# Patient Record
Sex: Male | Born: 1935 | Race: White | Hispanic: No | Marital: Married | Smoking: Former smoker
Health system: Southern US, Community
[De-identification: ages and names within clinical notes are randomized; demographics above are authoritative.]

## PROBLEM LIST (undated history)

## (undated) DIAGNOSIS — R55 Syncope and collapse: Secondary | ICD-10-CM

## (undated) DIAGNOSIS — G7000 Myasthenia gravis without (acute) exacerbation: Secondary | ICD-10-CM

## (undated) DIAGNOSIS — E669 Obesity, unspecified: Secondary | ICD-10-CM

## (undated) DIAGNOSIS — Z9289 Personal history of other medical treatment: Secondary | ICD-10-CM

## (undated) DIAGNOSIS — G4700 Insomnia, unspecified: Secondary | ICD-10-CM

## (undated) DIAGNOSIS — I10 Essential (primary) hypertension: Secondary | ICD-10-CM

## (undated) DIAGNOSIS — E785 Hyperlipidemia, unspecified: Secondary | ICD-10-CM

## (undated) DIAGNOSIS — I4819 Other persistent atrial fibrillation: Secondary | ICD-10-CM

## (undated) DIAGNOSIS — C61 Malignant neoplasm of prostate: Secondary | ICD-10-CM

## (undated) DIAGNOSIS — H409 Unspecified glaucoma: Secondary | ICD-10-CM

## (undated) DIAGNOSIS — I441 Atrioventricular block, second degree: Secondary | ICD-10-CM

## (undated) DIAGNOSIS — K219 Gastro-esophageal reflux disease without esophagitis: Secondary | ICD-10-CM

## (undated) DIAGNOSIS — N182 Chronic kidney disease, stage 2 (mild): Secondary | ICD-10-CM

## (undated) DIAGNOSIS — I5189 Other ill-defined heart diseases: Secondary | ICD-10-CM

## (undated) HISTORY — DX: Insomnia, unspecified: G47.00

## (undated) HISTORY — DX: Malignant neoplasm of prostate: C61

## (undated) HISTORY — DX: Gastro-esophageal reflux disease without esophagitis: K21.9

## (undated) HISTORY — DX: Hyperlipidemia, unspecified: E78.5

## (undated) HISTORY — DX: Chronic kidney disease, stage 2 (mild): N18.2

## (undated) HISTORY — DX: Other ill-defined heart diseases: I51.89

## (undated) HISTORY — DX: Other persistent atrial fibrillation: I48.19

## (undated) HISTORY — DX: Myasthenia gravis without (acute) exacerbation: G70.00

## (undated) HISTORY — DX: Essential (primary) hypertension: I10

## (undated) HISTORY — DX: Unspecified glaucoma: H40.9

## (undated) HISTORY — PX: TRANSURETHRAL RESECTION OF PROSTATE: SHX73

## (undated) HISTORY — PX: CATARACT EXTRACTION: SUR2

## (undated) HISTORY — DX: Obesity, unspecified: E66.9

## (undated) HISTORY — PX: CHOLECYSTECTOMY: SHX55

---

## 2004-05-08 ENCOUNTER — Ambulatory Visit: Payer: Self-pay | Admitting: Neurology

## 2004-06-02 ENCOUNTER — Inpatient Hospital Stay: Payer: Self-pay | Admitting: Neurology

## 2005-03-10 ENCOUNTER — Ambulatory Visit: Payer: Self-pay | Admitting: Neurology

## 2005-11-16 ENCOUNTER — Ambulatory Visit: Payer: Self-pay

## 2006-05-12 ENCOUNTER — Ambulatory Visit: Payer: Self-pay | Admitting: Neurology

## 2007-02-08 ENCOUNTER — Ambulatory Visit: Payer: Self-pay | Admitting: Urology

## 2007-02-10 ENCOUNTER — Ambulatory Visit: Payer: Self-pay | Admitting: Urology

## 2007-03-14 ENCOUNTER — Ambulatory Visit: Payer: Self-pay | Admitting: Urology

## 2007-06-06 ENCOUNTER — Other Ambulatory Visit: Payer: Self-pay

## 2007-06-06 ENCOUNTER — Emergency Department: Payer: Self-pay | Admitting: Emergency Medicine

## 2008-08-31 ENCOUNTER — Emergency Department: Payer: Self-pay | Admitting: Emergency Medicine

## 2009-04-24 ENCOUNTER — Ambulatory Visit: Payer: Self-pay | Admitting: Ophthalmology

## 2009-04-26 ENCOUNTER — Ambulatory Visit: Payer: Self-pay | Admitting: Family Medicine

## 2009-04-30 ENCOUNTER — Ambulatory Visit: Payer: Self-pay | Admitting: Ophthalmology

## 2012-09-08 ENCOUNTER — Encounter: Payer: Self-pay | Admitting: Neurology

## 2012-09-08 ENCOUNTER — Other Ambulatory Visit (INDEPENDENT_AMBULATORY_CARE_PROVIDER_SITE_OTHER): Payer: Self-pay | Admitting: Surgery

## 2012-09-08 ENCOUNTER — Ambulatory Visit (INDEPENDENT_AMBULATORY_CARE_PROVIDER_SITE_OTHER): Payer: Medicare Other | Admitting: Neurology

## 2012-09-08 ENCOUNTER — Encounter (INDEPENDENT_AMBULATORY_CARE_PROVIDER_SITE_OTHER): Payer: Self-pay | Admitting: Surgery

## 2012-09-08 ENCOUNTER — Ambulatory Visit (INDEPENDENT_AMBULATORY_CARE_PROVIDER_SITE_OTHER): Payer: Self-pay | Admitting: Surgery

## 2012-09-08 ENCOUNTER — Ambulatory Visit (INDEPENDENT_AMBULATORY_CARE_PROVIDER_SITE_OTHER): Payer: Medicare Other | Admitting: Surgery

## 2012-09-08 VITALS — BP 176/82 | HR 72 | Temp 97.6°F | Resp 16 | Ht 69.0 in | Wt 204.8 lb

## 2012-09-08 VITALS — BP 166/77 | HR 78 | Ht 66.5 in | Wt 205.0 lb

## 2012-09-08 DIAGNOSIS — R1011 Right upper quadrant pain: Secondary | ICD-10-CM

## 2012-09-08 DIAGNOSIS — G733 Myasthenic syndromes in other diseases classified elsewhere: Secondary | ICD-10-CM

## 2012-09-08 DIAGNOSIS — R1013 Epigastric pain: Secondary | ICD-10-CM

## 2012-09-08 DIAGNOSIS — G7000 Myasthenia gravis without (acute) exacerbation: Secondary | ICD-10-CM

## 2012-09-08 DIAGNOSIS — K859 Acute pancreatitis without necrosis or infection, unspecified: Secondary | ICD-10-CM

## 2012-09-08 HISTORY — DX: Myasthenia gravis without (acute) exacerbation: G70.00

## 2012-09-08 LAB — LIPASE: Lipase: 28 U/L (ref 0–75)

## 2012-09-08 LAB — COMPREHENSIVE METABOLIC PANEL
AST: 21 U/L (ref 0–37)
Albumin: 3.8 g/dL (ref 3.5–5.2)
BUN: 19 mg/dL (ref 6–23)
CO2: 26 mEq/L (ref 19–32)
Calcium: 9.5 mg/dL (ref 8.4–10.5)
Chloride: 105 mEq/L (ref 96–112)
Creat: 1.03 mg/dL (ref 0.50–1.35)
Glucose, Bld: 101 mg/dL — ABNORMAL HIGH (ref 70–99)
Potassium: 4.5 mEq/L (ref 3.5–5.3)

## 2012-09-08 LAB — CBC
MCH: 31.5 pg (ref 26.0–34.0)
MCHC: 33.8 g/dL (ref 30.0–36.0)
Platelets: 309 10*3/uL (ref 150–400)
RDW: 14.9 % (ref 11.5–15.5)

## 2012-09-08 MED ORDER — PREDNISONE 1 MG PO TABS: 4.0000 mg | ORAL_TABLET | Freq: Every day | ORAL | Status: DC

## 2012-09-08 NOTE — Progress Notes (Deleted)
Subjective:     Patient ID: Elijah Jackson, male   DOB: April 17, 1934, 78 y.o.   MRN: [id number]  HPI   Review of Systems     Objective:   Physical Exam     Assessment:     ***    Plan:     ***

## 2012-09-08 NOTE — Progress Notes (Signed)
Patient ID: Elijah Jackson, male   DOB: 1934/12/26, 78 y.o.   MRN: [id number]  Chief Complaint  Patient presents with  . New Evaluation    eval GB    HPI Elijah Jackson is a 78 y.o. male.  Self-referred from ED HPI This is a 78 year old male with myasthenia gravis who was recently on a trip in [place]. He developed severe epigastric abdominal pain. Reportedly, he was hospitalized for 4 days and they were never able to determine the etiology of his pain. We have some limited records from that hospital. Initially he did have a mildly elevated white blood cell count at 12.7. Hemoglobin was normal. Liver function tests were within normal limits. His lipase was measured at 118 which is elevated in our system but apparently at that hospital the higher end of normal is 300. Further lab studies show an amylase elevated at 149. A CT scan showed bilateral inguinal hernias containing only fat but no other abnormalities. Ultrasound showed no stones, wall thickening or pericholecystic fluid. His common bile duct was reportedly normal. The patient was not evaluated by a gastroenterologist during that admission. The pain spontaneously resolved. During the trip home, he did have some milder episodes of epigastric pain. However he has been asymptomatic over the last several weeks. I operated on his wife for her cholecystitis several years ago. She encouraged him to call me for an appointment.  Past Medical History  Diagnosis Date  . Ocular myasthenia gravis   . Obesity   . Hypertension   . Dyslipidemia   . GERD (gastroesophageal reflux disease)   . Prostate cancer   . Glaucoma   . Myasthenia gravis 09/08/2012    Past Surgical History  Procedure Laterality Date  . Transurethral resection of prostate    . Cataract extraction Left     Family History  Problem Relation Age of Onset  . Heart attack Father     Social History History  Substance Use Topics  . Smoking status: Former Smoker    Quit date:  04/06/1970  . Smokeless tobacco: Not on file  . Alcohol Use: Yes     Comment: Consumes alcohol on occasion    No Known Allergies  Current Outpatient Prescriptions  Medication Sig Dispense Refill  . aspirin 81 MG tablet Take 81 mg by mouth daily.      . Cholecalciferol (VITAMIN D-3) 1000 UNITS CAPS Take 1,000 Units by mouth daily.      Marland Kitchen latanoprost (XALATAN) 0.005 % ophthalmic solution Place 1 drop into both eyes at bedtime.      Marland Kitchen lisinopril (PRINIVIL,ZESTRIL) 20 MG tablet Take 20 mg by mouth daily.      . Multiple Vitamins-Minerals (CENTRUM SILVER ADULT 50+ PO) Take 1 tablet by mouth daily.      Marland Kitchen omeprazole (PRILOSEC) 20 MG capsule Take 20 mg by mouth daily.      . predniSONE (DELTASONE) 1 MG tablet Take 4 tablets (4 mg total) by mouth daily.  120 tablet  3  . rosuvastatin (CRESTOR) 5 MG tablet Take 5 mg by mouth daily.      Marland Kitchen zolpidem (AMBIEN) 10 MG tablet Take 10 mg by mouth at bedtime as needed for sleep.       No current facility-administered medications for this visit.    Review of Systems Review of Systems  Constitutional: Negative for fever, chills and unexpected weight change.  HENT: Negative for hearing loss, congestion, sore throat, trouble swallowing and voice change.   Eyes: Negative for  visual disturbance.  Respiratory: Negative for cough and wheezing.   Cardiovascular: Negative for chest pain, palpitations and leg swelling.  Gastrointestinal: Positive for nausea, abdominal pain, constipation and abdominal distention. Negative for vomiting, diarrhea, blood in stool, anal bleeding and rectal pain.  Genitourinary: Negative for hematuria and difficulty urinating.  Musculoskeletal: Negative for arthralgias.  Skin: Negative for rash and wound.  Neurological: Negative for seizures, syncope, weakness and headaches.  Hematological: Negative for adenopathy. Does not bruise/bleed easily.  Psychiatric/Behavioral: Negative for confusion.    Blood pressure 176/82, pulse 72,  temperature 97.6 F (36.4 C), temperature source Temporal, resp. rate 16, height 5\' 9"  (1.753 m), weight 204 lb 12.8 oz (92.897 kg).  Physical Exam Physical Exam WDWN in NAD HEENT:  EOMI, sclera anicteric Neck:  No masses, no thyromegaly Lungs:  CTA bilaterally; normal respiratory effort CV:  Regular rate and rhythm; no murmurs Abd:  +bowel sounds, soft, non-tender, no masses Ext:  Well-perfused; no edema Skin:  Warm, dry; no sign of jaundice  Data Reviewed Labs and imaging from [place] - will scan into EPIC  Assessment    Possible biliary pancreatitis - work-up is incomplete.     Plan    HIDA scan with EF to evaluate gallbladder and CBD emptying.  If unremarkable, will consider GI referral.  If positive, will proceed with laparoscopic cholecystectomy with intraoperative cholangiogram.  Reevaluate patient after HIDA scan and labs        Shanyn Preisler K. 09/08/2012, 5:56 PM

## 2012-09-08 NOTE — Patient Instructions (Signed)
Taper the prednisone by one tablet every month until off.

## 2012-09-08 NOTE — Progress Notes (Signed)
   Reason for visit: Ocular myasthenia gravis  Elijah Jackson is an 78 y.o. male  History of present illness:  Elijah Jackson is a 46 are old right-handed white male with a history ocular myasthenia gravis that dates back about 6 years. The patient done quite well on low-dose prednisone. The patient has been on 5 mg daily, and he reports that he recently was traveling in [place], and had to be hospitalized 2 weeks ago with severe diarrhea. The patient reports some problems with leg fatigue since that time. There is some indication that the fatigue may have been present before he got sick. The patient denies any ptosis, or double vision. The patient denies any problems with chewing or swallowing. The patient returns to this office for an evaluation.  Past Medical History  Diagnosis Date  . Ocular myasthenia gravis   . Obesity   . Hypertension   . Dyslipidemia   . GERD (gastroesophageal reflux disease)   . Prostate cancer   . Glaucoma   . Myasthenia gravis 09/08/2012    Past Surgical History  Procedure Laterality Date  . Transurethral resection of prostate    . Cataract extraction Left     Family History  Problem Relation Age of Onset  . Heart attack Father     Social history:  reports that he quit smoking about 42 years ago. He does not have any smokeless tobacco history on file. He reports that  drinks alcohol. He reports that he does not use illicit drugs.  Allergies: No Known Allergies  Medications:  No current outpatient prescriptions on file prior to visit.   No current facility-administered medications on file prior to visit.    ROS:  Out of a complete 14 system review of symptoms, the patient complains only of the following symptoms, and all other reviewed systems are negative.  Swelling the legs Fatigue  Blood pressure 166/77, pulse 78, height 5' 6.5" (1.689 m), weight 205 lb (92.987 kg).  Physical Exam  General: The patient is alert and cooperative at the time  of the examination. The patient is moderately obese.  Skin: 2+ edema at the ankles is noted bilaterally.   Neurologic Exam  Cranial nerves: Facial symmetry is present. Speech is normal, no aphasia or dysarthria is noted. Extraocular movements are full. Visual fields are full. The patient has no increased ptosis with superior gaze for 1 minute. No double vision is noted.  Motor: The patient has good strength in all 4 extremities. The patient has no fatigable weakness of the deltoid muscles with arms outstretched for 1 minute.  Coordination: The patient has good finger-nose-finger and heel-to-shin bilaterally.  Gait and station: The patient has a normal gait. Tandem gait is normal. Romberg is negative. No drift is seen.  Reflexes: Deep tendon reflexes are symmetric.   Assessment/Plan:  One. Ocular myasthenia gravis  The patient appears to be doing quite well at this time. We will continue the prednisone taper by 1 mg every month until he is off the medication. A prescription was given for the 1 mg prednisone tablets. The patient will followup in 6 months. If he believes that he is clinically getting worse, he is to contact our office before I see him back.  Elijah Palau MD 09/08/2012 8:16 PM  [facility] [address] [place], [place] [address]  Phone [phone] Fax [phone]

## 2012-09-09 ENCOUNTER — Encounter (INDEPENDENT_AMBULATORY_CARE_PROVIDER_SITE_OTHER): Payer: Self-pay

## 2012-09-20 ENCOUNTER — Telehealth: Payer: Self-pay | Admitting: *Deleted

## 2012-09-20 NOTE — Telephone Encounter (Signed)
I called patient. The patient indicates that a drop from 5 mg daily to 4 mg daily of prednisone has resulted in significant weakness of the lower extremities associated with numbness and tingling. The patient went back up on the prednisone taking 5 mg daily, and he now feels well. The patient will stay on the 5 mg dosing.

## 2012-09-20 NOTE — Telephone Encounter (Signed)
Message copied by Avie Echevaria on Tue Sep 20, 2012  2:33 PM ------      Message from: Anice Paganini      Created: Tue Sep 20, 2012  8:27 AM      Contact: Pt Elijah Jackson       Pt called needs Dr. Anne Hahn or nurse call him back about his medication (dosage) predniSONE (DELTASONE) 1 MG tablet                ------

## 2012-09-20 NOTE — Telephone Encounter (Signed)
Patient stated that he became extremely weak ("unable to pick myself up off the floor") on his increased dosage of Prednisone.  He is no longer taking 4 tablets daily and has gone back to 1 tablet daily.  He states that he feels much better now.  Please advise.

## 2012-09-20 NOTE — Telephone Encounter (Signed)
Returned patient's call regarding Prednisone.  Voice message left requesting a call-back.

## 2012-09-27 ENCOUNTER — Encounter (INDEPENDENT_AMBULATORY_CARE_PROVIDER_SITE_OTHER): Payer: Self-pay | Admitting: Surgery

## 2012-09-27 NOTE — Addendum Note (Signed)
Addended by: Marlowe Aschoff on: 09/27/2012 10:28 AM   Modules accepted: Orders

## 2012-09-28 ENCOUNTER — Telehealth (INDEPENDENT_AMBULATORY_CARE_PROVIDER_SITE_OTHER): Payer: Self-pay

## 2012-09-28 NOTE — Telephone Encounter (Signed)
LMOM asking that pt return my call before the end of the day - pt scheduled for hepato scan tomorrow at 7am. His insurance had been declined.  appt canceled. [facility] is appealing and will get appt rescheduled.

## 2012-09-29 ENCOUNTER — Encounter (HOSPITAL_COMMUNITY): Payer: Medicare Other

## 2012-10-06 ENCOUNTER — Telehealth (INDEPENDENT_AMBULATORY_CARE_PROVIDER_SITE_OTHER): Payer: Self-pay | Admitting: General Surgery

## 2012-10-06 NOTE — Telephone Encounter (Signed)
Patient's wife calling to check on the status of the appeal for patient's HIDA scan. Please advise. Wife can be reached back at [phone] or husband at [phone].

## 2012-10-10 ENCOUNTER — Encounter (INDEPENDENT_AMBULATORY_CARE_PROVIDER_SITE_OTHER): Payer: Self-pay | Admitting: Surgery

## 2012-10-10 ENCOUNTER — Other Ambulatory Visit (INDEPENDENT_AMBULATORY_CARE_PROVIDER_SITE_OTHER): Payer: Self-pay | Admitting: Surgery

## 2012-10-10 ENCOUNTER — Ambulatory Visit (INDEPENDENT_AMBULATORY_CARE_PROVIDER_SITE_OTHER): Payer: Medicare Other | Admitting: Surgery

## 2012-10-10 ENCOUNTER — Encounter (INDEPENDENT_AMBULATORY_CARE_PROVIDER_SITE_OTHER): Payer: Medicare Other | Admitting: Surgery

## 2012-10-10 VITALS — BP 127/83 | HR 68 | Temp 97.7°F | Resp 16 | Ht 69.0 in | Wt 202.0 lb

## 2012-10-10 DIAGNOSIS — R1011 Right upper quadrant pain: Secondary | ICD-10-CM

## 2012-10-10 DIAGNOSIS — R1013 Epigastric pain: Secondary | ICD-10-CM

## 2012-10-10 NOTE — Progress Notes (Signed)
The patient has not had any other severe attacks of epigastric or right upper quadrant abdominal pain. However he continues to have postprandial diarrhea and noticeable abdominal bloating. This seems to get worse with dairy products or whenever he goes out to eat.  Up to this point, we have been blocked by the insurance company in our efforts to schedule his HIDA scan.  His symptoms certainly fit the profile for gallbladder disease and his ultrasound from [place] was normal. We requested the HIDA scan in order to determine whether he had delayed gallbladder emptying with a decreased gallbladder ejection fraction.  We have made multiple attempts to try to appeal this to [place] [employer]. However to this point, they have denied approval for the HIDA scan. We will continue to try to appeal this decision. The other option would be to proceed with laparoscopic cholecystectomy.  Since the patient's symptoms are manageable at this time, I think it would be more prudent to reappeal [employer] company denial. He will try to maintain a low-fat diet. We will contact him after we have spoken with the insurance company again.  Elijah Arms. Corliss Skains, MD, [facility]  General/ Trauma Surgery  10/10/2012 11:26 AM

## 2012-10-19 ENCOUNTER — Telehealth (INDEPENDENT_AMBULATORY_CARE_PROVIDER_SITE_OTHER): Payer: Self-pay | Admitting: General Surgery

## 2012-10-19 ENCOUNTER — Encounter (HOSPITAL_COMMUNITY)
Admission: RE | Admit: 2012-10-19 | Discharge: 2012-10-19 | Disposition: A | Discharge status: Home / Self Care | Payer: Medicare Other | Source: Ambulatory Visit | Attending: Surgery | Admitting: Surgery

## 2012-10-19 DIAGNOSIS — R1013 Epigastric pain: Secondary | ICD-10-CM | POA: Insufficient documentation

## 2012-10-19 MED ORDER — TECHNETIUM TC 99M MEBROFENIN IV KIT
5.0000 | PACK | Freq: Once | INTRAVENOUS | Status: AC | PRN
  Administered 2012-10-19: 5 via INTRAVENOUS

## 2012-10-19 NOTE — Progress Notes (Signed)
Quick Note:  Please call the patient and let them know that their HIDA scan showed a gallbladder ejection fraction of 41%. Normal is 30%. Offer him an appointment in 2 weeks for reevaluation. We may still consider gallbladder surgery.  ______

## 2012-10-19 NOTE — Telephone Encounter (Signed)
LMOM to let patient that his hida scan showed a gallbladder ejection fraction of 41% normal is 30%. We may consider gallbladder surgery

## 2012-10-25 ENCOUNTER — Ambulatory Visit (INDEPENDENT_AMBULATORY_CARE_PROVIDER_SITE_OTHER): Payer: Medicare Other | Admitting: Surgery

## 2012-10-25 ENCOUNTER — Encounter (INDEPENDENT_AMBULATORY_CARE_PROVIDER_SITE_OTHER): Payer: Self-pay | Admitting: Surgery

## 2012-10-25 VITALS — BP 158/78 | HR 64 | Temp 98.0°F | Resp 16 | Ht 69.0 in | Wt 207.4 lb

## 2012-10-25 DIAGNOSIS — R1011 Right upper quadrant pain: Secondary | ICD-10-CM

## 2012-10-25 NOTE — Progress Notes (Signed)
The patient had his HIDA scan on 7/16.  This showed a gallbladder ejection fraction at 41% after drinking 8 ounces of Ensure. He did not have any recurrence of the symptoms with the oral intake. Overall he feels better. He denies any abdominal distention. He gets occasional twinges of right upper quadrant abdominal pain but this is not really related to food. His bowel movements have returned to normal. Overall he feels better than when he had his initial incident on vacation in [place].  Due to his normal findings on ultrasound and hydroscan, we will defer any surgery at this time. I would like to reevaluate him in 2 months to see if there's been any recurrence of symptoms.  He will call us sooner if his symptoms return or worsen.  Wilmon Arms. Corliss Skains, MD, [facility]  General/ Trauma Surgery  10/25/2012 10:53 AM

## 2012-12-20 ENCOUNTER — Other Ambulatory Visit: Payer: Self-pay

## 2012-12-20 NOTE — Telephone Encounter (Signed)
Dr Willis is out of the office, forwarding request to Dr Athar WID.  

## 2012-12-26 ENCOUNTER — Telehealth: Payer: Self-pay | Admitting: Neurology

## 2012-12-26 MED ORDER — TIZANIDINE HCL 2 MG PO TABS: 2.0000 mg | ORAL_TABLET | Freq: Four times a day (QID) | ORAL | Status: DC | PRN

## 2012-12-26 NOTE — Telephone Encounter (Signed)
Return pt call about having headaches and not being able to receive his sleep medication. Explain to the pt that Dr. Anne Hahn had not seen him for headaches and that he needed to follow up with his PCP. Pt stated that he did not have one and that Dr. Anne Hahn has filled his sleep medication before. Told the pt that i would inform Dr. Anne Hahn about his concerns.

## 2012-12-26 NOTE — Telephone Encounter (Signed)
I called patient. The patient is having some headaches in the left occipital area, left neck area. 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 The patient notes crepitus when turning his head to the right. The patient sees a chiropractor, and treatments will make the headache go away for about a day. The patient does occasionally have neck stiffness. The patient will be given a trial on tizanidine, and he will see his chiropractor on a regular basis, possibly considering cervical traction. If the headaches do not improve, the patient will contact me.

## 2012-12-27 ENCOUNTER — Other Ambulatory Visit: Payer: Self-pay

## 2012-12-27 MED ORDER — ZOLPIDEM TARTRATE 10 MG PO TABS: 10.0000 mg | ORAL_TABLET | Freq: Every evening | ORAL | Status: DC | PRN

## 2012-12-27 NOTE — Telephone Encounter (Signed)
Rx signed and faxed.

## 2012-12-29 ENCOUNTER — Encounter (INDEPENDENT_AMBULATORY_CARE_PROVIDER_SITE_OTHER): Payer: Self-pay | Admitting: Surgery

## 2012-12-29 ENCOUNTER — Ambulatory Visit (INDEPENDENT_AMBULATORY_CARE_PROVIDER_SITE_OTHER): Payer: Medicare Other | Admitting: Surgery

## 2012-12-29 VITALS — BP 154/84 | HR 65 | Temp 98.0°F | Resp 18 | Ht 69.0 in | Wt 205.0 lb

## 2012-12-29 DIAGNOSIS — K859 Acute pancreatitis without necrosis or infection, unspecified: Secondary | ICD-10-CM

## 2012-12-29 DIAGNOSIS — R1011 Right upper quadrant pain: Secondary | ICD-10-CM

## 2012-12-29 NOTE — Progress Notes (Signed)
Patient ID: Elijah Jackson, male   DOB: 02/11/1935, 78 y.o.   MRN: [id number]  Chief Complaint  Patient presents with  . Routine Post Op    G.B    HPI Elijah Jackson is a 78 y.o. male.    HPI This is a 78 year old male with myasthenia gravis who was recently on a trip in [place]. He developed severe epigastric abdominal pain. Reportedly, he was hospitalized for 4 days and they were never able to determine the etiology of his pain. We have some limited records from that hospital. Initially he did have a mildly elevated white blood cell count at 12.7. Hemoglobin was normal. Liver function tests were within normal limits. His lipase was measured at 118 which is elevated in our system but apparently at that hospital the higher end of normal is 300. Further lab studies show an amylase elevated at 149. A CT scan showed bilateral inguinal hernias containing only fat but no other abnormalities. Ultrasound showed no stones, wall thickening or pericholecystic fluid. His common bile duct was reportedly normal. The patient was not evaluated by a gastroenterologist during that admission. The pain spontaneously resolved. During the trip home, he did have some milder episodes of epigastric pain. However he has been asymptomatic over the last several weeks. I operated on his wife for her cholecystitis several years ago.  The patient had his HIDA scan on 7/16. This showed a gallbladder ejection fraction at 41% after drinking 8 ounces of Ensure. He did not have any recurrence of the symptoms with the oral intake. Overall he feels better. He denies any abdominal distention. He gets occasional twinges of right upper quadrant abdominal pain but this is not really related to food. His bowel movements have returned to normal. Overall he feels better than when he had his initial incident on vacation in [place].   Over the last couple of months, he has been having some mild symptoms of RUQ pain, especially after eating peanut  butter.  Mild nausea, no vomiting.  No diarrhea.   Past Medical History  Diagnosis Date  . Ocular myasthenia gravis   . Obesity   . Hypertension   . Dyslipidemia   . GERD (gastroesophageal reflux disease)   . Prostate cancer   . Glaucoma   . Myasthenia gravis 09/08/2012    Past Surgical History  Procedure Laterality Date  . Transurethral resection of prostate    . Cataract extraction Left     Family History  Problem Relation Age of Onset  . Heart attack Father     Social History History  Substance Use Topics  . Smoking status: Former Smoker    Quit date: 04/06/1970  . Smokeless tobacco: Not on file  . Alcohol Use: Yes     Comment: Consumes alcohol on occasion    No Known Allergies  Current Outpatient Prescriptions  Medication Sig Dispense Refill  . aspirin 81 MG tablet Take 81 mg by mouth daily.      . Cholecalciferol (VITAMIN D-3) 1000 UNITS CAPS Take 1,000 Units by mouth daily.      Marland Kitchen latanoprost (XALATAN) 0.005 % ophthalmic solution Place 1 drop into both eyes at bedtime.      Marland Kitchen lisinopril (PRINIVIL,ZESTRIL) 20 MG tablet Take 20 mg by mouth daily.      . Multiple Vitamins-Minerals (CENTRUM SILVER ADULT 50+ PO) Take 1 tablet by mouth daily.      Marland Kitchen omeprazole (PRILOSEC) 20 MG capsule Take 20 mg by mouth daily.      Marland Kitchen  predniSONE (DELTASONE) 5 MG tablet Take 5 mg by mouth daily.      . rosuvastatin (CRESTOR) 5 MG tablet Take 5 mg by mouth daily.      Marland Kitchen tiZANidine (ZANAFLEX) 2 MG tablet Take 1 tablet (2 mg total) by mouth every 6 (six) hours as needed.  60 tablet  1  . zolpidem (AMBIEN) 10 MG tablet Take 1 tablet (10 mg total) by mouth at bedtime as needed for sleep.  30 tablet  3   No current facility-administered medications for this visit.    Review of Systems Review of Systems  Constitutional: Negative for fever, chills and unexpected weight change.  HENT: Negative for hearing loss, congestion, sore throat, trouble swallowing and voice change.   Eyes:  Negative for visual disturbance.  Respiratory: Negative for cough and wheezing.   Cardiovascular: Negative for chest pain, palpitations and leg swelling.  Gastrointestinal: Positive for nausea, abdominal pain and abdominal distention. Negative for vomiting, diarrhea, constipation, blood in stool, anal bleeding and rectal pain.  Genitourinary: Negative for hematuria and difficulty urinating.  Musculoskeletal: Negative for arthralgias.  Skin: Negative for rash and wound.  Neurological: Negative for seizures, syncope, weakness and headaches.  Hematological: Negative for adenopathy. Does not bruise/bleed easily.  Psychiatric/Behavioral: Negative for confusion.    Blood pressure 154/84, pulse 65, temperature 98 F (36.7 C), resp. rate 18, height 5\' 9"  (1.753 m), weight 205 lb (92.987 kg).  Physical Exam Physical Exam WDWN in NAD HEENT:  EOMI, sclera anicteric Neck:  No masses, no thyromegaly Lungs:  CTA bilaterally; normal respiratory effort CV:  Regular rate and rhythm; no murmurs Abd:  +bowel sounds, soft, mild RUQ tenderness Ext:  Well-perfused; no edema Skin:  Warm, dry; no sign of jaundice  Data Reviewed RADIOLOGY REPORT*  Clinical Data: Epigastric abdominal pain.  NUCLEAR MEDICINE HEPATOBILIARY IMAGING WITH GALLBLADDER EF  Technique: Sequential images of the abdomen were obtained out to  60 minutes following intravenous administration of  radiopharmaceutical. After oral ingestion of 8 ounces of Ensure  plus, gallbladder ejection fraction was determined.  Radiopharmaceutical: 5.0 mCi Tc-23m Choletec  Comparison: None  Findings: There is symmetric uptake in the liver and prompt  excretion into the biliary tree which is visualized by 10 minutes.  The gallbladder is visualized at . Activity is noted in  the small bowel at 45 minutes.  The patient received 8 ounces of Ensure plus and a gallbladder  ejection fraction was estimated at 41.4. Normal is greater than  30%.   The patient did not experience symptoms after oral ingestion of  Ensure plus.  IMPRESSION:  1. Normal biliary patency study.  2. Low normal gallbladder ejection fraction.  Original Report Authenticated By: Rudie Meyer, M.D.   Assessment    Chronic cholecystitis - due to the patient's recurring symptoms and his history of severe pancreatitis, recommend surgery.     Plan    Laparoscopic cholecystectomy with intraoperative cholangiogram. The surgical procedure has been discussed with the patient.  Potential risks, benefits, alternative treatments, and expected outcomes have been explained.  All of the patient's questions at this time have been answered.  The likelihood of reaching the patient's treatment goal is good.  The patient understand the proposed surgical procedure and wishes to proceed.         Elijah Jackson K. 12/29/2012, 1:41 PM

## 2013-01-05 ENCOUNTER — Ambulatory Visit: Payer: Self-pay | Admitting: Ophthalmology

## 2013-01-09 ENCOUNTER — Other Ambulatory Visit (INDEPENDENT_AMBULATORY_CARE_PROVIDER_SITE_OTHER): Payer: Self-pay | Admitting: Surgery

## 2013-01-09 ENCOUNTER — Other Ambulatory Visit (INDEPENDENT_AMBULATORY_CARE_PROVIDER_SITE_OTHER): Payer: Self-pay | Admitting: *Deleted

## 2013-01-09 DIAGNOSIS — K811 Chronic cholecystitis: Secondary | ICD-10-CM

## 2013-01-09 MED ORDER — HYDROCODONE-ACETAMINOPHEN 5-325 MG PO TABS: 1.0000 | ORAL_TABLET | ORAL | Status: DC | PRN

## 2013-01-18 ENCOUNTER — Ambulatory Visit: Payer: Self-pay | Admitting: Ophthalmology

## 2013-01-25 ENCOUNTER — Ambulatory Visit (INDEPENDENT_AMBULATORY_CARE_PROVIDER_SITE_OTHER): Payer: Medicare Other | Admitting: Surgery

## 2013-01-25 ENCOUNTER — Encounter (INDEPENDENT_AMBULATORY_CARE_PROVIDER_SITE_OTHER): Payer: Self-pay | Admitting: Surgery

## 2013-01-25 VITALS — BP 130/78 | HR 74 | Temp 97.6°F | Resp 16 | Ht 69.0 in | Wt 201.6 lb

## 2013-01-25 DIAGNOSIS — K811 Chronic cholecystitis: Secondary | ICD-10-CM | POA: Insufficient documentation

## 2013-01-25 NOTE — Progress Notes (Signed)
Status post laparoscopic cholecystectomy with intraoperative cholangiogram on 01/09/13. The pathology confirmed chronic cholecystitis. The patient is doing fairly low. He does have occasional diarrhea but this seems to be improving. No nausea. Appetite has returned to normal.  Filed Vitals:   01/25/13 1544  BP: 130/78  Pulse: 74  Temp: 97.6 F (36.4 C)  Resp: 16     His incisions are all well-healed with no sign of infection. No abnormal tenderness. No sign of hernia.  The patient may resume regular diet and full activity. Followup as needed.  Wilmon Arms. Corliss Skains, MD, [facility]  General/ Trauma Surgery  01/25/2013 4:47 PM

## 2013-03-21 ENCOUNTER — Encounter: Payer: Self-pay | Admitting: Neurology

## 2013-03-21 ENCOUNTER — Ambulatory Visit (INDEPENDENT_AMBULATORY_CARE_PROVIDER_SITE_OTHER): Payer: Medicare Other | Admitting: Neurology

## 2013-03-21 ENCOUNTER — Encounter (INDEPENDENT_AMBULATORY_CARE_PROVIDER_SITE_OTHER): Payer: Self-pay

## 2013-03-21 VITALS — BP 160/80 | HR 70 | Ht 68.0 in | Wt 204.0 lb

## 2013-03-21 DIAGNOSIS — G7000 Myasthenia gravis without (acute) exacerbation: Secondary | ICD-10-CM

## 2013-03-21 NOTE — Patient Instructions (Signed)
Myasthenia Gravis Myasthenia gravis is a disease that causes muscle weakness throughout the body. The muscles affected are the ones we can control (voluntary muscles). An example of a voluntary muscle is your hand muscles. You can control the muscles to make the hand pick something up. An example of an involuntary muscle is the heart. The heart beats without any direction from you.  Myasthenia Gravis is thought to be an autoimmune disease. That means that normal defenses of the body begin to attack the body. In this case, the immune system begins to attack cells located at the junctions of the muscles and the nerves. Women are affected more often. Women are affected at a younger age than men. Babies born to affected women frequently develop symptoms at an early age. SYMPTOMS Initially in the disease, the facial muscles are affected first. After this, a person may develop droopy eyelids. They may have difficulty controlling facial muscles. They may have problems chewing. Swallowing and speaking may become impaired. The weakness gradually spreads to the arms and legs. It begins to affect breathing. Sometimes, the symptoms lessen or go away without any apparent cause. DIAGNOSIS  Diagnosis can be made with blood tests. Tests such as electromyography may be done to examine the electrical activity in the muscle. An improvement in symptoms after having an anti-cholinesterase drug helps confirm the diagnosis.  TREATMENT  Medicines are usually prescribed as the first treatment. These medicines help, but they do not cure the disease. A plasma cleansing procedure (plasmapheresis) can be used to treat a crisis. It can also be used to prepare a person for surgery. This procedure produces short-term improvement. Some cases are helped by removing the thymus gland. Steroids are used for short-term benefits. Document Released: 06/29/2000 Document Revised: 06/15/2011 Document Reviewed: 03/23/2005 [employer] Patient  Information 2014 [employer].  

## 2013-03-21 NOTE — Progress Notes (Signed)
Reason for visit: Myasthenia gravis  Elijah Jackson is an 78 y.o. male  History of present illness:  Elijah Jackson is a 78 year old right-handed white male with a history of myasthenia gravis primarily ocular features. The patient was to taper down on his prednisone, but at 4 mg a day, he began having some weakness of the left leg, and tingling down the left leg. The patient went back on the 5 milligram dose of the medication, and he has felt better. The patient still has some sensation of weakness and fatigue of the left leg. The patient denies any back pain. Since last seen, the patient has had a gallbladder resection. The patient denies any problems with chewing, swallowing, or breathing. The patient denies any double vision problems or ptosis. The patient returns to the office today for an evaluation. The patient does report some right-sided neck pain, occasional cervicogenic headache.  Past Medical History  Diagnosis Date  . Ocular myasthenia gravis   . Obesity   . Hypertension   . Dyslipidemia   . GERD (gastroesophageal reflux disease)   . Prostate cancer   . Glaucoma   . Myasthenia gravis 09/08/2012    Past Surgical History  Procedure Laterality Date  . Transurethral resection of prostate    . Cataract extraction Bilateral   . Cholecystectomy      Family History  Problem Relation Age of Onset  . Heart attack Father     Social history:  reports that he quit smoking about 42 years ago. He has never used smokeless tobacco. He reports that he drinks alcohol. He reports that he does not use illicit drugs.   No Known Allergies  Medications:  Current Outpatient Prescriptions on File Prior to Visit  Medication Sig Dispense Refill  . aspirin 81 MG tablet Take 81 mg by mouth daily.      . Cholecalciferol (VITAMIN D-3) 1000 UNITS CAPS Take 1,000 Units by mouth daily.      Marland Kitchen latanoprost (XALATAN) 0.005 % ophthalmic solution Place 1 drop into both eyes at bedtime.      Marland Kitchen  lisinopril (PRINIVIL,ZESTRIL) 20 MG tablet Take 20 mg by mouth daily.      . Multiple Vitamins-Minerals (CENTRUM SILVER ADULT 50+ PO) Take 1 tablet by mouth daily.      Marland Kitchen omeprazole (PRILOSEC) 20 MG capsule Take 20 mg by mouth daily.      . predniSONE (DELTASONE) 5 MG tablet Take 5 mg by mouth daily.      . rosuvastatin (CRESTOR) 5 MG tablet Take 5 mg by mouth daily.      Marland Kitchen tiZANidine (ZANAFLEX) 2 MG tablet Take 1 tablet (2 mg total) by mouth every 6 (six) hours as needed.  60 tablet  1  . zolpidem (AMBIEN) 10 MG tablet Take 1 tablet (10 mg total) by mouth at bedtime as needed for sleep.  30 tablet  3   No current facility-administered medications on file prior to visit.    ROS:  Out of a complete 14 system review of symptoms, the patient complains only of the following symptoms, and all other reviewed systems are negative.  Neck pain Left leg weakness  Blood pressure 160/80, pulse 70, height 5\' 8"  (1.727 m), weight 204 lb (92.534 kg).  Physical Exam  General: The patient is alert and cooperative at the time of the examination. The patient is minimally obese.  Skin: No significant peripheral edema is noted.   Neurologic Exam  Mental status: The patient is  oriented x 3.  Cranial nerves: Facial symmetry is present. Speech is normal, no aphasia or dysarthria is noted. Extraocular movements are full. Visual fields are full. With superior gaze for 1 minute, no ptosis or subjective reports of double vision is seen. No divergence of gaze is noted.  Motor: The patient has good strength in all 4 extremities. With the arms outstretched 1 minute and, there is no fatigable weakness of the deltoid muscles.  Sensory examination: Soft touch sensation on the hands and face is symmetric.  Coordination: The patient has good finger-nose-finger and heel-to-shin bilaterally.  Gait and station: The patient has a normal gait. Tandem gait is slightly unsteady. Romberg is negative. No drift is  seen.  Reflexes: Deep tendon reflexes are symmetric.   Assessment/Plan:  One. Myasthenia gravis, ocular  The patient doing quite well at this point on low-dose prednisone. We will continue at 5 mg dose, the patient will followup in 6-8 months.  Marlan Palau MD 03/21/2013 8:24 AM  [facility] [address] [place], [place] [address]  Phone [phone] Fax [phone]

## 2013-05-04 ENCOUNTER — Other Ambulatory Visit: Payer: Self-pay

## 2013-05-04 MED ORDER — PREDNISONE 5 MG PO TABS: 5.0000 mg | ORAL_TABLET | Freq: Every day | ORAL | Status: DC

## 2013-08-01 ENCOUNTER — Emergency Department: Payer: Self-pay | Admitting: Emergency Medicine

## 2013-08-01 ENCOUNTER — Telehealth: Payer: Self-pay | Admitting: Neurology

## 2013-08-01 LAB — CBC WITH DIFFERENTIAL/PLATELET
Basophil #: 0 10*3/uL (ref 0.0–0.1)
Basophil %: 0.4 %
EOS PCT: 1 %
Eosinophil #: 0.1 10*3/uL (ref 0.0–0.7)
HCT: 42.5 % (ref 40.0–52.0)
HGB: 14.5 g/dL (ref 13.0–18.0)
Lymphocyte #: 1.3 10*3/uL (ref 1.0–3.6)
Lymphocyte %: 19.2 %
MCH: 32.6 pg (ref 26.0–34.0)
MCHC: 34.2 g/dL (ref 32.0–36.0)
MCV: 95 fL (ref 80–100)
MONOS PCT: 8.8 %
Monocyte #: 0.6 x10 3/mm (ref 0.2–1.0)
NEUTROS PCT: 70.6 %
Neutrophil #: 4.6 10*3/uL (ref 1.4–6.5)
Platelet: 220 10*3/uL (ref 150–440)
RBC: 4.46 10*6/uL (ref 4.40–5.90)
RDW: 13.5 % (ref 11.5–14.5)
WBC: 6.6 10*3/uL (ref 3.8–10.6)

## 2013-08-01 LAB — BASIC METABOLIC PANEL
Anion Gap: 6 — ABNORMAL LOW (ref 7–16)
BUN: 17 mg/dL (ref 7–18)
CHLORIDE: 109 mmol/L — AB (ref 98–107)
CO2: 25 mmol/L (ref 21–32)
Calcium, Total: 9.1 mg/dL (ref 8.5–10.1)
Creatinine: 1.05 mg/dL (ref 0.60–1.30)
EGFR (African American): >60
EGFR (Non-African Amer.): >60
GLUCOSE: 116 mg/dL — AB (ref 65–99)
OSMOLALITY: 282 (ref 275–301)
Potassium: 4.1 mmol/L (ref 3.5–5.1)
Sodium: 140 mmol/L (ref 136–145)

## 2013-08-01 LAB — URINALYSIS, COMPLETE
BILIRUBIN, UR: NEGATIVE
Bacteria: NONE SEEN
Blood: NEGATIVE
GLUCOSE, UR: NEGATIVE mg/dL (ref 0–75)
KETONE: NEGATIVE
LEUKOCYTE ESTERASE: NEGATIVE
Nitrite: NEGATIVE
Ph: 6 (ref 4.5–8.0)
Protein: NEGATIVE
RBC,UR: 1 /HPF (ref 0–5)
SPECIFIC GRAVITY: 1.016 (ref 1.003–1.030)
Squamous Epithelial: NONE SEEN

## 2013-08-01 NOTE — Telephone Encounter (Signed)
Pt called states he is nausea and has dizziness really bad. States if he gets up he feels like he is going to pass out. Pt wants to see if Dr. Jannifer Franklin has anything today if not he's afraid he might have to go to the hospital it's that bad. I did set him up with CM tomorrow but he's afraid he might have to go to the hospital. Thanks

## 2013-08-01 NOTE — Telephone Encounter (Signed)
I called patient. The patient has had some problems with feeling generally fatigued. He had gallbladder surgery in the fall of 2014. The patient had some diarrhea, decreased appetite since that time, without weight loss. More recently, he has had dark stools, dizziness within the last week. The patient has no primary care physician, and gets his primary care through the [facility]. He will need blood work tomorrow we will get the CBC, liver profile. The patient may were require a gastroenterology evaluation.

## 2013-08-02 ENCOUNTER — Encounter: Payer: Self-pay | Admitting: Nurse Practitioner

## 2013-08-02 ENCOUNTER — Ambulatory Visit (INDEPENDENT_AMBULATORY_CARE_PROVIDER_SITE_OTHER): Payer: Medicare Other | Admitting: Nurse Practitioner

## 2013-08-02 VITALS — BP 128/70 | HR 75 | Ht 68.0 in | Wt 204.0 lb

## 2013-08-02 DIAGNOSIS — R42 Dizziness and giddiness: Secondary | ICD-10-CM | POA: Insufficient documentation

## 2013-08-02 DIAGNOSIS — G7000 Myasthenia gravis without (acute) exacerbation: Secondary | ICD-10-CM

## 2013-08-02 MED ORDER — PREDNISONE 5 MG PO TABS: 5.0000 mg | ORAL_TABLET | Freq: Every day | ORAL | Status: DC

## 2013-08-02 NOTE — Progress Notes (Signed)
[facility]  PATIENT: Elijah Jackson DOB: 05/06/1934   REASON FOR VISIT: Myasthenia gravis and dark stools and dizziness    HISTORY OF PRESENT ILLNESS: Elijah Jackson, 79 year old male returns for followup on an urgent basis. He called the office yesterday and spoke to Dr. Jannifer Franklin because he had some diarrhea, decreased appetite without weight loss since his gallbladder surgery in 2014 , more recently  he had had severe dizziness yesterday and dark stools, no frank blood according to patient. He gets his primary care through the [place]. He went to the emergency room at [facility] yesterday but left after 2 hours. He did get labs drawn and we will request those. Patient states that he went home, took an aspirin and his symptoms cleared. He has not had any dark stools today nor has he had any dizziness. He returns for reevaluation. He also has a history of myasthenia gravis which is stable. He is currently on prednisone 5 mg daily. He denies any double vision or ptosis. He denies any focal weakness    HISTORY: of myasthenia gravis primarily ocular features. The patient was to taper down on his prednisone, but at 4 mg a day, he began having some weakness of the left leg, and tingling down the left leg. The patient went back on the 5 milligram dose of the medication, and he has felt better. The patient still has some sensation of weakness and fatigue of the left leg. The patient denies any back pain. Since last seen, the patient has had a gallbladder resection. The patient denies any problems with chewing, swallowing, or breathing. The patient denies any double vision problems or ptosis. The patient returns to the office today for an evaluation. The patient does report some right-sided neck pain, occasional cervicogenic headache   REVIEW OF SYSTEMS: Full 14 system review of systems performed and notable only for those listed, all others are neg:  Constitutional: N/A    Cardiovascular: N/A  Ear/Nose/Throat: N/A  Skin: N/A  Eyes: N/A  Respiratory: N/A  Gastroitestinal: Black stools  Hematology/Lymphatic: N/A  Endocrine: N/A Musculoskeletal: Neck pain Allergy/Immunology: N/A  Neurological: Dizziness Psychiatric: N/A   ALLERGIES: No Known Allergies  HOME MEDICATIONS: Outpatient Prescriptions Prior to Visit  Medication Sig Dispense Refill  . aspirin 81 MG tablet Take 81 mg by mouth daily.      . Cholecalciferol (VITAMIN D-3) 1000 UNITS CAPS Take 1,000 Units by mouth daily.      Marland Kitchen latanoprost (XALATAN) 0.005 % ophthalmic solution Place 1 drop into both eyes at bedtime.      Marland Kitchen lisinopril (PRINIVIL,ZESTRIL) 20 MG tablet Take 20 mg by mouth daily.      . Multiple Vitamins-Minerals (CENTRUM SILVER ADULT 50+ PO) Take 1 tablet by mouth daily.      Marland Kitchen omeprazole (PRILOSEC) 20 MG capsule Take 20 mg by mouth daily.      . predniSONE (DELTASONE) 5 MG tablet Take 1 tablet (5 mg total) by mouth daily.  90 tablet  1  . rosuvastatin (CRESTOR) 5 MG tablet Take 5 mg by mouth daily.      Marland Kitchen tiZANidine (ZANAFLEX) 2 MG tablet Take 1 tablet (2 mg total) by mouth every 6 (six) hours as needed.  60 tablet  1  . zolpidem (AMBIEN) 10 MG tablet Take 1 tablet (10 mg total) by mouth at bedtime as needed for sleep.  30 tablet  3   No facility-administered medications prior to visit.    PAST MEDICAL HISTORY: Past  Medical History  Diagnosis Date  . Ocular myasthenia gravis   . Obesity   . Hypertension   . Dyslipidemia   . GERD (gastroesophageal reflux disease)   . Prostate cancer   . Glaucoma   . Myasthenia gravis 09/08/2012    PAST SURGICAL HISTORY: Past Surgical History  Procedure Laterality Date  . Transurethral resection of prostate    . Cataract extraction Bilateral   . Cholecystectomy      FAMILY HISTORY: Family History  Problem Relation Age of Onset  . Heart attack Father     SOCIAL HISTORY: History   Social History  . Marital Status: Married     Spouse Name: Crystal    Number of Children: 3  . Years of Education: 16   Occupational History  .      retired   Social History Main Topics  . Smoking status: Former Smoker    Quit date: 04/06/1970  . Smokeless tobacco: Never Used  . Alcohol Use: Yes     Comment: Consumes alcohol on occasion  . Drug Use: No  . Sexual Activity: Not on file   Other Topics Concern  . Not on file   Social History Narrative   Patient lives at home with his wife Veterinary surgeon)   Retired - [employer]   [place]   Right handed.   Caffeine- four cups daily.              PHYSICAL EXAM  Filed Vitals:   08/02/13 1346  BP: 128/70  Pulse: 75  Height: 5\' 8"  (1.727 m)  Weight: 204 lb (92.534 kg)   Body mass index is 31.03 kg/(m^2).  Generalized: Well developed, minimally obese male in no acute distress  Head: normocephalic and atraumatic,. Oropharynx benign  Neck: Supple, no carotid bruits  Cardiac: Regular rate rhythm, no murmur  Musculoskeletal: No deformity   Neurological examination   Mentation: Alert oriented to time, place, history taking. Follows all commands speech and language fluent  Cranial nerve II-XII: Fundoscopic exam reveals sharp disc margins.Pupils were equal round reactive to light extraocular movements were full, visual field were full on confrontational test. With superior gaze for 1 minute no ptosis or double vision. Facial sensation and strength were normal. hearing was intact to finger rubbing bilaterally. Uvula tongue midline. head turning and shoulder shrug were normal and symmetric.Tongue protrusion into cheek strength was normal. Motor: normal bulk and tone, full strength in the BUE, BLE,  No focal weakness with the arms outstretched for 1 minute in the deltoid muscles Coordination: finger-nose-finger, heel-to-shin bilaterally, no dysmetria Reflexes: Symmetric upper and lower ,plantar responses were flexor bilaterally. Gait and Station: Rising up from seated position  without assistance, normal stance,  moderate stride, good arm swing, smooth turning, able to perform tiptoe, and heel walking without difficulty. Tandem gait is mildly unsteady  DIAGNOSTIC DATA (LABS, IMAGING, TESTING) -   ASSESSMENT AND PLAN  79 y.o. year old male  has a past medical history of Ocular myasthenia gravis; and recent dark stools and dizziness. History of gallbladder surgery in the fall of 2014 with generalized fatigue diarrhea and decreased appetite since that time but no weight loss.  Discussed with Dr. Jannifer Franklin Labs reviewed from ER visit at [place], copy to patient, hemoglobin 14.5, BMP within normal limits urinalysis was negative. Needs followup at [facility] in the next several weeks Myasthenia gravis is stable, continue prednisone Followup in 6-8 months Dennie Bible, [facility], [facility], [facility] [phone]  [address] [place], [place] [address] [phone]

## 2013-08-02 NOTE — Progress Notes (Signed)
I have read the note, and I agree with the clinical assessment and plan.  Aneta Hendershott K Julieanne Hadsall   

## 2013-08-02 NOTE — Patient Instructions (Signed)
Labs reviewed from ER visit at [facility] ER copy to patient Myasthenia gravis is stable, continue prednisone Followup in 6-8 months

## 2013-08-08 ENCOUNTER — Other Ambulatory Visit: Payer: Self-pay

## 2013-08-08 MED ORDER — ZOLPIDEM TARTRATE 10 MG PO TABS: 10.0000 mg | ORAL_TABLET | Freq: Every evening | ORAL | Status: DC | PRN

## 2013-08-08 NOTE — Telephone Encounter (Signed)
Rx signed and faxed.

## 2013-09-14 ENCOUNTER — Encounter (INDEPENDENT_AMBULATORY_CARE_PROVIDER_SITE_OTHER): Payer: Self-pay | Admitting: Surgery

## 2013-09-14 ENCOUNTER — Ambulatory Visit (INDEPENDENT_AMBULATORY_CARE_PROVIDER_SITE_OTHER): Payer: Medicare Other | Admitting: Surgery

## 2013-09-14 VITALS — BP 140/85 | HR 66 | Temp 97.6°F | Resp 14 | Ht 69.0 in | Wt 198.0 lb

## 2013-09-14 DIAGNOSIS — R197 Diarrhea, unspecified: Secondary | ICD-10-CM

## 2013-09-14 DIAGNOSIS — K9189 Other postprocedural complications and disorders of digestive system: Secondary | ICD-10-CM

## 2013-09-14 DIAGNOSIS — T8189XA Other complications of procedures, not elsewhere classified, initial encounter: Secondary | ICD-10-CM

## 2013-09-14 DIAGNOSIS — Z9049 Acquired absence of other specified parts of digestive tract: Principal | ICD-10-CM

## 2013-09-14 MED ORDER — CHOLESTYRAMINE 4 G PO PACK: 4.0000 g | PACK | Freq: Three times a day (TID) | ORAL | Status: DC

## 2013-09-15 ENCOUNTER — Telehealth (INDEPENDENT_AMBULATORY_CARE_PROVIDER_SITE_OTHER): Payer: Self-pay | Admitting: General Surgery

## 2013-09-15 ENCOUNTER — Encounter (INDEPENDENT_AMBULATORY_CARE_PROVIDER_SITE_OTHER): Payer: Self-pay | Admitting: Surgery

## 2013-09-15 NOTE — Progress Notes (Signed)
The patient is status post laparoscopic cholecystectomy with cholangiogram in October of 2014.  He comes in today with several abdominal complaints. Every morning when he has breakfast, he has watery diarrhea shortly after eating. This does not occur with his other meals. The patient has a fairly poor appetite. His bowel movements initially were quite dark but have become lighter in color. He denies any abdominal pain. He had a recent CBC that showed a normal hemoglobin of 12.6.  The patient remains on prednisone to help manage his myasthenia gravis. His neurologist tried to decrease his dose of prednisone but he developed significant proximal lower extremity weakness and had to go back to 10 mg a day.  Filed Vitals:   09/14/13 1459  BP: 140/85  Pulse: 66  Temp: 97.6 F (36.4 C)  Resp: 14     His incisions are well-healed with no sign of infection. No abdominal tenderness. Skin shows no sign of jaundice No scleral icterus.  We will start the patient on a course of Questran to see if this helps with his postcholecystectomy diarrhea. He will call to let us know if there is any improvement. If there is no improvement, we will obtain liver function tests and possible abdominal imaging to make sure there are no problems after his surgery. He does not appear to have any significant blood in his stool with a normal hemoglobin. We will reevaluate after a few weeks on the Questran. He promised the keep [place] posted by telephone.  Imogene Burn. Georgette Dover, MD, [facility]  General/ Trauma Surgery  09/15/2013 9:04 AM

## 2013-09-15 NOTE — Telephone Encounter (Signed)
Message copied by Maryclare Bean on Fri Sep 15, 2013 12:15 PM ------      Message from: Joya San      Created: Fri Sep 15, 2013 11:06 AM      Contact: [phone]       The powder medicine he was prescribed gave him heartburn so bad he was up all night can you prescribe him something else. Please   ------

## 2013-09-15 NOTE — Telephone Encounter (Signed)
Called patient back and I told him what Dr Georgette Dover suggested that he try Maalox, the patient said that he will give the other medication another try. He stated that he used lemon juice last night and it helped

## 2014-01-07 ENCOUNTER — Emergency Department: Payer: Self-pay | Admitting: Internal Medicine

## 2014-01-07 LAB — URINALYSIS, COMPLETE
BILIRUBIN, UR: NEGATIVE
Bacteria: NONE SEEN
Blood: NEGATIVE
Glucose,UR: NEGATIVE mg/dL (ref 0–75)
Leukocyte Esterase: NEGATIVE
Nitrite: NEGATIVE
Ph: 5 (ref 4.5–8.0)
Protein: NEGATIVE
RBC,UR: 5 /HPF (ref 0–5)
SPECIFIC GRAVITY: 1.02 (ref 1.003–1.030)

## 2014-01-07 LAB — CBC WITH DIFFERENTIAL/PLATELET
BASOS PCT: 0.4 %
Basophil #: 0 10*3/uL (ref 0.0–0.1)
Eosinophil #: 0.1 10*3/uL (ref 0.0–0.7)
Eosinophil %: 0.9 %
HCT: 44 % (ref 40.0–52.0)
HGB: 14.4 g/dL (ref 13.0–18.0)
Lymphocyte #: 1.3 10*3/uL (ref 1.0–3.6)
Lymphocyte %: 16.5 %
MCH: 31.5 pg (ref 26.0–34.0)
MCHC: 32.8 g/dL (ref 32.0–36.0)
MCV: 96 fL (ref 80–100)
Monocyte #: 0.7 x10 3/mm (ref 0.2–1.0)
Monocyte %: 8.8 %
Neutrophil #: 5.6 10*3/uL (ref 1.4–6.5)
Neutrophil %: 73.4 %
Platelet: 205 10*3/uL (ref 150–440)
RBC: 4.57 10*6/uL (ref 4.40–5.90)
RDW: 12.7 % (ref 11.5–14.5)
WBC: 7.6 10*3/uL (ref 3.8–10.6)

## 2014-01-07 LAB — TROPONIN I
Troponin-I: <0.02 ng/mL
Troponin-I: <0.02 ng/mL

## 2014-01-07 LAB — COMPREHENSIVE METABOLIC PANEL
AST: 25 U/L (ref 15–37)
Albumin: 3.7 g/dL (ref 3.4–5.0)
Alkaline Phosphatase: 66 U/L
Anion Gap: 7 (ref 7–16)
BILIRUBIN TOTAL: 0.7 mg/dL (ref 0.2–1.0)
BUN: 22 mg/dL — AB (ref 7–18)
CO2: 25 mmol/L (ref 21–32)
Calcium, Total: 8.7 mg/dL (ref 8.5–10.1)
Chloride: 107 mmol/L (ref 98–107)
Creatinine: 1.09 mg/dL (ref 0.60–1.30)
GLUCOSE: 93 mg/dL (ref 65–99)
Osmolality: 281 (ref 275–301)
POTASSIUM: 3.8 mmol/L (ref 3.5–5.1)
SGPT (ALT): 25 U/L
Sodium: 139 mmol/L (ref 136–145)
TOTAL PROTEIN: 6.9 g/dL (ref 6.4–8.2)

## 2014-01-07 LAB — LIPASE, BLOOD: LIPASE: 127 U/L (ref 73–393)

## 2014-01-22 ENCOUNTER — Telehealth: Payer: Self-pay | Admitting: *Deleted

## 2014-01-22 NOTE — Telephone Encounter (Signed)
Patient called and stated that he was seen in the ER on 01/07/14 for abdominal pain and that he wanted to see Dr. Jannifer Franklin because he was sick and was having abdominal pain.  I advised the patient that he needed to go see a PCP or if the pain is severe enough, to return to the ER.  The patient stated that he wanted to be seen today and that Dr. Jannifer Franklin could be his PCP.  I explained to him that Dr. Jannifer Franklin is a Neuro. Spec. And that he was not a PCP.  The patient got upset and hung up on me.  I am forwarding to Dr. Jannifer Franklin to make him aware.

## 2014-01-22 NOTE — Telephone Encounter (Signed)
I called patient. The patient is having sharp abdominal pain that is improved with eating TUMS or goat milk. The patient is on Prilosec, he has had issues with reflux previously. The symptoms are worse first thing in the morning. He has had problems over the last 2-3 weeks. He is followed through the [facility], otherwise he does not have a primary care physician. I have asked him to increase the omeprazole taking 20 mg twice daily. He'll need a primary care physician.

## 2014-02-13 ENCOUNTER — Other Ambulatory Visit: Payer: Self-pay

## 2014-02-13 MED ORDER — PREDNISONE 5 MG PO TABS: 5.0000 mg | ORAL_TABLET | Freq: Every day | ORAL | Status: DC

## 2014-02-21 ENCOUNTER — Encounter: Payer: Self-pay | Admitting: Neurology

## 2014-02-27 ENCOUNTER — Encounter: Payer: Self-pay | Admitting: Neurology

## 2014-03-05 ENCOUNTER — Ambulatory Visit: Payer: Self-pay | Admitting: Gastroenterology

## 2014-03-12 ENCOUNTER — Other Ambulatory Visit: Payer: Self-pay

## 2014-03-12 MED ORDER — ZOLPIDEM TARTRATE 10 MG PO TABS: 10.0000 mg | ORAL_TABLET | Freq: Every evening | ORAL | Status: DC | PRN

## 2014-03-13 NOTE — Telephone Encounter (Signed)
Rx signed and faxed.

## 2014-03-21 ENCOUNTER — Ambulatory Visit: Payer: Medicare Other | Admitting: Neurology

## 2014-04-09 ENCOUNTER — Ambulatory Visit: Payer: Medicare Other | Admitting: Neurology

## 2014-05-03 ENCOUNTER — Ambulatory Visit: Payer: Self-pay | Admitting: Gastroenterology

## 2014-05-15 ENCOUNTER — Other Ambulatory Visit: Payer: Self-pay

## 2014-05-15 MED ORDER — PREDNISONE 5 MG PO TABS: 5.0000 mg | ORAL_TABLET | Freq: Every day | ORAL | Status: DC

## 2014-07-27 NOTE — Op Note (Signed)
PATIENT NAME:  Elijah Jackson, WOOLUM MR#:  [id number] DATE OF BIRTH:  21-Aug-1934  DATE OF PROCEDURE:  01/18/2013  PREOPERATIVE DIAGNOSIS: Visually significant cataract of the right eye.   POSTOPERATIVE DIAGNOSIS: Visually significant cataract of the right eye.   OPERATIVE PROCEDURE: Cataract extraction by phacoemulsification with implant of intraocular lens to right eye.   SURGEON: Birder Robson, MD.   ANESTHESIA:  1. Managed anesthesia care.  2. Topical tetracaine drops followed by 2% Xylocaine jelly applied in the preoperative holding area.   COMPLICATIONS: None.   TECHNIQUE:  Stop and chop.   DESCRIPTION OF PROCEDURE: The patient was examined and consented in the preoperative holding area where the aforementioned topical anesthesia was applied to the right eye and then brought back to the Operating Room where the right eye was prepped and draped in the usual sterile ophthalmic fashion and a lid speculum was placed. A paracentesis was created with the side port blade and the anterior chamber was filled with viscoelastic. A near clear corneal incision was performed with the steel keratome. A continuous curvilinear capsulorrhexis was performed with a cystotome followed by the capsulorrhexis forceps. Hydrodissection and hydrodelineation were carried out with BSS on a blunt cannula. The lens was removed in a stop and chop technique and the remaining cortical material was removed with the irrigation-aspiration handpiece. The capsular bag was inflated with viscoelastic and the Alcon SN60WF 18.5-diopter lens, serial number [id number] was placed in the capsular bag without complication. The remaining viscoelastic was removed from the eye with the irrigation-aspiration handpiece. The wounds were hydrated. The anterior chamber was flushed with Miostat and the eye was inflated to physiologic pressure. 0.1 mL of cefuroxime concentration 10 mg/mL was placed in the anterior chamber. The wounds were found to be  water tight. The eye was dressed with Vigamox. The patient was given protective glasses to wear throughout the day and a shield with which to sleep tonight. The patient was also given drops with which to begin a drop regimen today and will follow-up with me in one day.      ____________________________ Livingston Diones. Argie Lober, MD wlp:dmm D: 01/18/2013 17:11:03 ET T: 01/18/2013 19:51:27 ET JOB#: [id number]  cc: Mohit Zirbes L. Cabela Pacifico, MD, <Dictator> Livingston Diones Alithea Lapage MD ELECTRONICALLY SIGNED 01/23/2013 13:19

## 2014-07-30 LAB — SURGICAL PATHOLOGY

## 2014-09-17 ENCOUNTER — Telehealth: Payer: Self-pay

## 2014-09-17 NOTE — Telephone Encounter (Signed)
We have never prescribed that. He should be getting it from his neurologist for his myesthesia gravis. Thanks!

## 2014-09-17 NOTE — Telephone Encounter (Signed)
Called and Left a message notifying patient of Dr.Johnson's response.

## 2014-09-17 NOTE — Telephone Encounter (Signed)
Pharmacy is requesting a refill on Prednisone 5mg , I am unsure of why this patient takes this medication.

## 2014-09-20 ENCOUNTER — Ambulatory Visit: Payer: Self-pay | Admitting: Family Medicine

## 2016-04-02 ENCOUNTER — Observation Stay
Admission: EM | Admit: 2016-04-02 | Discharge: 2016-04-03 | Disposition: A | Discharge status: Home / Self Care | Payer: Medicare Other | Attending: Internal Medicine | Admitting: Internal Medicine

## 2016-04-02 DIAGNOSIS — R1084 Generalized abdominal pain: Secondary | ICD-10-CM

## 2016-04-02 DIAGNOSIS — Z7982 Long term (current) use of aspirin: Secondary | ICD-10-CM | POA: Insufficient documentation

## 2016-04-02 DIAGNOSIS — Z79899 Other long term (current) drug therapy: Secondary | ICD-10-CM | POA: Insufficient documentation

## 2016-04-02 DIAGNOSIS — E860 Dehydration: Secondary | ICD-10-CM | POA: Diagnosis not present

## 2016-04-02 DIAGNOSIS — K219 Gastro-esophageal reflux disease without esophagitis: Secondary | ICD-10-CM | POA: Insufficient documentation

## 2016-04-02 DIAGNOSIS — R112 Nausea with vomiting, unspecified: Secondary | ICD-10-CM

## 2016-04-02 DIAGNOSIS — K529 Noninfective gastroenteritis and colitis, unspecified: Secondary | ICD-10-CM | POA: Diagnosis not present

## 2016-04-02 DIAGNOSIS — E669 Obesity, unspecified: Secondary | ICD-10-CM | POA: Diagnosis not present

## 2016-04-02 DIAGNOSIS — G7000 Myasthenia gravis without (acute) exacerbation: Secondary | ICD-10-CM | POA: Insufficient documentation

## 2016-04-02 DIAGNOSIS — R197 Diarrhea, unspecified: Secondary | ICD-10-CM

## 2016-04-02 DIAGNOSIS — Z7952 Long term (current) use of systemic steroids: Secondary | ICD-10-CM | POA: Diagnosis not present

## 2016-04-02 DIAGNOSIS — N179 Acute kidney failure, unspecified: Secondary | ICD-10-CM | POA: Diagnosis present

## 2016-04-02 DIAGNOSIS — I951 Orthostatic hypotension: Secondary | ICD-10-CM | POA: Diagnosis not present

## 2016-04-02 DIAGNOSIS — Z6828 Body mass index (BMI) 28.0-28.9, adult: Secondary | ICD-10-CM | POA: Insufficient documentation

## 2016-04-02 DIAGNOSIS — H409 Unspecified glaucoma: Secondary | ICD-10-CM | POA: Diagnosis not present

## 2016-04-02 DIAGNOSIS — Z87891 Personal history of nicotine dependence: Secondary | ICD-10-CM | POA: Diagnosis not present

## 2016-04-02 DIAGNOSIS — Z8546 Personal history of malignant neoplasm of prostate: Secondary | ICD-10-CM | POA: Diagnosis not present

## 2016-04-02 LAB — C DIFFICILE QUICK SCREEN W PCR REFLEX
C DIFFICILE (CDIFF) INTERP: NOT DETECTED
C DIFFICILE (CDIFF) TOXIN: NEGATIVE
C DIFFICLE (CDIFF) ANTIGEN: NEGATIVE

## 2016-04-02 LAB — INFLUENZA PANEL BY PCR (TYPE A & B)
INFLAPCR: NEGATIVE
INFLBPCR: NEGATIVE

## 2016-04-02 LAB — COMPREHENSIVE METABOLIC PANEL
ALBUMIN: 4.2 g/dL (ref 3.5–5.0)
ALT: 21 U/L (ref 17–63)
ANION GAP: 7 (ref 5–15)
AST: 26 U/L (ref 15–41)
Alkaline Phosphatase: 65 U/L (ref 38–126)
BUN: 31 mg/dL — ABNORMAL HIGH (ref 6–20)
CO2: 25 mmol/L (ref 22–32)
Calcium: 9.4 mg/dL (ref 8.9–10.3)
Chloride: 107 mmol/L (ref 101–111)
Creatinine, Ser: 1.63 mg/dL — ABNORMAL HIGH (ref 0.61–1.24)
GFR calc non Af Amer: 38 mL/min — ABNORMAL LOW (ref >60)
GFR, EST AFRICAN AMERICAN: 45 mL/min — AB (ref >60)
GLUCOSE: 141 mg/dL — AB (ref 65–99)
POTASSIUM: 4.4 mmol/L (ref 3.5–5.1)
SODIUM: 139 mmol/L (ref 135–145)
Total Bilirubin: 1.1 mg/dL (ref 0.3–1.2)
Total Protein: 7.6 g/dL (ref 6.5–8.1)

## 2016-04-02 LAB — CBC WITH DIFFERENTIAL/PLATELET
BASOS PCT: 0 %
Basophils Absolute: 0 10*3/uL (ref 0–0.1)
Eosinophils Absolute: 0.1 10*3/uL (ref 0–0.7)
Eosinophils Relative: 1 %
HEMATOCRIT: 49.5 % (ref 40.0–52.0)
HEMOGLOBIN: 16.8 g/dL (ref 13.0–18.0)
LYMPHS ABS: 0.3 10*3/uL — AB (ref 1.0–3.6)
Lymphocytes Relative: 4 %
MCH: 32 pg (ref 26.0–34.0)
MCHC: 34 g/dL (ref 32.0–36.0)
MCV: 94.1 fL (ref 80.0–100.0)
MONOS PCT: 4 %
Monocytes Absolute: 0.3 10*3/uL (ref 0.2–1.0)
NEUTROS ABS: 8 10*3/uL — AB (ref 1.4–6.5)
NEUTROS PCT: 91 %
Platelets: 208 10*3/uL (ref 150–440)
RBC: 5.26 MIL/uL (ref 4.40–5.90)
RDW: 13.3 % (ref 11.5–14.5)
WBC: 8.8 10*3/uL (ref 3.8–10.6)

## 2016-04-02 LAB — LIPASE, BLOOD: Lipase: 33 U/L (ref 11–51)

## 2016-04-02 LAB — MAGNESIUM: MAGNESIUM: 2.2 mg/dL (ref 1.7–2.4)

## 2016-04-02 MED ORDER — METOCLOPRAMIDE HCL 5 MG/ML IJ SOLN
INTRAMUSCULAR | Status: AC
  Administered 2016-04-02: 10 mg via INTRAVENOUS
  Filled 2016-04-02: qty 2

## 2016-04-02 MED ORDER — ACETAMINOPHEN 325 MG PO TABS
650.0000 mg | ORAL_TABLET | Freq: Once | ORAL | Status: AC
  Administered 2016-04-02: 650 mg via ORAL

## 2016-04-02 MED ORDER — SODIUM CHLORIDE 0.9 % IV BOLUS (SEPSIS)
1000.0000 mL | Freq: Once | INTRAVENOUS | Status: AC
  Administered 2016-04-02: 1000 mL via INTRAVENOUS

## 2016-04-02 MED ORDER — ONDANSETRON 4 MG PO TBDP: 4.0000 mg | ORAL_TABLET | Freq: Three times a day (TID) | ORAL | 0 refills | Status: DC | PRN

## 2016-04-02 MED ORDER — ACETAMINOPHEN 325 MG PO TABS
ORAL_TABLET | ORAL | Status: AC
  Filled 2016-04-02: qty 2

## 2016-04-02 MED ORDER — ONDANSETRON HCL 4 MG/2ML IJ SOLN
4.0000 mg | Freq: Once | INTRAMUSCULAR | Status: AC
  Administered 2016-04-02: 4 mg via INTRAVENOUS
  Filled 2016-04-02: qty 2

## 2016-04-02 MED ORDER — HEPARIN SODIUM (PORCINE) 5000 UNIT/ML IJ SOLN
5000.0000 [IU] | Freq: Three times a day (TID) | INTRAMUSCULAR | Status: DC
  Administered 2016-04-02 – 2016-04-03 (×3): 5000 [IU] via SUBCUTANEOUS
  Filled 2016-04-02 (×3): qty 1

## 2016-04-02 MED ORDER — METOCLOPRAMIDE HCL 5 MG/ML IJ SOLN
10.0000 mg | Freq: Once | INTRAMUSCULAR | Status: AC
  Administered 2016-04-02: 10 mg via INTRAVENOUS

## 2016-04-02 MED ORDER — ACETAMINOPHEN 325 MG PO TABS: 650.0000 mg | ORAL_TABLET | Freq: Four times a day (QID) | ORAL | Status: DC | PRN

## 2016-04-02 MED ORDER — PANTOPRAZOLE SODIUM 40 MG PO TBEC
40.0000 mg | DELAYED_RELEASE_TABLET | Freq: Every day | ORAL | Status: DC
  Administered 2016-04-02 – 2016-04-03 (×2): 40 mg via ORAL
  Filled 2016-04-02 (×2): qty 1

## 2016-04-02 MED ORDER — PREDNISONE 10 MG PO TABS
5.0000 mg | ORAL_TABLET | Freq: Every day | ORAL | Status: DC
  Administered 2016-04-02: 5 mg via ORAL
  Filled 2016-04-02: qty 1

## 2016-04-02 MED ORDER — VITAMIN D 1000 UNITS PO TABS
1000.0000 [IU] | ORAL_TABLET | Freq: Every day | ORAL | Status: DC
  Administered 2016-04-02 – 2016-04-03 (×2): 1000 [IU] via ORAL
  Filled 2016-04-02 (×2): qty 1

## 2016-04-02 MED ORDER — LATANOPROST 0.005 % OP SOLN
1.0000 [drp] | Freq: Every day | OPHTHALMIC | Status: DC
  Administered 2016-04-02: 1 [drp] via OPHTHALMIC
  Filled 2016-04-02: qty 2.5

## 2016-04-02 MED ORDER — ALBUTEROL SULFATE (2.5 MG/3ML) 0.083% IN NEBU: 2.5000 mg | INHALATION_SOLUTION | RESPIRATORY_TRACT | Status: DC | PRN

## 2016-04-02 MED ORDER — ONDANSETRON HCL 4 MG/2ML IJ SOLN: 4.0000 mg | Freq: Four times a day (QID) | INTRAMUSCULAR | Status: DC | PRN

## 2016-04-02 MED ORDER — ONDANSETRON HCL 4 MG PO TABS: 4.0000 mg | ORAL_TABLET | Freq: Four times a day (QID) | ORAL | Status: DC | PRN

## 2016-04-02 MED ORDER — FAMOTIDINE 20 MG PO TABS: 20.0000 mg | ORAL_TABLET | Freq: Two times a day (BID) | ORAL | 0 refills | Status: DC

## 2016-04-02 MED ORDER — ACETAMINOPHEN 650 MG RE SUPP: 650.0000 mg | Freq: Four times a day (QID) | RECTAL | Status: DC | PRN

## 2016-04-02 MED ORDER — FAMOTIDINE IN NACL 20-0.9 MG/50ML-% IV SOLN
20.0000 mg | Freq: Once | INTRAVENOUS | Status: AC
  Administered 2016-04-02: 20 mg via INTRAVENOUS
  Filled 2016-04-02: qty 50

## 2016-04-02 MED ORDER — METOCLOPRAMIDE HCL 10 MG PO TABS: 10.0000 mg | ORAL_TABLET | Freq: Four times a day (QID) | ORAL | 0 refills | Status: DC | PRN

## 2016-04-02 MED ORDER — ASPIRIN EC 81 MG PO TBEC
81.0000 mg | DELAYED_RELEASE_TABLET | Freq: Every day | ORAL | Status: DC
  Administered 2016-04-02 – 2016-04-03 (×2): 81 mg via ORAL
  Filled 2016-04-02 (×2): qty 1

## 2016-04-02 MED ORDER — POTASSIUM CHLORIDE IN NACL 20-0.9 MEQ/L-% IV SOLN
INTRAVENOUS | Status: DC
  Administered 2016-04-02 – 2016-04-03 (×2): via INTRAVENOUS
  Filled 2016-04-02 (×4): qty 1000

## 2016-04-02 NOTE — ED Notes (Signed)
Pt girlfriend out to nurses station and vocalized that pt was shivering. Mateo Flow, RN in room to take patient another warm blanket. PT now in 5 warm blankets and still continues to state he is cold.

## 2016-04-02 NOTE — ED Provider Notes (Signed)
[facility] Emergency Department Provider Note  ____________________________________________  Time seen: Approximately 10:51 AM  I have reviewed the triage vital signs and the nursing notes.   HISTORY  Chief Complaint Nausea; Emesis; and Diarrhea    HPI Elijah Schmiesing Sr. is a 81 y.o. male who complains of nausea vomiting and diarrhea that started about midnight last night. He reports that he ate chili for dinner which was somewhat suspicious. Denies sick contacts. No body aches. No fever or chills. Denies abdominal pain. Vomiting and diarrhea were initially very frequent, and have since subsided although he still is having some nausea. He also reports that this morning he stood up out of bed and passed out briefly.     Past Medical History:  Diagnosis Date  . Dyslipidemia   . GERD (gastroesophageal reflux disease)   . Glaucoma   . Hyperlipidemia   . Hypertension   . Insomnia   . Myasthenia gravis ([place]) 09/08/2012  . Obesity   . Ocular myasthenia gravis ([place])   . Prostate cancer [facility])      Patient Active Problem List   Diagnosis Date Noted  . Postcholecystectomy diarrhea 09/14/2013  . Dizziness and giddiness 08/02/2013  . Chronic cholecystitis 01/25/2013  . Myasthenia gravis ([place]) 09/08/2012  . Abdominal pain, RUQ 09/08/2012  . Pancreatitis, acute 09/08/2012  . 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 CATARACT EXTRACTION Bilateral   . CHOLECYSTECTOMY    . TRANSURETHRAL RESECTION OF PROSTATE       Prior to Admission medications   Medication Sig Start Date End Date Taking? Authorizing Provider  aspirin 81 MG tablet Take 81 mg by mouth daily.    Historical Provider, MD  Cholecalciferol (VITAMIN D-3) 1000 UNITS CAPS Take 1,000 Units by mouth daily.    Historical Provider, MD  cholestyramine Lucrezia Starch) 4 G packet Take 1 packet (4 g total) by mouth 3 (three)  times daily with meals. 09/14/13   Donnie Mesa, MD  famotidine (PEPCID) 20 MG tablet Take 1 tablet (20 mg total) by mouth 2 (two) times daily. 04/02/16   Carrie Mew, MD  latanoprost (XALATAN) 0.005 % ophthalmic solution Place 1 drop into both eyes at bedtime.    Historical Provider, MD  lisinopril (PRINIVIL,ZESTRIL) 20 MG tablet Take 20 mg by mouth daily.    Historical Provider, MD  metoCLOPramide (REGLAN) 10 MG tablet Take 1 tablet (10 mg total) by mouth every 6 (six) hours as needed. 04/02/16   Carrie Mew, MD  Multiple Vitamins-Minerals (CENTRUM SILVER ADULT 50+ PO) Take 1 tablet by mouth daily.    Historical Provider, MD  omeprazole (PRILOSEC) 20 MG capsule Take 20 mg by mouth daily.    Historical Provider, MD  ondansetron (ZOFRAN ODT) 4 MG disintegrating tablet Take 1 tablet (4 mg total) by mouth every 8 (eight) hours as needed for nausea or vomiting. 04/02/16   Carrie Mew, MD  predniSONE (DELTASONE) 5 MG tablet Take 1 tablet (5 mg total) by mouth daily. 05/15/14   Kathrynn Ducking, MD  rosuvastatin (CRESTOR) 5 MG tablet Take 5 mg by mouth daily.    Historical Provider, MD  sucralfate (CARAFATE) 1 GM/10ML suspension Take 1 g by mouth 4 (four) times daily -  with meals and at bedtime.    Historical Provider, MD  tiZANidine (ZANAFLEX) 2 MG tablet Take 1 tablet (2 mg total) by mouth every 6 (six) hours as needed. 12/26/12   Juanda Crumble  Loreta Ave, MD  zolpidem (AMBIEN) 10 MG tablet Take 1 tablet (10 mg total) by mouth at bedtime as needed for sleep. 03/12/14   Kathrynn Ducking, MD     Allergies Patient has no known allergies.   Family History  Problem Relation Age of Onset  . Heart attack Father     Social History Social History  Substance Use Topics  . Smoking status: Former Smoker    Quit date: 04/06/1970  . Smokeless tobacco: Never Used  . Alcohol use 0.0 oz/week     Comment: Consumes alcohol on occasion    Review of Systems  Constitutional:   No fever or chills.   ENT:   No sore throat. No rhinorrhea. Cardiovascular:   No chest pain. Respiratory:   No dyspnea or cough. Gastrointestinal:   Negative for abdominal pain, Positive vomiting and diarrhea.  Genitourinary:   Negative for dysuria or difficulty urinating. Musculoskeletal:   Negative for focal pain or swelling Neurological:   Negative for headaches 10-point ROS otherwise negative.  ____________________________________________   PHYSICAL EXAM:  VITAL SIGNS: ED Triage Vitals [04/02/16 0942]  Enc Vitals Group     BP (!) 136/113     Pulse Rate 90     Resp 18     Temp 97.8 F (36.6 C)     Temp Source Oral     SpO2 96 %     Weight 190 lb (86.2 kg)     Height 5\' 9"  (1.753 m)     Head Circumference      Peak Flow      Pain Score      Pain Loc      Pain Edu?      Excl. in [place]?     Vital signs reviewed, nursing assessments reviewed.   Constitutional:   Alert and oriented. Well appearing and in no distress. Eyes:   No scleral icterus. No conjunctival pallor. PERRL. EOMI.  No nystagmus. ENT   Head:   Normocephalic and atraumatic.   Nose:   No congestion/rhinnorhea. No septal hematoma   Mouth/Throat:   Dry mucous membranes, no pharyngeal erythema. No peritonsillar mass.    Neck:   No stridor. No SubQ emphysema. No meningismus. Hematological/Lymphatic/Immunilogical:   No cervical lymphadenopathy. Cardiovascular:   RRR. Symmetric bilateral radial and DP pulses.  No murmurs.  Respiratory:   Normal respiratory effort without tachypnea nor retractions. Breath sounds are clear and equal bilaterally. No wheezes/rales/rhonchi. Gastrointestinal:   Soft and nontender. Mildly distended without tympany. There is no CVA tenderness.  No rebound, rigidity, or guarding. Genitourinary:   deferred Musculoskeletal:   Nontender with normal range of motion in all extremities. No joint effusions.  No lower extremity tenderness.  No edema. Neurologic:   Normal speech and language.  CN 2-10  normal. Motor grossly intact. No gross focal neurologic deficits are appreciated.  Skin:    Skin is warm, dry and intact. No rash noted.  No petechiae, purpura, or bullae. Poor skin turgor  ____________________________________________    LABS (pertinent positives/negatives) (all labs ordered are listed, but only abnormal results are displayed) Labs Reviewed  COMPREHENSIVE METABOLIC PANEL - Abnormal; Notable for the following:       Result Value   Glucose, Bld 141 (*)    BUN 31 (*)    Creatinine, Ser 1.63 (*)    GFR calc non Af Amer 38 (*)    GFR calc Af Amer 45 (*)    All other components within normal limits  CBC WITH DIFFERENTIAL/PLATELET - Abnormal; Notable for the following:    Neutro Abs 8.0 (*)    Lymphs Abs 0.3 (*)    All other components within normal limits  LIPASE, BLOOD  URINALYSIS, COMPLETE (UACMP) WITH MICROSCOPIC  INFLUENZA PANEL BY PCR (TYPE A & B, H1N1)   ____________________________________________   EKG    ____________________________________________    RADIOLOGY    ____________________________________________   PROCEDURES Procedures  ____________________________________________   INITIAL IMPRESSION / ASSESSMENT AND PLAN / ED COURSE  Pertinent labs & imaging results that were available during my care of the patient were reviewed by me and considered in my medical decision making (see chart for details).  Patient presents with nausea vomiting diarrhea, food borne illness versus gastroenteritis. His symptoms are entirely consistent with recent viral syndrome and influenza type a infections that we've been seeing in this community.Considering the patient's symptoms, medical history, and physical examination today, I have low suspicion for cholecystitis or biliary pathology, pancreatitis, perforation or bowel obstruction, hernia, intra-abdominal abscess, AAA or dissection, volvulus or intussusception, mesenteric ischemia, or appendicitis.  Treat  symptomatically with IV fluids for hydration, Zofran and Reglan and acid suppression. We'll reassess with orthostatic vital signs and trial of oral tolerance.   ----------------------------------------- 12:59 PM on 04/02/2016 -----------------------------------------  Patient reports feeling improved after IV fluids and antiemetics. Vital signs unremarkable except HR 110 while supine. Labs reveal increased creatinine consistent with acute renal insufficiency secondary to dehydration. With these findings, dehydration and tachycardia and fever, I think this is viral gastroenteritis.  I offered the patient admission to the hospital. He would prefer to go home, but is not sure if he will be dizzy when he stands up still. We'll check orthostatics and reassess symptoms for disposition.    ----------------------------------------- 1:19 PM on 04/02/2016 -----------------------------------------  On sitting upright in bed, blood pressure dropped to about 85/50. Patient became dizzy. We'll discussed with hospitalist for admission.    Clinical Course    ____________________________________________   FINAL CLINICAL IMPRESSION(S) / ED DIAGNOSES  Final diagnoses:  Nausea vomiting and diarrhea  Gastroenteritis  Generalized abdominal pain  Orthostatic hypotension      New Prescriptions   FAMOTIDINE (PEPCID) 20 MG TABLET    Take 1 tablet (20 mg total) by mouth 2 (two) times daily.   METOCLOPRAMIDE (REGLAN) 10 MG TABLET    Take 1 tablet (10 mg total) by mouth every 6 (six) hours as needed.   ONDANSETRON (ZOFRAN ODT) 4 MG DISINTEGRATING TABLET    Take 1 tablet (4 mg total) by mouth every 8 (eight) hours as needed for nausea or vomiting.     Portions of this note were generated with dragon dictation software. Dictation errors may occur despite best attempts at proofreading.    Carrie Mew, MD 04/02/16 1321

## 2016-04-02 NOTE — ED Notes (Signed)
Pt states that he is cold, pt is currently covered in 3 warm blankets. Turned heat up in room to highest amount possible, Currently 69F in room. Pt continues to shiver, recheck temperature and it is normal. Got patient another blanket to cover head and shoulders.

## 2016-04-02 NOTE — ED Notes (Addendum)
Pt visitor (who he reports is his girlfriend), in room with him at this time.

## 2016-04-02 NOTE — ED Notes (Addendum)
Pt girlfriend to nurses station asking why monitor is beeping. Informed that oxygen sensor probe is not reading correctly due to uneven waveform and that I am coming to replace. Into room and replaced oxygen sensor, oxygen reading 94% at this time. Nausea medication administered and head of bead lowered per patient request. ED Tech, Felicia at bedside to ask if patient family needed any drink while waiting, denies. Pt denies further needs.    Pt head lowered per his request. Clean emesis bag in stretcher with patient. No emesis or diarrhea since arrival to ER.

## 2016-04-02 NOTE — ED Triage Notes (Addendum)
NVD that started 04/02/16 at 12AM. Pt received 4mg  zofran IV with EMS. 20 G to L hand established by EMS. CBG 157.  Pt alert and oriented X4, able to answer questions about medical hx, allergies. No reports of incontinence, pt clean and dry upon arrival.   Side rails up X2, call bell on side rail.   Denies CP, abdominal pain or SOB. Pt denies all pain. Reports feeling nauseated.

## 2016-04-02 NOTE — ED Notes (Signed)
ED Provider at bedside. 

## 2016-04-02 NOTE — H&P (Addendum)
[facility] [place] NAME: Elijah Jackson    MR#:  [id number]  DATE OF BIRTH:  11-15-1934  DATE OF ADMISSION:  04/02/2016  PRIMARY CARE PHYSICIAN: No PCP Per Patient   REQUESTING/REFERRING PHYSICIAN: Carrie Mew, MD  CHIEF COMPLAINT:   Chief Complaint  Patient presents with  . Nausea  . Emesis  . Diarrhea   Nausea, vomiting and diarrhea 1 day. HISTORY OF PRESENT ILLNESS:  Elijah Jackson  is a 81 y.o. male with a known history of Hypertension, hyperlipidemia Myasthenia gravis and GERD. The patient to present to the ED with nausea, vomiting and diarrhea multiple times since yesterday. The patient ate chili in the restaurant last night, which is suspicious. His wife ate dinner without chili. He also developed a fever and chills. He had diarrhea 2 episodes in the ED just now. His wife said he passed out for 1 minute while sitting at home. He is found hypotension at 80s and HR at 110. His creatinine increased to 1.63 and BUN increased to 31. He has no recent hospitalization history but went to [place] clinic 3 weeks ago.  PAST MEDICAL HISTORY:   Past Medical History:  Diagnosis Date  . Dyslipidemia   . GERD (gastroesophageal reflux disease)   . Glaucoma   . Hyperlipidemia   . Hypertension   . Insomnia   . Myasthenia gravis ([place]) 09/08/2012  . Obesity   . Ocular myasthenia gravis ([place])   . Prostate cancer ([place])     PAST SURGICAL HISTORY:   Past Surgical History:  Procedure Laterality Date  . CATARACT EXTRACTION Bilateral   . CHOLECYSTECTOMY    . TRANSURETHRAL RESECTION OF PROSTATE      SOCIAL HISTORY:   Social History  Substance Use Topics  . Smoking status: Former Smoker    Quit date: 04/06/1970  . Smokeless tobacco: Never Used  . Alcohol use 0.0 oz/week     Comment: Consumes alcohol on occasion    FAMILY HISTORY:   Family History  Problem Relation Age of Onset  . Heart attack Father     DRUG ALLERGIES:  No Known  Allergies  REVIEW OF SYSTEMS:   Review of Systems  Constitutional: Positive for chills, fever and malaise/fatigue.  HENT: Negative for congestion.   Eyes: Negative for blurred vision and double vision.  Respiratory: Negative for cough, shortness of breath and stridor.   Cardiovascular: Negative for chest pain and leg swelling.  Gastrointestinal: Positive for diarrhea, nausea and vomiting. Negative for abdominal pain, blood in stool and melena.  Genitourinary: Negative for dysuria, flank pain and hematuria.  Musculoskeletal: Negative for joint pain.  Neurological: Positive for weakness. Negative for dizziness and focal weakness.  Psychiatric/Behavioral: Negative for depression. The patient is not nervous/anxious.     MEDICATIONS AT HOME:   Prior to Admission medications   Medication Sig Start Date End Date Taking? Authorizing Provider  aspirin 81 MG tablet Take 81 mg by mouth daily.   Yes Historical Provider, MD  Cholecalciferol (VITAMIN D-3) 1000 UNITS CAPS Take 1,000 Units by mouth daily.   Yes Historical Provider, MD  latanoprost (XALATAN) 0.005 % ophthalmic solution Place 1 drop into both eyes at bedtime.   Yes Historical Provider, MD  lisinopril (PRINIVIL,ZESTRIL) 20 MG tablet Take 20 mg by mouth daily.   Yes Historical Provider, MD  omeprazole (PRILOSEC) 20 MG capsule Take 20 mg by mouth daily.   Yes Historical Provider, MD  predniSONE (DELTASONE) 5 MG tablet Take  1 tablet (5 mg total) by mouth daily. 05/15/14  Yes Kathrynn Ducking, MD  cholestyramine Lucrezia Starch) 4 G packet Take 1 packet (4 g total) by mouth 3 (three) times daily with meals. Patient not taking: Reported on 04/02/2016 09/14/13   Donnie Mesa, MD  famotidine (PEPCID) 20 MG tablet Take 1 tablet (20 mg total) by mouth 2 (two) times daily. 04/02/16   Carrie Mew, MD  metoCLOPramide (REGLAN) 10 MG tablet Take 1 tablet (10 mg total) by mouth every 6 (six) hours as needed. 04/02/16   Carrie Mew, MD  ondansetron  (ZOFRAN ODT) 4 MG disintegrating tablet Take 1 tablet (4 mg total) by mouth every 8 (eight) hours as needed for nausea or vomiting. 04/02/16   Carrie Mew, MD  tiZANidine (ZANAFLEX) 2 MG tablet Take 1 tablet (2 mg total) by mouth every 6 (six) hours as needed. Patient not taking: Reported on 04/02/2016 12/26/12   Kathrynn Ducking, MD  zolpidem (AMBIEN) 10 MG tablet Take 1 tablet (10 mg total) by mouth at bedtime as needed for sleep. Patient not taking: Reported on 04/02/2016 03/12/14   Kathrynn Ducking, MD      VITAL SIGNS:  Blood pressure (!) 119/50, pulse 99, temperature 99.2 F (37.3 C), temperature source Oral, resp. rate 18, height 5\' 9"  ([id number] m), weight 190 lb (86.2 kg), SpO2 94 %.  PHYSICAL EXAMINATION:  Physical Exam  Constitutional: He is oriented to person, place, and time and well-developed, well-nourished, and in no distress.  HENT:  Head: Normocephalic.  Mouth/Throat: Oropharynx is clear and moist.  Eyes: Conjunctivae and EOM are normal.  Neck: Normal range of motion. Neck supple. No JVD present. No tracheal deviation present.  Cardiovascular: Normal rate, regular rhythm and normal heart sounds.  Exam reveals no gallop.   No murmur heard. Pulmonary/Chest: Effort normal and breath sounds normal. No respiratory distress. He has no wheezes. He has no rales.  Abdominal: Soft. Bowel sounds are normal. He exhibits no distension. There is no tenderness.  Musculoskeletal: Normal range of motion. He exhibits no edema or tenderness.  Neurological: He is alert and oriented to person, place, and time. No cranial nerve deficit.  Skin: No rash noted. No erythema.  Psychiatric: Affect and judgment normal.    LABORATORY PANEL:   CBC  Recent Labs Lab 04/02/16 0944  WBC 8.8  HGB 16.8  HCT 49.5  PLT 208   ------------------------------------------------------------------------------------------------------------------  Chemistries   Recent Labs Lab 04/02/16 0944  NA 139   K 4.4  CL 107  CO2 25  GLUCOSE 141*  BUN 31*  CREATININE 1.63*  CALCIUM 9.4  AST 26  ALT 21  ALKPHOS 65  BILITOT 1.1   ------------------------------------------------------------------------------------------------------------------  Cardiac Enzymes No results for input(s): TROPONINI in the last 168 hours. ------------------------------------------------------------------------------------------------------------------  RADIOLOGY:  No results found.    IMPRESSION AND PLAN:   Acute gastroenteritis The patient will be placed above the patient. Zofran when necessary, IV fluids to support.  Acute renal failure due to dehydration. Start normal saline IV. His potassium and follow-up BMP. Hold lisinopril.  Hypotension and Orthostatic hypotension. Continue IV fluid support, monitor vital signs closely.  Syncope. Due to hypotension and dehydration. Aspiration and fall precaution. IV fluid support.  All the records are reviewed and case discussed with ED provider. Management plans discussed with the patient,his wife and they are in agreement.  CODE STATUS: full code.  TOTAL TIME TAKING CARE OF THIS PATIENT: 56 minutes.    Elijah Jackson M.D on 04/02/2016  at 1:47 PM  Between 7am to 6pm - Pager - [phone]  After 6pm go to [email or web address] - [employer] [place] [facility]  Office  [phone]  CC: Primary care physician; No PCP Per Patient   Note: This dictation was prepared with Dragon dictation along with smaller phrase technology. Any transcriptional errors that result from this process are unintentional.

## 2016-04-02 NOTE — ED Notes (Signed)
Dr. Chen at bedside.

## 2016-04-02 NOTE — ED Notes (Signed)
Orthostatic vital signs held at this time.

## 2016-04-02 NOTE — ED Notes (Signed)
Nurse Radonna Ricker in room with patient giving meds , family in room as well comfortable much as possible

## 2016-04-02 NOTE — ED Notes (Signed)
Patient tolerating sips of water.

## 2016-04-02 NOTE — ED Notes (Signed)
Patient was given sips of water with Tylenol. No vomiting noted at this time.

## 2016-04-02 NOTE — ED Notes (Signed)
Pt given warm blankets.

## 2016-04-02 NOTE — ED Notes (Signed)
Pt girlfriend out to nurses station asking for more blankets, this RN in to room to give 2 more blankets. Pt states that he is still nauseated. Dr. Joni Fears informed and verbal orders received.

## 2016-04-02 NOTE — ED Notes (Signed)
Report to Sonja, RN 

## 2016-04-02 NOTE — ED Notes (Signed)
Patient was shivering, had goosebumps. Patient had been incontinent to stool which was watery, brown. Patient was cleaned up, linens were changed and patient was placed in a clean gown. Fluids are infusing. Patient's abdomen is soft, skin is mottled, warm to the touch. Rectal temp is 100.4. Dr. Joni Fears is aware.

## 2016-04-02 NOTE — ED Notes (Signed)
Patient's diaper was changed. Urine only. Patient repositioned on the stretcher. Patient instructed to use call bell if urinal is needed.

## 2016-04-03 LAB — CBC
HEMATOCRIT: 38.6 % — AB (ref 40.0–52.0)
Hemoglobin: 13.7 g/dL (ref 13.0–18.0)
MCH: 32.6 pg (ref 26.0–34.0)
MCHC: 35.5 g/dL (ref 32.0–36.0)
MCV: 91.9 fL (ref 80.0–100.0)
Platelets: 171 10*3/uL (ref 150–440)
RBC: 4.2 MIL/uL — ABNORMAL LOW (ref 4.40–5.90)
RDW: 13.4 % (ref 11.5–14.5)
WBC: 9.5 10*3/uL (ref 3.8–10.6)

## 2016-04-03 LAB — URINALYSIS, COMPLETE (UACMP) WITH MICROSCOPIC
BACTERIA UA: NONE SEEN
Bilirubin Urine: NEGATIVE
Glucose, UA: NEGATIVE mg/dL
Ketones, ur: NEGATIVE mg/dL
Leukocytes, UA: NEGATIVE
Nitrite: NEGATIVE
PROTEIN: NEGATIVE mg/dL
Specific Gravity, Urine: 1.023 (ref 1.005–1.030)
pH: 5 (ref 5.0–8.0)

## 2016-04-03 LAB — BASIC METABOLIC PANEL
Anion gap: 5 (ref 5–15)
BUN: 33 mg/dL — AB (ref 6–20)
CO2: 21 mmol/L — ABNORMAL LOW (ref 22–32)
Calcium: 7.8 mg/dL — ABNORMAL LOW (ref 8.9–10.3)
Chloride: 112 mmol/L — ABNORMAL HIGH (ref 101–111)
Creatinine, Ser: 1.21 mg/dL (ref 0.61–1.24)
GFR calc Af Amer: >60 mL/min (ref >60)
GFR, EST NON AFRICAN AMERICAN: 55 mL/min — AB (ref >60)
GLUCOSE: 116 mg/dL — AB (ref 65–99)
POTASSIUM: 4.2 mmol/L (ref 3.5–5.1)
Sodium: 138 mmol/L (ref 135–145)

## 2016-04-03 MED ORDER — PREDNISONE 1 MG PO TABS: 1.0000 mg | ORAL_TABLET | Freq: Every day | ORAL | Status: DC

## 2016-04-03 MED ORDER — ONDANSETRON 4 MG PO TBDP: 4.0000 mg | ORAL_TABLET | Freq: Three times a day (TID) | ORAL | 0 refills | Status: DC | PRN

## 2016-04-03 MED ORDER — PREDNISONE 1 MG PO TABS
1.0000 mg | ORAL_TABLET | Freq: Every day | ORAL | Status: DC
  Filled 2016-04-03: qty 1

## 2016-04-03 NOTE — Care Management Obs Status (Signed)
[place] NOTIFICATION   Patient Details  Name: Elijah Atkerson Sr. MRN: [id number] Date of Birth: 07/10/1934   Medicare Observation Status Notification Given:  No (admitted obs less than 24 hours)    Beverly Sessions, RN 04/03/2016, 12:07 PM

## 2016-04-03 NOTE — Discharge Summary (Signed)
[facility] NAME: Elijah Jackson    MR#:  [id number]  DATE OF BIRTH:  November 12, 1934  DATE OF ADMISSION:  04/02/2016 ADMITTING PHYSICIAN: Demetrios Loll, MD  DATE OF DISCHARGE: 04/03/2016  1:15 PM  PRIMARY CARE PHYSICIAN: No PCP Per Patient    ADMISSION DIAGNOSIS:  Orthostatic hypotension [I95.1] Gastroenteritis [K52.9] Generalized abdominal pain [R10.84] Nausea vomiting and diarrhea [R11.2, R19.7]  DISCHARGE DIAGNOSIS:  Active Problems:   ARF (acute renal failure) ([place])   SECONDARY DIAGNOSIS:   Past Medical History:  Diagnosis Date  . Dyslipidemia   . GERD (gastroesophageal reflux disease)   . Glaucoma   . Hyperlipidemia   . Hypertension   . Insomnia   . Myasthenia gravis ([place]) 09/08/2012  . Obesity   . Ocular myasthenia gravis ([place])   . Prostate cancer [facility])     HOSPITAL COURSE:   1. Acute gastroenteritis with nausea vomiting diarrhea. This has resolved. Patient tolerating diet. 2. Acute renal failure secondary to dehydration. Patient was given IV normal saline during the hospital course and creatinine improved. Creatinine was 1.63 on presentation and down to 1.21 upon discharge home. Creatinine of 1.2 one is close to his baseline of 1.09. 3. Hypotension and orthostatic hypotension is given IV fluids. His lisinopril was held. Not orthostatic upon discharge home. 4. Syncope secondary to dehydration and hypotension. 5. Myasthenia gravis on chronic prednisone. 6. GERD on omeprazole 7. Glaucoma unspecified on latanoprost  DISCHARGE CONDITIONS:   Satisfactory  CONSULTS OBTAINED:   none  DRUG ALLERGIES:  No Known Allergies  DISCHARGE MEDICATIONS:   Discharge Medication List as of 04/03/2016 12:20 PM    CONTINUE these medications which have CHANGED   Details  ondansetron (ZOFRAN ODT) 4 MG disintegrating tablet Take 1 tablet (4 mg total) by mouth every 8 (eight) hours as needed for nausea or vomiting., Starting Fri  04/03/2016, Print    predniSONE (DELTASONE) 1 MG tablet Take 1 tablet (1 mg total) by mouth daily., Starting Fri 04/03/2016, No Print      CONTINUE these medications which have NOT CHANGED   Details  aspirin 81 MG tablet Take 81 mg by mouth daily., Historical Med    Cholecalciferol (VITAMIN D-3) 1000 UNITS CAPS Take 1,000 Units by mouth daily., Historical Med    latanoprost (XALATAN) 0.005 % ophthalmic solution Place 1 drop into both eyes at bedtime., Historical Med    omeprazole (PRILOSEC) 20 MG capsule Take 20 mg by mouth daily., Historical Med      STOP taking these medications     lisinopril (PRINIVIL,ZESTRIL) 20 MG tablet      cholestyramine (QUESTRAN) 4 G packet      tiZANidine (ZANAFLEX) 2 MG tablet      zolpidem (AMBIEN) 10 MG tablet          DISCHARGE INSTRUCTIONS:   Follow-up PMD one week  If you experience worsening of your admission symptoms, develop shortness of breath, life threatening emergency, suicidal or homicidal thoughts you must seek medical attention immediately by calling 911 or calling your MD immediately  if symptoms less severe.  You Must read complete instructions/literature along with all the possible adverse reactions/side effects for all the Medicines you take and that have been prescribed to you. Take any new Medicines after you have completely understood and accept all the possible adverse reactions/side effects.   Please note  You were cared for by a hospitalist during your hospital stay. If you have any questions about your  discharge medications or the care you received while you were in the hospital after you are discharged, you can call the unit and asked to speak with the hospitalist on call if the hospitalist that took care of you is not available. Once you are discharged, your primary care physician will handle any further medical issues. Please note that NO REFILLS for any discharge medications will be authorized once you are discharged,  as it is imperative that you return to your primary care physician (or establish a relationship with a primary care physician if you do not have one) for your aftercare needs so that they can reassess your need for medications and monitor your lab values.    Today   CHIEF COMPLAINT:   Chief Complaint  Patient presents with  . Nausea  . Emesis  . Diarrhea    HISTORY OF PRESENT ILLNESS:  Elijah Jackson  is a 81 y.o. male presented with nausea vomiting diarrhea.   VITAL SIGNS:  Blood pressure (!) 103/56, pulse 89, temperature 98 F (36.7 C), temperature source Oral, resp. rate 20, height 5\' 9"  (1.753 m), weight 86.2 kg (190 lb), SpO2 95 %.   PHYSICAL EXAMINATION:  GENERAL:  81 y.o.-year-old patient lying in the bed with no acute distress.  EYES: Pupils equal, round, reactive to light and accommodation. No scleral icterus. Extraocular muscles intact.  HEENT: Head atraumatic, normocephalic. Oropharynx and nasopharynx clear.  NECK:  Supple, no jugular venous distention. No thyroid enlargement, no tenderness.  LUNGS: Normal breath sounds bilaterally, no wheezing, rales,rhonchi or crepitation. No use of accessory muscles of respiration.  CARDIOVASCULAR: S1, S2 normal. No murmurs, rubs, or gallops.  ABDOMEN: Soft, non-tender, non-distended. Bowel sounds present. No organomegaly or mass.  EXTREMITIES: No pedal edema, cyanosis, or clubbing.  NEUROLOGIC: Cranial nerves II through XII are intact. Muscle strength 5/5 in all extremities. Sensation intact. Gait not checked.  PSYCHIATRIC: The patient is alert and oriented x 3.  SKIN: No obvious rash, lesion, or ulcer.   DATA REVIEW:   CBC  Recent Labs Lab 04/03/16 0411  WBC 9.5  HGB 13.7  HCT 38.6*  PLT 171    Chemistries   Recent Labs Lab 04/02/16 0944 04/03/16 0411  NA 139 138  K 4.4 4.2  CL 107 112*  CO2 25 21*  GLUCOSE 141* 116*  BUN 31* 33*  CREATININE 1.63* 1.21  CALCIUM 9.4 7.8*  MG 2.2  --   AST 26  --   ALT 21   --   ALKPHOS 65  --   BILITOT 1.1  --      Microbiology Results  Results for orders placed or performed during the hospital encounter of 04/02/16  C difficile quick scan w PCR reflex     Status: None   Collection Time: 04/02/16  7:23 PM  Result Value Ref Range Status   C Diff antigen NEGATIVE NEGATIVE Final   C Diff toxin NEGATIVE NEGATIVE Final   C Diff interpretation No C. difficile detected.  Final     Management plans discussed with the patient, And he is in agreement.  CODE STATUS:  Code Status History    Date Active Date Inactive Code Status Order ID Comments User Context   04/02/2016  3:29 PM 04/03/2016  4:21 PM Full Code [id number]  Demetrios Loll, MD Inpatient    Advance Directive Documentation   Flowsheet Row Most Recent Value  Type of Advance Directive  Healthcare Power of Attorney, Living will  Pre-existing out of facility  DNR order (yellow form or pink MOST form)  No data  "MOST" Form in Place?  No data      TOTAL TIME TAKING CARE OF THIS PATIENT: 35 minutes.    Loletha Grayer M.D on 04/03/2016 at 5:59 PM  Between 7am to 6pm - Pager - [phone]  After 6pm go to [email or web address] - password [email or web address]  [facility]  [phone]  CC: Primary care physician; No PCP Per Patient

## 2016-04-03 NOTE — Progress Notes (Addendum)
Patient discharged to home as ordered. Patient is alert and oriented, wife at the bedside and believes that the prednisone dose was 5 mg instead of 1 mg and she will clarify the dose with his primary doctor. Patients IV discontinued to left hand site clean dry and intact. Patient ambulated without assistance denies feeling light headed orthostatic VS S. Prescription given to patient with for Zofran. Discharge instructions given as ordered

## 2016-04-07 LAB — GASTROINTESTINAL PANEL BY PCR, STOOL (REPLACES STOOL CULTURE)
ASTROVIRUS: NOT DETECTED
Adenovirus F40/41: NOT DETECTED
Campylobacter species: NOT DETECTED
Cryptosporidium: NOT DETECTED
Cyclospora cayetanensis: NOT DETECTED
ENTAMOEBA HISTOLYTICA: NOT DETECTED
ENTEROAGGREGATIVE E COLI (EAEC): NOT DETECTED
ENTEROPATHOGENIC E COLI (EPEC): NOT DETECTED
Enterotoxigenic E coli (ETEC): NOT DETECTED
Giardia lamblia: NOT DETECTED
NOROVIRUS GI/GII: DETECTED — AB
PLESIMONAS SHIGELLOIDES: NOT DETECTED
ROTAVIRUS A: NOT DETECTED
SAPOVIRUS (I, II, IV, AND V): NOT DETECTED
SHIGA LIKE TOXIN PRODUCING E COLI (STEC): NOT DETECTED
SHIGELLA/ENTEROINVASIVE E COLI (EIEC): NOT DETECTED
Salmonella species: NOT DETECTED
VIBRIO SPECIES: NOT DETECTED
Vibrio cholerae: NOT DETECTED
YERSINIA ENTEROCOLITICA: NOT DETECTED

## 2016-04-14 ENCOUNTER — Emergency Department (HOSPITAL_COMMUNITY): Payer: Medicare Other

## 2016-04-14 ENCOUNTER — Inpatient Hospital Stay (HOSPITAL_COMMUNITY)
Admission: EM | Admit: 2016-04-14 | Discharge: 2016-04-17 | DRG: 189 | Disposition: A | Discharge status: Home / Self Care | Payer: Medicare Other | Attending: Internal Medicine | Admitting: Internal Medicine

## 2016-04-14 ENCOUNTER — Encounter (HOSPITAL_COMMUNITY): Payer: Self-pay

## 2016-04-14 DIAGNOSIS — K2970 Gastritis, unspecified, without bleeding: Secondary | ICD-10-CM | POA: Diagnosis present

## 2016-04-14 DIAGNOSIS — R531 Weakness: Secondary | ICD-10-CM

## 2016-04-14 DIAGNOSIS — N179 Acute kidney failure, unspecified: Secondary | ICD-10-CM | POA: Diagnosis present

## 2016-04-14 DIAGNOSIS — I82412 Acute embolism and thrombosis of left femoral vein: Secondary | ICD-10-CM | POA: Diagnosis present

## 2016-04-14 DIAGNOSIS — Z7952 Long term (current) use of systemic steroids: Secondary | ICD-10-CM

## 2016-04-14 DIAGNOSIS — E860 Dehydration: Secondary | ICD-10-CM | POA: Diagnosis present

## 2016-04-14 DIAGNOSIS — E669 Obesity, unspecified: Secondary | ICD-10-CM | POA: Diagnosis present

## 2016-04-14 DIAGNOSIS — E785 Hyperlipidemia, unspecified: Secondary | ICD-10-CM | POA: Diagnosis present

## 2016-04-14 DIAGNOSIS — K209 Esophagitis, unspecified without bleeding: Secondary | ICD-10-CM

## 2016-04-14 DIAGNOSIS — E876 Hypokalemia: Secondary | ICD-10-CM | POA: Diagnosis present

## 2016-04-14 DIAGNOSIS — J9601 Acute respiratory failure with hypoxia: Principal | ICD-10-CM | POA: Diagnosis present

## 2016-04-14 DIAGNOSIS — G7000 Myasthenia gravis without (acute) exacerbation: Secondary | ICD-10-CM | POA: Diagnosis present

## 2016-04-14 DIAGNOSIS — K219 Gastro-esophageal reflux disease without esophagitis: Secondary | ICD-10-CM | POA: Diagnosis present

## 2016-04-14 DIAGNOSIS — Z87891 Personal history of nicotine dependence: Secondary | ICD-10-CM

## 2016-04-14 DIAGNOSIS — I10 Essential (primary) hypertension: Secondary | ICD-10-CM | POA: Diagnosis present

## 2016-04-14 DIAGNOSIS — I824Z1 Acute embolism and thrombosis of unspecified deep veins of right distal lower extremity: Secondary | ICD-10-CM | POA: Diagnosis present

## 2016-04-14 DIAGNOSIS — I2692 Saddle embolus of pulmonary artery without acute cor pulmonale: Secondary | ICD-10-CM | POA: Diagnosis present

## 2016-04-14 DIAGNOSIS — I2699 Other pulmonary embolism without acute cor pulmonale: Secondary | ICD-10-CM

## 2016-04-14 DIAGNOSIS — Z79899 Other long term (current) drug therapy: Secondary | ICD-10-CM

## 2016-04-14 DIAGNOSIS — Z8546 Personal history of malignant neoplasm of prostate: Secondary | ICD-10-CM

## 2016-04-14 DIAGNOSIS — J189 Pneumonia, unspecified organism: Secondary | ICD-10-CM | POA: Diagnosis present

## 2016-04-14 DIAGNOSIS — Y95 Nosocomial condition: Secondary | ICD-10-CM | POA: Diagnosis present

## 2016-04-14 DIAGNOSIS — K210 Gastro-esophageal reflux disease with esophagitis: Secondary | ICD-10-CM | POA: Diagnosis present

## 2016-04-14 DIAGNOSIS — I82432 Acute embolism and thrombosis of left popliteal vein: Secondary | ICD-10-CM | POA: Diagnosis present

## 2016-04-14 DIAGNOSIS — Z6829 Body mass index (BMI) 29.0-29.9, adult: Secondary | ICD-10-CM

## 2016-04-14 DIAGNOSIS — Z9079 Acquired absence of other genital organ(s): Secondary | ICD-10-CM

## 2016-04-14 DIAGNOSIS — H409 Unspecified glaucoma: Secondary | ICD-10-CM | POA: Diagnosis present

## 2016-04-14 DIAGNOSIS — I951 Orthostatic hypotension: Secondary | ICD-10-CM | POA: Diagnosis present

## 2016-04-14 DIAGNOSIS — R634 Abnormal weight loss: Secondary | ICD-10-CM | POA: Diagnosis present

## 2016-04-14 DIAGNOSIS — J984 Other disorders of lung: Secondary | ICD-10-CM

## 2016-04-14 DIAGNOSIS — Z9849 Cataract extraction status, unspecified eye: Secondary | ICD-10-CM

## 2016-04-14 LAB — CBC
HCT: 43.5 % (ref 39.0–52.0)
HEMOGLOBIN: 15.3 g/dL (ref 13.0–17.0)
MCH: 31.8 pg (ref 26.0–34.0)
MCHC: 35.2 g/dL (ref 30.0–36.0)
MCV: 90.4 fL (ref 78.0–100.0)
Platelets: 277 10*3/uL (ref 150–400)
RBC: 4.81 MIL/uL (ref 4.22–5.81)
RDW: 13 % (ref 11.5–15.5)
WBC: 10.3 10*3/uL (ref 4.0–10.5)

## 2016-04-14 LAB — URINALYSIS, ROUTINE W REFLEX MICROSCOPIC
Bilirubin Urine: NEGATIVE
Glucose, UA: NEGATIVE mg/dL
Hgb urine dipstick: NEGATIVE
KETONES UR: NEGATIVE mg/dL
Leukocytes, UA: NEGATIVE
NITRITE: NEGATIVE
Protein, ur: NEGATIVE mg/dL
SPECIFIC GRAVITY, URINE: 1.028 (ref 1.005–1.030)
pH: 5 (ref 5.0–8.0)

## 2016-04-14 LAB — BASIC METABOLIC PANEL
Anion gap: 11 (ref 5–15)
BUN: 20 mg/dL (ref 6–20)
CALCIUM: 8.8 mg/dL — AB (ref 8.9–10.3)
CO2: 25 mmol/L (ref 22–32)
Chloride: 100 mmol/L — ABNORMAL LOW (ref 101–111)
Creatinine, Ser: 1.32 mg/dL — ABNORMAL HIGH (ref 0.61–1.24)
GFR calc Af Amer: 58 mL/min — ABNORMAL LOW (ref >60)
GFR, EST NON AFRICAN AMERICAN: 50 mL/min — AB (ref >60)
GLUCOSE: 114 mg/dL — AB (ref 65–99)
Potassium: 3.4 mmol/L — ABNORMAL LOW (ref 3.5–5.1)
SODIUM: 136 mmol/L (ref 135–145)

## 2016-04-14 LAB — CK: CK TOTAL: 25 U/L — AB (ref 49–397)

## 2016-04-14 LAB — I-STAT TROPONIN, ED: Troponin i, poc: 0.04 ng/mL (ref 0.00–0.08)

## 2016-04-14 LAB — HEPATIC FUNCTION PANEL
ALT: 15 U/L — ABNORMAL LOW (ref 17–63)
AST: 22 U/L (ref 15–41)
Albumin: 3.1 g/dL — ABNORMAL LOW (ref 3.5–5.0)
Alkaline Phosphatase: 73 U/L (ref 38–126)
Bilirubin, Direct: 0.2 mg/dL (ref 0.1–0.5)
Indirect Bilirubin: 0 mg/dL — ABNORMAL LOW (ref 0.3–0.9)
TOTAL PROTEIN: 6.5 g/dL (ref 6.5–8.1)
Total Bilirubin: 0.2 mg/dL — ABNORMAL LOW (ref 0.3–1.2)

## 2016-04-14 LAB — LACTIC ACID, PLASMA: LACTIC ACID, VENOUS: 1.2 mmol/L (ref 0.5–1.9)

## 2016-04-14 LAB — CBG MONITORING, ED: GLUCOSE-CAPILLARY: 117 mg/dL — AB (ref 65–99)

## 2016-04-14 LAB — LIPASE, BLOOD: LIPASE: 29 U/L (ref 11–51)

## 2016-04-14 MED ORDER — PROMETHAZINE HCL 25 MG/ML IJ SOLN
12.5000 mg | Freq: Once | INTRAMUSCULAR | Status: AC
  Administered 2016-04-14: 12.5 mg via INTRAVENOUS
  Filled 2016-04-14: qty 1

## 2016-04-14 MED ORDER — ATORVASTATIN CALCIUM 20 MG PO TABS
20.0000 mg | ORAL_TABLET | Freq: Every day | ORAL | Status: DC
  Administered 2016-04-15 – 2016-04-16 (×2): 20 mg via ORAL
  Filled 2016-04-14 (×2): qty 1

## 2016-04-14 MED ORDER — AZITHROMYCIN 250 MG PO TABS
250.0000 mg | ORAL_TABLET | Freq: Every day | ORAL | Status: DC
  Filled 2016-04-14: qty 1

## 2016-04-14 MED ORDER — LATANOPROST 0.005 % OP SOLN
1.0000 [drp] | Freq: Every day | OPHTHALMIC | Status: DC
Start: 2016-04-14 — End: 2016-04-17
  Administered 2016-04-15: 1 [drp] via OPHTHALMIC
  Filled 2016-04-14: qty 2.5

## 2016-04-14 MED ORDER — ACETAMINOPHEN 325 MG PO TABS
650.0000 mg | ORAL_TABLET | Freq: Four times a day (QID) | ORAL | Status: DC | PRN
  Administered 2016-04-16 – 2016-04-17 (×2): 650 mg via ORAL
  Filled 2016-04-14 (×2): qty 2

## 2016-04-14 MED ORDER — SODIUM CHLORIDE 0.9 % IV BOLUS (SEPSIS)
1000.0000 mL | Freq: Once | INTRAVENOUS | Status: AC
  Administered 2016-04-14: 1000 mL via INTRAVENOUS

## 2016-04-14 MED ORDER — IOPAMIDOL (ISOVUE-300) INJECTION 61%
INTRAVENOUS | Status: AC
  Administered 2016-04-14: 100 mL via INTRAVENOUS
  Filled 2016-04-14: qty 100

## 2016-04-14 MED ORDER — ADULT MULTIVITAMIN W/MINERALS CH
1.0000 | ORAL_TABLET | Freq: Every morning | ORAL | Status: DC
  Administered 2016-04-15 – 2016-04-17 (×3): 1 via ORAL
  Filled 2016-04-14 (×3): qty 1

## 2016-04-14 MED ORDER — DM-GUAIFENESIN ER 30-600 MG PO TB12
1.0000 | ORAL_TABLET | Freq: Two times a day (BID) | ORAL | Status: DC
  Administered 2016-04-15 – 2016-04-17 (×6): 1 via ORAL
  Filled 2016-04-14 (×7): qty 1

## 2016-04-14 MED ORDER — DEXTROSE 5 % IV SOLN: 2.0000 g | INTRAVENOUS | Status: DC

## 2016-04-14 MED ORDER — ENOXAPARIN SODIUM 40 MG/0.4ML ~~LOC~~ SOLN
40.0000 mg | SUBCUTANEOUS | Status: DC
  Administered 2016-04-15: 40 mg via SUBCUTANEOUS
  Filled 2016-04-14: qty 0.4

## 2016-04-14 MED ORDER — DEXTROSE 5 % IV SOLN
2.0000 g | Freq: Once | INTRAVENOUS | Status: AC
  Administered 2016-04-14: 2 g via INTRAVENOUS
  Filled 2016-04-14: qty 2

## 2016-04-14 MED ORDER — SODIUM CHLORIDE 0.9 % IV SOLN
INTRAVENOUS | Status: DC
  Administered 2016-04-15 (×2): via INTRAVENOUS

## 2016-04-14 MED ORDER — HYDROCORTISONE NA SUCCINATE PF 100 MG IJ SOLR
50.0000 mg | Freq: Once | INTRAMUSCULAR | Status: AC
  Administered 2016-04-15: 50 mg via INTRAVENOUS
  Filled 2016-04-14: qty 1

## 2016-04-14 MED ORDER — VITAMIN D 1000 UNITS PO TABS
1000.0000 [IU] | ORAL_TABLET | Freq: Every day | ORAL | Status: DC
Start: 2016-04-14 — End: 2016-04-17
  Administered 2016-04-15 – 2016-04-17 (×3): 1000 [IU] via ORAL
  Filled 2016-04-14 (×3): qty 1

## 2016-04-14 MED ORDER — ALBUTEROL SULFATE (2.5 MG/3ML) 0.083% IN NEBU: 2.5000 mg | INHALATION_SOLUTION | RESPIRATORY_TRACT | Status: DC | PRN

## 2016-04-14 MED ORDER — PANTOPRAZOLE SODIUM 40 MG PO TBEC
40.0000 mg | DELAYED_RELEASE_TABLET | Freq: Every day | ORAL | Status: DC
  Administered 2016-04-15 (×2): 40 mg via ORAL
  Filled 2016-04-14 (×2): qty 1

## 2016-04-14 MED ORDER — VANCOMYCIN HCL IN DEXTROSE 750-5 MG/150ML-% IV SOLN
750.0000 mg | Freq: Two times a day (BID) | INTRAVENOUS | Status: DC
  Filled 2016-04-14: qty 150

## 2016-04-14 MED ORDER — ONDANSETRON 4 MG PO TBDP
4.0000 mg | ORAL_TABLET | Freq: Three times a day (TID) | ORAL | Status: DC | PRN
  Filled 2016-04-14: qty 1

## 2016-04-14 MED ORDER — POTASSIUM CHLORIDE 20 MEQ/15ML (10%) PO SOLN
20.0000 meq | Freq: Once | ORAL | Status: AC
  Administered 2016-04-15: 20 meq via ORAL
  Filled 2016-04-14: qty 15

## 2016-04-14 MED ORDER — PREDNISONE 10 MG PO TABS
5.0000 mg | ORAL_TABLET | Freq: Every day | ORAL | Status: DC
  Administered 2016-04-15 – 2016-04-17 (×3): 5 mg via ORAL
  Filled 2016-04-14 (×3): qty 1

## 2016-04-14 MED ORDER — VANCOMYCIN HCL IN DEXTROSE 1-5 GM/200ML-% IV SOLN: 1000.0000 mg | Freq: Once | INTRAVENOUS | Status: DC

## 2016-04-14 MED ORDER — VANCOMYCIN HCL 10 G IV SOLR
1500.0000 mg | Freq: Once | INTRAVENOUS | Status: AC
  Administered 2016-04-15: 1500 mg via INTRAVENOUS
  Filled 2016-04-14: qty 1500

## 2016-04-14 MED ORDER — AZITHROMYCIN 250 MG PO TABS
500.0000 mg | ORAL_TABLET | Freq: Every day | ORAL | Status: AC
  Administered 2016-04-15: 500 mg via ORAL
  Filled 2016-04-14: qty 2

## 2016-04-14 MED ORDER — ASPIRIN EC 81 MG PO TBEC
81.0000 mg | DELAYED_RELEASE_TABLET | Freq: Every day | ORAL | Status: DC
  Administered 2016-04-15 – 2016-04-17 (×3): 81 mg via ORAL
  Filled 2016-04-14 (×3): qty 1

## 2016-04-14 MED ORDER — SUCRALFATE 1 GM/10ML PO SUSP
1.0000 g | Freq: Every day | ORAL | Status: DC
  Administered 2016-04-15 – 2016-04-16 (×2): 1 g via ORAL
  Filled 2016-04-14 (×2): qty 10

## 2016-04-14 NOTE — ED Provider Notes (Signed)
Emergency Department Provider Note   I have reviewed the triage vital signs and the nursing notes.   HISTORY  Chief Complaint Weakness   HPI Elijah Gallerani Sr. is a 82 y.o. male with PMH of GERD, HLD, HTN, MG, and obesity as to the emergency department for evaluation of persistent nausea and decreased oral intake. The patient presented to [facility] and was admitted overnight with nausea, vomiting, diarrhea. He was discharged the next morning after IV fluids. Family states that he is smaller breakfast prior to discharge. He began taking the Zofran as prescribed but has felt persistently nauseated over the past week. No additional vomiting or diarrhea. The patient denies any chest pain, difficulty breathing, headache, or URI symptoms. Family states that he drinks very little water and eats almost nothing. They describe an approximate 10 pound weight loss. No other changes to medications. No exacerbating factors. Patient has been taking ginger ale which helps his nausea somewhat.   He presented to urgent care today and was referred to the emergency department for IV fluids and further evaluation.   Past Medical History:  Diagnosis Date  . Dyslipidemia   . GERD (gastroesophageal reflux disease)   . Glaucoma   . Hyperlipidemia   . Hypertension   . Insomnia   . Myasthenia gravis ([place]) 09/08/2012  . Obesity   . Ocular myasthenia gravis ([place])   . Prostate cancer [facility])     Patient Active Problem List   Diagnosis Date Noted  . Acute respiratory failure with hypoxia ([place]) 04/14/2016  . PNA (pneumonia) 04/14/2016  . Essential hypertension 04/14/2016  . HLD (hyperlipidemia) 04/14/2016  . GERD (gastroesophageal reflux disease) 04/14/2016  . ARF (acute renal failure) ([place]) 04/02/2016  . Postcholecystectomy diarrhea 09/14/2013  . Dizziness and giddiness 08/02/2013  . Chronic cholecystitis 01/25/2013  . Myasthenia gravis ([place]) 09/08/2012  . Abdominal pain, RUQ 09/08/2012  . Pancreatitis, acute  09/08/2012  . Myasthenic syndromes in diseases classified elsewhere 05/06/2012    Past Surgical History:  Procedure Laterality Date  . CATARACT EXTRACTION Bilateral   . CHOLECYSTECTOMY    . TRANSURETHRAL RESECTION OF PROSTATE      Current Outpatient Rx  . Order #: [id number] Class: Historical Med  . Order #: [id number] Class: Historical Med  . Order #: [id number] Class: Historical Med  . Order #: [id number] Class: Historical Med  . Order #: [id number] Class: Historical Med  . Order #: [id number] Class: Historical Med  . Order #: [id number] Class: Historical Med  . Order #: [id number] Class: Historical Med  . Order #: [id number] Class: Print  . Order #: [id number] Class: Historical Med  . Order #: [id number] Class: Historical Med  . Order #: [id number] Class: No Print    Allergies Patient has no known allergies.  Family History  Problem Relation Age of Onset  . Heart attack Father     Social History Social History  Substance Use Topics  . Smoking status: Former Smoker    Quit date: 04/06/1970  . Smokeless tobacco: Never Used  . Alcohol use 0.0 oz/week     Comment: Consumes alcohol on occasion    Review of Systems  Constitutional: No fever/chills. Positive lightheadedness on standing.  Eyes: No visual changes. ENT: No sore throat. Cardiovascular: Denies chest pain. Respiratory: Denies shortness of breath. Gastrointestinal: No abdominal pain. Positive nausea, no vomiting.  No diarrhea.  No constipation. Genitourinary: Negative for dysuria. Musculoskeletal: Negative for back pain. Skin: Negative for rash. Neurological: Negative for headaches, focal weakness or numbness.  10-point ROS otherwise negative.  ____________________________________________   PHYSICAL EXAM:  VITAL SIGNS: ED Triage Vitals  Enc Vitals Group     BP 04/14/16 1205 128/63     Pulse Rate 04/14/16 1205 69     Resp 04/14/16 1205 18     Temp 04/14/16 1205 97.6 F (36.4 C)     Temp Source 04/14/16 1205 Oral      SpO2 04/14/16 1205 96 %     Pain Score 04/14/16 1607 0   Constitutional: Alert and oriented. Well appearing and in no acute distress. Eyes: Conjunctivae are normal.  Head: Atraumatic. Nose: No congestion/rhinnorhea. Mouth/Throat: Mucous membranes are moist.  Oropharynx non-erythematous. Neck: No stridor.   Cardiovascular: Normal rate, regular rhythm. Good peripheral circulation. Grossly normal heart sounds.   Respiratory: Normal respiratory effort.  No retractions. Lungs CTAB. Gastrointestinal: Soft with mild epigastric tenderness to palpation. No distention.  Musculoskeletal: No lower extremity tenderness nor edema. No gross deformities of extremities. Neurologic:  Normal speech and language. No gross focal neurologic deficits are appreciated.  Skin:  Skin is warm, dry and intact. No rash noted.  ____________________________________________   LABS (all labs ordered are listed, but only abnormal results are displayed)  Labs Reviewed  BASIC METABOLIC PANEL - Abnormal; Notable for the following:       Result Value   Potassium 3.4 (*)    Chloride 100 (*)    Glucose, Bld 114 (*)    Creatinine, Ser 1.32 (*)    Calcium 8.8 (*)    GFR calc non Af Amer 50 (*)    GFR calc Af Amer 58 (*)    All other components within normal limits  HEPATIC FUNCTION PANEL - Abnormal; Notable for the following:    Albumin 3.1 (*)    ALT 15 (*)    Total Bilirubin 0.2 (*)    Indirect Bilirubin 0.0 (*)    All other components within normal limits  CBG MONITORING, ED - Abnormal; Notable for the following:    Glucose-Capillary 117 (*)    All other components within normal limits  CBC  URINALYSIS, ROUTINE W REFLEX MICROSCOPIC  LIPASE, BLOOD  INFLUENZA PANEL BY PCR (TYPE A & B, H1N1)  I-STAT TROPOININ, ED   ____________________________________________  EKG   EKG Interpretation  Date/Time:  Tuesday April 14 2016 12:12:54 EST Ventricular Rate:  68 PR Interval:  220 QRS Duration: 108 QT  Interval:  404 QTC Calculation: 429 R Axis:   41 Text Interpretation:  Sinus rhythm with 1st degree A-V block Otherwise normal ECG No STEMI.  Confirmed by Brigido Mera MD, Zaria Taha [phone]) on 04/14/2016 4:28:11 PM       ____________________________________________  RADIOLOGY  Dg Chest 2 View  Result Date: 04/14/2016 CLINICAL DATA:  82 y/o  M; concern for pneumonia. EXAM: CHEST  2 VIEW COMPARISON:  05/08/2004 chest CT FINDINGS: Stable cardiac silhouette. Consolidation in the left upper lobe lingula. No pleural effusion. Mild degenerative changes of thoracic spine. Cholecystectomy clips. IMPRESSION: Consolidation in the left upper lobe lingula probably represents pneumonia. Follow-up to resolution is recommended with radiographs to exclude an underlying lesion. These results were called by telephone at the time of interpretation on 04/14/2016 at 8:17 pm to Dr. Nanda Quinton , who verbally acknowledged these results. Electronically Signed   By: Kristine Garbe M.D.   On: 04/14/2016 20:18   Ct Abdomen Pelvis W Contrast  Addendum Date: 04/14/2016   ADDENDUM REPORT: 04/14/2016 18:35 ADDENDUM: Study discussed by telephone with Dr. Vonna Kotyk Allayah Raineri on 04/14/2016 at 1805 hours. Electronically Signed   By: Lemmie Evens  [place] Crane M.D.   On: 04/14/2016 18:35   Result Date: 04/14/2016 CLINICAL DATA:  82 year old male with unintentional weight loss since December. Epigastric abdominal pain. Increased weakness since hospitalization at [facility] in late December. Initial encounter. Personal history of prostate cancer. EXAM: CT ABDOMEN AND PELVIS WITH CONTRAST TECHNIQUE: Multidetector CT imaging of the abdomen and pelvis was performed using the standard protocol following bolus administration of intravenous contrast. CONTRAST:  11mL ISOVUE-300 IOPAMIDOL (ISOVUE-300) INJECTION 61% I was called by CT to examine this patient for contrast extravasation to the left upper extremity at 1715 hours. There is intravenous contrast  present on the study. 40 mL to at most 60 mL of IV contrast extravasated into the proximal left ventral arm. The patient was in no distress. There was ecchymosis throughout the extravasation area which was swollen, indicating a degree of superimposed hematoma. I reviewed the natural history of contrast extravasation and advised the patient to keep the extremity elevated, use ice, and over the counter are anti-inflammatory medicine. I recommended he return to the ED for any increasing left upper extremity pain or skin changes. COMPARISON:  CT Abdomen and Pelvis 01/07/2014. FINDINGS: Lower chest: Partially visible 3-4 cm mass like area in the left lingula (series 4, image 1). Surrounding reticulonodular density in both the lingula and the left lower lobe (series 4, image 4). No associated left pleural effusion. Right lung base is stable since 2015 and negative. No right pleural effusion. No pericardial effusion. No upper abdominal free air. Intermittent mild motion artifact in the abdomen. Hepatobiliary: Surgically absent gallbladder as before. Stable intra and extrahepatic biliary tree. Negative liver. Pancreas: Negative. Spleen: Negative. No abdominal free fluid. Adrenals/Urinary Tract: Normal adrenal glands. Bilateral renal enhancement and contrast excretion is normal. No hydronephrosis. No urologic calculus identified. Unremarkable urinary bladder. Stomach/Bowel: Negative rectum. Retained stool mixed with contrast in the distal colon. Redundant sigmoid colon with diverticulosis, but no active inflammation identified. Negative left colon. Dense retained oral contrast at the splenic flexure and throughout the transverse colon which otherwise is negative. Similar dense retained contrast in the right colon. Negative right colon and appendix. Negative terminal ileum. No dilated small bowel. Decompressed stomach. Negative duodenum. Vascular/Lymphatic: Aortoiliac calcified atherosclerosis noted. Major arterial structures  in the abdomen and pelvis are patent. Portal venous system is patent. No lymphadenopathy. Reproductive: Prostate brachytherapy changes. Small fat containing left inguinal hernia is stable. Other: No pelvic free fluid. Musculoskeletal: Degenerative changes in the spine. No acute or suspicious osseous lesion identified. IMPRESSION: 1. IV contrast extravasation occurred along with some hematoma into the patient's left upper extremity as detailed in the Contrast section above. Nonetheless adequate intravenous contrast on the study. 2. Partially visible 3-4 cm mass like opacity in the peripheral lingula with surrounding lingula and left lower lobe tree-in-bud type nodular pulmonary opacity. No associated pleural effusion. Recommend follow-up PA and lateral chest radiographs, but at this point favor a multilobar pneumonia over left lung bronchogenic carcinoma. Query fever. If infection is suspected clinically then Followup PA and lateral chest X-ray is recommended in 3-4 weeks following trial of antibiotic therapy to ensure resolution and exclude underlying malignancy. 3. No acute or inflammatory process identified in the abdomen or pelvis. Normal appendix. Diverticulosis of the sigmoid colon without active inflammation. 4.  Calcified aortic atherosclerosis. Electronically Signed: By: Genevie Ann M.D. On: 04/14/2016 17:55    ____________________________________________   PROCEDURES  Procedure(s) performed:   Procedures  None ____________________________________________   INITIAL IMPRESSION / ASSESSMENT AND PLAN /  ED COURSE  Pertinent labs & imaging results that were available during my care of the patient were reviewed by me and considered in my medical decision making (see chart for details).  Patient resents to the emergency department for evaluation of persistent nausea, lightheadedness, and weight loss in the setting of poor oral intake. Denies any chest pain or difficult breathing. Low suspicion for  atypical ACS presentation. He shouldn't does have some mild epigastric tenderness to palpation. No rebound or guarding. No CVA tenderness. No continued fever. Mild elevated creatinine since discharge 11 days prior. No leukocytosis. EKG unremarkable. Plan for troponin, IVF, phenergan, and CT scan.   06:18 PM Updated patient regarding his CT scan results. He is feeling sleepy after the Phenergan but awakens easily. Spoke with radiology Dr. [place] Crane regarding the CT findings and the extravasation. Dr. [place] Crane also spoke with the patient and family regarding the dye extravasation which after reviewing the CT scan he feels was likely low volume. Plan for chest x-ray follow-up with questionable pneumonia on CT. No intra-abdominal process.  CXR with evidence of PNA. On re-evaluation the patient is sleeping and O2 sat is 87% on RA. Plan for HCAP coverage and admission. Nausea is controlled well with phenergan but patient is having some drowsiness.   Discussed patient's case with hospitalist, Dr. Blaine Hamper.  Recommend admission to obs, med-surg bed.  I will place holding orders per their request. Patient and family (if present) updated with plan. Care transferred to hospitalsit service.  I reviewed all nursing notes, vitals, pertinent old records, EKGs, labs, imaging (as available).  ____________________________________________  FINAL CLINICAL IMPRESSION(S) / ED DIAGNOSES  Final diagnoses:  Healthcare-associated pneumonia  Weakness     MEDICATIONS GIVEN DURING THIS VISIT:  Medications  ceFEPIme (MAXIPIME) 2 g in dextrose 5 % 50 mL IVPB (not administered)  sodium chloride 0.9 % bolus 1,000 mL (not administered)  vancomycin (VANCOCIN) 1,500 mg in sodium chloride 0.9 % 500 mL IVPB (not administered)  ceFEPIme (MAXIPIME) 2 g in dextrose 5 % 50 mL IVPB (not administered)  vancomycin (VANCOCIN) IVPB 750 mg/150 ml premix (not administered)  sodium chloride 0.9 % bolus 1,000 mL (0 mLs Intravenous Stopped 04/14/16 1733)   promethazine (PHENERGAN) injection 12.5 mg (12.5 mg Intravenous Given 04/14/16 1633)  iopamidol (ISOVUE-300) 61 % injection (100 mLs Intravenous Contrast Given 04/14/16 1706)     NEW OUTPATIENT MEDICATIONS STARTED DURING THIS VISIT:  None   Note:  This document was prepared using Dragon voice recognition software and may include unintentional dictation errors.  Nanda Quinton, MD Emergency Medicine   Margette Fast, MD 04/14/16 2114

## 2016-04-14 NOTE — ED Notes (Signed)
Iv team at bedside  

## 2016-04-14 NOTE — H&P (Addendum)
History and Physical    Elijah Berman Sr. [id number] DOB: 07/13/1934 DOA: 04/14/2016  Referring MD/NP/PA:   PCP: No PCP Per Patient   Patient coming from:  The patient is coming from home.  At baseline, pt is independent for most of ADL.   Chief Complaint: Generalized weakness, decreased oral intake, nausea  HPI: Elijah Guzman Sr. is a 82 y.o. male with medical history significant of hypertension, hyperlipidemia, GERD, prostate cancer (s/p radiation seed), right eye myasthenia gravis, who presents with generalized weakness, decreased oral intake, nausea.  Patient states that he was recently hospitalized from 12/28-12/29 [facility] due to acute gastroenteritis. Patient states that he has been having generalized weakness, decreased oral intake, nausea after discharge. He had 10 pounds of body weight loss since 12/29. He does not have vomiting, diarrhea or abdominal pain. Patient denies chest pain, cough or SOB, but was found to have oxygen desaturation to 88% in ED. No fever, chills, runny nose or sore throat. No symptoms of UTI or unilateral weakness.  ED Course: pt was found to have WBC 10.3, potassium 3.4, acute renal injury with creatinine 1.32, temperature normal, chest x-ray showed possible left upper lobe infiltration. CT abdomen/pelvis is negative for intraabdominal abnormalities, but showed partially visible 3-4 cm mass like opacity in the peripheral lingula with surrounding lingula and left lower lobe tree-in-bud type nodular pulmonary opacity. No associated pleural effusion. Per radiologist, at this point favor a multilobar pneumonia over left lung bronchogenic carcinoma. Pt is placed on med-surg bed for obs.  Review of Systems:   General: no fevers, chills, has body weight loss, has poor appetite, has fatigue HEENT: no blurry vision, hearing changes or sore throat Respiratory: no dyspnea, coughing, wheezing CV: no chest pain, no palpitations GI: has nausea, no vomiting, abdominal pain,  diarrhea, constipation GU: no dysuria, burning on urination, increased urinary frequency, hematuria  Ext: no leg edema Neuro: no unilateral weakness, numbness, or tingling, no vision change or hearing loss Skin: no rash, no skin tear. MSK: No muscle spasm, no deformity, no limitation of range of movement in spin Heme: No easy bruising.  Travel history: No recent long distant travel.  Allergy: No Known Allergies  Past Medical History:  Diagnosis Date  . Dyslipidemia   . GERD (gastroesophageal reflux disease)   . Glaucoma   . Hyperlipidemia   . Hypertension   . Insomnia   . Myasthenia gravis ([place]) 09/08/2012  . Obesity   . Ocular myasthenia gravis ([place])   . Prostate cancer [facility])     Past Surgical History:  Procedure Laterality Date  . CATARACT EXTRACTION Bilateral   . CHOLECYSTECTOMY    . TRANSURETHRAL RESECTION OF PROSTATE      Social History:  reports that he quit smoking about 46 years ago. He has never used smokeless tobacco. He reports that he drinks alcohol. He reports that he does not use drugs.  Family History:  Family History  Problem Relation Age of Onset  . Heart attack Father      Prior to Admission medications   Medication Sig Start Date End Date Taking? Authorizing Provider  acetaminophen (TYLENOL) 325 MG tablet Take 325-650 mg by mouth every 6 (six) hours as needed for headache (or pain).   Yes Historical Provider, MD  aspirin 81 MG tablet Take 81 mg by mouth daily.   Yes Historical Provider, MD  atorvastatin (LIPITOR) 20 MG tablet Take 20 mg by mouth at bedtime.   Yes Historical Provider, MD  Cholecalciferol (VITAMIN D-3) 1000  UNITS CAPS Take 1,000 Units by mouth daily.   Yes Historical Provider, MD  latanoprost (XALATAN) 0.005 % ophthalmic solution Place 1 drop into both eyes at bedtime.   Yes Historical Provider, MD  lisinopril (PRINIVIL,ZESTRIL) 20 MG tablet Take 20 mg by mouth every morning.   Yes Historical Provider, MD  Multiple Vitamins-Minerals  (ONE-A-DAY MENS 50+ ADVANTAGE) TABS Take 1 tablet by mouth every morning.   Yes Historical Provider, MD  omeprazole (PRILOSEC) 20 MG capsule Take 20 mg by mouth daily.   Yes Historical Provider, MD  ondansetron (ZOFRAN ODT) 4 MG disintegrating tablet Take 1 tablet (4 mg total) by mouth every 8 (eight) hours as needed for nausea or vomiting. 04/03/16  Yes Loletha Grayer, MD  predniSONE (DELTASONE) 5 MG tablet Take 5 mg by mouth daily. 05/15/14  Yes Historical Provider, MD  sucralfate (CARAFATE) 1 GM/10ML suspension Take 10 mLs by mouth at bedtime.   Yes Historical Provider, MD  predniSONE (DELTASONE) 1 MG tablet Take 1 tablet (1 mg total) by mouth daily. Patient not taking: Reported on 04/14/2016 04/03/16   Loletha Grayer, MD    Physical Exam: Vitals:   04/14/16 2230 04/15/16 0013 04/15/16 0055 04/15/16 0424  BP:  107/63 (!) 128/58 127/65  Pulse: 80 73 67 64  Resp: 23 23 20 20   Temp:   97.9 F (36.6 C) 97.9 F (36.6 C)  TempSrc:   Oral Oral  SpO2: 92% 95% 97%   Weight:    88.2 kg (194 lb 6.4 oz)  Height:    5\' 9"  (1.753 m)   General: Not in acute distress HEENT:       Eyes: PERRL, EOMI, no scleral icterus.       ENT: No discharge from the ears and nose, no pharynx injection, no tonsillar enlargement.        Neck: No JVD, no bruit, no mass felt. Heme: No neck lymph node enlargement. Cardiac: S1/S2, RRR, No murmurs, No gallops or rubs. Respiratory:  No rales, wheezing, rhonchi or rubs. GI: Soft, nondistended, nontender, no rebound pain, no organomegaly, BS present. GU: No hematuria Ext: No pitting leg edema bilaterally. 2+DP/PT pulse bilaterally. Musculoskeletal: No joint deformities, No joint redness or warmth, no limitation of ROM in spin. Skin: No rashes.  Neuro: Alert, oriented X3, cranial nerves II-XII grossly intact, moves all extremities normally.  Psych: Patient is not psychotic, no suicidal or hemocidal ideation.  Labs on Admission: I have personally reviewed following labs  and imaging studies  CBC:  Recent Labs Lab 04/14/16 1207  WBC 10.3  HGB 15.3  HCT 43.5  MCV 90.4  PLT 99991111   Basic Metabolic Panel:  Recent Labs Lab 04/14/16 1207  NA 136  K 3.4*  CL 100*  CO2 25  GLUCOSE 114*  BUN 20  CREATININE 1.32*  CALCIUM 8.8*   GFR: Estimated Creatinine Clearance: 49.9 mL/min (by C-G formula based on SCr of 1.32 mg/dL (H)). Liver Function Tests:  Recent Labs Lab 04/14/16 1633  AST 22  ALT 15*  ALKPHOS 73  BILITOT 0.2*  PROT 6.5  ALBUMIN 3.1*    Recent Labs Lab 04/14/16 1633  LIPASE 29   No results for input(s): AMMONIA in the last 168 hours. Coagulation Profile: No results for input(s): INR, PROTIME in the last 168 hours. Cardiac Enzymes:  Recent Labs Lab 04/14/16 2243  CKTOTAL 25*   BNP (last 3 results) No results for input(s): PROBNP in the last 8760 hours. HbA1C: No results for input(s): HGBA1C in  the last 72 hours. CBG:  Recent Labs Lab 04/14/16 1224  GLUCAP 117*   Lipid Profile: No results for input(s): CHOL, HDL, LDLCALC, TRIG, CHOLHDL, LDLDIRECT in the last 72 hours. Thyroid Function Tests: No results for input(s): TSH, T4TOTAL, FREET4, T3FREE, THYROIDAB in the last 72 hours. Anemia Panel: No results for input(s): VITAMINB12, FOLATE, FERRITIN, TIBC, IRON, RETICCTPCT in the last 72 hours. Urine analysis:    Component Value Date/Time   COLORURINE YELLOW 04/14/2016 1900   APPEARANCEUR CLEAR 04/14/2016 1900   APPEARANCEUR Clear 01/07/2014 1435   LABSPEC 1.028 04/14/2016 1900   LABSPEC 1.020 01/07/2014 1435   PHURINE 5.0 04/14/2016 1900   GLUCOSEU NEGATIVE 04/14/2016 1900   GLUCOSEU Negative 01/07/2014 1435   HGBUR NEGATIVE 04/14/2016 1900   BILIRUBINUR NEGATIVE 04/14/2016 1900   BILIRUBINUR Negative 01/07/2014 1435   KETONESUR NEGATIVE 04/14/2016 1900   PROTEINUR NEGATIVE 04/14/2016 1900   NITRITE NEGATIVE 04/14/2016 1900   LEUKOCYTESUR NEGATIVE 04/14/2016 1900   LEUKOCYTESUR Negative 01/07/2014 1435    Sepsis Labs: @LABRCNTIP (procalcitonin:4,lacticidven:4) )No results found for this or any previous visit (from the past 240 hour(s)).   Radiological Exams on Admission: Dg Chest 2 View  Result Date: 04/14/2016 CLINICAL DATA:  82 y/o  M; concern for pneumonia. EXAM: CHEST  2 VIEW COMPARISON:  05/08/2004 chest CT FINDINGS: Stable cardiac silhouette. Consolidation in the left upper lobe lingula. No pleural effusion. Mild degenerative changes of thoracic spine. Cholecystectomy clips. IMPRESSION: Consolidation in the left upper lobe lingula probably represents pneumonia. Follow-up to resolution is recommended with radiographs to exclude an underlying lesion. These results were called by telephone at the time of interpretation on 04/14/2016 at 8:17 pm to Dr. Nanda Quinton , who verbally acknowledged these results. Electronically Signed   By: Kristine Garbe M.D.   On: 04/14/2016 20:18   Ct Abdomen Pelvis W Contrast  Addendum Date: 04/14/2016   ADDENDUM REPORT: 04/14/2016 18:35 ADDENDUM: Study discussed by telephone with Dr. Vonna Kotyk LONG on 04/14/2016 at 1805 hours. Electronically Signed   By: Genevie Ann M.D.   On: 04/14/2016 18:35   Result Date: 04/14/2016 CLINICAL DATA:  82 year old male with unintentional weight loss since December. Epigastric abdominal pain. Increased weakness since hospitalization at [facility] in late December. Initial encounter. Personal history of prostate cancer. EXAM: CT ABDOMEN AND PELVIS WITH CONTRAST TECHNIQUE: Multidetector CT imaging of the abdomen and pelvis was performed using the standard protocol following bolus administration of intravenous contrast. CONTRAST:  156mL ISOVUE-300 IOPAMIDOL (ISOVUE-300) INJECTION 61% I was called by CT to examine this patient for contrast extravasation to the left upper extremity at 1715 hours. There is intravenous contrast present on the study. 40 mL to at most 60 mL of IV contrast extravasated into the proximal left  ventral arm. The patient was in no distress. There was ecchymosis throughout the extravasation area which was swollen, indicating a degree of superimposed hematoma. I reviewed the natural history of contrast extravasation and advised the patient to keep the extremity elevated, use ice, and over the counter are anti-inflammatory medicine. I recommended he return to the ED for any increasing left upper extremity pain or skin changes. COMPARISON:  CT Abdomen and Pelvis 01/07/2014. FINDINGS: Lower chest: Partially visible 3-4 cm mass like area in the left lingula (series 4, image 1). Surrounding reticulonodular density in both the lingula and the left lower lobe (series 4, image 4). No associated left pleural effusion. Right lung base is stable since 2015 and negative. No right pleural effusion. No  pericardial effusion. No upper abdominal free air. Intermittent mild motion artifact in the abdomen. Hepatobiliary: Surgically absent gallbladder as before. Stable intra and extrahepatic biliary tree. Negative liver. Pancreas: Negative. Spleen: Negative. No abdominal free fluid. Adrenals/Urinary Tract: Normal adrenal glands. Bilateral renal enhancement and contrast excretion is normal. No hydronephrosis. No urologic calculus identified. Unremarkable urinary bladder. Stomach/Bowel: Negative rectum. Retained stool mixed with contrast in the distal colon. Redundant sigmoid colon with diverticulosis, but no active inflammation identified. Negative left colon. Dense retained oral contrast at the splenic flexure and throughout the transverse colon which otherwise is negative. Similar dense retained contrast in the right colon. Negative right colon and appendix. Negative terminal ileum. No dilated small bowel. Decompressed stomach. Negative duodenum. Vascular/Lymphatic: Aortoiliac calcified atherosclerosis noted. Major arterial structures in the abdomen and pelvis are patent. Portal venous system is patent. No lymphadenopathy.  Reproductive: Prostate brachytherapy changes. Small fat containing left inguinal hernia is stable. Other: No pelvic free fluid. Musculoskeletal: Degenerative changes in the spine. No acute or suspicious osseous lesion identified. IMPRESSION: 1. IV contrast extravasation occurred along with some hematoma into the patient's left upper extremity as detailed in the Contrast section above. Nonetheless adequate intravenous contrast on the study. 2. Partially visible 3-4 cm mass like opacity in the peripheral lingula with surrounding lingula and left lower lobe tree-in-bud type nodular pulmonary opacity. No associated pleural effusion. Recommend follow-up PA and lateral chest radiographs, but at this point favor a multilobar pneumonia over left lung bronchogenic carcinoma. Query fever. If infection is suspected clinically then Followup PA and lateral chest X-ray is recommended in 3-4 weeks following trial of antibiotic therapy to ensure resolution and exclude underlying malignancy. 3. No acute or inflammatory process identified in the abdomen or pelvis. Normal appendix. Diverticulosis of the sigmoid colon without active inflammation. 4.  Calcified aortic atherosclerosis. Electronically Signed: By: Genevie Ann M.D. On: 04/14/2016 17:55     EKG: Not done in ED, will get one.   Assessment/Plan Principal Problem:   Acute respiratory failure with hypoxia (HCC) Active Problems:   Myasthenia gravis (HCC)   ARF (acute renal failure) (HCC)   PNA (pneumonia)   Essential hypertension   HLD (hyperlipidemia)   GERD (gastroesophageal reflux disease)   Hypokalemia   Weakness   Acute respiratory failure with hypoxia ([place]): Etiology is not clear. CXR showed possible LUL PNA, but pt has no respiratory symptoms, no fever or leukocytosis, clinically does not seem to have pneumonia. CT abdomen/pelvis showed possible multifocal lobar pneumonia versus bronchogenic carcinoma. Given his significant weight loss, it is very  concerning for malignancy. Pt received one dose of vancomycin, cefepime and azithromycin, will hold Abx now. Pt has AKI-->willy hydrate pt and then get CT-chest with contrast in AM (not ordered yet).   -will place pt on med-surg bed for obs -hold Abx now. -pt has AKI, will hydrate pt and then get CT-chest with contrast in AM (not ordered yet).   Myasthenia gravis-right eye only per pt -continue home dose of prednisone 5 mg daily -Solu Cortef 50 mg 1 as stress dose -Check cortisol level  AKI: cre 1.32. Likely due to prerenal secondary to dehydration and continuation of ACEI - IVF: 2L NS and then 100 cc/h - Check FeNa  - Follow up renal function by BMP - Hold lisinopril  HTN: Blood pressure 103/51 -Hold lisinopril -IV hydralazine when necessary  HLD: Last LDL was not on record -Continue home medications: Lipitor  GERD: -Protonix  Hypokalemia: K= 3.4 on admission. - Repleted  Weakness and weight loss: concerning for lung cancer -see above -f/u cortisol level to r/o adranl insufficiency -check CK level  DVT ppx: SQ Lovenox Code Status: Full code Family Communication: None at bed side.   Disposition Plan:  Anticipate discharge back to previous home environment Consults called:  none Admission status:  medical floor/obs   Date of Service 04/15/2016    Ivor Costa [facility] Pager [phone]  If 7PM-7AM, please contact night-coverage [email or web address] Password [facility] 04/15/2016, 4:43 AM

## 2016-04-14 NOTE — Progress Notes (Signed)
Pharmacy Antibiotic Note  Elijah Misencik Sr. is a 82 y.o. male admitted on 04/14/2016 with lack of appetite and weakness. Pt was recently admitted < 2 weeks ago for symptoms deemed to be due to gastroenteritis. Family brought pt back due to persistent fatigue.   Plan: -Vancomycin 1500 mg IV x1 then 750/12h -Cefepime 2 g IV q24h -Monitor renal fx, cultures, VT as needed    Temp (24hrs), Avg:97.5 F (36.4 C), Min:97 F (36.1 C), Max:97.8 F (36.6 C)   Recent Labs Lab 04/14/16 1207  WBC 10.3  CREATININE 1.32*     Antimicrobials this admission: 1/9 vancomycin > 1/9 cefepime >  Dose adjustments this admission: N/A  Microbiology results: None at this time  Thank you for allowing pharmacy to be a part of this patient's care.  Harvel Quale 04/14/2016 8:38 PM

## 2016-04-14 NOTE — ED Notes (Addendum)
Unable to start IV put in IV team consult. Unable to start antibiotics at this time

## 2016-04-14 NOTE — ED Notes (Signed)
Patient transported to X-ray 

## 2016-04-14 NOTE — ED Notes (Signed)
Attempted to start antibiotics and unable to flush Iv, IV removed and called charge nurse to attempt IV access

## 2016-04-14 NOTE — ED Notes (Signed)
Pt can eat per Dr Laverta [place]

## 2016-04-14 NOTE — ED Triage Notes (Addendum)
Pt brought in by daughter with c/o lack of appetite and weakness since he was discharged from [facility] on Dec 29th; he was seen for n/v/d and fever of 104 at that time. Since his d/c he hasnt had any more fevers, vomiting or diarrhea but just has not had an appetite. She states he has been more weak that normal and "has been in the bed for the past three days and can barely walk." This is not normal for patient. He went to [facility] this morning and was referred here for IV fluids and CT scan of abdomen. Denies bloody stools. Pt states he has lost 10 lbs since Dec 29th.

## 2016-04-14 NOTE — ED Notes (Signed)
Pt states has not eaten whole meal in 10 days, feels nauseated but no episodes of vomiting or fever since discharge from previous facility

## 2016-04-15 ENCOUNTER — Observation Stay (HOSPITAL_COMMUNITY): Payer: Medicare Other

## 2016-04-15 DIAGNOSIS — J189 Pneumonia, unspecified organism: Secondary | ICD-10-CM | POA: Diagnosis present

## 2016-04-15 DIAGNOSIS — I2699 Other pulmonary embolism without acute cor pulmonale: Secondary | ICD-10-CM | POA: Diagnosis not present

## 2016-04-15 DIAGNOSIS — I10 Essential (primary) hypertension: Secondary | ICD-10-CM

## 2016-04-15 DIAGNOSIS — E785 Hyperlipidemia, unspecified: Secondary | ICD-10-CM | POA: Diagnosis present

## 2016-04-15 DIAGNOSIS — G7000 Myasthenia gravis without (acute) exacerbation: Secondary | ICD-10-CM

## 2016-04-15 DIAGNOSIS — J9601 Acute respiratory failure with hypoxia: Secondary | ICD-10-CM | POA: Diagnosis present

## 2016-04-15 DIAGNOSIS — K2970 Gastritis, unspecified, without bleeding: Secondary | ICD-10-CM | POA: Diagnosis present

## 2016-04-15 DIAGNOSIS — K210 Gastro-esophageal reflux disease with esophagitis: Secondary | ICD-10-CM | POA: Diagnosis present

## 2016-04-15 DIAGNOSIS — E876 Hypokalemia: Secondary | ICD-10-CM

## 2016-04-15 DIAGNOSIS — Z8546 Personal history of malignant neoplasm of prostate: Secondary | ICD-10-CM | POA: Diagnosis not present

## 2016-04-15 DIAGNOSIS — K209 Esophagitis, unspecified: Secondary | ICD-10-CM | POA: Diagnosis not present

## 2016-04-15 DIAGNOSIS — I2692 Saddle embolus of pulmonary artery without acute cor pulmonale: Secondary | ICD-10-CM | POA: Diagnosis present

## 2016-04-15 DIAGNOSIS — R531 Weakness: Secondary | ICD-10-CM | POA: Diagnosis present

## 2016-04-15 DIAGNOSIS — Z9079 Acquired absence of other genital organ(s): Secondary | ICD-10-CM | POA: Diagnosis not present

## 2016-04-15 DIAGNOSIS — N179 Acute kidney failure, unspecified: Secondary | ICD-10-CM

## 2016-04-15 DIAGNOSIS — I824Z1 Acute embolism and thrombosis of unspecified deep veins of right distal lower extremity: Secondary | ICD-10-CM | POA: Diagnosis present

## 2016-04-15 DIAGNOSIS — H409 Unspecified glaucoma: Secondary | ICD-10-CM | POA: Diagnosis present

## 2016-04-15 DIAGNOSIS — Z9849 Cataract extraction status, unspecified eye: Secondary | ICD-10-CM | POA: Diagnosis not present

## 2016-04-15 DIAGNOSIS — Z7952 Long term (current) use of systemic steroids: Secondary | ICD-10-CM | POA: Diagnosis not present

## 2016-04-15 DIAGNOSIS — Z6829 Body mass index (BMI) 29.0-29.9, adult: Secondary | ICD-10-CM | POA: Diagnosis not present

## 2016-04-15 DIAGNOSIS — I951 Orthostatic hypotension: Secondary | ICD-10-CM | POA: Diagnosis present

## 2016-04-15 DIAGNOSIS — Y95 Nosocomial condition: Secondary | ICD-10-CM | POA: Diagnosis present

## 2016-04-15 DIAGNOSIS — Z87891 Personal history of nicotine dependence: Secondary | ICD-10-CM | POA: Diagnosis not present

## 2016-04-15 DIAGNOSIS — E669 Obesity, unspecified: Secondary | ICD-10-CM | POA: Diagnosis present

## 2016-04-15 DIAGNOSIS — E860 Dehydration: Secondary | ICD-10-CM | POA: Diagnosis present

## 2016-04-15 DIAGNOSIS — Z79899 Other long term (current) drug therapy: Secondary | ICD-10-CM | POA: Diagnosis not present

## 2016-04-15 DIAGNOSIS — I82432 Acute embolism and thrombosis of left popliteal vein: Secondary | ICD-10-CM | POA: Diagnosis present

## 2016-04-15 DIAGNOSIS — K219 Gastro-esophageal reflux disease without esophagitis: Secondary | ICD-10-CM | POA: Diagnosis not present

## 2016-04-15 DIAGNOSIS — I82412 Acute embolism and thrombosis of left femoral vein: Secondary | ICD-10-CM | POA: Diagnosis present

## 2016-04-15 LAB — BASIC METABOLIC PANEL
Anion gap: 6 (ref 5–15)
BUN: 19 mg/dL (ref 6–20)
CALCIUM: 8 mg/dL — AB (ref 8.9–10.3)
CO2: 23 mmol/L (ref 22–32)
Chloride: 110 mmol/L (ref 101–111)
Creatinine, Ser: 1.08 mg/dL (ref 0.61–1.24)
GFR calc Af Amer: >60 mL/min (ref >60)
GLUCOSE: 106 mg/dL — AB (ref 65–99)
Potassium: 3.8 mmol/L (ref 3.5–5.1)
Sodium: 139 mmol/L (ref 135–145)

## 2016-04-15 LAB — SODIUM, URINE, RANDOM: Sodium, Ur: 23 mmol/L

## 2016-04-15 LAB — CBC
HCT: 37.5 % — ABNORMAL LOW (ref 39.0–52.0)
HEMOGLOBIN: 12.9 g/dL — AB (ref 13.0–17.0)
MCH: 31.3 pg (ref 26.0–34.0)
MCHC: 34.4 g/dL (ref 30.0–36.0)
MCV: 91 fL (ref 78.0–100.0)
Platelets: 279 10*3/uL (ref 150–400)
RBC: 4.12 MIL/uL — ABNORMAL LOW (ref 4.22–5.81)
RDW: 13.1 % (ref 11.5–15.5)
WBC: 9.5 10*3/uL (ref 4.0–10.5)

## 2016-04-15 LAB — INFLUENZA PANEL BY PCR (TYPE A & B)
INFLAPCR: NEGATIVE
INFLBPCR: NEGATIVE

## 2016-04-15 LAB — HIV ANTIBODY (ROUTINE TESTING W REFLEX): HIV Screen 4th Generation wRfx: NONREACTIVE

## 2016-04-15 LAB — CREATININE, URINE, RANDOM: Creatinine, Urine: 97.59 mg/dL

## 2016-04-15 LAB — STREP PNEUMONIAE URINARY ANTIGEN: STREP PNEUMO URINARY ANTIGEN: NEGATIVE

## 2016-04-15 LAB — CORTISOL-AM, BLOOD: Cortisol - AM: 19.7 ug/dL (ref 6.7–22.6)

## 2016-04-15 MED ORDER — HEPARIN BOLUS VIA INFUSION
4000.0000 [IU] | Freq: Once | INTRAVENOUS | Status: AC
  Administered 2016-04-15: 4000 [IU] via INTRAVENOUS
  Filled 2016-04-15 (×2): qty 4000

## 2016-04-15 MED ORDER — AMPICILLIN-SULBACTAM SODIUM 3 (2-1) G IJ SOLR
3.0000 g | Freq: Four times a day (QID) | INTRAMUSCULAR | Status: DC
  Administered 2016-04-15 – 2016-04-17 (×7): 3 g via INTRAVENOUS
  Filled 2016-04-15 (×9): qty 3

## 2016-04-15 MED ORDER — HEPARIN (PORCINE) IN NACL 100-0.45 UNIT/ML-% IJ SOLN
1400.0000 [IU]/h | INTRAMUSCULAR | Status: DC
  Administered 2016-04-15: 1400 [IU]/h via INTRAVENOUS
  Filled 2016-04-15 (×2): qty 250

## 2016-04-15 MED ORDER — PANTOPRAZOLE SODIUM 40 MG PO TBEC
40.0000 mg | DELAYED_RELEASE_TABLET | Freq: Two times a day (BID) | ORAL | Status: DC
  Administered 2016-04-15 – 2016-04-17 (×4): 40 mg via ORAL
  Filled 2016-04-15 (×4): qty 1

## 2016-04-15 MED ORDER — HYDRALAZINE HCL 20 MG/ML IJ SOLN: 5.0000 mg | INTRAMUSCULAR | Status: DC | PRN

## 2016-04-15 MED ORDER — ENSURE ENLIVE PO LIQD
237.0000 mL | Freq: Two times a day (BID) | ORAL | Status: DC
  Administered 2016-04-15 – 2016-04-17 (×5): 237 mL via ORAL

## 2016-04-15 MED ORDER — IOPAMIDOL (ISOVUE-300) INJECTION 61%
INTRAVENOUS | Status: AC
  Administered 2016-04-15: 75 mL
  Filled 2016-04-15: qty 75

## 2016-04-15 NOTE — Progress Notes (Signed)
IV in left forearm with NS infusion, infiltrated and DC'd.  Site with slight swelling.

## 2016-04-15 NOTE — Consult Note (Addendum)
Name: Elijah Vasilopoulos Sr. MRN: [id number] DOB: Jun 15, 1934    ADMISSION DATE:  04/14/2016 CONSULTATION DATE:  04/15/16  REFERRING MD :  Wynelle Cleveland  CHIEF COMPLAINT:  Weakness   HISTORY OF PRESENT ILLNESS:  Elijah Hoots Sr. is a 82 y.o. male with a PMH as outlined below.  He had recent hospitalization 12/28 through 12/29 due to acute gastroenteritis. Since discharge, he has had generalized weakness along with decreased oral intake. He has lost apparently 10 pounds since 12/29.   He came to [facility] ED 01/09 due to persistent symptoms. He had CXR which showed LUL infiltrate. CT of abdomen/pelvis was negative for any acute process but did partially show masslike structure in the lingula/LLL.  The following morning, he had CT of the chest which had incidental finding of bilateral PE with evidence of right heart strain. There was also a cavitary mass versus pneumonia in the left upper lobe measuring 4.2 x 2.3 x 3.2 cm. PCCM was subsequently called for further evaluation.  He informs [place] that over the past month or so with his generalized weakness, he has also been "coughing like I had pneumonia". He has not been on any antibiotics. He denies any fevers/chills/sweats, chest pain, headache. He does not have any history of DVT, denies any hemoptysis, LE edema. He does have a history of prostate cancer status post TURP.  PAST MEDICAL HISTORY :   has a past medical history of Dyslipidemia; GERD (gastroesophageal reflux disease); Glaucoma; Hyperlipidemia; Hypertension; Insomnia; Myasthenia gravis ([place]) (09/08/2012); Obesity; Ocular myasthenia gravis ([place]); and Prostate cancer ([place]).  has a past surgical history that includes Transurethral resection of prostate; Cataract extraction (Bilateral); and Cholecystectomy. Prior to Admission medications   Medication Sig Start Date End Date Taking? Authorizing Provider  acetaminophen (TYLENOL) 325 MG tablet Take 325-650 mg by mouth every 6 (six) hours as needed for  headache (or pain).   Yes Historical Provider, MD  aspirin 81 MG tablet Take 81 mg by mouth daily.   Yes Historical Provider, MD  atorvastatin (LIPITOR) 20 MG tablet Take 20 mg by mouth at bedtime.   Yes Historical Provider, MD  Cholecalciferol (VITAMIN D-3) 1000 UNITS CAPS Take 1,000 Units by mouth daily.   Yes Historical Provider, MD  latanoprost (XALATAN) 0.005 % ophthalmic solution Place 1 drop into both eyes at bedtime.   Yes Historical Provider, MD  lisinopril (PRINIVIL,ZESTRIL) 20 MG tablet Take 20 mg by mouth every morning.   Yes Historical Provider, MD  Multiple Vitamins-Minerals (ONE-A-DAY MENS 50+ ADVANTAGE) TABS Take 1 tablet by mouth every morning.   Yes Historical Provider, MD  omeprazole (PRILOSEC) 20 MG capsule Take 20 mg by mouth daily.   Yes Historical Provider, MD  ondansetron (ZOFRAN ODT) 4 MG disintegrating tablet Take 1 tablet (4 mg total) by mouth every 8 (eight) hours as needed for nausea or vomiting. 04/03/16  Yes Loletha Grayer, MD  predniSONE (DELTASONE) 5 MG tablet Take 5 mg by mouth daily. 05/15/14  Yes Historical Provider, MD  sucralfate (CARAFATE) 1 GM/10ML suspension Take 10 mLs by mouth at bedtime.   Yes Historical Provider, MD  predniSONE (DELTASONE) 1 MG tablet Take 1 tablet (1 mg total) by mouth daily. Patient not taking: Reported on 04/14/2016 04/03/16   Loletha Grayer, MD   No Known Allergies  FAMILY HISTORY:  family history includes Heart attack in his father. SOCIAL HISTORY:  reports that he quit smoking about 46 years ago. He has never used smokeless tobacco. He reports that he drinks alcohol. He  reports that he does not use drugs.  REVIEW OF SYSTEMS:   All negative; except for those that are bolded, which indicate positives.  Constitutional: weight loss, weight gain, night sweats, fevers, chills, fatigue, weakness.  HEENT: headaches, sore throat, sneezing, nasal congestion, post nasal drip, difficulty swallowing, tooth/dental problems, visual  complaints, visual changes, ear aches. Neuro: difficulty with speech, weakness, numbness, ataxia. CV:  chest pain, orthopnea, PND, swelling in lower extremities, dizziness, palpitations, syncope.  Resp: cough, hemoptysis, dyspnea, wheezing. GI: heartburn, indigestion, abdominal pain, nausea, vomiting, diarrhea, constipation, change in bowel habits, loss of appetite, hematemesis, melena, hematochezia.  GU: dysuria, change in color of urine, urgency or frequency, flank pain, hematuria. MSK: joint pain or swelling, decreased range of motion. Psych: change in mood or affect, depression, anxiety, suicidal ideations, homicidal ideations. Skin: rash, itching, bruising.   SUBJECTIVE:  Denies any SOB, chest pain.  Is understandably anxious regarding cavitary mass in LUL.  VITAL SIGNS: Temp:  [97.9 F (36.6 C)-98.4 F (36.9 C)] 98.4 F (36.9 C) (01/10 1500) Pulse Rate:  [64-80] 77 (01/10 1500) Resp:  [18-24] 18 (01/10 1500) BP: (107-141)/(58-77) 141/74 (01/10 1500) SpO2:  [92 %-97 %] 97 % (01/10 1500) Weight:  [194 lb 6.4 oz (88.2 kg)] 194 lb 6.4 oz (88.2 kg) (01/10 0424)  PHYSICAL EXAMINATION: General: Adult male, resting in bed, in NAD. Neuro: A&O x 3, non-focal.  HEENT: [place]/AT. PERRL, sclerae anicteric. Cardiovascular: RRR, no M/R/G.  Lungs: Respirations even and unlabored.  CTA bilaterally, No W/R/R. Abdomen: BS x 4, soft, NT/ND.  Musculoskeletal: No gross deformities, no edema.  Skin: Intact, warm, no rashes.     Recent Labs Lab 04/14/16 1207 04/15/16 0818  NA 136 139  K 3.4* 3.8  CL 100* 110  CO2 25 23  BUN 20 19  CREATININE 1.32* 1.08  GLUCOSE 114* 106*    Recent Labs Lab 04/14/16 1207 04/15/16 0818  HGB 15.3 12.9*  HCT 43.5 37.5*  WBC 10.3 9.5  PLT 277 279   Dg Chest 2 View  Result Date: 04/14/2016 CLINICAL DATA:  82 y/o  M; concern for pneumonia. EXAM: CHEST  2 VIEW COMPARISON:  05/08/2004 chest CT FINDINGS: Stable cardiac silhouette. Consolidation in the  left upper lobe lingula. No pleural effusion. Mild degenerative changes of thoracic spine. Cholecystectomy clips. IMPRESSION: Consolidation in the left upper lobe lingula probably represents pneumonia. Follow-up to resolution is recommended with radiographs to exclude an underlying lesion. These results were called by telephone at the time of interpretation on 04/14/2016 at 8:17 pm to Dr. Nanda Quinton , who verbally acknowledged these results. Electronically Signed   By: Kristine Garbe M.D.   On: 04/14/2016 20:18   Ct Chest W Contrast  Result Date: 04/15/2016 CLINICAL DATA:  Pneumonia versus mass, abnormal chest radiograph EXAM: CT CHEST WITH CONTRAST TECHNIQUE: Multidetector CT imaging of the chest was performed during intravenous contrast administration. Sagittal and coronal MPR images reconstructed from axial data set. CONTRAST:  ISOVUE-300 IOPAMIDOL (ISOVUE-300) INJECTION 61% IV COMPARISON:  05/08/2004; correlation chest radiograph 04/14/2016 FINDINGS: Cardiovascular: Atherosclerotic calcifications aorta and coronary arteries. Aorta normal caliber. No pericardial effusion. Filling defects identified in LEFT lower lobe pulmonary arteries and at the bifurcation of the RIGHT pulmonary artery extending into RIGHT middle and RIGHT lower lobes compatible with large BILATERAL pulmonary emboli. RV:LV ratio = 1.11 Mediastinum/Nodes: Esophagus unremarkable. Base of cervical region normal appearance. Few scattered normal sized mediastinal lymph nodes without thoracic adenopathy. Lungs/Pleura: Masslike area of opacity with cavitation identified in the lingula,  4.2 x 2.3 x 3.2 cm, question neoplasm versus cavitary pneumonia. Scattered mild reticulonodular infiltrates in lingula and LEFT lower lobe. Focus of infiltrate laterally in the posterior RIGHT upper lobe adjacent to major fissure. Central peribronchial thickening. Minimal atelectasis or scarring in RIGHT middle lobe. No additional pulmonary mass identified.  No significant pleural fluid collection. Upper Abdomen: Gallbladder surgically absent. Visualized upper abdomen otherwise normal. Musculoskeletal: Demineralized.  No focal osseous abnormalities. IMPRESSION: BILATERAL pulmonary emboli, including in LEFT lower lobe and a saddle embolus at the RIGHT pulmonary artery bifurcation. Positive for acute PE with CT evidence of right heart strain (RV/LV Ratio = ) consistent with at least submassive (intermediate risk) PE. The presence of right heart strain has been associated with an increased risk of morbidity and mortality. Please activate Code PE by paging [phone]. Cavitary mass versus pneumonia in the LEFT upper lobe/ lingula, 4.2 x 2.3 x 3.2 cm; followup assessment until resolution recommended to exclude neoplasm. Reticulonodular infiltrates in lingula and LEFT lower lobe. Aortic atherosclerosis and coronary arterial calcification. Critical Value/emergent results were called by telephone at the time of interpretation on 04/15/2016 at 1508 hr to Dr. Debbe Odea , who verbally acknowledged these results. Electronically Signed   By: Lavonia Dana M.D.   On: 04/15/2016 15:12   Ct Abdomen Pelvis W Contrast  Addendum Date: 04/14/2016   ADDENDUM REPORT: 04/14/2016 18:35 ADDENDUM: Study discussed by telephone with Dr. Vonna Kotyk LONG on 04/14/2016 at 1805 hours. Electronically Signed   By: Genevie Ann M.D.   On: 04/14/2016 18:35   Result Date: 04/14/2016 CLINICAL DATA:  82 year old male with unintentional weight loss since December. Epigastric abdominal pain. Increased weakness since hospitalization at [facility] in late December. Initial encounter. Personal history of prostate cancer. EXAM: CT ABDOMEN AND PELVIS WITH CONTRAST TECHNIQUE: Multidetector CT imaging of the abdomen and pelvis was performed using the standard protocol following bolus administration of intravenous contrast. CONTRAST:  160mL ISOVUE-300 IOPAMIDOL (ISOVUE-300) INJECTION 61% I was called  by CT to examine this patient for contrast extravasation to the left upper extremity at 1715 hours. There is intravenous contrast present on the study. 40 mL to at most 60 mL of IV contrast extravasated into the proximal left ventral arm. The patient was in no distress. There was ecchymosis throughout the extravasation area which was swollen, indicating a degree of superimposed hematoma. I reviewed the natural history of contrast extravasation and advised the patient to keep the extremity elevated, use ice, and over the counter are anti-inflammatory medicine. I recommended he return to the ED for any increasing left upper extremity pain or skin changes. COMPARISON:  CT Abdomen and Pelvis 01/07/2014. FINDINGS: Lower chest: Partially visible 3-4 cm mass like area in the left lingula (series 4, image 1). Surrounding reticulonodular density in both the lingula and the left lower lobe (series 4, image 4). No associated left pleural effusion. Right lung base is stable since 2015 and negative. No right pleural effusion. No pericardial effusion. No upper abdominal free air. Intermittent mild motion artifact in the abdomen. Hepatobiliary: Surgically absent gallbladder as before. Stable intra and extrahepatic biliary tree. Negative liver. Pancreas: Negative. Spleen: Negative. No abdominal free fluid. Adrenals/Urinary Tract: Normal adrenal glands. Bilateral renal enhancement and contrast excretion is normal. No hydronephrosis. No urologic calculus identified. Unremarkable urinary bladder. Stomach/Bowel: Negative rectum. Retained stool mixed with contrast in the distal colon. Redundant sigmoid colon with diverticulosis, but no active inflammation identified. Negative left colon. Dense retained oral contrast at the splenic flexure  and throughout the transverse colon which otherwise is negative. Similar dense retained contrast in the right colon. Negative right colon and appendix. Negative terminal ileum. No dilated small bowel.  Decompressed stomach. Negative duodenum. Vascular/Lymphatic: Aortoiliac calcified atherosclerosis noted. Major arterial structures in the abdomen and pelvis are patent. Portal venous system is patent. No lymphadenopathy. Reproductive: Prostate brachytherapy changes. Small fat containing left inguinal hernia is stable. Other: No pelvic free fluid. Musculoskeletal: Degenerative changes in the spine. No acute or suspicious osseous lesion identified. IMPRESSION: 1. IV contrast extravasation occurred along with some hematoma into the patient's left upper extremity as detailed in the Contrast section above. Nonetheless adequate intravenous contrast on the study. 2. Partially visible 3-4 cm mass like opacity in the peripheral lingula with surrounding lingula and left lower lobe tree-in-bud type nodular pulmonary opacity. No associated pleural effusion. Recommend follow-up PA and lateral chest radiographs, but at this point favor a multilobar pneumonia over left lung bronchogenic carcinoma. Query fever. If infection is suspected clinically then Followup PA and lateral chest X-ray is recommended in 3-4 weeks following trial of antibiotic therapy to ensure resolution and exclude underlying malignancy. 3. No acute or inflammatory process identified in the abdomen or pelvis. Normal appendix. Diverticulosis of the sigmoid colon without active inflammation. 4.  Calcified aortic atherosclerosis. Electronically Signed: By: Genevie Ann M.D. On: 04/14/2016 17:55    STUDIES:  CT A/P 01/09 > no acute process. Partially visible 3-4 cm masslike opacity in the periphery of lingula. CT chest 01/10 > bilateral PE, R >L. Cavitary mass versus pneumonia in the LUL/lingula, 4.2 x 2.3 x 3.2cm.  SIGNIFICANT EVENTS  01/09 > admit. 01/10 > PCCM consult.  ASSESSMENT / PLAN:  Acute bilateral PE - unclear etiology; but concern due to malignancy. Has history of prostate cancer status post TURP. Now also has cavitary mass in LUL/lingula that  although is not definitive for malignancy, certainly is highly suspicious. DDX includes infectious process (cannot rule out at this point). Acute hypoxic respiratory failure - due to above. Plan: Continue heparin drip.  Transition to oral agent, preferably Lovenox. Assess echo, LE duplex. May need IVC filter if mobile clot present. Start Unasyn empirically. Cultures sputum. Follow cultures. Pulmonary hygiene. Would repeat CT imaging in next 2 weeks or so, and if cavitary mass persists, would pursue CT-guided biopsy. Continue supplemental O2 as needed to maintain SpO2 > 92%.   Rest of care per primary team.   Montey Hora, [place] Pager: [phone]  or [phone] 04/15/2016, 5:46 PM  STAFF NOTE: I, Merrie Roof, MD FACP have personally reviewed patient's available data, including medical history, events of note, physical examination and test results as part of my evaluation. I have discussed with resident/NP and other care providers such as pharmacist, RN and RRT. In addition, I personally evaluated patient and elicited key findings of: alert, no distress, CTA, on RA, jvd wnl, no rt heeve, CT with PE rt distal main and some less on left, on heparin drip, no sig symptoms for infection and in this setting mass likely and unlikely infection, wold continued heparin, consider, polymicrobial abx coverage and re assessment imaging, however this likley is cancer and would need BX IR CT guided, this is not bronch bx, obtain echo to ensure no veg on tricuspid, will follow It is not safe to interrupt heparin for PE for bx at this time, see above rec for ABX and follow up imaging in  short period of time  Lavon Paganini. Titus Mould, MD, [place] Pgr: [facility] 04/15/2016 11:00 PM

## 2016-04-15 NOTE — Progress Notes (Signed)
OT Cancellation Note  Patient Details Name: Elijah Gonyer Sr. MRN: [id number] DOB: 1934/08/23   Cancelled Treatment:    Reason Eval/Treat Not Completed: Patient at procedure or test/ unavailable Pt off unit at CT.  Will follow as able.  Simonne Come 04/15/2016, 2:06 PM

## 2016-04-15 NOTE — Care Management Obs Status (Signed)
[facility] NOTIFICATION   Patient Details  Name: Elijah Spruiell Sr. MRN: [id number] Date of Birth: [birth year]   Medicare Observation Status Notification Given:  Yes    Ninfa Meeker, RN 04/15/2016, 3:08 PM

## 2016-04-15 NOTE — Progress Notes (Signed)
Initial Nutrition Assessment  DOCUMENTATION CODES:   Not applicable  INTERVENTION:  Continue Ensure Enlive po BID, each supplement provides 350 kcal and 20 grams of protein.  Encourage adequate PO intake.   NUTRITION DIAGNOSIS:   Increased nutrient needs related to acute illness as evidenced by estimated needs.  GOAL:   Patient will meet greater than or equal to 90% of their needs  MONITOR:   PO intake, Supplement acceptance, Labs, Weight trends, Skin, I & O's  REASON FOR ASSESSMENT:   Malnutrition Screening Tool    ASSESSMENT:   82 y.o. male with medical history significant of hypertension, hyperlipidemia, GERD, prostate cancer (s/p radiation seed), right eye myasthenia gravis, who presents with generalized weakness, decreased oral intake, nausea.  Meal completion has been 75-100%. Pt reports his appetite has improved recently and he has been eating well at meals. Pt does report poor po intake PTA which had been ongoing over the past 2 weeks. Pt reports he barely has eaten anything within that time frame. Pt does report drinking some fluids (water). Pt reports a 10 lb weight loss within the 2 week time frame. Pt with a reported 4.9% weight loss in 2 weeks (significant for time frame). Pt currently has Ensure ordered and has been consuming them. RD to continue with current orders. Pt encouraged to consume nutritional supplements at home especially if po intake becomes poor.   Pt with no observed significant fat or muscle mass loss.   Labs and medications reviewed.   Diet Order:  Diet Heart Room service appropriate? Yes; Fluid consistency: Thin  Skin:  Reviewed, no issues  Last BM:  Unknown  Height:   Ht Readings from Last 1 Encounters:  04/15/16 5\' 9"  (1.753 m)    Weight:   Wt Readings from Last 1 Encounters:  04/15/16 194 lb 6.4 oz (88.2 kg)    Ideal Body Weight:  72.7 kg  BMI:  Body mass index is 28.71 kg/m.  Estimated Nutritional Needs:   Kcal:   1900-2000  Protein:  80-90 grams  Fluid:  1.9 - 2 L/day  EDUCATION NEEDS:   No education needs identified at this time  Corrin Parker, MS, RD, LDN Pager # [phone] After hours/ weekend pager # [phone]

## 2016-04-15 NOTE — Progress Notes (Signed)
Physical Therapy Evaluation Patient Details Name: Elijah Vanepps Sr. MRN: [id number] DOB: 06-01-1934 Today's Date: 04/15/2016   History of Present Illness  Pt is a 82 y.o. male with medical history significant of hypertension, hyperlipidemia, GERD, prostate cancer (s/p radiation seed), right eye myasthenia gravis, who presents with generalized weakness, decreased oral intake, nausea. Currently being treated for acute respiratory failure with hypoxia  Clinical Impression  PTA, pt independent with all ADLs and mobility. Currently living with wife who works full time. Pt able to ambulate with modified independence to bathroom today. Slight imbalance with ambulation as he demonstrated decreased stride length and slowed, cautious gait. Improved with use of IV pole for balance. When offered RW pt declined, may possibly benefit from cane. Trial if still here tomorrow. Did not ambulate outside of room per RN request as pt was about to leave for CT. PT will continue to follow acutely to observe pt activity tolerance with further ambulation as well as challenge balance next session.     Follow Up Recommendations No PT follow up;Supervision - Intermittent    Equipment Recommendations  Other (comment) (TBD next session)    Recommendations for Other Services       Precautions / Restrictions Precautions Precautions: Fall Restrictions Weight Bearing Restrictions: No      Mobility  Bed Mobility Overal bed mobility: Modified Independent             General bed mobility comments: No physical assist required. HOB elevated  Transfers Overall transfer level: Modified independent Equipment used: None Transfers: Sit to/from Stand Sit to Stand: Independent         General transfer comment: No physical assist required. Able to power to stand without assistance and no assistive device  Ambulation/Gait Ambulation/Gait assistance: Supervision Ambulation Distance (Feet): 15 Feet Assistive  device:  (Held onto IV pole for balance declined use of RW) Gait Pattern/deviations: Step-through pattern;Decreased stride length Gait velocity: decreased Gait velocity interpretation: Below normal speed for age/gender General Gait Details: Slight imbalance with ambulation and demonstrates decreased stride length without support of IV pole. When offered RW, pt declined. Supervision for safety. Did not ambulate outside of room per RN request as pt was about to leave for CT scan  Stairs            Wheelchair Mobility    Modified Rankin (Stroke Patients Only)       Balance Overall balance assessment: Needs assistance Sitting-balance support: No upper extremity supported;Feet supported Sitting balance-Leahy Scale: Good     Standing balance support: No upper extremity supported;During functional activity Standing balance-Leahy Scale: Fair Standing balance comment: Benefits from outside support (declines RW but possibly trial cane next session) but is able to maintain standing balance without assist.                              Pertinent Vitals/Pain Pain Assessment: No/denies pain    Home Living Family/patient expects to be discharged to:: Private residence Living Arrangements: Spouse/significant other Available Help at Discharge: Family;Available PRN/intermittently (wife works during day) Type of Home: House Home Access: Stairs to enter Entrance Stairs-Rails: Right Entrance Stairs-Number of Steps: 2 Home Layout: One level Home Equipment: Environmental consultant - 2 wheels;Cane - single point;Crutches;Grab bars - tub/shower      Prior Function Level of Independence: Independent               Hand Dominance        Extremity/Trunk Assessment  Upper Extremity Assessment Upper Extremity Assessment: Overall WFL for tasks assessed    Lower Extremity Assessment Lower Extremity Assessment: Overall WFL for tasks assessed       Communication   Communication: No  difficulties  Cognition Arousal/Alertness: Awake/alert Behavior During Therapy: WFL for tasks assessed/performed Overall Cognitive Status: Within Functional Limits for tasks assessed                      General Comments      Exercises     Assessment/Plan    PT Assessment Patient needs continued PT services  PT Problem List Decreased activity tolerance;Decreased balance;Decreased mobility;Decreased knowledge of use of DME;Decreased safety awareness;Decreased knowledge of precautions          PT Treatment Interventions Gait training;Stair training;Functional mobility training;Therapeutic activities;Therapeutic exercise;Balance training;Patient/family education    PT Goals (Current goals can be found in the Care Plan section)  Acute Rehab PT Goals Patient Stated Goal: to get well PT Goal Formulation: With patient Time For Goal Achievement: 04/29/16 Potential to Achieve Goals: Good    Frequency Min 3X/week   Barriers to discharge        Co-evaluation               End of Session Equipment Utilized During Treatment: Gait belt Activity Tolerance: Patient tolerated treatment well Patient left: in bed;with call bell/phone within reach Nurse Communication: Mobility status    Functional Assessment Tool Used: clinical judgment Functional Limitation: Mobility: Walking and moving around Mobility: Walking and Moving Around Current Status [id number]): At least 1 percent but less than 20 percent impaired, limited or restricted Mobility: Walking and Moving Around Goal Status [phone]): 0 percent impaired, limited or restricted    Time: 1311-1327 PT Time Calculation (min) (ACUTE ONLY): 16 min   Charges:   PT Evaluation $PT Eval Low Complexity: 1 Procedure     PT G Codes:   PT G-Codes **NOT FOR INPATIENT CLASS** Functional Assessment Tool Used: clinical judgment Functional Limitation: Mobility: Walking and moving around Mobility: Walking and Moving Around Current  Status [id number]): At least 1 percent but less than 20 percent impaired, limited or restricted Mobility: Walking and Moving Around Goal Status [phone]): 0 percent impaired, limited or restricted    Tonia Brooms 04/15/2016, 2:08 PM Tonia Brooms, SPT [phone]

## 2016-04-15 NOTE — Progress Notes (Addendum)
ANTICOAGULATION CONSULT NOTE - Initial Consult  Pharmacy Consult for Heparin Indication: pulmonary embolus  No Known Allergies  Patient Measurements: Height: 5\' 9"  (175.3 cm) Weight: 194 lb 6.4 oz (88.2 kg) IBW/kg (Calculated) : 70.7 Heparin Dosing Weight: 88 kg  Vital Signs: Temp: 98.4 F (36.9 C) (01/10 1500) Temp Source: Oral (01/10 1500) BP: 141/74 (01/10 1500) Pulse Rate: 77 (01/10 1500)  Labs:  Recent Labs  04/14/16 1207 04/14/16 2243 04/15/16 0818  HGB 15.3  --  12.9*  HCT 43.5  --  37.5*  PLT 277  --  279  CREATININE 1.32*  --  1.08  CKTOTAL  --  25*  --     Estimated Creatinine Clearance: 61 mL/min (by C-G formula based on SCr of 1.08 mg/dL).   Medical History: Past Medical History:  Diagnosis Date  . Dyslipidemia   . GERD (gastroesophageal reflux disease)   . Glaucoma   . Hyperlipidemia   . Hypertension   . Insomnia   . Myasthenia gravis ([place]) 09/08/2012  . Obesity   . Ocular myasthenia gravis ([place])   . Prostate cancer [facility])    Assessment:   Chest CT showed bilateral PE with RV strain.  To begin IV heparin.   Received Lovenox 40 mg sq today at ~10am.  Goal of Therapy:  Heparin level 0.3-0.7 units/ml Monitor platelets by anticoagulation protocol: Yes   Plan:   Heparin 4000 units IV x 1.  Heparin drip to begin at 1400 units/hr.    Heparin level ~6 hours after drip begins.  Daily heparin level and CBC while on heparin.  Arty Baumgartner, [place] Pager: [phone] 04/15/2016,4:52 PM   Addendum:    Add Unasyn for pneumonia coverage.  Received Vancomycin, Cefepime and Azithromycin x 1 overnight.  Will begin Unasyn 3 gm IV q6hrs.  Follow culture data, culture data, progress.  Consuello Masse, RPh 04/15/2016 6:15 PM

## 2016-04-15 NOTE — Progress Notes (Addendum)
PROGRESS NOTE    Elijah Botti Sr.  [id number] DOB: 09/24/1934 DOA: 04/14/2016  PCP: No PCP Per Patient   Brief Narrative:  Elijah Ponder Sr. is a 82 y.o. male with medical history significant of hypertension, hyperlipidemia, GERD, prostate cancer (s/p radiation seed), right eye myasthenia gravis, who presents with generalized weakness, decreased oral intake, nausea.  Patient states that he was recently hospitalized from 12/28-12/29 2 acute gastroenteritis with vomiting and diarrhea. Patient states that he continued to have nausea, poor oral intake and generalized weakness since being discharged. He had 10 pounds of body weight loss since 12/29. He does not have vomiting, diarrhea or abdominal pain. Patient denies chest pain, cough or SOBt, but was found to have oxygen desaturation to 88% in ED.  Subjective: Phenergan helped with nausea and he was able to eat a little yesterday and today. Denies respiratory symptoms.   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 Gastritis vs PUD due to chronic Prednisone?- increase his daily PPI to BID - on Sucralfate QHS only- cont this  dose for now - Phenergan seems to be helping more than Zofran he was prescribed for home use for nausea    Myasthenia gravis - cont home Prednisone    ARF (acute renal failure) and orthostatic hypotension - due to poor oral intake - cont IVF- encourage oral fluids - Cr improved    Essential hypertension - hold Lisinopril due to AKI   DVT prophylaxis: Lovenox Code Status: Full code Family Communication: wife Disposition Plan: home when stable Consultants:    Procedures:    Antimicrobials:  Anti-infectives    Start     Dose/Rate Route Frequency Ordered Stop   04/15/16 2100  ceFEPIme (MAXIPIME) 2 g in dextrose 5 % 50 mL IVPB  Status:  Discontinued     2 g 100 mL/hr over 30 Minutes Intravenous Every 24 hours 04/14/16 2056 04/15/16 0442   04/15/16 1000  vancomycin (VANCOCIN) IVPB 750 mg/150 ml premix  Status:  Discontinued     750 mg 150 mL/hr over 60 Minutes Intravenous Every 12 hours 04/14/16 2056 04/15/16 0442   04/15/16 1000  azithromycin (ZITHROMAX) tablet 250 mg  Status:  Discontinued     250 mg Oral Daily 04/14/16 2155 04/15/16 0442   04/14/16 2200  azithromycin (ZITHROMAX) tablet 500 mg     500 mg Oral Daily 04/14/16 2155 04/15/16 0133   04/14/16 2045  ceFEPIme (MAXIPIME) 2 g in dextrose 5 % 50 mL IVPB     2 g 100 mL/hr over 30 Minutes Intravenous  Once 04/14/16 2030 04/15/16 0025   04/14/16 2045  vancomycin (VANCOCIN) IVPB 1000 mg/200 mL premix  Status:  Discontinued     1,000 mg 200 mL/hr over 60 Minutes Intravenous  Once 04/14/16 2030 04/14/16 2039   04/14/16 2045  vancomycin (VANCOCIN) 1,500 mg in sodium chloride 0.9 % 500 mL IVPB     1,500 mg 250 mL/hr over 120 Minutes Intravenous  Once 04/14/16 2039 04/15/16 0226       Objective: Vitals:   04/14/16 2230 04/15/16 0013 04/15/16 0055 04/15/16 0424  BP:  107/63 (!) 128/58 127/65  Pulse: 80 73 67 64  Resp: 23 23 20 20   Temp:   97.9 F (36.6 C) 97.9 F (36.6 C)  TempSrc:   Oral Oral  SpO2: 92% 95% 97%     Weight:    88.2 kg (194 lb 6.4 oz)  Height:    5\' 9"  (1.753 m)    Intake/Output Summary (Last 24 hours) at 04/15/16 1420 Last data filed at 04/15/16 0547  Gross per 24 hour  Intake           371.67 ml  Output              300 ml  Net            71.67 ml   Filed Weights   04/15/16 0424  Weight: 88.2 kg (194 lb 6.4 oz)    Examination: General exam: Appears comfortable  HEENT: PERRLA, oral mucosa moist, no sclera icterus or thrush Respiratory system: Clear to auscultation. Respiratory effort normal. Cardiovascular system: S1 & S2 heard, RRR.  No murmurs  Gastrointestinal system: Abdomen soft, non-tender, nondistended. Normal bowel sound. No organomegaly Central nervous system: Alert and oriented. No focal neurological deficits. Extremities: No cyanosis, clubbing or edema Skin: No rashes or ulcers Psychiatry:  Mood & affect appropriate.     Data Reviewed: I have personally reviewed following labs and imaging studies  CBC:  Recent Labs Lab 04/14/16 1207 04/15/16 0818  WBC 10.3 9.5  HGB 15.3 12.9*  HCT 43.5 37.5*  MCV 90.4 91.0  PLT 277 [id number]   Basic Metabolic Panel:  Recent Labs Lab 04/14/16 1207 04/15/16 0818  NA 136 139  K 3.4* 3.8  CL 100* 110  CO2 25 23  GLUCOSE 114* 106*  BUN 20 19  CREATININE 1.32* 1.08  CALCIUM 8.8* 8.0*   GFR: Estimated Creatinine Clearance: 61 mL/min (by C-G formula based on SCr of 1.08 mg/dL). Liver Function Tests:  Recent Labs Lab 04/14/16 1633  AST 22  ALT 15*  ALKPHOS 73  BILITOT 0.2*  PROT 6.5  ALBUMIN 3.1*    Recent Labs Lab 04/14/16 1633  LIPASE 29   No results for input(s): AMMONIA in the last 168 hours. Coagulation Profile: No results for input(s): INR, PROTIME in the last 168 hours. Cardiac Enzymes:  Recent Labs Lab 04/14/16 2243  CKTOTAL 25*   BNP (last 3 results) No results for input(s): PROBNP in the last 8760 hours. HbA1C: No results for input(s): HGBA1C in the last 72 hours. CBG:  Recent  Labs Lab 04/14/16 1224  GLUCAP 117*   Lipid Profile: No results for input(s): CHOL, HDL, LDLCALC, TRIG, CHOLHDL, LDLDIRECT in the last 72 hours. Thyroid Function Tests: No results for input(s): TSH, T4TOTAL, FREET4, T3FREE, THYROIDAB in the last 72 hours. Anemia Panel: No results for input(s): VITAMINB12, FOLATE, FERRITIN, TIBC, IRON, RETICCTPCT in the last 72 hours. Urine analysis:    Component Value Date/Time   COLORURINE YELLOW 04/14/2016 1900   APPEARANCEUR CLEAR 04/14/2016 1900   APPEARANCEUR Clear 01/07/2014 1435   LABSPEC 1.028 04/14/2016  1900   LABSPEC 1.020 01/07/2014 1435   PHURINE 5.0 04/14/2016 1900   GLUCOSEU NEGATIVE 04/14/2016 1900   GLUCOSEU Negative 01/07/2014 1435   HGBUR NEGATIVE 04/14/2016 1900   BILIRUBINUR NEGATIVE 04/14/2016 1900   BILIRUBINUR Negative 01/07/2014 [place] 04/14/2016 1900   PROTEINUR NEGATIVE 04/14/2016 1900   NITRITE NEGATIVE 04/14/2016 1900   LEUKOCYTESUR NEGATIVE 04/14/2016 1900   LEUKOCYTESUR Negative 01/07/2014 1435   Sepsis Labs: @LABRCNTIP (procalcitonin:4,lacticidven:4) ) Recent Results (from the past 240 hour(s))  Culture, blood (routine x 2) Call MD if unable to obtain prior to antibiotics being given     Status: None (Preliminary result)   Collection Time: 04/14/16 10:01 PM  Result Value Ref Range Status   Specimen Description BLOOD RIGHT HAND  Final   Special Requests IN PEDIATRIC BOTTLE 3CC  Final   Culture NO GROWTH < 12 HOURS  Final   Report Status PENDING  Incomplete  Culture, blood (routine x 2) Call MD if unable to obtain prior to antibiotics being given     Status: None (Preliminary result)   Collection Time: 04/14/16 10:39 PM  Result Value Ref Range Status   Specimen Description BLOOD LEFT ARM  Final   Special Requests BOTTLES DRAWN AEROBIC AND ANAEROBIC 5ML  Final   Culture NO GROWTH < 12 HOURS  Final   Report Status PENDING  Incomplete         Radiology Studies: Dg Chest 2  View  Result Date: 04/14/2016 CLINICAL DATA:  82 y/o  M; concern for pneumonia. EXAM: CHEST  2 VIEW COMPARISON:  05/08/2004 chest CT FINDINGS: Stable cardiac silhouette. Consolidation in the left upper lobe lingula. No pleural effusion. Mild degenerative changes of thoracic spine. Cholecystectomy clips. IMPRESSION: Consolidation in the left upper lobe lingula probably represents pneumonia. Follow-up to resolution is recommended with radiographs to exclude an underlying lesion. These results were called by telephone at the time of interpretation on 04/14/2016 at 8:17 pm to Dr. Nanda Quinton , who verbally acknowledged these results. Electronically Signed   By: Kristine Garbe M.D.   On: 04/14/2016 20:18   Ct Abdomen Pelvis W Contrast  Addendum Date: 04/14/2016   ADDENDUM REPORT: 04/14/2016 18:35 ADDENDUM: Study discussed by telephone with Dr. Vonna Kotyk LONG on 04/14/2016 at 1805 hours. Electronically Signed   By: Genevie Ann M.D.   On: 04/14/2016 18:35   Result Date: 04/14/2016 CLINICAL DATA:  82 year old male with unintentional weight loss since December. Epigastric abdominal pain. Increased weakness since hospitalization at [facility] in late December. Initial encounter. Personal history of prostate cancer. EXAM: CT ABDOMEN AND PELVIS WITH CONTRAST TECHNIQUE: Multidetector CT imaging of the abdomen and pelvis was performed using the standard protocol following bolus administration of intravenous contrast. CONTRAST:  153mL ISOVUE-300 IOPAMIDOL (ISOVUE-300) INJECTION 61% I was called by CT to examine this patient for contrast extravasation to the left upper extremity at 1715 hours. There is intravenous contrast present on the study. 40 mL to at most 60 mL of IV contrast extravasated into the proximal left ventral arm. The patient was in no distress. There was ecchymosis throughout the extravasation area which was swollen, indicating a degree of superimposed hematoma. I reviewed the natural  history of contrast extravasation and advised the patient to keep the extremity elevated, use ice, and over the counter are anti-inflammatory medicine. I recommended he return to the ED for any increasing left upper extremity pain or skin changes. COMPARISON:  CT Abdomen and Pelvis 01/07/2014. FINDINGS: Lower chest:  Partially visible 3-4 cm mass like area in the left lingula (series 4, image 1). Surrounding reticulonodular density in both the lingula and the left lower lobe (series 4, image 4). No associated left pleural effusion. Right lung base is stable since 2015 and negative. No right pleural effusion. No pericardial effusion. No upper abdominal free air. Intermittent mild motion artifact in the abdomen. Hepatobiliary: Surgically absent gallbladder as before. Stable intra and extrahepatic biliary tree. Negative liver. Pancreas: Negative. Spleen: Negative. No abdominal free fluid. Adrenals/Urinary Tract: Normal adrenal glands. Bilateral renal enhancement and contrast excretion is normal. No hydronephrosis. No urologic calculus identified. Unremarkable urinary bladder. Stomach/Bowel: Negative rectum. Retained stool mixed with contrast in the distal colon. Redundant sigmoid colon with diverticulosis, but no active inflammation identified. Negative left colon. Dense retained oral contrast at the splenic flexure and throughout the transverse colon which otherwise is negative. Similar dense retained contrast in the right colon. Negative right colon and appendix. Negative terminal ileum. No dilated small bowel. Decompressed stomach. Negative duodenum. Vascular/Lymphatic: Aortoiliac calcified atherosclerosis noted. Major arterial structures in the abdomen and pelvis are patent. Portal venous system is patent. No lymphadenopathy. Reproductive: Prostate brachytherapy changes. Small fat containing left inguinal hernia is stable. Other: No pelvic free fluid. Musculoskeletal: Degenerative changes in the spine. No acute or  suspicious osseous lesion identified. IMPRESSION: 1. IV contrast extravasation occurred along with some hematoma into the patient's left upper extremity as detailed in the Contrast section above. Nonetheless adequate intravenous contrast on the study. 2. Partially visible 3-4 cm mass like opacity in the peripheral lingula with surrounding lingula and left lower lobe tree-in-bud type nodular pulmonary opacity. No associated pleural effusion. Recommend follow-up PA and lateral chest radiographs, but at this point favor a multilobar pneumonia over left lung bronchogenic carcinoma. Query fever. If infection is suspected clinically then Followup PA and lateral chest X-ray is recommended in 3-4 weeks following trial of antibiotic therapy to ensure resolution and exclude underlying malignancy. 3. No acute or inflammatory process identified in the abdomen or pelvis. Normal appendix. Diverticulosis of the sigmoid colon without active inflammation. 4.  Calcified aortic atherosclerosis. Electronically Signed: By: Genevie Ann M.D. On: 04/14/2016 17:55      Scheduled Meds: . aspirin EC  81 mg Oral Daily  . atorvastatin  20 mg Oral QHS  . cholecalciferol  1,000 Units Oral Daily  . dextromethorphan-guaiFENesin  1 tablet Oral BID  . enoxaparin (LOVENOX) injection  40 mg Subcutaneous Q24H  . feeding supplement (ENSURE ENLIVE)  237 mL Oral BID BM  . latanoprost  1 drop Both Eyes QHS  . multivitamin with minerals  1 tablet Oral q morning - 10a  . pantoprazole  40 mg Oral Daily  . predniSONE  5 mg Oral Daily  . sucralfate  1 g Oral QHS   Continuous Infusions: . sodium chloride 100 mL/hr at 04/15/16 0044     LOS: 0 days    Time spent in minutes: 72    [place], MD [facility] Pager: [email or web address] Password [email or web address] 04/15/2016, 2:20 PM

## 2016-04-16 ENCOUNTER — Observation Stay (HOSPITAL_COMMUNITY): Payer: Medicare Other

## 2016-04-16 DIAGNOSIS — J189 Pneumonia, unspecified organism: Secondary | ICD-10-CM

## 2016-04-16 DIAGNOSIS — J9601 Acute respiratory failure with hypoxia: Principal | ICD-10-CM

## 2016-04-16 DIAGNOSIS — K219 Gastro-esophageal reflux disease without esophagitis: Secondary | ICD-10-CM

## 2016-04-16 DIAGNOSIS — I2699 Other pulmonary embolism without acute cor pulmonale: Secondary | ICD-10-CM

## 2016-04-16 LAB — ECHOCARDIOGRAM COMPLETE
Height: 69 in
WEIGHTICAEL: 3082.91 [oz_av]

## 2016-04-16 LAB — CBC
HCT: 33.6 % — ABNORMAL LOW (ref 39.0–52.0)
Hemoglobin: 11.8 g/dL — ABNORMAL LOW (ref 13.0–17.0)
MCH: 31.4 pg (ref 26.0–34.0)
MCHC: 35.1 g/dL (ref 30.0–36.0)
MCV: 89.4 fL (ref 78.0–100.0)
PLATELETS: 277 10*3/uL (ref 150–400)
RBC: 3.76 MIL/uL — ABNORMAL LOW (ref 4.22–5.81)
RDW: 12.7 % (ref 11.5–15.5)
WBC: 8 10*3/uL (ref 4.0–10.5)

## 2016-04-16 LAB — HEPARIN LEVEL (UNFRACTIONATED)
Heparin Unfractionated: 1.6 IU/mL — ABNORMAL HIGH (ref 0.30–0.70)
Heparin Unfractionated: 1.01 IU/mL — ABNORMAL HIGH (ref 0.30–0.70)

## 2016-04-16 LAB — MRSA PCR SCREENING: MRSA BY PCR: NEGATIVE

## 2016-04-16 MED ORDER — HEPARIN (PORCINE) IN NACL 100-0.45 UNIT/ML-% IJ SOLN: 1150.0000 [IU]/h | INTRAMUSCULAR | Status: DC

## 2016-04-16 MED ORDER — LISINOPRIL 20 MG PO TABS
20.0000 mg | ORAL_TABLET | Freq: Every morning | ORAL | Status: DC
  Administered 2016-04-16 – 2016-04-17 (×2): 20 mg via ORAL
  Filled 2016-04-16 (×2): qty 1

## 2016-04-16 MED ORDER — HEPARIN (PORCINE) IN NACL 100-0.45 UNIT/ML-% IJ SOLN
950.0000 [IU]/h | INTRAMUSCULAR | Status: DC
  Administered 2016-04-16 – 2016-04-17 (×2): 950 [IU]/h via INTRAVENOUS
  Filled 2016-04-16: qty 250

## 2016-04-16 NOTE — Progress Notes (Signed)
Name: Elijah Kotarski Sr. MRN: [id number] DOB: 1934-08-19    ADMISSION DATE:  04/14/2016 CONSULTATION DATE:  04/15/16  REFERRING MD :  Wynelle Cleveland  CHIEF COMPLAINT:  Cavitary lung mass  SUBJECTIVE:  Denies any SOB, chest pain.  "feels fine" VITAL SIGNS: Temp:  [97.9 F (36.6 C)-98.4 F (36.9 C)] 98.4 F (36.9 C) (01/11 0401) Pulse Rate:  [64-77] 67 (01/11 0401) Resp:  [14-25] 22 (01/11 0401) BP: (141-154)/(67-76) 144/73 (01/11 0401) SpO2:  [92 %-97 %] 92 % (01/11 0401) Weight:  [192 lb 10.9 oz (87.4 kg)] 192 lb 10.9 oz (87.4 kg) (01/10 2157)  PHYSICAL EXAMINATION: General: Adult male, resting in bed, in NAD.  Neuro: A&O x 3, non-focal.  HEENT: [place]/AT. PERRL, sclerae anicteric. Cardiovascular: RRR, no M/R/G.  Lungs: Respirations even and unlabored.  CTA bilaterally, No W/R/R. Abdomen: BS x 4, soft, NT/ND.  Musculoskeletal: No gross deformities, no edema.  Skin: Intact, warm, no rashes.     Recent Labs Lab 04/14/16 1207 04/15/16 0818  NA 136 139  K 3.4* 3.8  CL 100* 110  CO2 25 23  BUN 20 19  CREATININE 1.32* 1.08  GLUCOSE 114* 106*    Recent Labs Lab 04/14/16 1207 04/15/16 0818 04/16/16 0111  HGB 15.3 12.9* 11.8*  HCT 43.5 37.5* 33.6*  WBC 10.3 9.5 8.0  PLT 277 279 277   Dg Chest 2 View  Result Date: 04/14/2016 CLINICAL DATA:  82 y/o  M; concern for pneumonia. EXAM: CHEST  2 VIEW COMPARISON:  05/08/2004 chest CT FINDINGS: Stable cardiac silhouette. Consolidation in the left upper lobe lingula. No pleural effusion. Mild degenerative changes of thoracic spine. Cholecystectomy clips. IMPRESSION: Consolidation in the left upper lobe lingula probably represents pneumonia. Follow-up to resolution is recommended with radiographs to exclude an underlying lesion. These results were called by telephone at the time of interpretation on 04/14/2016 at 8:17 pm to Dr. Nanda Quinton , who verbally acknowledged these results. Electronically Signed   By: Kristine Garbe M.D.   On:  04/14/2016 20:18   Ct Chest W Contrast  Result Date: 04/15/2016 CLINICAL DATA:  Pneumonia versus mass, abnormal chest radiograph EXAM: CT CHEST WITH CONTRAST TECHNIQUE: Multidetector CT imaging of the chest was performed during intravenous contrast administration. Sagittal and coronal MPR images reconstructed from axial data set. CONTRAST:  ISOVUE-300 IOPAMIDOL (ISOVUE-300) INJECTION 61% IV COMPARISON:  05/08/2004; correlation chest radiograph 04/14/2016 FINDINGS: Cardiovascular: Atherosclerotic calcifications aorta and coronary arteries. Aorta normal caliber. No pericardial effusion. Filling defects identified in LEFT lower lobe pulmonary arteries and at the bifurcation of the RIGHT pulmonary artery extending into RIGHT middle and RIGHT lower lobes compatible with large BILATERAL pulmonary emboli. RV:LV ratio = 1.11 Mediastinum/Nodes: Esophagus unremarkable. Base of cervical region normal appearance. Few scattered normal sized mediastinal lymph nodes without thoracic adenopathy. Lungs/Pleura: Masslike area of opacity with cavitation identified in the lingula, 4.2 x 2.3 x 3.2 cm, question neoplasm versus cavitary pneumonia. Scattered mild reticulonodular infiltrates in lingula and LEFT lower lobe. Focus of infiltrate laterally in the posterior RIGHT upper lobe adjacent to major fissure. Central peribronchial thickening. Minimal atelectasis or scarring in RIGHT middle lobe. No additional pulmonary mass identified. No significant pleural fluid collection. Upper Abdomen: Gallbladder surgically absent. Visualized upper abdomen otherwise normal. Musculoskeletal: Demineralized.  No focal osseous abnormalities. IMPRESSION: BILATERAL pulmonary emboli, including in LEFT lower lobe and a saddle embolus at the RIGHT pulmonary artery bifurcation. Positive for acute PE with CT evidence of right heart strain (RV/LV Ratio = ) consistent with at least  submassive (intermediate risk) PE. The presence of right heart strain has  been associated with an increased risk of morbidity and mortality. Please activate Code PE by paging [phone]. Cavitary mass versus pneumonia in the LEFT upper lobe/ lingula, 4.2 x 2.3 x 3.2 cm; followup assessment until resolution recommended to exclude neoplasm. Reticulonodular infiltrates in lingula and LEFT lower lobe. Aortic atherosclerosis and coronary arterial calcification. Critical Value/emergent results were called by telephone at the time of interpretation on 04/15/2016 at 1508 hr to Dr. Debbe Odea , who verbally acknowledged these results. Electronically Signed   By: Lavonia Dana M.D.   On: 04/15/2016 15:12   Ct Abdomen Pelvis W Contrast  Addendum Date: 04/14/2016   ADDENDUM REPORT: 04/14/2016 18:35 ADDENDUM: Study discussed by telephone with Dr. Vonna Kotyk LONG on 04/14/2016 at 1805 hours. Electronically Signed   By: Genevie Ann M.D.   On: 04/14/2016 18:35   Result Date: 04/14/2016 CLINICAL DATA:  82 year old male with unintentional weight loss since December. Epigastric abdominal pain. Increased weakness since hospitalization at [facility] in late December. Initial encounter. Personal history of prostate cancer. EXAM: CT ABDOMEN AND PELVIS WITH CONTRAST TECHNIQUE: Multidetector CT imaging of the abdomen and pelvis was performed using the standard protocol following bolus administration of intravenous contrast. CONTRAST:  152mL ISOVUE-300 IOPAMIDOL (ISOVUE-300) INJECTION 61% I was called by CT to examine this patient for contrast extravasation to the left upper extremity at 1715 hours. There is intravenous contrast present on the study. 40 mL to at most 60 mL of IV contrast extravasated into the proximal left ventral arm. The patient was in no distress. There was ecchymosis throughout the extravasation area which was swollen, indicating a degree of superimposed hematoma. I reviewed the natural history of contrast extravasation and advised the patient to keep the extremity elevated,  use ice, and over the counter are anti-inflammatory medicine. I recommended he return to the ED for any increasing left upper extremity pain or skin changes. COMPARISON:  CT Abdomen and Pelvis 01/07/2014. FINDINGS: Lower chest: Partially visible 3-4 cm mass like area in the left lingula (series 4, image 1). Surrounding reticulonodular density in both the lingula and the left lower lobe (series 4, image 4). No associated left pleural effusion. Right lung base is stable since 2015 and negative. No right pleural effusion. No pericardial effusion. No upper abdominal free air. Intermittent mild motion artifact in the abdomen. Hepatobiliary: Surgically absent gallbladder as before. Stable intra and extrahepatic biliary tree. Negative liver. Pancreas: Negative. Spleen: Negative. No abdominal free fluid. Adrenals/Urinary Tract: Normal adrenal glands. Bilateral renal enhancement and contrast excretion is normal. No hydronephrosis. No urologic calculus identified. Unremarkable urinary bladder. Stomach/Bowel: Negative rectum. Retained stool mixed with contrast in the distal colon. Redundant sigmoid colon with diverticulosis, but no active inflammation identified. Negative left colon. Dense retained oral contrast at the splenic flexure and throughout the transverse colon which otherwise is negative. Similar dense retained contrast in the right colon. Negative right colon and appendix. Negative terminal ileum. No dilated small bowel. Decompressed stomach. Negative duodenum. Vascular/Lymphatic: Aortoiliac calcified atherosclerosis noted. Major arterial structures in the abdomen and pelvis are patent. Portal venous system is patent. No lymphadenopathy. Reproductive: Prostate brachytherapy changes. Small fat containing left inguinal hernia is stable. Other: No pelvic free fluid. Musculoskeletal: Degenerative changes in the spine. No acute or suspicious osseous lesion identified. IMPRESSION: 1. IV contrast extravasation occurred  along with some hematoma into the patient's left upper extremity as detailed in the Contrast section above. Nonetheless adequate  intravenous contrast on the study. 2. Partially visible 3-4 cm mass like opacity in the peripheral lingula with surrounding lingula and left lower lobe tree-in-bud type nodular pulmonary opacity. No associated pleural effusion. Recommend follow-up PA and lateral chest radiographs, but at this point favor a multilobar pneumonia over left lung bronchogenic carcinoma. Query fever. If infection is suspected clinically then Followup PA and lateral chest X-ray is recommended in 3-4 weeks following trial of antibiotic therapy to ensure resolution and exclude underlying malignancy. 3. No acute or inflammatory process identified in the abdomen or pelvis. Normal appendix. Diverticulosis of the sigmoid colon without active inflammation. 4.  Calcified aortic atherosclerosis. Electronically Signed: By: Genevie Ann M.D. On: 04/14/2016 17:55    STUDIES:  CT A/P 01/09 > no acute process. Partially visible 3-4 cm masslike opacity in the periphery of lingula. CT chest 01/10 > bilateral PE, R >L. Cavitary mass versus pneumonia in the LUL/lingula, 4.2 x 2.3 x 3.2cm. ECHO /11: EF nml (65-70%); LA mod dilated. NML RV size. NML PAP LE [place] 1/11>>>  SIGNIFICANT EVENTS  01/09 > admit. 01/10 > PCCM consult.  ASSESSMENT / PLAN:  Acute bilateral PE Cavitary lung mass  Etiology of lung mass is unclear but concern for malignancy. Has history of prostate cancer status post TURP. Now also has cavitary mass in LUL/lingula that although is not definitive for malignancy, certainly is highly suspicious. DDX includes infectious process (cannot rule out at this point).  Plan: Would transition to [place] f/u LE duplex. May need IVC filter if mobile clot present. Can transition Unasyn to Augmentin at dc (treat for 2 weeks) F/u sputum if able to get specimen  Pulmonary hygiene. Will get him f/u with [place] in next  couple weeks w/ then plan for f/u imaging via CT scan. If not clear then we could set up biopsy  for Continue supplemental O2 as needed to maintain SpO2 > 92%.   Rest of care per primary team. We will s/o call PRN  Erick Colace ACNP-BC [place] Pager # [phone] OR # [phone] if no answer

## 2016-04-16 NOTE — Evaluation (Signed)
Occupational Therapy Evaluation Patient Details Name: Anna Guldner Sr. MRN: [id number] DOB: [birth year] Today's Date: 04/16/2016    History of Present Illness Pt is a 82 y.o. male with medical history significant of hypertension, hyperlipidemia, GERD, prostate cancer (s/p radiation seed), right eye myasthenia gravis, who presents with generalized weakness, decreased oral intake, nausea. Currently being treated for acute respiratory failure with hypoxia   Clinical Impression   Pt is performing ADL and mobility at a supervision to modified independent level. Educated in energy conservation and home safety. No further OT needs.    Follow Up Recommendations  No OT follow up    Equipment Recommendations  None recommended by OT    Recommendations for Other Services       Precautions / Restrictions Precautions Precautions: Fall Restrictions Weight Bearing Restrictions: No      Mobility Bed Mobility Overal bed mobility: Modified Independent                Transfers Overall transfer level: Modified independent Equipment used: None   Sit to Stand: Independent              Balance     Sitting balance-Leahy Scale: Good       Standing balance-Leahy Scale: Good                              ADL                                         General ADL Comments: Supervision for safety and IV management. Educated pt and family in energy conservation strategies and recommended considering tub seat and grab bars.     Vision     Perception     Praxis      Pertinent Vitals/Pain Pain Assessment: No/denies pain     Hand Dominance Right   Extremity/Trunk Assessment Upper Extremity Assessment Upper Extremity Assessment: Overall WFL for tasks assessed   Lower Extremity Assessment Lower Extremity Assessment: Overall WFL for tasks assessed       Communication Communication Communication: No difficulties   Cognition  Arousal/Alertness: Awake/alert Behavior During Therapy: WFL for tasks assessed/performed Overall Cognitive Status: Within Functional Limits for tasks assessed                     General Comments       Exercises       Shoulder Instructions      Home Living Family/patient expects to be discharged to:: Private residence Living Arrangements: Spouse/significant other Available Help at Discharge: Family;Available PRN/intermittently (wife works days) Type of Home: House Home Access: Stairs to enter Technical brewer of Steps: 2 Entrance Stairs-Rails: Right Home Layout: One level     Bathroom Shower/Tub: Tub/shower unit Shower/tub characteristics: Door Biochemist, clinical: Standard     Home Equipment: Environmental consultant - 2 wheels;Cane - single point;Crutches          Prior Functioning/Environment Level of Independence: Independent                 OT Problem List: Decreased activity tolerance   OT Treatment/Interventions:      OT Goals(Current goals can be found in the care plan section) Acute Rehab OT Goals Patient Stated Goal: to get well  OT Frequency:     Barriers to D/C:  Co-evaluation              End of Session    Activity Tolerance: Patient tolerated treatment well Patient left: in bed;with call bell/phone within reach;with family/visitor present   Time: [id number] OT Time Calculation (min): 22 min Charges:  OT General Charges $OT Visit: 1 Procedure OT Evaluation $OT Eval Low Complexity: 1 Procedure G-Codes:    Malka So 04/16/2016, 12:51 PM [phone]

## 2016-04-16 NOTE — Progress Notes (Signed)
ANTICOAGULATION CONSULT NOTE  Pharmacy Consult for Heparin Indication: pulmonary embolus  No Known Allergies  Patient Measurements: Height: 5\' 9"  (175.3 cm) Weight: 192 lb 10.9 oz (87.4 kg) IBW/kg (Calculated) : 70.7   Vital Signs: Temp: 98 F (36.7 C) (01/11 1100) Temp Source: Oral (01/11 1100) BP: 149/68 (01/11 1100) Pulse Rate: 69 (01/11 1100)  Labs:  Recent Labs  04/14/16 1207 04/14/16 2243 04/15/16 0818 04/16/16 0111 04/16/16 1225  HGB 15.3  --  12.9* 11.8*  --   HCT 43.5  --  37.5* 33.6*  --   PLT 277  --  279 277  --   HEPARINUNFRC  --   --   --  1.60* 1.01*  CREATININE 1.32*  --  1.08  --   --   CKTOTAL  --  25*  --   --   --     Estimated Creatinine Clearance: 60.7 mL/min (by C-G formula based on SCr of 1.08 mg/dL).   Medical History: Past Medical History:  Diagnosis Date  . Dyslipidemia   . GERD (gastroesophageal reflux disease)   . Glaucoma   . Hyperlipidemia   . Hypertension   . Insomnia   . Myasthenia gravis ([place]) 09/08/2012  . Obesity   . Ocular myasthenia gravis ([place])   . Prostate cancer [facility])    Assessment: 82 yo male with bilateral PE with RV strain on heparin. Heparin level remains elevated today despite most recent dose decrease.  No issues with infusion or sxs of bleeding per discussion with RN. CBC from this morning remains stable.   Goal of Therapy:  Heparin level 0.3-0.7 units/ml Monitor platelets by anticoagulation protocol: Yes   Plan:  1. Hold for 1 hour and decrease heparin infusion to 950 units/hr 2. Repeat heparin level in 8 hours  3. Noted plans to potentially transition to Lovenox for further treatment    Vincenza Hews, PharmD, BCPS 04/16/2016, 3:05 PM Pager: [phone]

## 2016-04-16 NOTE — Progress Notes (Signed)
*  PRELIMINARY RESULTS* Vascular Ultrasound Bilateral lower extremity venous duplex has been completed.  Preliminary findings: Right lower extremity is positive for deep vein thrombosis in the proximal gastrocnemius vein.  Left lower extremity is positive for acute and age indeterminate deep vein thrombosis in the femoral and popliteal veins.  Preliminary results given to nurse at time of exam.  Everrett Coombe 04/16/2016, 5:20 PM

## 2016-04-16 NOTE — Progress Notes (Signed)
PROGRESS NOTE    Elijah Sarkisyan Sr.  [id number] DOB: April 21, 1934 DOA: 04/14/2016  PCP: No PCP Per Patient   Brief Narrative:  Elijah Ponder Sr. is a 82 y.o. male with medical history significant of hypertension, hyperlipidemia, GERD, prostate cancer (s/p radiation seed), right eye myasthenia gravis, who presents with generalized weakness, decreased oral intake, nausea.  Patient states that he was recently hospitalized from 12/28-12/29 2 acute gastroenteritis with vomiting and diarrhea. Patient states that he continued to have nausea, poor oral intake and generalized weakness since being discharged. He had 10 pounds of body weight loss since 12/29. He does not have vomiting, diarrhea or abdominal pain. Patient denies chest pain, cough or SOBt, but was found to have oxygen desaturation to 88% in ED.  Subjective: Nausea has resolved. Cough has improved as well. He feels that he is able to breathe better today.   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 Gastritis vs PUD due to chronic Prednisone?- increase his daily PPI to BID - on Sucralfate QHS only- cont this dose for now - Nausea has improved and he has regained his appetite    Myasthenia gravis - cont home Prednisone    ARF (acute  renal failure) and orthostatic hypotension - due to poor oral intake - Can discontinue IV fluids today as hers oral intake has improved    Essential hypertension - Lisinopril held due to AKI-as creatinine has improved and BP is elevated, will resume lisinopril   DVT prophylaxis: Lovenox Code Status: Full code Family Communication: wife Disposition Plan: home when stable Consultants:    Procedures:   2-D echo Left ventricle: The cavity size was normal. Wall thickness was   increased in a pattern of moderate LVH. Systolic function was   vigorous. The estimated ejection fraction was in the range of 65%   to 70%. Wall motion was normal; there were no regional wall   motion abnormalities. - Left atrium: The atrium was mildly to moderately dilated.  Antimicrobials:  Anti-infectives    Start     Dose/Rate Route Frequency Ordered Stop   04/15/16 2100  ceFEPIme (MAXIPIME) 2 g in dextrose 5 % 50 mL IVPB  Status:  Discontinued     2 g 100 mL/hr over 30 Minutes Intravenous Every 24 hours 04/14/16 2056 04/15/16 0442   04/15/16 1900  Ampicillin-Sulbactam (UNASYN) 3 g in sodium chloride 0.9 % 100 mL IVPB     3 g 200 mL/hr over 30 Minutes Intravenous Every 6 hours 04/15/16 1809     04/15/16 1000  vancomycin (VANCOCIN) IVPB 750 mg/150 ml premix  Status:  Discontinued     750 mg 150 mL/hr over 60 Minutes Intravenous Every 12 hours 04/14/16 2056 04/15/16 0442   04/15/16 1000  azithromycin (ZITHROMAX) tablet 250 mg  Status:  Discontinued     250 mg Oral Daily 04/14/16 2155 04/15/16 0442   04/14/16 2200  azithromycin (ZITHROMAX) tablet 500 mg     500 mg Oral Daily 04/14/16 2155 04/15/16 0133   04/14/16 2045  ceFEPIme (MAXIPIME) 2 g in dextrose 5 % 50 mL IVPB     2 g 100 mL/hr over 30 Minutes Intravenous  Once 04/14/16 2030 04/15/16 0025   04/14/16 2045  vancomycin (VANCOCIN) IVPB 1000 mg/200 mL premix  Status:  Discontinued     1,000 mg 200 mL/hr over 60 Minutes Intravenous  Once 04/14/16  2030 04/14/16 2039   04/14/16 2045  vancomycin (VANCOCIN) 1,500 mg in sodium chloride 0.9 % 500 mL IVPB     1,500 mg 250 mL/hr over 120 Minutes Intravenous  Once 04/14/16 2039 04/15/16 0226       Objective: Vitals:   04/16/16 0401 04/16/16 0700 04/16/16 1100 04/16/16 1500  BP: (!) 144/73 (!) 145/62 (!) 149/68 (!) 164/73  Pulse: 67 63 69 78  Resp: (!) 22 (!) 21 (!) 26 19  Temp: 98.4 F (36.9 C) 97.8 F (36.6 C) 98 F (36.7 C) 98.1 F (36.7 C)  TempSrc: Oral Oral Oral Oral  SpO2: 92% 92% 95% 95%  Weight:      Height:        Intake/Output Summary (Last 24 hours) at 04/16/16 1627 Last data filed at 04/16/16 1256  Gross per 24 hour  Intake          1131.25 ml  Output             1326 ml  Net          -194.75 ml   Filed Weights   04/15/16 0424 04/15/16 2157  Weight: 88.2 kg (194 lb 6.4 oz) 87.4 kg (192 lb 10.9 oz)    Examination: General exam: Appears comfortable  HEENT: PERRLA, oral mucosa moist, no sclera icterus or thrush Respiratory system: Clear to auscultation. Respiratory effort normal. Cardiovascular system: S1 & S2 heard, RRR.  No murmurs  Gastrointestinal system: Abdomen soft, non-tender, nondistended. Normal bowel sound. No organomegaly Central nervous system: Alert and oriented. No focal neurological deficits. Extremities: No cyanosis, clubbing or edema Skin: No rashes or ulcers Psychiatry:  Mood & affect appropriate.     Data Reviewed: I have personally reviewed following labs and imaging studies  CBC:  Recent Labs Lab 04/14/16 1207 04/15/16 0818 04/16/16 0111  WBC 10.3 9.5 8.0  HGB 15.3 12.9* 11.8*  HCT 43.5 37.5* 33.6*  MCV 90.4 91.0 89.4  PLT 277 279 [id number]   Basic Metabolic Panel:  Recent Labs Lab 04/14/16 1207 04/15/16 0818  NA 136 139  K 3.4* 3.8  CL 100* 110  CO2 25 23  GLUCOSE 114* 106*  BUN 20 19  CREATININE 1.32* 1.08  CALCIUM 8.8* 8.0*   GFR: Estimated Creatinine Clearance: 60.7 mL/min (by C-G formula based on SCr of 1.08  mg/dL). Liver Function Tests:  Recent Labs Lab 04/14/16 1633  AST 22  ALT 15*  ALKPHOS 73  BILITOT 0.2*  PROT 6.5  ALBUMIN 3.1*    Recent Labs Lab 04/14/16 1633  LIPASE 29   No results for input(s): AMMONIA in the last 168 hours. Coagulation Profile: No results for input(s): INR, PROTIME in the last 168 hours. Cardiac Enzymes:  Recent Labs Lab 04/14/16 2243  CKTOTAL 25*   BNP (last 3 results) No results for input(s): PROBNP in the last 8760 hours. HbA1C: No results for input(s):  HGBA1C in the last 72 hours. CBG:  Recent Labs Lab 04/14/16 1224  GLUCAP 117*   Lipid Profile: No results for input(s): CHOL, HDL, LDLCALC, TRIG, CHOLHDL, LDLDIRECT in the last 72 hours. Thyroid Function Tests: No results for input(s): TSH, T4TOTAL, FREET4, T3FREE, THYROIDAB in the last 72 hours. Anemia Panel: No results for input(s): VITAMINB12, FOLATE, FERRITIN, TIBC, IRON, RETICCTPCT in the last 72 hours. Urine analysis:    Component Value Date/Time   COLORURINE YELLOW 04/14/2016 1900   APPEARANCEUR CLEAR 04/14/2016 1900   APPEARANCEUR Clear 01/07/2014 1435   LABSPEC 1.028 04/14/2016 1900   LABSPEC 1.020 01/07/2014 1435   PHURINE 5.0 04/14/2016 1900   GLUCOSEU NEGATIVE 04/14/2016 1900   GLUCOSEU Negative 01/07/2014 1435   HGBUR NEGATIVE 04/14/2016 1900   BILIRUBINUR NEGATIVE 04/14/2016 1900   BILIRUBINUR Negative 01/07/2014 1435   KETONESUR NEGATIVE 04/14/2016 1900   PROTEINUR NEGATIVE 04/14/2016 1900   NITRITE NEGATIVE 04/14/2016 1900   LEUKOCYTESUR NEGATIVE 04/14/2016 1900   LEUKOCYTESUR Negative 01/07/2014 1435   Sepsis Labs: @LABRCNTIP (procalcitonin:4,lacticidven:4) ) Recent Results (from the past 240 hour(s))  Culture, blood (routine x 2) Call MD if unable to obtain prior to antibiotics being given     Status: None (Preliminary result)   Collection Time: 04/14/16 10:01 PM  Result Value Ref Range Status   Specimen Description BLOOD RIGHT HAND  Final   Special  Requests IN PEDIATRIC BOTTLE 3CC  Final   Culture NO GROWTH 2 DAYS  Final   Report Status PENDING  Incomplete  Culture, blood (routine x 2) Call MD if unable to obtain prior to antibiotics being given     Status: None (Preliminary result)   Collection Time: 04/14/16 10:39 PM  Result Value Ref Range Status   Specimen Description BLOOD LEFT ARM  Final   Special Requests BOTTLES DRAWN AEROBIC AND ANAEROBIC 5ML  Final   Culture NO GROWTH 2 DAYS  Final   Report Status PENDING  Incomplete  MRSA PCR Screening     Status: None   Collection Time: 04/15/16 11:28 PM  Result Value Ref Range Status   MRSA by PCR NEGATIVE NEGATIVE Final    Comment:        The GeneXpert MRSA Assay (FDA approved for NASAL specimens only), is one component of a comprehensive MRSA colonization surveillance program. It is not intended to diagnose MRSA infection nor to guide or monitor treatment for MRSA infections.          Radiology Studies: Dg Chest 2 View  Result Date: 04/14/2016 CLINICAL DATA:  82 y/o  M; concern for pneumonia. EXAM: CHEST  2 VIEW COMPARISON:  05/08/2004 chest CT FINDINGS: Stable cardiac silhouette. Consolidation in the left upper lobe lingula. No pleural effusion. Mild degenerative changes of thoracic spine. Cholecystectomy clips. IMPRESSION: Consolidation in the left upper lobe lingula probably represents pneumonia. Follow-up to resolution is recommended with radiographs to exclude an underlying lesion. These results were called by telephone at the time of interpretation on 04/14/2016 at 8:17 pm to Dr. Nanda Quinton , who verbally acknowledged these results. Electronically Signed   By: Kristine Garbe M.D.   On: 04/14/2016 20:18   Ct Chest W Contrast  Result Date: 04/15/2016 CLINICAL DATA:  Pneumonia versus mass, abnormal chest radiograph EXAM: CT CHEST WITH CONTRAST TECHNIQUE: Multidetector CT imaging of the chest was performed during intravenous contrast administration. Sagittal and  coronal MPR images reconstructed from axial data set. CONTRAST:  ISOVUE-300 IOPAMIDOL (ISOVUE-300) INJECTION 61% IV COMPARISON:  05/08/2004; correlation chest radiograph  04/14/2016 FINDINGS: Cardiovascular: Atherosclerotic calcifications aorta and coronary arteries. Aorta normal caliber. No pericardial effusion. Filling defects identified in LEFT lower lobe pulmonary arteries and at the bifurcation of the RIGHT pulmonary artery extending into RIGHT middle and RIGHT lower lobes compatible with large BILATERAL pulmonary emboli. RV:LV ratio = 1.11 Mediastinum/Nodes: Esophagus unremarkable. Base of cervical region normal appearance. Few scattered normal sized mediastinal lymph nodes without thoracic adenopathy. Lungs/Pleura: Masslike area of opacity with cavitation identified in the lingula, 4.2 x 2.3 x 3.2 cm, question neoplasm versus cavitary pneumonia. Scattered mild reticulonodular infiltrates in lingula and LEFT lower lobe. Focus of infiltrate laterally in the posterior RIGHT upper lobe adjacent to major fissure. Central peribronchial thickening. Minimal atelectasis or scarring in RIGHT middle lobe. No additional pulmonary mass identified. No significant pleural fluid collection. Upper Abdomen: Gallbladder surgically absent. Visualized upper abdomen otherwise normal. Musculoskeletal: Demineralized.  No focal osseous abnormalities. IMPRESSION: BILATERAL pulmonary emboli, including in LEFT lower lobe and a saddle embolus at the RIGHT pulmonary artery bifurcation. Positive for acute PE with CT evidence of right heart strain (RV/LV Ratio = ) consistent with at least submassive (intermediate risk) PE. The presence of right heart strain has been associated with an increased risk of morbidity and mortality. Please activate Code PE by paging [phone]. Cavitary mass versus pneumonia in the LEFT upper lobe/ lingula, 4.2 x 2.3 x 3.2 cm; followup assessment until resolution recommended to exclude neoplasm. Reticulonodular  infiltrates in lingula and LEFT lower lobe. Aortic atherosclerosis and coronary arterial calcification. Critical Value/emergent results were called by telephone at the time of interpretation on 04/15/2016 at 1508 hr to Dr. Debbe Odea , who verbally acknowledged these results. Electronically Signed   By: Lavonia Dana M.D.   On: 04/15/2016 15:12   Ct Abdomen Pelvis W Contrast  Addendum Date: 04/14/2016   ADDENDUM REPORT: 04/14/2016 18:35 ADDENDUM: Study discussed by telephone with Dr. Vonna Kotyk LONG on 04/14/2016 at 1805 hours. Electronically Signed   By: Genevie Ann M.D.   On: 04/14/2016 18:35   Result Date: 04/14/2016 CLINICAL DATA:  82 year old male with unintentional weight loss since December. Epigastric abdominal pain. Increased weakness since hospitalization at [facility] in late December. Initial encounter. Personal history of prostate cancer. EXAM: CT ABDOMEN AND PELVIS WITH CONTRAST TECHNIQUE: Multidetector CT imaging of the abdomen and pelvis was performed using the standard protocol following bolus administration of intravenous contrast. CONTRAST:  171mL ISOVUE-300 IOPAMIDOL (ISOVUE-300) INJECTION 61% I was called by CT to examine this patient for contrast extravasation to the left upper extremity at 1715 hours. There is intravenous contrast present on the study. 40 mL to at most 60 mL of IV contrast extravasated into the proximal left ventral arm. The patient was in no distress. There was ecchymosis throughout the extravasation area which was swollen, indicating a degree of superimposed hematoma. I reviewed the natural history of contrast extravasation and advised the patient to keep the extremity elevated, use ice, and over the counter are anti-inflammatory medicine. I recommended he return to the ED for any increasing left upper extremity pain or skin changes. COMPARISON:  CT Abdomen and Pelvis 01/07/2014. FINDINGS: Lower chest: Partially visible 3-4 cm mass like area in the left  lingula (series 4, image 1). Surrounding reticulonodular density in both the lingula and the left lower lobe (series 4, image 4). No associated left pleural effusion. Right lung base is stable since 2015 and negative. No right pleural effusion. No pericardial effusion. No upper abdominal free air. Intermittent mild motion  artifact in the abdomen. Hepatobiliary: Surgically absent gallbladder as before. Stable intra and extrahepatic biliary tree. Negative liver. Pancreas: Negative. Spleen: Negative. No abdominal free fluid. Adrenals/Urinary Tract: Normal adrenal glands. Bilateral renal enhancement and contrast excretion is normal. No hydronephrosis. No urologic calculus identified. Unremarkable urinary bladder. Stomach/Bowel: Negative rectum. Retained stool mixed with contrast in the distal colon. Redundant sigmoid colon with diverticulosis, but no active inflammation identified. Negative left colon. Dense retained oral contrast at the splenic flexure and throughout the transverse colon which otherwise is negative. Similar dense retained contrast in the right colon. Negative right colon and appendix. Negative terminal ileum. No dilated small bowel. Decompressed stomach. Negative duodenum. Vascular/Lymphatic: Aortoiliac calcified atherosclerosis noted. Major arterial structures in the abdomen and pelvis are patent. Portal venous system is patent. No lymphadenopathy. Reproductive: Prostate brachytherapy changes. Small fat containing left inguinal hernia is stable. Other: No pelvic free fluid. Musculoskeletal: Degenerative changes in the spine. No acute or suspicious osseous lesion identified. IMPRESSION: 1. IV contrast extravasation occurred along with some hematoma into the patient's left upper extremity as detailed in the Contrast section above. Nonetheless adequate intravenous contrast on the study. 2. Partially visible 3-4 cm mass like opacity in the peripheral lingula with surrounding lingula and left lower lobe  tree-in-bud type nodular pulmonary opacity. No associated pleural effusion. Recommend follow-up PA and lateral chest radiographs, but at this point favor a multilobar pneumonia over left lung bronchogenic carcinoma. Query fever. If infection is suspected clinically then Followup PA and lateral chest X-ray is recommended in 3-4 weeks following trial of antibiotic therapy to ensure resolution and exclude underlying malignancy. 3. No acute or inflammatory process identified in the abdomen or pelvis. Normal appendix. Diverticulosis of the sigmoid colon without active inflammation. 4.  Calcified aortic atherosclerosis. Electronically Signed: By: Genevie Ann M.D. On: 04/14/2016 17:55      Scheduled Meds: . ampicillin-sulbactam (UNASYN) IV  3 g Intravenous Q6H  . aspirin EC  81 mg Oral Daily  . atorvastatin  20 mg Oral QHS  . cholecalciferol  1,000 Units Oral Daily  . dextromethorphan-guaiFENesin  1 tablet Oral BID  . feeding supplement (ENSURE ENLIVE)  237 mL Oral BID BM  . latanoprost  1 drop Both Eyes QHS  . multivitamin with minerals  1 tablet Oral q morning - 10a  . pantoprazole  40 mg Oral BID AC  . predniSONE  5 mg Oral Daily  . sucralfate  1 g Oral QHS   Continuous Infusions: . sodium chloride 100 mL/hr at 04/15/16 2200  . heparin 950 Units/hr (04/16/16 1537)     LOS: 1 day    Time spent in minutes: 44    [place], MD [facility] Pager: [email or web address] Password [facility] 04/16/2016, 4:27 PM

## 2016-04-16 NOTE — Progress Notes (Signed)
ANTICOAGULATION CONSULT NOTE  Pharmacy Consult for Heparin Indication: pulmonary embolus  No Known Allergies  Patient Measurements: Height: 5\' 9"  (175.3 cm) Weight: 192 lb 10.9 oz (87.4 kg) IBW/kg (Calculated) : 70.7   Vital Signs: Temp: 97.9 F (36.6 C) (01/11 0035) Temp Source: Oral (01/11 0035) BP: 151/76 (01/11 0035) Pulse Rate: 64 (01/11 0035)  Labs:  Recent Labs  04/14/16 1207 04/14/16 2243 04/15/16 0818 04/16/16 0111  HGB 15.3  --  12.9* 11.8*  HCT 43.5  --  37.5* 33.6*  PLT 277  --  279 277  HEPARINUNFRC  --   --   --  1.60*  CREATININE 1.32*  --  1.08  --   CKTOTAL  --  25*  --   --     Estimated Creatinine Clearance: 60.7 mL/min (by C-G formula based on SCr of 1.08 mg/dL).   Medical History: Past Medical History:  Diagnosis Date  . Dyslipidemia   . GERD (gastroesophageal reflux disease)   . Glaucoma   . Hyperlipidemia   . Hypertension   . Insomnia   . Myasthenia gravis ([place]) 09/08/2012  . Obesity   . Ocular myasthenia gravis ([place])   . Prostate cancer [facility])    Assessment: 82 yo male with bilateral PE with RV strain on heparin. The initial heparin level is 1.6 on 1400 units/hr.   Goal of Therapy:  Heparin level 0.3-0.7 units/ml Monitor platelets by anticoagulation protocol: Yes   Plan:  Hold for 1 hour and decrease to 1150 units/hr Daily heparin level and CBC while on heparin.  Hildred Laser, Pharm D 04/16/2016 3:19 AM

## 2016-04-16 NOTE — Progress Notes (Signed)
OT Cancellation Note  Patient Details Name: Elijah Meharg Sr. MRN: [id number] DOB: [birth year]   Cancelled Treatment:    Reason Eval/Treat Not Completed: Patient at procedure or test/ unavailable. Will follow.  Malka So 04/16/2016, 10:28 AM  [phone]

## 2016-04-16 NOTE — Progress Notes (Signed)
  2D Echocardiogram has been performed.  Darlina Sicilian M 04/16/2016, 10:52 AM

## 2016-04-16 NOTE — Care Management Note (Addendum)
Case Management Note  Patient Details  Name: Kamarrion Schoessow Sr. MRN: [id number] Date of Birth: Jun 20, 1934  Subjective/Objective:   Presents with Bil PE, lung Mass, acute resp failure, on heparin, per pt/ot eval no pt/ot follow up needed.  NCM will cont to follow for dc needs.     1/12 1212- patient is indep pta, he uses a cane occasionally, he goes to the [facility] in [place], he does not know his pcp 's name there, NCM will fax his dc paper work to his pcp at the [facility] with new meds. NCM awaitng benefit check for eliquis. NCM gave patient 30 savings card for eliquis.               Action/Plan:   Expected Discharge Date:                  Expected Discharge Plan:  Home/Self Care  In-House Referral:     Discharge planning Services  CM Consult  Post Acute Care Choice:    Choice offered to:     DME Arranged:    DME Agency:     HH Arranged:    HH Agency:     Status of Service:  In process, will continue to follow  If discussed at Long Length of Stay Meetings, dates discussed:    Additional Comments:  Zenon Mayo, RN 04/16/2016, 3:32 PM

## 2016-04-17 ENCOUNTER — Other Ambulatory Visit: Payer: Self-pay

## 2016-04-17 DIAGNOSIS — K209 Esophagitis, unspecified without bleeding: Secondary | ICD-10-CM

## 2016-04-17 DIAGNOSIS — K2900 Acute gastritis without bleeding: Secondary | ICD-10-CM

## 2016-04-17 DIAGNOSIS — K210 Gastro-esophageal reflux disease with esophagitis: Secondary | ICD-10-CM

## 2016-04-17 DIAGNOSIS — K2970 Gastritis, unspecified, without bleeding: Secondary | ICD-10-CM

## 2016-04-17 DIAGNOSIS — J984 Other disorders of lung: Secondary | ICD-10-CM

## 2016-04-17 LAB — BASIC METABOLIC PANEL
ANION GAP: 8 (ref 5–15)
BUN: 11 mg/dL (ref 6–20)
CO2: 23 mmol/L (ref 22–32)
Calcium: 8.3 mg/dL — ABNORMAL LOW (ref 8.9–10.3)
Chloride: 110 mmol/L (ref 101–111)
Creatinine, Ser: 1.02 mg/dL (ref 0.61–1.24)
GFR calc Af Amer: >60 mL/min (ref >60)
Glucose, Bld: 91 mg/dL (ref 65–99)
POTASSIUM: 3.3 mmol/L — AB (ref 3.5–5.1)
Sodium: 141 mmol/L (ref 135–145)

## 2016-04-17 LAB — CBC
HEMATOCRIT: 34.5 % — AB (ref 39.0–52.0)
Hemoglobin: 11.9 g/dL — ABNORMAL LOW (ref 13.0–17.0)
MCH: 31 pg (ref 26.0–34.0)
MCHC: 34.5 g/dL (ref 30.0–36.0)
MCV: 89.8 fL (ref 78.0–100.0)
Platelets: 324 10*3/uL (ref 150–400)
RBC: 3.84 MIL/uL — AB (ref 4.22–5.81)
RDW: 12.8 % (ref 11.5–15.5)
WBC: 6.6 10*3/uL (ref 4.0–10.5)

## 2016-04-17 LAB — HEPARIN LEVEL (UNFRACTIONATED)
Heparin Unfractionated: 0.47 IU/mL (ref 0.30–0.70)
Heparin Unfractionated: 0.47 IU/mL (ref 0.30–0.70)

## 2016-04-17 MED ORDER — APIXABAN 5 MG PO TABS
10.0000 mg | ORAL_TABLET | Freq: Two times a day (BID) | ORAL | Status: DC
  Administered 2016-04-17: 10 mg via ORAL
  Filled 2016-04-17: qty 2

## 2016-04-17 MED ORDER — OMEPRAZOLE 20 MG PO CPDR: 40.0000 mg | DELAYED_RELEASE_CAPSULE | Freq: Two times a day (BID) | ORAL | 0 refills | Status: DC

## 2016-04-17 MED ORDER — APIXABAN 5 MG PO TABS: 5.0000 mg | ORAL_TABLET | Freq: Two times a day (BID) | ORAL | Status: DC

## 2016-04-17 MED ORDER — GI COCKTAIL ~~LOC~~
30.0000 mL | Freq: Three times a day (TID) | ORAL | Status: DC | PRN
  Administered 2016-04-17: 30 mL via ORAL
  Filled 2016-04-17: qty 30

## 2016-04-17 MED ORDER — APIXABAN 5 MG PO TABS: 10.0000 mg | ORAL_TABLET | Freq: Two times a day (BID) | ORAL | 0 refills | Status: DC

## 2016-04-17 MED ORDER — APIXABAN 5 MG PO TABS: 5.0000 mg | ORAL_TABLET | Freq: Two times a day (BID) | ORAL | 0 refills | Status: DC

## 2016-04-17 MED ORDER — AMOXICILLIN-POT CLAVULANATE 875-125 MG PO TABS: 1.0000 | ORAL_TABLET | Freq: Two times a day (BID) | ORAL | 0 refills | Status: DC

## 2016-04-17 MED ORDER — GI COCKTAIL ~~LOC~~
30.0000 mL | Freq: Once | ORAL | Status: AC
  Administered 2016-04-17: 30 mL via ORAL
  Filled 2016-04-17: qty 30

## 2016-04-17 MED ORDER — POTASSIUM CHLORIDE CRYS ER 20 MEQ PO TBCR
40.0000 meq | EXTENDED_RELEASE_TABLET | Freq: Two times a day (BID) | ORAL | Status: DC
  Administered 2016-04-17: 40 meq via ORAL
  Filled 2016-04-17: qty 2

## 2016-04-17 NOTE — Progress Notes (Signed)
Patient discharged in wheelchair by Nt.

## 2016-04-17 NOTE — Progress Notes (Signed)
ANTICOAGULATION CONSULT NOTE  Pharmacy Consult for Heparin Indication: pulmonary embolus and DVTs  No Known Allergies  Patient Measurements: Height: 5\' 9"  (175.3 cm) Weight: 192 lb 10.9 oz (87.4 kg) IBW/kg (Calculated) : 70.7   Vital Signs: Temp: 98 F (36.7 C) (01/11 2313) Temp Source: Oral (01/11 2313) BP: 162/71 (01/11 2313) Pulse Rate: 59 (01/11 2313)  Labs:  Recent Labs  04/14/16 1207 04/14/16 2243 04/15/16 0818 04/16/16 0111 04/16/16 1225 04/16/16 2319  HGB 15.3  --  12.9* 11.8*  --   --   HCT 43.5  --  37.5* 33.6*  --   --   PLT 277  --  279 277  --   --   HEPARINUNFRC  --   --   --  1.60* 1.01* 0.47  CREATININE 1.32*  --  1.08  --   --   --   CKTOTAL  --  25*  --   --   --   --     Estimated Creatinine Clearance: 60.7 mL/min (by C-G formula based on SCr of 1.08 mg/dL).  Assessment: 82 yo male on heparin for bilateral PE with RV strain and DVTs. Heparin level now down to therapeutic (0.47) on gtt at 950 units/hr. No bleeding noted.  Goal of Therapy:  Heparin level 0.3-0.7 units/ml Monitor platelets by anticoagulation protocol: Yes   Plan:  1. Continue heparin infusion at 950 units/hr 2. Repeat heparin level in 8 hours to confirm remains therapeutic  Sherlon Handing, PharmD, BCPS Clinical pharmacist, pager [phone] 04/17/2016, 12:15 AM

## 2016-04-17 NOTE — Progress Notes (Signed)
Discharge information explained with patient, wife, and daughter. Went over medications and when to take them, when to call the Md, follow up appointments, antibiotic information, Eliquis information, and signs and symptoms to look for.

## 2016-04-17 NOTE — Progress Notes (Signed)
Pharmacy Antibiotic Note Elijah Elijah Sr. is a 82 y.o. male admitted on 04/14/2016 with weakness and FTT.   Found to have bilateral PE, DVT's and concern for aspiration pneumonia. Currently on day 2 of Unasyn for treatment.   Plan: 1. Continue Unasyn 3 grams IV every 6 hours 2. Noted plans to switch to Augmentin on discharge   Height: 5\' 9"  (175.3 cm) Weight: 192 lb 10.9 oz (87.4 kg) IBW/kg (Calculated) : 70.7  Temp (24hrs), Avg:97.9 F (36.6 C), Min:97.3 F (36.3 C), Max:98.1 F (36.7 C)   Recent Labs Lab 04/14/16 1207 04/14/16 2201 04/15/16 0818 04/16/16 0111 04/17/16 0622  WBC 10.3  --  9.5 8.0 6.6  CREATININE 1.32*  --  1.08  --  1.02  LATICACIDVEN  --  1.2  --   --   --     Estimated Creatinine Clearance: 64.3 mL/min (by C-G formula based on SCr of 1.02 mg/dL).    No Known Allergies  Antibiotics: vancomycin x 1 dose at ~12:30 am on 1/10 cefepime x 1 dose ~88mn 1/10 Azithromycin 500 mg PO x 1 on 1/10 ~1:30am  Unasyn 1/10>>  Cultures: 1/9 blood x 2: ngtd 1/10 MRSA PCR negative   Thank you for allowing pharmacy to be a part of this patient's care.  Vincenza Hews, PharmD, BCPS 04/17/2016, 10:14 AM Pager: [phone]

## 2016-04-17 NOTE — Progress Notes (Signed)
This RN notified by CCMD that the pts rhythm changed to brady (40's) with 2nd degree HB.  12-lead EKG obtained, Baltazar Najjar NP notified. Pt asymptomatic at this time with no complaints. During EKG pt converted to NSR with 1st degree HB, which was his previous rhythm. Will continue to monitor.

## 2016-04-17 NOTE — Care Management Note (Addendum)
Case Management Note  Patient Details  Name: Elijah Raimondo Sr. MRN: [id number] Date of Birth: 12-Mar-1935  Subjective/Objective:   Presents with Bil PE, lung Mass, acute resp failure, on heparin, per pt/ot eval no pt/ot follow up needed.  NCM will cont to follow for dc needs.     1/12 1212- patient is indep pta, he uses a cane occasionally, he goes to the [facility] in [place], he does not know his pcp 's name there, NCM will fax his dc paper work to his pcp at the [facility] [place] with new meds. NCM awaitng benefit check for eliquis. NCM gave patient 30 savings card for eliquis.  Patient's PCP at the [facility] is Dr. Hortencia Conradi fax is  [phone].  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                             Action/Plan:   Expected Discharge Date:  04/17/16               Expected Discharge Plan:  Home/Self Care  In-House Referral:     Discharge planning Services  CM Consult  Post Acute Care Choice:    Choice offered to:     DME Arranged:    DME Agency:     HH Arranged:    HH Agency:     Status of Service:  Completed, signed off  If discussed at [employer] of Stay Meetings, dates discussed:    Additional Comments:  Zenon Mayo, RN 04/17/2016, 12:19 PM

## 2016-04-17 NOTE — Progress Notes (Addendum)
Physical Therapy Treatment Patient Details Name: Elijah Sibaja Sr. MRN: [id number] DOB: April 30, 1934 Today's Date: 04/17/2016    History of Present Illness Pt is a 82 y.o. male with medical history significant of hypertension, hyperlipidemia, GERD, prostate cancer (s/p radiation seed), right eye myasthenia gravis, who presents with generalized weakness, decreased oral intake, nausea. Currently being treated for acute respiratory failure with hypoxia.  Pt + bil PE and bil DVT    PT Comments    Pt tolerated session well today.  Recommend pt use RW for safe mobility at this time.  Pt has RW at home to use so no equipment needed.  Pt amb on RA throughout session and O2 sats maintained > 93% throughout.  Follow Up Recommendations  No PT follow up;Supervision - Intermittent     Equipment Recommendations  None recommended by PT (pt has RW at home)    Recommendations for Other Services       Precautions / Restrictions Precautions Precautions: Fall Restrictions Weight Bearing Restrictions: No    Mobility  Bed Mobility Overal bed mobility: Modified Independent             General bed mobility comments: No physical assist required. HOB elevated  Transfers Overall transfer level: Modified independent Equipment used: Rolling walker (2 wheeled) Transfers: Sit to/from Stand Sit to Stand: Modified independent (Device/Increase time)            Ambulation/Gait Ambulation/Gait assistance: Supervision Ambulation Distance (Feet): 200 Feet Assistive device: Rolling walker (2 wheeled) Gait Pattern/deviations: WFL(Within Functional Limits) Gait velocity: decreased Gait velocity interpretation: Below normal speed for age/gender General Gait Details: pt with improved stability with RW; recommend pt use at this time for safety   Stairs            Wheelchair Mobility    Modified Rankin (Stroke Patients Only)       Balance   Sitting-balance support: No upper extremity  supported;Feet supported Sitting balance-Leahy Scale: Good     Standing balance support: Bilateral upper extremity supported;During functional activity Standing balance-Leahy Scale: Good                      Cognition Arousal/Alertness: Awake/alert Behavior During Therapy: WFL for tasks assessed/performed Overall Cognitive Status: Within Functional Limits for tasks assessed                      Exercises      General Comments        Pertinent Vitals/Pain Pain Assessment: No/denies pain    Home Living                      Prior Function            PT Goals (current goals can now be found in the care plan section) Acute Rehab PT Goals Patient Stated Goal: to get well PT Goal Formulation: With patient Time For Goal Achievement: 04/29/16 Potential to Achieve Goals: Good Progress towards PT goals: Progressing toward goals    Frequency    Min 3X/week      PT Plan Current plan remains appropriate    Co-evaluation             End of Session Equipment Utilized During Treatment: Gait belt Activity Tolerance: Patient tolerated treatment well Patient left: in bed;with call bell/phone within reach;with family/visitor present     Time: [id number] PT Time Calculation (min) (ACUTE ONLY): 23 min  Charges:  $Gait Training: 8-22 mins $  Therapeutic Activity: 8-22 mins                    G Codes:          Laureen Abrahams, PT, DPT 04/17/16 11:09 AM [phone]

## 2016-04-17 NOTE — Progress Notes (Signed)
S/W PAT @ [facility] # [id number]   ELIQUIS  10 MG -NO   1.ELIQUIS 2.5 MG BID  COVER- YES  CO-PAY- $ 233.01  Q/L 2 PER DAY  DEDUCTIBLE NOT MET  TIER- 4 DRUG  PRIOR APPROVAL- NO   2. ELIQUIS 5 MG BID   COVER- YES  CO-PAY- $ 233.00  Q/L 2 PER DAY DEDUCTIBLE NOT MET  TIER- 4 DRUG  PRIOR APPROVAL-NO   PHARMACY : [facility]

## 2016-04-17 NOTE — Discharge Instructions (Signed)

## 2016-04-17 NOTE — Progress Notes (Deleted)
Physician Discharge Summary  Elijah Domer Sr. [id number] DOB: 09/29/1934 DOA: 04/14/2016  PCP: [place] date: 04/14/2016 Discharge date: 04/17/2016  Admitted From: home Disposition:  home   Recommendations for Outpatient Follow-up:  1. Will f/u with pulmonary- will need repeat CT scan in a few wks  Home Health:  none  Equipment/Devices:  none    Discharge Condition:  stable   CODE STATUS:  Full code   Diet recommendation:  Heart healthy Consultations:  pulmonary    Discharge Diagnoses:  Principal Problem:   Acute respiratory failure with hypoxia ([place]) Active Problems:   Pulmonary emboli (H[facility])   Cavitating mass of lung   Myasthenia gravis ([place])   ARF (acute renal failure) (HCC)   Essential hypertension   HLD (hyperlipidemia)   GERD (gastroesophageal reflux disease)   Hypokalemia   Weakness   Acute esophagitis   Gastritis    Subjective: He had central chest and upper abdominal pain when drinking juice today. It resolved with a GI cocktail and he was able to finish his meal without complaints.  No dyspnea at rest or with exertion.  Cough has resolved.   Brief Summary: Elijah Jackson a 82 y.o.malewith medical history significant of hypertension, hyperlipidemia, GERD, prostate cancer (s/pradiation seed), right eyemyasthenia gravis, who presents with generalized weakness, decreased oral intake, nausea.  Patient states that he was recently hospitalized from 12/28-12/29 for acute gastroenteritis with vomiting and diarrhea. After being discharged, he continued to have nausea, poor oral intake and generalized weakness. He had 10 pounds since 12/29. He does not have vomiting, diarrhea or abdominal pain.  He was found to have oxygen desaturation to 88% in ED and was admitted for further work up. CT abd/ pelvis done in ER showed incidental lung finding- see below.  Hospital Course:  Principal Problem:   Acute respiratory failure with hypoxia  -  pulse ox on admission 88 % on room air and 92% on 2 L O2   - CT abd/pelvis was suspicious for lung mass vs pneumonia- CXR suspicious for pneumonia   - CT with contrast not done on admission due to elevated Cr  - 1/10- obtained CT chest with contrast which showed bilateral PE with RV strain- transfered to stepdown, started Heparin infusion -2-D echo does not show right heart strain - lower extremity venous duplex shows b/l LE DVT (see report below) which likely occurred during his sedentary period over the past 2-3 wks.  - has now been weaned off of O2  - Heparin switched to Eliquis - will f/u with pulmonary as outpt  Cavitating Lung mass - CT showing 4.2 cm cavitary pulmonary mass- the thought is that he may have aspirated when he was having vomiting a few wks ago.  - started on Unasyn which I have switched to Augmentin- will need a total of 2 weeks of Augmentin  - his cough has resolved - needs repeat CT in a few wks to ensure resolution  Nausea/ loss of appetite/ weight loss - started 2 wks ago with the suspected gastroenteritis episode - based on his history I felt he may have Gastritis vs PUD - note he is also on chronic Prednisone - increased his daily PPI to BID - on Sucralfate QHS only- cont this dose for now - Nausea has resolved and he has regained his appetite - had chest and epigastric pain today with orange juice which resolved after a GI cocktail- have advised to avoid acidic food - advised to use  PRN Maalox addition to PPI    Myasthenia gravis - cont home Prednisone    ARF (acute renal failure) and orthostatic hypotension - due to poor oral intake  - resolved with IVF    Essential hypertension - Lisinopril held due to AKI-as creatinine has improved and BP elevated,  resumed lisinopril  Procedures:  2-D echo Left ventricle: The cavity size was normal. Wall thickness was increased in a pattern of moderate LVH. Systolic function was vigorous. The estimated  ejection fraction was in the range of 65% to 70%. Wall motion was normal; there were no regional wall motion abnormalities. - Left atrium: The atrium was mildly to moderately dilated.   Bilateral lower extremity venous duplex  Right lower extremity is positive for deep vein thrombosis in the proximal gastrocnemius vein.  Left lower extremity is positive for acute and age indeterminate deep vein thrombosis in the femoral and popliteal veins.   Discharge Instructions  Discharge Instructions    Diet - low sodium heart healthy    Complete by:  As directed    Increase activity slowly    Complete by:  As directed      Allergies as of 04/17/2016   No Known Allergies     Medication List    STOP taking these medications   aspirin 81 MG tablet     TAKE these medications   acetaminophen 325 MG tablet Commonly known as:  TYLENOL Take 325-650 mg by mouth every 6 (six) hours as needed for headache (or pain).   apixaban 5 MG Tabs tablet Commonly known as:  ELIQUIS Take 2 tablets (10 mg total) by mouth 2 (two) times daily.   apixaban 5 MG Tabs tablet Commonly known as:  ELIQUIS Take 1 tablet (5 mg total) by mouth 2 (two) times daily. Start taking on:  04/24/2016   atorvastatin 20 MG tablet Commonly known as:  LIPITOR Take 20 mg by mouth at bedtime.   latanoprost 0.005 % ophthalmic solution Commonly known as:  XALATAN Place 1 drop into both eyes at bedtime.   lisinopril 20 MG tablet Commonly known as:  PRINIVIL,ZESTRIL Take 20 mg by mouth every morning.   omeprazole 20 MG capsule Commonly known as:  PRILOSEC Take 2 capsules (40 mg total) by mouth 2 (two) times daily before a meal. What changed:  how much to take  when to take this   ondansetron 4 MG disintegrating tablet Commonly known as:  ZOFRAN ODT Take 1 tablet (4 mg total) by mouth every 8 (eight) hours as needed for nausea or vomiting.   ONE-A-DAY MENS 50+ ADVANTAGE Tabs Take 1 tablet by mouth every  morning.   predniSONE 5 MG tablet Commonly known as:  DELTASONE Take 5 mg by mouth daily.   sucralfate 1 GM/10ML suspension Commonly known as:  CARAFATE Take 10 mLs by mouth at bedtime.   Vitamin D-3 1000 units Caps Take 1,000 Units by mouth daily.      Follow-up Information    Rexene Edison, NP Follow up on 04/30/2016.   Specialty:  Pulmonary Disease Why:  4pm  Contact information: [address] [place] [address] [phone]          No Known Allergies   Procedures/Studies  Dg Chest 2 View  Result Date: 04/14/2016 CLINICAL DATA:  82 y/o  M; concern for pneumonia. EXAM: CHEST  2 VIEW COMPARISON:  05/08/2004 chest CT FINDINGS: Stable cardiac silhouette. Consolidation in the left upper lobe lingula. No pleural effusion. Mild degenerative changes  of thoracic spine. Cholecystectomy clips. IMPRESSION: Consolidation in the left upper lobe lingula probably represents pneumonia. Follow-up to resolution is recommended with radiographs to exclude an underlying lesion. These results were called by telephone at the time of interpretation on 04/14/2016 at 8:17 pm to Dr. Nanda Quinton , who verbally acknowledged these results. Electronically Signed   By: Kristine Garbe M.D.   On: 04/14/2016 20:18   Ct Chest W Contrast  Result Date: 04/15/2016 CLINICAL DATA:  Pneumonia versus mass, abnormal chest radiograph EXAM: CT CHEST WITH CONTRAST TECHNIQUE: Multidetector CT imaging of the chest was performed during intravenous contrast administration. Sagittal and coronal MPR images reconstructed from axial data set. CONTRAST:  ISOVUE-300 IOPAMIDOL (ISOVUE-300) INJECTION 61% IV COMPARISON:  05/08/2004; correlation chest radiograph 04/14/2016 FINDINGS: Cardiovascular: Atherosclerotic calcifications aorta and coronary arteries. Aorta normal caliber. No pericardial effusion. Filling defects identified in LEFT lower lobe pulmonary arteries and at the bifurcation of the RIGHT pulmonary artery  extending into RIGHT middle and RIGHT lower lobes compatible with large BILATERAL pulmonary emboli. RV:LV ratio = 1.11 Mediastinum/Nodes: Esophagus unremarkable. Base of cervical region normal appearance. Few scattered normal sized mediastinal lymph nodes without thoracic adenopathy. Lungs/Pleura: Masslike area of opacity with cavitation identified in the lingula, 4.2 x 2.3 x 3.2 cm, question neoplasm versus cavitary pneumonia. Scattered mild reticulonodular infiltrates in lingula and LEFT lower lobe. Focus of infiltrate laterally in the posterior RIGHT upper lobe adjacent to major fissure. Central peribronchial thickening. Minimal atelectasis or scarring in RIGHT middle lobe. No additional pulmonary mass identified. No significant pleural fluid collection. Upper Abdomen: Gallbladder surgically absent. Visualized upper abdomen otherwise normal. Musculoskeletal: Demineralized.  No focal osseous abnormalities. IMPRESSION: BILATERAL pulmonary emboli, including in LEFT lower lobe and a saddle embolus at the RIGHT pulmonary artery bifurcation. Positive for acute PE with CT evidence of right heart strain (RV/LV Ratio = ) consistent with at least submassive (intermediate risk) PE. The presence of right heart strain has been associated with an increased risk of morbidity and mortality. Please activate Code PE by paging [phone]. Cavitary mass versus pneumonia in the LEFT upper lobe/ lingula, 4.2 x 2.3 x 3.2 cm; followup assessment until resolution recommended to exclude neoplasm. Reticulonodular infiltrates in lingula and LEFT lower lobe. Aortic atherosclerosis and coronary arterial calcification. Critical Value/emergent results were called by telephone at the time of interpretation on 04/15/2016 at 1508 hr to Dr. Debbe Odea , who verbally acknowledged these results. Electronically Signed   By: Lavonia Dana M.D.   On: 04/15/2016 15:12   Ct Abdomen Pelvis W Contrast  Addendum Date: 04/14/2016   ADDENDUM REPORT:  04/14/2016 18:35 ADDENDUM: Study discussed by telephone with Dr. Vonna Kotyk LONG on 04/14/2016 at 1805 hours. Electronically Signed   By: Genevie Ann M.D.   On: 04/14/2016 18:35   Result Date: 04/14/2016 CLINICAL DATA:  82 year old male with unintentional weight loss since December. Epigastric abdominal pain. Increased weakness since hospitalization at [facility] in late December. Initial encounter. Personal history of prostate cancer. EXAM: CT ABDOMEN AND PELVIS WITH CONTRAST TECHNIQUE: Multidetector CT imaging of the abdomen and pelvis was performed using the standard protocol following bolus administration of intravenous contrast. CONTRAST:  158mL ISOVUE-300 IOPAMIDOL (ISOVUE-300) INJECTION 61% I was called by CT to examine this patient for contrast extravasation to the left upper extremity at 1715 hours. There is intravenous contrast present on the study. 40 mL to at most 60 mL of IV contrast extravasated into the proximal left ventral arm. The patient was in no distress.  There was ecchymosis throughout the extravasation area which was swollen, indicating a degree of superimposed hematoma. I reviewed the natural history of contrast extravasation and advised the patient to keep the extremity elevated, use ice, and over the counter are anti-inflammatory medicine. I recommended he return to the ED for any increasing left upper extremity pain or skin changes. COMPARISON:  CT Abdomen and Pelvis 01/07/2014. FINDINGS: Lower chest: Partially visible 3-4 cm mass like area in the left lingula (series 4, image 1). Surrounding reticulonodular density in both the lingula and the left lower lobe (series 4, image 4). No associated left pleural effusion. Right lung base is stable since 2015 and negative. No right pleural effusion. No pericardial effusion. No upper abdominal free air. Intermittent mild motion artifact in the abdomen. Hepatobiliary: Surgically absent gallbladder as before. Stable intra and  extrahepatic biliary tree. Negative liver. Pancreas: Negative. Spleen: Negative. No abdominal free fluid. Adrenals/Urinary Tract: Normal adrenal glands. Bilateral renal enhancement and contrast excretion is normal. No hydronephrosis. No urologic calculus identified. Unremarkable urinary bladder. Stomach/Bowel: Negative rectum. Retained stool mixed with contrast in the distal colon. Redundant sigmoid colon with diverticulosis, but no active inflammation identified. Negative left colon. Dense retained oral contrast at the splenic flexure and throughout the transverse colon which otherwise is negative. Similar dense retained contrast in the right colon. Negative right colon and appendix. Negative terminal ileum. No dilated small bowel. Decompressed stomach. Negative duodenum. Vascular/Lymphatic: Aortoiliac calcified atherosclerosis noted. Major arterial structures in the abdomen and pelvis are patent. Portal venous system is patent. No lymphadenopathy. Reproductive: Prostate brachytherapy changes. Small fat containing left inguinal hernia is stable. Other: No pelvic free fluid. Musculoskeletal: Degenerative changes in the spine. No acute or suspicious osseous lesion identified. IMPRESSION: 1. IV contrast extravasation occurred along with some hematoma into the patient's left upper extremity as detailed in the Contrast section above. Nonetheless adequate intravenous contrast on the study. 2. Partially visible 3-4 cm mass like opacity in the peripheral lingula with surrounding lingula and left lower lobe tree-in-bud type nodular pulmonary opacity. No associated pleural effusion. Recommend follow-up PA and lateral chest radiographs, but at this point favor a multilobar pneumonia over left lung bronchogenic carcinoma. Query fever. If infection is suspected clinically then Followup PA and lateral chest X-ray is recommended in 3-4 weeks following trial of antibiotic therapy to ensure resolution and exclude underlying  malignancy. 3. No acute or inflammatory process identified in the abdomen or pelvis. Normal appendix. Diverticulosis of the sigmoid colon without active inflammation. 4.  Calcified aortic atherosclerosis. Electronically Signed: By: Genevie Ann M.D. On: 04/14/2016 17:55       Discharge Exam: Vitals:   04/17/16 0700 04/17/16 0801  BP: (!) 159/75 (!) 169/74  Pulse: 61   Resp: 15   Temp: 98.1 F (36.7 C)    Vitals:   04/16/16 2313 04/17/16 0421 04/17/16 0700 04/17/16 0801  BP: (!) 162/71 (!) 141/59 (!) 159/75 (!) 169/74  Pulse: (!) 59 60 61   Resp: (!) 23 20 15    Temp: 98 F (36.7 C) 97.3 F (36.3 C) 98.1 F (36.7 C)   TempSrc: Oral Axillary Oral   SpO2: 95% 95%  94%  Weight:      Height:        General: Pt is alert, awake, not in acute distress Cardiovascular: RRR, S1/S2 +, no rubs, no gallops Respiratory: CTA bilaterally, no wheezing, no rhonchi Abdominal: Soft, NT, ND, bowel sounds + Extremities: no edema, no cyanosis    The results of  significant diagnostics from this hospitalization (including imaging, microbiology, ancillary and laboratory) are listed below for reference.     Microbiology: Recent Results (from the past 240 hour(s))  Culture, blood (routine x 2) Call MD if unable to obtain prior to antibiotics being given     Status: None (Preliminary result)   Collection Time: 04/14/16 10:01 PM  Result Value Ref Range Status   Specimen Description BLOOD RIGHT HAND  Final   Special Requests IN PEDIATRIC BOTTLE 3CC  Final   Culture NO GROWTH 2 DAYS  Final   Report Status PENDING  Incomplete  Culture, blood (routine x 2) Call MD if unable to obtain prior to antibiotics being given     Status: None (Preliminary result)   Collection Time: 04/14/16 10:39 PM  Result Value Ref Range Status   Specimen Description BLOOD LEFT ARM  Final   Special Requests BOTTLES DRAWN AEROBIC AND ANAEROBIC 5ML  Final   Culture NO GROWTH 2 DAYS  Final   Report Status PENDING  Incomplete   MRSA PCR Screening     Status: None   Collection Time: 04/15/16 11:28 PM  Result Value Ref Range Status   MRSA by PCR NEGATIVE NEGATIVE Final    Comment:        The GeneXpert MRSA Assay (FDA approved for NASAL specimens only), is one component of a comprehensive MRSA colonization surveillance program. It is not intended to diagnose MRSA infection nor to guide or monitor treatment for MRSA infections.      Labs: BNP (last 3 results) No results for input(s): BNP in the last 8760 hours. Basic Metabolic Panel:  Recent Labs Lab 04/14/16 1207 04/15/16 0818 04/17/16 0622  NA 136 139 141  K 3.4* 3.8 3.3*  CL 100* 110 110  CO2 25 23 23   GLUCOSE 114* 106* 91  BUN 20 19 11   CREATININE 1.32* 1.08 1.02  CALCIUM 8.8* 8.0* 8.3*   Liver Function Tests:  Recent Labs Lab 04/14/16 1633  AST 22  ALT 15*  ALKPHOS 73  BILITOT 0.2*  PROT 6.5  ALBUMIN 3.1*    Recent Labs Lab 04/14/16 1633  LIPASE 29   No results for input(s): AMMONIA in the last 168 hours. CBC:  Recent Labs Lab 04/14/16 1207 04/15/16 0818 04/16/16 0111 04/17/16 0622  WBC 10.3 9.5 8.0 6.6  HGB 15.3 12.9* 11.8* 11.9*  HCT 43.5 37.5* 33.6* 34.5*  MCV 90.4 91.0 89.4 89.8  PLT 277 279 277 324   Cardiac Enzymes:  Recent Labs Lab 04/14/16 2243  CKTOTAL 25*   BNP: Invalid input(s): POCBNP CBG:  Recent Labs Lab 04/14/16 1224  GLUCAP 117*   D-Dimer No results for input(s): DDIMER in the last 72 hours. Hgb A1c No results for input(s): HGBA1C in the last 72 hours. Lipid Profile No results for input(s): CHOL, HDL, LDLCALC, TRIG, CHOLHDL, LDLDIRECT in the last 72 hours. Thyroid function studies No results for input(s): TSH, T4TOTAL, T3FREE, THYROIDAB in the last 72 hours.  Invalid input(s): FREET3 Anemia work up No results for input(s): VITAMINB12, FOLATE, FERRITIN, TIBC, IRON, RETICCTPCT in the last 72 hours. Urinalysis    Component Value Date/Time   COLORURINE YELLOW 04/14/2016 1900    APPEARANCEUR CLEAR 04/14/2016 1900   APPEARANCEUR Clear 01/07/2014 1435   LABSPEC 1.028 04/14/2016 1900   LABSPEC 1.020 01/07/2014 1435   PHURINE 5.0 04/14/2016 1900   GLUCOSEU NEGATIVE 04/14/2016 1900   GLUCOSEU Negative 01/07/2014 1435   HGBUR NEGATIVE 04/14/2016 1900   BILIRUBINUR NEGATIVE 04/14/2016  [place] Negative 01/07/2014 [place] 04/14/2016 1900   PROTEINUR NEGATIVE 04/14/2016 1900   NITRITE NEGATIVE 04/14/2016 1900   LEUKOCYTESUR NEGATIVE 04/14/2016 1900   LEUKOCYTESUR Negative 01/07/2014 1435   Sepsis Labs Invalid input(s): PROCALCITONIN,  WBC,  LACTICIDVEN Microbiology Recent Results (from the past 240 hour(s))  Culture, blood (routine x 2) Call MD if unable to obtain prior to antibiotics being given     Status: None (Preliminary result)   Collection Time: 04/14/16 10:01 PM  Result Value Ref Range Status   Specimen Description BLOOD RIGHT HAND  Final   Special Requests IN PEDIATRIC BOTTLE 3CC  Final   Culture NO GROWTH 2 DAYS  Final   Report Status PENDING  Incomplete  Culture, blood (routine x 2) Call MD if unable to obtain prior to antibiotics being given     Status: None (Preliminary result)   Collection Time: 04/14/16 10:39 PM  Result Value Ref Range Status   Specimen Description BLOOD LEFT ARM  Final   Special Requests BOTTLES DRAWN AEROBIC AND ANAEROBIC 5ML  Final   Culture NO GROWTH 2 DAYS  Final   Report Status PENDING  Incomplete  MRSA PCR Screening     Status: None   Collection Time: 04/15/16 11:28 PM  Result Value Ref Range Status   MRSA by PCR NEGATIVE NEGATIVE Final    Comment:        The GeneXpert MRSA Assay (FDA approved for NASAL specimens only), is one component of a comprehensive MRSA colonization surveillance program. It is not intended to diagnose MRSA infection nor to guide or monitor treatment for MRSA infections.      Time coordinating discharge: Over 30 minutes  SIGNED:   Debbe Odea,  MD  [facility] 04/17/2016, 11:46 AM Pager   If 7PM-7AM, please contact night-coverage [email or web address] Password [email or web address]

## 2016-04-17 NOTE — Progress Notes (Signed)
ANTICOAGULATION CONSULT NOTE  Pharmacy Consult for Heparin >> Apixaban  Indication: pulmonary embolus and DVTs  No Known Allergies  Patient Measurements: Height: 5\' 9"  (175.3 cm) Weight: 192 lb 10.9 oz (87.4 kg) IBW/kg (Calculated) : 70.7   Vital Signs: Temp: 98.1 F (36.7 C) (01/12 0700) Temp Source: Oral (01/12 0700) BP: 169/74 (01/12 0801) Pulse Rate: 61 (01/12 0700)  Labs:  Recent Labs  04/14/16 1207 04/14/16 2243 04/15/16 0818  04/16/16 0111 04/16/16 1225 04/16/16 2319 04/17/16 0622 04/17/16 0822  HGB 15.3  --  12.9*  --  11.8*  --   --  11.9*  --   HCT 43.5  --  37.5*  --  33.6*  --   --  34.5*  --   PLT 277  --  279  --  277  --   --  324  --   HEPARINUNFRC  --   --   --   < > 1.60* 1.01* 0.47  --  0.47  CREATININE 1.32*  --  1.08  --   --   --   --  1.02  --   CKTOTAL  --  25*  --   --   --   --   --   --   --   < > = values in this interval not displayed.  Estimated Creatinine Clearance: 64.3 mL/min (by C-G formula based on SCr of 1.02 mg/dL).  Assessment: 82 yo male on heparin for bilateral PE with RV strain and DVTs. Initially started on heparin but to transition to apixaban today.   Goal of Therapy:  Monitor platelets by anticoagulation protocol: Yes   Plan:  1. Apixaban 10 mg BID for 7 days followed by apixaban 5 mg BID afterwards  2. Monitor for sxs of bleeding   Vincenza Hews, PharmD, BCPS 04/17/2016, 10:06 AM Pager: [phone]

## 2016-04-17 NOTE — Discharge Summary (Signed)
Physician Discharge Summary  Elijah Beller Sr. [id number] DOB: June 05, 1934 DOA: 04/14/2016  PCP: [place] date: 04/14/2016 Discharge date: 04/17/2016  Admitted From: home Disposition:  home   Recommendations for Outpatient Follow-up:  1. Will f/u with pulmonary- will need repeat CT scan in a few wks  Home Health:  none  Equipment/Devices:  none    Discharge Condition:  stable   CODE STATUS:  Full code   Diet recommendation:  Heart healthy Consultations:  pulmonary    Discharge Diagnoses:  Principal Problem:   Acute respiratory failure with hypoxia ([place]) Active Problems:   Pulmonary emboli ([facility])   Cavitating mass of lung   Myasthenia gravis ([place])   ARF (acute renal failure) ([facility])   Essential hypertension   HLD (hyperlipidemia)   GERD (gastroesophageal reflux disease)   Hypokalemia   Weakness   Acute esophagitis   Gastritis    Subjective: He had central chest and upper abdominal pain when drinking juice today. It resolved with a GI cocktail and he was able to finish his meal without complaints.  No dyspnea at rest or with exertion.  Cough has resolved.   Brief Summary: Elijah Jacksonis a 82 y.o.malewith medical history significant of hypertension, hyperlipidemia, GERD, prostate cancer (s/pradiation seed), right eyemyasthenia gravis, who presents with generalized weakness, decreased oral intake, nausea.  Patient states that he was recently hospitalized from 12/28-12/29 for acute gastroenteritis with vomiting and diarrhea. After being discharged, he continued to have nausea, poor oral intake and generalized weakness. He had 10 pounds since 12/29. He does not have vomiting, diarrhea or abdominal pain.  He was found to have oxygen desaturation to 88% in ED and was admitted for further work up. CT abd/ pelvis done in ER showed incidental lung finding- see below.  Hospital Course:  Principal Problem:   Acute respiratory failure with hypoxia  -  pulse ox on admission 88 % on room air and 92% on 2 L O2   - CT abd/pelvis was suspicious for lung mass vs pneumonia- CXR suspicious for pneumonia   - CT with contrast not done on admission due to elevated Cr  - 1/10- obtained CT chest with contrast which showed bilateral PE with RV strain- transfered to stepdown, started Heparin infusion -2-D echo does not show right heart strain - lower extremity venous duplex shows b/l LE DVT (see report below) which likely occurred during his sedentary period over the past 2-3 wks.  - has now been weaned off of O2  - Heparin switched to Eliquis - will f/u with pulmonary as outpt  Cavitating Lung mass - CT showing 4.2 cm cavitary pulmonary mass in LUL/ Lingula- the suspicion is that he may have aspirated when he was having vomiting a few wks ago.  - started on Unasyn which I have switched to Augmentin- will need a total of 2 weeks of antibiotics and re-assessment - He admitted to having a dry cough prior to admission- this cough has resolved - needs repeat CT in a few wks to ensure resolution  Nausea/ loss of appetite/ weight loss -x 2 wks-  started 2 wks ago with the suspected gastroenteritis episode resulting in 10 lb wt loss - based on his history I felt he may have Gastritis/GERD - note he is also on chronic Prednisone - increased his daily PPI to BID - on Sucralfate QHS only- cont this dose for now - Nausea has resolved and he has regained his appetite - had chest and epigastric  pain today with orange juice which resolved after a GI cocktail- have advised to avoid acidic food - advised to use PRN Maalox addition to PPI    Myasthenia gravis - cont home Prednisone    ARF (acute renal failure) and orthostatic hypotension - due to poor oral intake  - resolved with IVF    Essential hypertension - Lisinopril held due to AKI-as creatinine has improved and BP elevated,  resumed lisinopril  Procedures:  2-D echo Left ventricle: The cavity size  was normal. Wall thickness was increased in a pattern of moderate LVH. Systolic function was vigorous. The estimated ejection fraction was in the range of 65% to 70%. Wall motion was normal; there were no regional wall motion abnormalities. - Left atrium: The atrium was mildly to moderately dilated.   Bilateral lower extremity venous duplex  Right lower extremity is positive for deep vein thrombosis in the proximal gastrocnemius vein.  Left lower extremity is positive for acute and age indeterminate deep vein thrombosis in the femoral and popliteal veins.   Discharge Instructions  Discharge Instructions    Diet - low sodium heart healthy    Complete by:  As directed    Increase activity slowly    Complete by:  As directed      Allergies as of 04/17/2016   No Known Allergies     Medication List    STOP taking these medications   aspirin 81 MG tablet     TAKE these medications   acetaminophen 325 MG tablet Commonly known as:  TYLENOL Take 325-650 mg by mouth every 6 (six) hours as needed for headache (or pain).   amoxicillin-clavulanate 875-125 MG tablet Commonly known as:  AUGMENTIN Take 1 tablet by mouth 2 (two) times daily.   apixaban 5 MG Tabs tablet Commonly known as:  ELIQUIS Take 2 tablets (10 mg total) by mouth 2 (two) times daily. Notes to patient:  TAKE THIS FOR ONE WEEK UNTIL 04/24/16   apixaban 5 MG Tabs tablet Commonly known as:  ELIQUIS Take 1 tablet (5 mg total) by mouth 2 (two) times daily. Start taking on:  04/24/2016 Notes to patient:  START TAKING THIS ON 04/24/16   atorvastatin 20 MG tablet Commonly known as:  LIPITOR Take 20 mg by mouth at bedtime.   latanoprost 0.005 % ophthalmic solution Commonly known as:  XALATAN Place 1 drop into both eyes at bedtime.   lisinopril 20 MG tablet Commonly known as:  PRINIVIL,ZESTRIL Take 20 mg by mouth every morning.   omeprazole 20 MG capsule Commonly known as:  PRILOSEC Take 2 capsules (40  mg total) by mouth 2 (two) times daily before a meal. What changed:  how much to take  when to take this   ondansetron 4 MG disintegrating tablet Commonly known as:  ZOFRAN ODT Take 1 tablet (4 mg total) by mouth every 8 (eight) hours as needed for nausea or vomiting.   ONE-A-DAY MENS 50+ ADVANTAGE Tabs Take 1 tablet by mouth every morning.   predniSONE 5 MG tablet Commonly known as:  DELTASONE Take 5 mg by mouth daily.   sucralfate 1 GM/10ML suspension Commonly known as:  CARAFATE Take 10 mLs by mouth at bedtime.   Vitamin D-3 1000 units Caps Take 1,000 Units by mouth daily.      Follow-up Information    Rexene Edison, NP Follow up on 04/30/2016.   Specialty:  Pulmonary Disease Why:  4pm  Contact information: [address] [address] [phone]  No Known Allergies   Procedures/Studies  Dg Chest 2 View  Result Date: 04/14/2016 CLINICAL DATA:  82 y/o  M; concern for pneumonia. EXAM: CHEST  2 VIEW COMPARISON:  05/08/2004 chest CT FINDINGS: Stable cardiac silhouette. Consolidation in the left upper lobe lingula. No pleural effusion. Mild degenerative changes of thoracic spine. Cholecystectomy clips. IMPRESSION: Consolidation in the left upper lobe lingula probably represents pneumonia. Follow-up to resolution is recommended with radiographs to exclude an underlying lesion. These results were called by telephone at the time of interpretation on 04/14/2016 at 8:17 pm to Dr. Nanda Quinton , who verbally acknowledged these results. Electronically Signed   By: Kristine Garbe M.D.   On: 04/14/2016 20:18   Ct Chest W Contrast  Result Date: 04/15/2016 CLINICAL DATA:  Pneumonia versus mass, abnormal chest radiograph EXAM: CT CHEST WITH CONTRAST TECHNIQUE: Multidetector CT imaging of the chest was performed during intravenous contrast administration. Sagittal and coronal MPR images reconstructed from axial data set. CONTRAST:  ISOVUE-300 IOPAMIDOL  (ISOVUE-300) INJECTION 61% IV COMPARISON:  05/08/2004; correlation chest radiograph 04/14/2016 FINDINGS: Cardiovascular: Atherosclerotic calcifications aorta and coronary arteries. Aorta normal caliber. No pericardial effusion. Filling defects identified in LEFT lower lobe pulmonary arteries and at the bifurcation of the RIGHT pulmonary artery extending into RIGHT middle and RIGHT lower lobes compatible with large BILATERAL pulmonary emboli. RV:LV ratio = 1.11 Mediastinum/Nodes: Esophagus unremarkable. Base of cervical region normal appearance. Few scattered normal sized mediastinal lymph nodes without thoracic adenopathy. Lungs/Pleura: Masslike area of opacity with cavitation identified in the lingula, 4.2 x 2.3 x 3.2 cm, question neoplasm versus cavitary pneumonia. Scattered mild reticulonodular infiltrates in lingula and LEFT lower lobe. Focus of infiltrate laterally in the posterior RIGHT upper lobe adjacent to major fissure. Central peribronchial thickening. Minimal atelectasis or scarring in RIGHT middle lobe. No additional pulmonary mass identified. No significant pleural fluid collection. Upper Abdomen: Gallbladder surgically absent. Visualized upper abdomen otherwise normal. Musculoskeletal: Demineralized.  No focal osseous abnormalities. IMPRESSION: BILATERAL pulmonary emboli, including in LEFT lower lobe and a saddle embolus at the RIGHT pulmonary artery bifurcation. Positive for acute PE with CT evidence of right heart strain (RV/LV Ratio = ) consistent with at least submassive (intermediate risk) PE. The presence of right heart strain has been associated with an increased risk of morbidity and mortality. Please activate Code PE by paging [phone]. Cavitary mass versus pneumonia in the LEFT upper lobe/ lingula, 4.2 x 2.3 x 3.2 cm; followup assessment until resolution recommended to exclude neoplasm. Reticulonodular infiltrates in lingula and LEFT lower lobe. Aortic atherosclerosis and coronary  arterial calcification. Critical Value/emergent results were called by telephone at the time of interpretation on 04/15/2016 at 1508 hr to Dr. Debbe Odea , who verbally acknowledged these results. Electronically Signed   By: Lavonia Dana M.D.   On: 04/15/2016 15:12   Ct Abdomen Pelvis W Contrast  Addendum Date: 04/14/2016   ADDENDUM REPORT: 04/14/2016 18:35 ADDENDUM: Study discussed by telephone with Dr. Vonna Kotyk LONG on 04/14/2016 at 1805 hours. Electronically Signed   By: Genevie Ann M.D.   On: 04/14/2016 18:35   Result Date: 04/14/2016 CLINICAL DATA:  82 year old male with unintentional weight loss since December. Epigastric abdominal pain. Increased weakness since hospitalization at [facility] in late December. Initial encounter. Personal history of prostate cancer. EXAM: CT ABDOMEN AND PELVIS WITH CONTRAST TECHNIQUE: Multidetector CT imaging of the abdomen and pelvis was performed using the standard protocol following bolus administration of intravenous contrast. CONTRAST:  188mL ISOVUE-300 IOPAMIDOL (ISOVUE-300) INJECTION  61% I was called by CT to examine this patient for contrast extravasation to the left upper extremity at 1715 hours. There is intravenous contrast present on the study. 40 mL to at most 60 mL of IV contrast extravasated into the proximal left ventral arm. The patient was in no distress. There was ecchymosis throughout the extravasation area which was swollen, indicating a degree of superimposed hematoma. I reviewed the natural history of contrast extravasation and advised the patient to keep the extremity elevated, use ice, and over the counter are anti-inflammatory medicine. I recommended he return to the ED for any increasing left upper extremity pain or skin changes. COMPARISON:  CT Abdomen and Pelvis 01/07/2014. FINDINGS: Lower chest: Partially visible 3-4 cm mass like area in the left lingula (series 4, image 1). Surrounding reticulonodular density in both the lingula  and the left lower lobe (series 4, image 4). No associated left pleural effusion. Right lung base is stable since 2015 and negative. No right pleural effusion. No pericardial effusion. No upper abdominal free air. Intermittent mild motion artifact in the abdomen. Hepatobiliary: Surgically absent gallbladder as before. Stable intra and extrahepatic biliary tree. Negative liver. Pancreas: Negative. Spleen: Negative. No abdominal free fluid. Adrenals/Urinary Tract: Normal adrenal glands. Bilateral renal enhancement and contrast excretion is normal. No hydronephrosis. No urologic calculus identified. Unremarkable urinary bladder. Stomach/Bowel: Negative rectum. Retained stool mixed with contrast in the distal colon. Redundant sigmoid colon with diverticulosis, but no active inflammation identified. Negative left colon. Dense retained oral contrast at the splenic flexure and throughout the transverse colon which otherwise is negative. Similar dense retained contrast in the right colon. Negative right colon and appendix. Negative terminal ileum. No dilated small bowel. Decompressed stomach. Negative duodenum. Vascular/Lymphatic: Aortoiliac calcified atherosclerosis noted. Major arterial structures in the abdomen and pelvis are patent. Portal venous system is patent. No lymphadenopathy. Reproductive: Prostate brachytherapy changes. Small fat containing left inguinal hernia is stable. Other: No pelvic free fluid. Musculoskeletal: Degenerative changes in the spine. No acute or suspicious osseous lesion identified. IMPRESSION: 1. IV contrast extravasation occurred along with some hematoma into the patient's left upper extremity as detailed in the Contrast section above. Nonetheless adequate intravenous contrast on the study. 2. Partially visible 3-4 cm mass like opacity in the peripheral lingula with surrounding lingula and left lower lobe tree-in-bud type nodular pulmonary opacity. No associated pleural effusion. Recommend  follow-up PA and lateral chest radiographs, but at this point favor a multilobar pneumonia over left lung bronchogenic carcinoma. Query fever. If infection is suspected clinically then Followup PA and lateral chest X-ray is recommended in 3-4 weeks following trial of antibiotic therapy to ensure resolution and exclude underlying malignancy. 3. No acute or inflammatory process identified in the abdomen or pelvis. Normal appendix. Diverticulosis of the sigmoid colon without active inflammation. 4.  Calcified aortic atherosclerosis. Electronically Signed: By: Genevie Ann M.D. On: 04/14/2016 17:55       Discharge Exam: Vitals:   04/17/16 0801 04/17/16 1130  BP: (!) 169/74 (!) 156/67  Pulse:  60  Resp:  (!) 23  Temp:  97.9 F (36.6 C)   Vitals:   04/17/16 0421 04/17/16 0700 04/17/16 0801 04/17/16 1130  BP: (!) 141/59 (!) 159/75 (!) 169/74 (!) 156/67  Pulse: 60 61  60  Resp: 20 15  (!) 23  Temp: 97.3 F (36.3 C) 98.1 F (36.7 C)  97.9 F (36.6 C)  TempSrc: Axillary Oral  Oral  SpO2: 95%  94% 92%  Weight:  Height:        General: Pt is alert, awake, not in acute distress Cardiovascular: RRR, S1/S2 +, no rubs, no gallops Respiratory: CTA bilaterally, no wheezing, no rhonchi Abdominal: Soft, NT, ND, bowel sounds + Extremities: no edema, no cyanosis    The results of significant diagnostics from this hospitalization (including imaging, microbiology, ancillary and laboratory) are listed below for reference.     Microbiology: Recent Results (from the past 240 hour(s))  Culture, blood (routine x 2) Call MD if unable to obtain prior to antibiotics being given     Status: None (Preliminary result)   Collection Time: 04/14/16 10:01 PM  Result Value Ref Range Status   Specimen Description BLOOD RIGHT HAND  Final   Special Requests IN PEDIATRIC BOTTLE 3CC  Final   Culture NO GROWTH 2 DAYS  Final   Report Status PENDING  Incomplete  Culture, blood (routine x 2) Call MD if unable to  obtain prior to antibiotics being given     Status: None (Preliminary result)   Collection Time: 04/14/16 10:39 PM  Result Value Ref Range Status   Specimen Description BLOOD LEFT ARM  Final   Special Requests BOTTLES DRAWN AEROBIC AND ANAEROBIC 5ML  Final   Culture NO GROWTH 2 DAYS  Final   Report Status PENDING  Incomplete  MRSA PCR Screening     Status: None   Collection Time: 04/15/16 11:28 PM  Result Value Ref Range Status   MRSA by PCR NEGATIVE NEGATIVE Final    Comment:        The GeneXpert MRSA Assay (FDA approved for NASAL specimens only), is one component of a comprehensive MRSA colonization surveillance program. It is not intended to diagnose MRSA infection nor to guide or monitor treatment for MRSA infections.      Labs: BNP (last 3 results) No results for input(s): BNP in the last 8760 hours. Basic Metabolic Panel:  Recent Labs Lab 04/14/16 1207 04/15/16 0818 04/17/16 0622  NA 136 139 141  K 3.4* 3.8 3.3*  CL 100* 110 110  CO2 25 23 23   GLUCOSE 114* 106* 91  BUN 20 19 11   CREATININE 1.32* 1.08 1.02  CALCIUM 8.8* 8.0* 8.3*   Liver Function Tests:  Recent Labs Lab 04/14/16 1633  AST 22  ALT 15*  ALKPHOS 73  BILITOT 0.2*  PROT 6.5  ALBUMIN 3.1*    Recent Labs Lab 04/14/16 1633  LIPASE 29   No results for input(s): AMMONIA in the last 168 hours. CBC:  Recent Labs Lab 04/14/16 1207 04/15/16 0818 04/16/16 0111 04/17/16 0622  WBC 10.3 9.5 8.0 6.6  HGB 15.3 12.9* 11.8* 11.9*  HCT 43.5 37.5* 33.6* 34.5*  MCV 90.4 91.0 89.4 89.8  PLT 277 279 277 324   Cardiac Enzymes:  Recent Labs Lab 04/14/16 2243  CKTOTAL 25*   BNP: Invalid input(s): POCBNP CBG:  Recent Labs Lab 04/14/16 1224  GLUCAP 117*   D-Dimer No results for input(s): DDIMER in the last 72 hours. Hgb A1c No results for input(s): HGBA1C in the last 72 hours. Lipid Profile No results for input(s): CHOL, HDL, LDLCALC, TRIG, CHOLHDL, LDLDIRECT in the last 72  hours. Thyroid function studies No results for input(s): TSH, T4TOTAL, T3FREE, THYROIDAB in the last 72 hours.  Invalid input(s): FREET3 Anemia work up No results for input(s): VITAMINB12, FOLATE, FERRITIN, TIBC, IRON, RETICCTPCT in the last 72 hours. Urinalysis    Component Value Date/Time   COLORURINE YELLOW 04/14/2016 1900   APPEARANCEUR  CLEAR 04/14/2016 1900   APPEARANCEUR Clear 01/07/2014 1435   LABSPEC 1.028 04/14/2016 1900   LABSPEC 1.020 01/07/2014 1435   PHURINE 5.0 04/14/2016 1900   GLUCOSEU NEGATIVE 04/14/2016 1900   GLUCOSEU Negative 01/07/2014 1435   HGBUR NEGATIVE 04/14/2016 1900   BILIRUBINUR NEGATIVE 04/14/2016 1900   BILIRUBINUR Negative 01/07/2014 [place] 04/14/2016 1900   PROTEINUR NEGATIVE 04/14/2016 1900   NITRITE NEGATIVE 04/14/2016 1900   LEUKOCYTESUR NEGATIVE 04/14/2016 1900   LEUKOCYTESUR Negative 01/07/2014 1435   Sepsis Labs Invalid input(s): PROCALCITONIN,  WBC,  LACTICIDVEN Microbiology Recent Results (from the past 240 hour(s))  Culture, blood (routine x 2) Call MD if unable to obtain prior to antibiotics being given     Status: None (Preliminary result)   Collection Time: 04/14/16 10:01 PM  Result Value Ref Range Status   Specimen Description BLOOD RIGHT HAND  Final   Special Requests IN PEDIATRIC BOTTLE 3CC  Final   Culture NO GROWTH 2 DAYS  Final   Report Status PENDING  Incomplete  Culture, blood (routine x 2) Call MD if unable to obtain prior to antibiotics being given     Status: None (Preliminary result)   Collection Time: 04/14/16 10:39 PM  Result Value Ref Range Status   Specimen Description BLOOD LEFT ARM  Final   Special Requests BOTTLES DRAWN AEROBIC AND ANAEROBIC 5ML  Final   Culture NO GROWTH 2 DAYS  Final   Report Status PENDING  Incomplete  MRSA PCR Screening     Status: None   Collection Time: 04/15/16 11:28 PM  Result Value Ref Range Status   MRSA by PCR NEGATIVE NEGATIVE Final    Comment:        The  GeneXpert MRSA Assay (FDA approved for NASAL specimens only), is one component of a comprehensive MRSA colonization surveillance program. It is not intended to diagnose MRSA infection nor to guide or monitor treatment for MRSA infections.      Time coordinating discharge: Over 30 minutes  SIGNED:   Debbe Odea, MD  [facility] 04/17/2016, 3:10 PM Pager   If 7PM-7AM, please contact night-coverage [email or web address] Password [email or web address]

## 2016-04-19 LAB — CULTURE, BLOOD (ROUTINE X 2)
CULTURE: NO GROWTH
Culture: NO GROWTH

## 2016-04-24 ENCOUNTER — Ambulatory Visit (INDEPENDENT_AMBULATORY_CARE_PROVIDER_SITE_OTHER)
Admission: RE | Admit: 2016-04-24 | Discharge: 2016-04-24 | Disposition: A | Discharge status: Home / Self Care | Payer: Medicare Other | Source: Ambulatory Visit | Attending: Acute Care | Admitting: Acute Care

## 2016-04-24 ENCOUNTER — Encounter: Payer: Self-pay | Admitting: Acute Care

## 2016-04-24 ENCOUNTER — Ambulatory Visit (INDEPENDENT_AMBULATORY_CARE_PROVIDER_SITE_OTHER): Payer: Medicare Other | Admitting: Acute Care

## 2016-04-24 VITALS — BP 135/81 | HR 93 | Ht 69.0 in | Wt 185.0 lb

## 2016-04-24 DIAGNOSIS — J9601 Acute respiratory failure with hypoxia: Secondary | ICD-10-CM | POA: Diagnosis not present

## 2016-04-24 DIAGNOSIS — J189 Pneumonia, unspecified organism: Secondary | ICD-10-CM

## 2016-04-24 DIAGNOSIS — J984 Other disorders of lung: Secondary | ICD-10-CM

## 2016-04-24 DIAGNOSIS — I2699 Other pulmonary embolism without acute cor pulmonale: Secondary | ICD-10-CM | POA: Diagnosis not present

## 2016-04-24 MED ORDER — APIXABAN 5 MG PO TABS: 10.0000 mg | ORAL_TABLET | Freq: Two times a day (BID) | ORAL | 7 refills | Status: DC

## 2016-04-24 NOTE — Patient Instructions (Addendum)
It is good to see you today. Continue your Amoxicillin until gone. Continue your Eliquis 5 mg twice daily every day without fail . We will give you a hard copy of the Eliquis RX with 7 refills. Be careful with kitchen prep and shaving. Do not lift anything greater than 10 pounds for another week. No driving for another week. We will check a CXR today. We will call with results. CT scan without contrast in 1 week. Follow up appointment in 2 weeks with me or MD. Please contact office for sooner follow up if symptoms do not improve or worsen or seek emergency care

## 2016-04-24 NOTE — Assessment & Plan Note (Addendum)
Continue your Amoxicillin until gone. We will check a CXR today. We will call with results. CT scan without contrast in 1 week as follow up. Follow up appointment in 2 weeks with me or MD. If unresolved on follow up imaging, consider FOB and biopsy Please contact office for sooner follow up if symptoms do not improve or worsen or seek emergency care

## 2016-04-24 NOTE — Progress Notes (Signed)
History of Present Illness Rickard Wickers Sr. is a 82 y.o. male with medical history significant of hypertension, hyperlipidemia, GERD, prostate cancer (s/pradiation seed), right eyemyasthenia gravis, who presents with generalized weakness, decreased oral intake, nausea. He was seen by CCM for Acute Bilateral PE and Cavitary Lung Mass as an inpatient. He is here for follow up post discharge.   04/24/2016 Hospital Follow Up: Pt  Presents for hospital Follow up of Acute Bilateral PE, and Cavitary Lung Mass.( See below for timeline). He was discharged 04/17/2016, on Eliquis  and Augmentin. He states he has been compliant with his discharge medications.Pt. States his breathing is better. He does not require any supplemental oxygen.Saturations in the office today was 96% on room air. He denies fever, chest pain, orthopnea, or hemoptysis. He is still tired and per his wife is not back to his baseline. He is completing the Amoxicillin, and will finish taking last dose tomorrow  (04/25/2016). He denies cough, denies any sputum. He is compliant with his Eliquis. He has completed  10 mg twice daily dosage on 1/18,, and started the 5 mg twice daily dosage  04/24/16. We will give him prescription refills x 7 months to allow him to get medication from the [place].Marland Kitchen We discussed the importance of not missing a single dose of Eliquis, and also bleeding precautions. We discussed bleeding risk while on blood thinner. I have asked him not to lift anything greater than 10 pounds for another week. We discussed bleeding risks, and care with kitchen prep and shaving. I explained he should go the the ED with any bleeding that does not stop with pressure. We discussed the need for a repeat CT scan to determine etiology of lung mass, which is concerning for lung cancer. ( Pt. Has a history of prostate cancer, S/P TURP. He verbalized understanding of this, and understands why we are scheduling the follow up scan. If repeat scan indicates no  resolution, consider FOB with biopsy as outpatient.Discussed possible need for repeat echo in 6-9 months.  Significant Events:  04/02/2016: Admission to [facility] for acute gastroenteritis with vomiting and diarrhea.  04/03/2016: Discharged from [facility] , continued nausea, poor oral intake, weakness. 10 pound weight loss. 04/14/2016: Admission to [facility] for Acute Respiratory Failure with Hypoxemia: CT 04/14/16:no acute process. Partially visible 3-4 cm masslike opacity in the periphery of lingula. 04/15/2016: [id number] PE, R >L. Cavitary mass versus pneumonia in the LUL/lingula, 4.2 x 2.3 x 3.2cm. ECHO 1/11: EF nml (65-70%); LA mod dilated. NML RV size. NML PAP LE [place] 1/11>>>b/l LE DVT (see report below) which likely occurred during his sedentary period over previous 2-3 wks.  02/15/2017>> Discharge from [facility]   Tests 04/24/2016>> CXR IMPRESSION: Improving left midlung airspace disease likely reflecting resolving pulmonary infarct versus atelectasis.  04/16/2016 Echo Study Conclusions  - Left ventricle: The cavity size was normal. Wall thickness was   increased in a pattern of moderate LVH. Systolic function was   vigorous. The estimated ejection fraction was in the range of 65%   to 70%. Wall motion was normal; there were no regional wall   motion abnormalities. - Left atrium: The atrium was mildly to moderately dilated.  Past medical hx Past Medical History:  Diagnosis Date  . Dyslipidemia   . GERD (gastroesophageal reflux disease)   . Glaucoma   . Hyperlipidemia   . Hypertension   . Insomnia   . Myasthenia gravis ([place]) 09/08/2012  . Obesity   . Ocular myasthenia gravis (  [place])   . Prostate cancer [facility])      Past surgical hx, Family hx, Social hx all reviewed.  Current Outpatient Prescriptions on File Prior to Visit  Medication Sig  . acetaminophen (TYLENOL) 325 MG tablet Take 325-650 mg by mouth every 6 (six) hours as needed for headache  (or pain).  Marland Kitchen amoxicillin-clavulanate (AUGMENTIN) 875-125 MG tablet Take 1 tablet by mouth 2 (two) times daily.  Marland Kitchen apixaban (ELIQUIS) 5 MG TABS tablet Take 1 tablet (5 mg total) by mouth 2 (two) times daily.  Marland Kitchen atorvastatin (LIPITOR) 20 MG tablet Take 20 mg by mouth at bedtime.  . Cholecalciferol (VITAMIN D-3) 1000 UNITS CAPS Take 1,000 Units by mouth daily.  Marland Kitchen latanoprost (XALATAN) 0.005 % ophthalmic solution Place 1 drop into both eyes at bedtime.  Marland Kitchen lisinopril (PRINIVIL,ZESTRIL) 20 MG tablet Take 20 mg by mouth every morning.  . Multiple Vitamins-Minerals (ONE-A-DAY MENS 50+ ADVANTAGE) TABS Take 1 tablet by mouth every morning.  Marland Kitchen omeprazole (PRILOSEC) 20 MG capsule Take 2 capsules (40 mg total) by mouth 2 (two) times daily before a meal.  . ondansetron (ZOFRAN ODT) 4 MG disintegrating tablet Take 1 tablet (4 mg total) by mouth every 8 (eight) hours as needed for nausea or vomiting.  . predniSONE (DELTASONE) 5 MG tablet Take 5 mg by mouth daily.  . sucralfate (CARAFATE) 1 GM/10ML suspension Take 10 mLs by mouth at bedtime.   No current facility-administered medications on file prior to visit.      No Known Allergies  Review Of Systems:  Constitutional:   +  weight loss, no night sweats,  Fevers, chills, +fatigue, or  lassitude.  HEENT:   No headaches,  Difficulty swallowing,  Tooth/dental problems, or  Sore throat,                No sneezing, itching, ear ache, nasal congestion, post nasal drip,   CV:  No chest pain,  Orthopnea, PND, swelling in lower extremities, anasarca, dizziness, palpitations, syncope.   GI  No heartburn, indigestion, abdominal pain, nausea, vomiting, diarrhea, change in bowel habits, +loss of appetite,no  bloody stools.   Resp: Minimal  shortness of breath with exertion none at rest.  No excess mucus, no productive cough,  No non-productive cough,  No coughing up of blood.  No change in color of mucus.  No wheezing.  No chest wall deformity  Skin: no rash or  lesions.  GU: no dysuria, change in color of urine, no urgency or frequency.  No flank pain, no hematuria   MS:  No joint pain or swelling.  No decreased range of motion.  No back pain.  Psych:  No change in mood or affect. No depression or anxiety.  No memory loss.   Vital Signs BP 135/81 (BP Location: Right Arm, Cuff Size: Normal)   Pulse 93   Ht 5\' 9"  (1.753 m)   Wt 185 lb (83.9 kg)   SpO2 96%   BMI 27.32 kg/m   Body mass index is 27.32 kg/m.  Physical Exam:  General- No distress,  A&Ox3, pleasant ENT: No sinus tenderness, TM clear, pale nasal mucosa, no oral exudate,no post nasal drip, no LAN Cardiac: S1, S2, regular rate and rhythm, no murmur Chest: No wheeze/ rales/ dullness; no accessory muscle use, no nasal flaring, no sternal retractions Abd.: Soft Non-tender, obese Ext: No clubbing cyanosis, edema Neuro:  Improving strength, deconditioned with two close interval hospital admissions. Skin: No rashes, warm and dry Psych: normal mood and  behavior, pleasant   Assessment/Plan  Cavitating mass of lung Continue your Amoxicillin until gone. We will check a CXR today. We will call with results. CT scan without contrast in 1 week as follow up. Follow up appointment in 2 weeks with me or MD. If unresolved on follow up imaging, consider FOB and biopsy Please contact office for sooner follow up if symptoms do not improve or worsen or seek emergency care     Pulmonary emboli ([place]) 04/15/2016: [id number] PE, R >L. Cavitary mass versus pneumonia in the LUL/lingula, 4.2 x 2.3 x 3.2cm. ECHO 1/11: EF nml (65-70%); LA mod dilated. NML RV size. NML PAP LE [place] 1/11>>>b/l LE DVT (see report below) which likely occurred during his sedentary period over previous 2-3 wks.  Plan: Continue your Eliquis 5 mg twice daily every day without fail . We will give you a hard copy of the Eliquis RX with 7 refills. Be careful with kitchen prep and shaving. Do not lift anything greater  than 10 pounds for another week. No driving for another week. We will check a CXR today. We will call with results. CT scan without contrast in 1 week. Follow up appointment in 2 weeks with me or MD. Please contact office for sooner follow up if symptoms do not improve or worsen or seek emergency care     Acute respiratory failure with hypoxia ([place]) Secondary to Cavitary Mass vs. PE vs HAP Resolved Saturation 96 % on room Air in Office today  HAP Continue your Amoxicillin until gone. CXR today CT Chest without contrast 1-2 weeks Follow up appointment in 2 weeks Please contact office for sooner follow up if symptoms do not improve or worsen or seek emergency care   Magdalen Spatz, NP 04/24/2016  7:08 PM

## 2016-04-24 NOTE — Assessment & Plan Note (Deleted)
Continue Amoxicillin until gone CXR today Follow up CT in 1 week Follow up appointment in 2 weeks Please contact office for sooner follow up if symptoms do not improve or worsen or seek emergency care

## 2016-04-24 NOTE — Assessment & Plan Note (Signed)
Secondary to Cavitary Mass vs. PE vs HAP Resolved Saturation 96 % on room Air in Office today

## 2016-04-24 NOTE — Assessment & Plan Note (Addendum)
04/15/2016: [id number] PE, R >L. Cavitary mass versus pneumonia in the LUL/lingula, 4.2 x 2.3 x 3.2cm. ECHO 1/11: EF nml (65-70%); LA mod dilated. NML RV size. NML PAP LE [place] 1/11>>>b/l LE DVT (see report below) which likely occurred during his sedentary period over previous 2-3 wks.  Plan: Continue your Eliquis 5 mg twice daily every day without fail . We will give you a hard copy of the Eliquis RX with 7 refills. Be careful with kitchen prep and shaving. Do not lift anything greater than 10 pounds for another week. No driving for another week. We will check a CXR today. We will call with results. CT scan without contrast in 1 week. Follow up appointment in 2 weeks with me or MD. Please contact office for sooner follow up if symptoms do not improve or worsen or seek emergency care

## 2016-04-25 NOTE — Progress Notes (Signed)
Chart and office note reviewed in detail  > agree with a/p as outlined    

## 2016-04-30 ENCOUNTER — Inpatient Hospital Stay: Payer: Medicare Other | Admitting: Adult Health

## 2016-05-01 ENCOUNTER — Ambulatory Visit
Admission: RE | Admit: 2016-05-01 | Discharge: 2016-05-01 | Disposition: A | Discharge status: Home / Self Care | Payer: Medicare Other | Source: Ambulatory Visit | Attending: Acute Care | Admitting: Acute Care

## 2016-05-01 DIAGNOSIS — J984 Other disorders of lung: Secondary | ICD-10-CM | POA: Insufficient documentation

## 2016-05-01 DIAGNOSIS — I700 Atherosclerosis of aorta: Secondary | ICD-10-CM | POA: Insufficient documentation

## 2016-05-01 DIAGNOSIS — I2510 Atherosclerotic heart disease of native coronary artery without angina pectoris: Secondary | ICD-10-CM | POA: Diagnosis not present

## 2016-05-14 ENCOUNTER — Encounter: Payer: Self-pay | Admitting: Pulmonary Disease

## 2016-05-14 ENCOUNTER — Ambulatory Visit (INDEPENDENT_AMBULATORY_CARE_PROVIDER_SITE_OTHER): Payer: Medicare Other | Admitting: Pulmonary Disease

## 2016-05-14 VITALS — BP 132/68 | HR 73 | Ht 69.0 in | Wt 193.4 lb

## 2016-05-14 DIAGNOSIS — J189 Pneumonia, unspecified organism: Secondary | ICD-10-CM | POA: Diagnosis not present

## 2016-05-14 DIAGNOSIS — J984 Other disorders of lung: Secondary | ICD-10-CM

## 2016-05-14 NOTE — Patient Instructions (Signed)
We will order a follow-up CT of the chest without contrast in 3 months  Return to clinic after CT scan for review and further workup as needed.

## 2016-05-14 NOTE — Progress Notes (Signed)
Elijah Cayea Sr.    [id number]    09/27/1934  Primary [facility]  Referring Physician: [facility] [address] [place], [place] [address]  Chief complaint:  Follow-up for abnormal CT scan  HPI: 82 year old with past medical history of hypertension, hyperlipidemia, GERD, prostate cancer. He was admitted in December for acute gastroenteritis, diarrhea. He had a readmission in early January 2018 with dyspnea, hypoxia. He had a CT which showed bilateral PE and a cavitary lesions in bilateral lungs. He was started on anticoagulation and given antibiotics for a total of 2 weeks. He returns to clinic after a follow up CT scan.  Denies any cough, sputum production, fevers, chills. He denies any dyspnea, wheezing, chest pain, palpitations.   Outpatient Encounter Prescriptions as of 05/14/2016  Medication Sig  . acetaminophen (TYLENOL) 325 MG tablet Take 325-650 mg by mouth every 6 (six) hours as needed for headache (or pain).  Marland Kitchen apixaban (ELIQUIS) 5 MG TABS tablet Take 2 tablets (10 mg total) by mouth 2 (two) times daily.  Marland Kitchen atorvastatin (LIPITOR) 20 MG tablet Take 20 mg by mouth at bedtime.  . Cholecalciferol (VITAMIN D-3) 1000 UNITS CAPS Take 1,000 Units by mouth daily.  Marland Kitchen latanoprost (XALATAN) 0.005 % ophthalmic solution Place 1 drop into both eyes at bedtime.  Marland Kitchen lisinopril (PRINIVIL,ZESTRIL) 20 MG tablet Take 20 mg by mouth every morning.  . Multiple Vitamins-Minerals (ONE-A-DAY MENS 50+ ADVANTAGE) TABS Take 1 tablet by mouth every morning.  Marland Kitchen omeprazole (PRILOSEC) 20 MG capsule Take 2 capsules (40 mg total) by mouth 2 (two) times daily before a meal.  . ondansetron (ZOFRAN ODT) 4 MG disintegrating tablet Take 1 tablet (4 mg total) by mouth every 8 (eight) hours as needed for nausea or vomiting.  . predniSONE (DELTASONE) 5 MG tablet Take 5 mg by mouth daily.  . sucralfate (CARAFATE) 1 GM/10ML suspension Take 10 mLs by mouth  at bedtime.  . [DISCONTINUED] amoxicillin-clavulanate (AUGMENTIN) 875-125 MG tablet Take 1 tablet by mouth 2 (two) times daily.  . [DISCONTINUED] apixaban (ELIQUIS) 5 MG TABS tablet Take 1 tablet (5 mg total) by mouth 2 (two) times daily.   No facility-administered encounter medications on file as of 05/14/2016.     Allergies as of 05/14/2016  . (No Known Allergies)    Past Medical History:  Diagnosis Date  . Dyslipidemia   . GERD (gastroesophageal reflux disease)   . Glaucoma   . Hyperlipidemia   . Hypertension   . Insomnia   . Myasthenia gravis ([place]) 09/08/2012  . Obesity   . Ocular myasthenia gravis ([place])   . Prostate cancer [facility])     Past Surgical History:  Procedure Laterality Date  . CATARACT EXTRACTION Bilateral   . CHOLECYSTECTOMY    . TRANSURETHRAL RESECTION OF PROSTATE      Family History  Problem Relation Age of Onset  . Heart attack Father     Social History   Social History  . Marital status: Married    Spouse name: Crystal  . Number of children: 3  . Years of education: 16   Occupational History  .      retired   Social History Main Topics  . Smoking status: Former Smoker    Quit date: 04/06/1970  . Smokeless tobacco: Never Used  . Alcohol use 0.0 oz/week     Comment: Consumes alcohol on occasion  . Drug use: No  . Sexual activity: Not on file  Other Topics Concern  . Not on file   Social History Narrative   Patient lives at home with his wife Veterinary surgeon)   Retired - [employer]   [place]   Right handed.   Caffeine- four cups daily.             Review of systems: Review of Systems  Constitutional: Negative for fever and chills.  HENT: Negative.   Eyes: Negative for blurred vision.  Respiratory: as per HPI  Cardiovascular: Negative for chest pain and palpitations.  Gastrointestinal: Negative for vomiting, diarrhea, blood per rectum. Genitourinary: Negative for dysuria, urgency, frequency and hematuria.  Musculoskeletal:  Negative for myalgias, back pain and joint pain.  Skin: Negative for itching and rash.  Neurological: Negative for dizziness, tremors, focal weakness, seizures and loss of consciousness.  Endo/Heme/Allergies: Negative for environmental allergies.  Psychiatric/Behavioral: Negative for depression, suicidal ideas and hallucinations.  All other systems reviewed and are negative.  Physical Exam: Blood pressure 132/68, pulse 73, height 5\' 9"  (1.753 m), weight 87.7 kg (193 lb 6.4 oz), SpO2 95 %. Gen:      No acute distress HEENT:  EOMI, sclera anicteric Neck:     No masses; no thyromegaly Lungs:    Clear to auscultation bilaterally; normal respiratory effort CV:         Regular rate and rhythm; no murmurs Abd:      + bowel sounds; soft, non-tender; no palpable masses, no distension Ext:    No edema; adequate peripheral perfusion Skin:      Warm and dry; no rash Neuro: alert and oriented x 3 Psych: normal mood and affect  Data Reviewed: CT scan 04/15/16- bilateral PE, saddle embolus in the right PA. Cavitation in the left upper lobe, rt upper lobe consolidation CT scan 05/01/16- decreased size of left upper lobe cavitary lesion. decrease in size of right upper lobe opacity Images reviewed  Echo 04/16/16-EF 65-70 percent, normal LV size, normal PA pressure Lower extremity Doppler 04/16/16-bilateral lower extremity DVT  Assessment:  Cavitary lung lesions This is likely infectious in etiology. His latest CT scan shows improvement in these lesions. We need to follow this to resolution. I'll repeat a CT scan in 3 months for further evaluation. I don't believe he needs more antibiotics as he does not have any symptoms right now  Bilateral pulmonary embolus Likely secondary to immobility after his admission with gastroenteritis in late December. He'll need anticoagulantion for least 6 months. Consider indefinite anticoagulation.  Plan/Recommendations: - CT scan chest without contrast in 3 months -  Continue anticoagulation for PE  Marshell Garfinkel MD [facility] Pager [phone] 05/14/2016, 2:08 PM  CC: [facility]

## 2016-05-19 ENCOUNTER — Telehealth: Payer: Self-pay | Admitting: Pulmonary Disease

## 2016-05-19 NOTE — Telephone Encounter (Signed)
Lmtcb X1 for pt.

## 2016-05-19 NOTE — Telephone Encounter (Signed)
The number provided for Dr. Dione Plover office rang for over a minute with no option leave vm. Will await pt's call.

## 2016-05-20 NOTE — Telephone Encounter (Signed)
lmom tcb x2 to pt.   Contacted the number was given and the fax number for Dr. Hortencia Conradi at [facility]. ( fax [phone]) Office notes were faxed

## 2016-07-30 ENCOUNTER — Ambulatory Visit
Admission: RE | Admit: 2016-07-30 | Discharge: 2016-07-30 | Disposition: A | Discharge status: Home / Self Care | Payer: Medicare Other | Source: Ambulatory Visit | Attending: Pulmonary Disease | Admitting: Pulmonary Disease

## 2016-07-30 DIAGNOSIS — I2510 Atherosclerotic heart disease of native coronary artery without angina pectoris: Secondary | ICD-10-CM | POA: Insufficient documentation

## 2016-07-30 DIAGNOSIS — I700 Atherosclerosis of aorta: Secondary | ICD-10-CM | POA: Diagnosis not present

## 2016-07-30 DIAGNOSIS — R918 Other nonspecific abnormal finding of lung field: Secondary | ICD-10-CM | POA: Diagnosis present

## 2016-07-30 DIAGNOSIS — J984 Other disorders of lung: Secondary | ICD-10-CM

## 2016-08-24 ENCOUNTER — Encounter: Payer: Self-pay | Admitting: Pulmonary Disease

## 2016-08-24 ENCOUNTER — Ambulatory Visit (INDEPENDENT_AMBULATORY_CARE_PROVIDER_SITE_OTHER): Payer: Medicare Other | Admitting: Pulmonary Disease

## 2016-08-24 VITALS — BP 126/68 | HR 82 | Ht 69.5 in | Wt 199.6 lb

## 2016-08-24 DIAGNOSIS — J984 Other disorders of lung: Secondary | ICD-10-CM

## 2016-08-24 DIAGNOSIS — I2782 Chronic pulmonary embolism: Secondary | ICD-10-CM | POA: Diagnosis not present

## 2016-08-24 NOTE — Addendum Note (Signed)
Addended by: Jannette Spanner on: 08/24/2016 03:04 PM   Modules accepted: Orders

## 2016-08-24 NOTE — Patient Instructions (Signed)
We will schedule for lower extremity ultrasound of the legs to look for DVT in 2 months I'll in clinic after ultrasound

## 2016-08-24 NOTE — Progress Notes (Signed)
Elijah Boozer Sr.    [id number]    1934-06-09  Primary Care Physician:[facility]  Referring Physician: [facility] [address] [facility] [place], [place] [address]  Chief complaint:  Follow-up for abnormal CT scan  HPI: 82 year old with past medical history of hypertension, hyperlipidemia, GERD, prostate cancer. He was admitted in December 2017 for acute gastroenteritis, diarrhea. After discharge he was immobile at home for a couple of weeks due to weakness. He then had a readmission in early January 2018 with dyspnea, hypoxia. He had a CT which showed bilateral PE and a cavitary lesions in bilateral lungs. He was started on anticoagulation and given antibiotics for a total of 2 weeks. He returns to clinic after a follow up CT scan.  Denies any cough, sputum production, fevers, chills. He denies any dyspnea, wheezing, chest pain, palpitations.   Outpatient Encounter Prescriptions as of 08/24/2016  Medication Sig  . acetaminophen (TYLENOL) 325 MG tablet Take 325-650 mg by mouth every 6 (six) hours as needed for headache (or pain).  Marland Kitchen apixaban (ELIQUIS) 5 MG TABS tablet Take 2 tablets (10 mg total) by mouth 2 (two) times daily.  Marland Kitchen atorvastatin (LIPITOR) 20 MG tablet Take 20 mg by mouth at bedtime.  . Cholecalciferol (VITAMIN D-3) 1000 UNITS CAPS Take 1,000 Units by mouth daily.  Marland Kitchen latanoprost (XALATAN) 0.005 % ophthalmic solution Place 1 drop into both eyes at bedtime.  Marland Kitchen lisinopril (PRINIVIL,ZESTRIL) 20 MG tablet Take 20 mg by mouth every morning.  . Multiple Vitamins-Minerals (ONE-A-DAY MENS 50+ ADVANTAGE) TABS Take 1 tablet by mouth every morning.  Marland Kitchen omeprazole (PRILOSEC) 20 MG capsule Take 2 capsules (40 mg total) by mouth 2 (two) times daily before a meal.  . ondansetron (ZOFRAN ODT) 4 MG disintegrating tablet Take 1 tablet (4 mg total) by mouth every 8 (eight) hours as needed for nausea or vomiting.  . predniSONE (DELTASONE) 5 MG tablet  Take 5 mg by mouth daily.  . sucralfate (CARAFATE) 1 GM/10ML suspension Take 10 mLs by mouth at bedtime.   No facility-administered encounter medications on file as of 08/24/2016.     Allergies as of 08/24/2016  . (No Known Allergies)    Past Medical History:  Diagnosis Date  . Dyslipidemia   . GERD (gastroesophageal reflux disease)   . Glaucoma   . Hyperlipidemia   . Hypertension   . Insomnia   . Myasthenia gravis ([place]) 09/08/2012  . Obesity   . Ocular myasthenia gravis ([place])   . Prostate cancer [facility])     Past Surgical History:  Procedure Laterality Date  . CATARACT EXTRACTION Bilateral   . CHOLECYSTECTOMY    . TRANSURETHRAL RESECTION OF PROSTATE      Family History  Problem Relation Age of Onset  . Heart attack Father     Social History   Social History  . Marital status: Married    Spouse name: Crystal  . Number of children: 3  . Years of education: 16   Occupational History  .      retired   Social History Main Topics  . Smoking status: Former Smoker    Quit date: 04/06/1970  . Smokeless tobacco: Never Used  . Alcohol use 0.0 oz/week     Comment: Consumes alcohol on occasion  . Drug use: No  . Sexual activity: Not on file   Other Topics Concern  . Not on file   Social History Narrative   Patient lives at home  with his wife Veterinary surgeon)   Retired - [employer]   [place]   Right handed.   Caffeine- four cups daily.             Review of systems: Review of Systems  Constitutional: Negative for fever and chills.  HENT: Negative.   Eyes: Negative for blurred vision.  Respiratory: as per HPI  Cardiovascular: Negative for chest pain and palpitations.  Gastrointestinal: Negative for vomiting, diarrhea, blood per rectum. Genitourinary: Negative for dysuria, urgency, frequency and hematuria.  Musculoskeletal: Negative for myalgias, back pain and joint pain.  Skin: Negative for itching and rash.  Neurological: Negative for dizziness, tremors,  focal weakness, seizures and loss of consciousness.  Endo/Heme/Allergies: Negative for environmental allergies.  Psychiatric/Behavioral: Negative for depression, suicidal ideas and hallucinations.  All other systems reviewed and are negative.  Physical Exam: Blood pressure 126/68, pulse 82, height 5' 9.5" (1.765 m), weight 199 lb 9.6 oz (90.5 kg), SpO2 95 %. Gen:      No acute distress HEENT:  EOMI, sclera anicteric Neck:     No masses; no thyromegaly Lungs:    Clear to auscultation bilaterally; normal respiratory effort CV:         Regular rate and rhythm; no murmurs Abd:      + bowel sounds; soft, non-tender; no palpable masses, no distension Ext:    No edema; adequate peripheral perfusion Skin:      Warm and dry; no rash Neuro: alert and oriented x 3 Psych: normal mood and affect  Data Reviewed: CT scan 04/15/16- bilateral PE, saddle embolus in the right PA. Cavitation in the left upper lobe, rt upper lobe consolidation CT scan 05/01/16- decreased size of left upper lobe cavitary lesion. decrease in size of right upper lobe opacity CT scan 07/30/16- near-complete resolution of right upper lobe opacity and left upper lobe cavitary lesion with small residual scarring, Images reviewed  Echo 04/16/16-EF 65-70 percent, normal LV size, normal PA pressure Lower extremity Doppler 04/16/16-bilateral lower extremity DVT  Assessment:  Cavitary lung lesions This is likely infectious in etiology. His latest CT scan shows resolution in these lesions with residual scarring. I do believe he'll need a follow-up CT scan. He has some coronary artery calcifications and will follow up with his primary care at the [facility] for further evaluation  Bilateral pulmonary embolus Likely secondary to immobility after his admission with gastroenteritis in late December. He'll  need anticoagulantion for least 6 months. We will repeat an ultrasound of the lower extremities in a couple of months and determine if he can come  off the anticoagulation  Plan/Recommendations: - Lower extremity ultrasound in 2 months to evaluate for DVT. - Continue anticoagulation for PE  Marshell Garfinkel MD [place] [facility] Pager [phone] 08/24/2016, 2:40 PM  CC: [facility]

## 2016-09-07 ENCOUNTER — Encounter: Payer: Self-pay | Admitting: Family Medicine

## 2016-09-07 ENCOUNTER — Ambulatory Visit (INDEPENDENT_AMBULATORY_CARE_PROVIDER_SITE_OTHER): Payer: Self-pay | Admitting: Family Medicine

## 2016-09-07 VITALS — BP 139/71 | HR 62 | Temp 98.2°F | Ht 69.0 in | Wt 198.8 lb

## 2016-09-07 DIAGNOSIS — IMO0002 Reserved for concepts with insufficient information to code with codable children: Secondary | ICD-10-CM

## 2016-09-07 DIAGNOSIS — E782 Mixed hyperlipidemia: Secondary | ICD-10-CM | POA: Diagnosis not present

## 2016-09-07 DIAGNOSIS — I10 Essential (primary) hypertension: Secondary | ICD-10-CM

## 2016-09-07 DIAGNOSIS — G7000 Myasthenia gravis without (acute) exacerbation: Secondary | ICD-10-CM | POA: Diagnosis not present

## 2016-09-07 DIAGNOSIS — Z7689 Persons encountering health services in other specified circumstances: Secondary | ICD-10-CM

## 2016-09-07 DIAGNOSIS — I2699 Other pulmonary embolism without acute cor pulmonale: Secondary | ICD-10-CM | POA: Diagnosis not present

## 2016-09-07 DIAGNOSIS — C4492 Squamous cell carcinoma of skin, unspecified: Secondary | ICD-10-CM | POA: Diagnosis not present

## 2016-09-07 NOTE — Patient Instructions (Addendum)
Thank you for coming to the clinic today.  1. Continue with your current treatment plan, no changes today  2. Continue with Dermatology for topical treatment for skin cancer  Stay [facility] including Pneumonia Vaccine  Please schedule a Follow-up Appointment to: Return in about 6 months (around 03/09/2017) for HTN, HLD, Back Pain.  If you have any other questions or concerns, please feel free to call the clinic or send a message through [place]. You may also schedule an earlier appointment if necessary.  Nobie Putnam, DO [place]

## 2016-09-07 NOTE — Progress Notes (Signed)
Subjective:    Patient ID: Elijah Ponder Sr., male    DOB: 03-26-1935, 82 y.o.   MRN: [id number]  Elijah Blew Sr. is a 82 y.o. male presenting on 09/07/2016 for [place]  Former [employer], had been followed by [employer] [facility]). He had previously seen by other Civilian PCP in past, but has requested change from prior PCP. Now here to establish.  HPI   CHRONIC HTN: Reports no new concerns, BP stable when checked at home 130s/70s Current Meds - Lisinopril 20mg  daily   Reports good compliance, took meds today. Tolerating well, w/o complaints. Denies CP, dyspnea, HA, edema, dizziness / lightheadedness  Myasthina Gravis: - Reports initial dx approx 6 years ago, was on Prednisone 10mg  daily then gradually decreased to half tab, then considered to be able to take off of prednisone, he had significant weakness in legs with flare, then advised to resume chronic prednisone. Now taking Prednisone 5mg  daily.  History of Elevated Glucose - Prior glucose >100 in past, no recent A1c previously, no known dx Pre-DM  HYPERLIPIDEMIA: - Reports no concerns. Last lipid panel reportedly abnormal without results available - Currently taking Atorvastatin 20mg , tolerating well without side effects or myalgias  Skin Cancer (Invasive Squamous Cell Carcinoma) - Reports prior history of skin cancer forehead, face, and hands. Face had resolved in past 1 year with topical treatment, does not think he had melanoma. Admits excessive sun exposure most days, wears ago - [facility] - Using topical Fluoruacil 5% cream  Additional history with prior HCAP 03/2016 to 04/2016, with secondary PE, has been on anticoagulation for 6 months with eliquis now - Also Left low back pain with sciatica > uses ice packs PRN    Past Medical History:  Diagnosis Date  . Dyslipidemia   . GERD (gastroesophageal reflux disease)   . Glaucoma   . Hyperlipidemia   . Hypertension   . Insomnia   . Myasthenia  gravis ([place]) 09/08/2012  . Obesity   . Ocular myasthenia gravis ([place])   . Prostate cancer [facility])    Past Surgical History:  Procedure Laterality Date  . CATARACT EXTRACTION Bilateral   . CHOLECYSTECTOMY    . TRANSURETHRAL RESECTION OF PROSTATE     Social History   Social History  . Marital status: Married    Spouse name: Crystal  . Number of children: 3  . Years of education: 16   Occupational History  .      retired   Social History Main Topics  . Smoking status: Former Smoker    Quit date: 04/06/1970  . Smokeless tobacco: Never Used  . Alcohol use 0.0 oz/week     Comment: Consumes alcohol on occasion  . Drug use: No  . Sexual activity: Not on file   Other Topics Concern  . Not on file   Social History Narrative   Patient lives at home with his wife Veterinary surgeon)   Retired - [employer]   [employer]   Right handed.   Caffeine- four cups daily.            Family History  Problem Relation Age of Onset  . Heart attack Father    Current Outpatient Prescriptions on File Prior to Visit  Medication Sig  . acetaminophen (TYLENOL) 325 MG tablet Take 325-650 mg by mouth every 6 (six) hours as needed for headache (or pain).  Marland Kitchen apixaban (ELIQUIS) 5 MG TABS tablet Take 2 tablets (10 mg total) by mouth 2 (two) times daily.  Marland Kitchen  atorvastatin (LIPITOR) 20 MG tablet Take 20 mg by mouth at bedtime.  Marland Kitchen latanoprost (XALATAN) 0.005 % ophthalmic solution Place 1 drop into both eyes at bedtime.  Marland Kitchen lisinopril (PRINIVIL,ZESTRIL) 20 MG tablet Take 20 mg by mouth every morning.  . Multiple Vitamins-Minerals (ONE-A-DAY MENS 50+ ADVANTAGE) TABS Take 1 tablet by mouth every morning.  Marland Kitchen omeprazole (PRILOSEC) 20 MG capsule Take 2 capsules (40 mg total) by mouth 2 (two) times daily before a meal.  . ondansetron (ZOFRAN ODT) 4 MG disintegrating tablet Take 1 tablet (4 mg total) by mouth every 8 (eight) hours as needed for nausea or vomiting.  . predniSONE (DELTASONE) 5 MG tablet Take 5 mg by mouth  daily.  . Cholecalciferol (VITAMIN D-3) 1000 UNITS CAPS Take 1,000 Units by mouth daily.  . sucralfate (CARAFATE) 1 GM/10ML suspension Take 10 mLs by mouth at bedtime.   No current facility-administered medications on file prior to visit.     Review of Systems  Constitutional: Negative for activity change, appetite change, chills, diaphoresis, fatigue and fever.  HENT: Negative for congestion, hearing loss and sinus pressure.   Eyes: Negative for visual disturbance.  Respiratory: Negative for apnea, cough, chest tightness, shortness of breath and wheezing.   Cardiovascular: Negative for chest pain, palpitations and leg swelling.  Gastrointestinal: Negative for abdominal pain, constipation, diarrhea, nausea and vomiting.  Endocrine: Negative for cold intolerance and polyuria.  Genitourinary: Negative for decreased urine volume, difficulty urinating, dysuria, frequency and hematuria.  Musculoskeletal: Negative for arthralgias and neck pain.  Skin: Positive for rash (forehead).  Allergic/Immunologic: Negative for environmental allergies.  Neurological: Negative for dizziness, weakness, light-headedness, numbness and headaches.  Hematological: Negative for adenopathy.  Psychiatric/Behavioral: Negative for behavioral problems, decreased concentration, dysphoric mood and sleep disturbance. The patient is not nervous/anxious.    Per HPI unless specifically indicated above     Objective:    BP 139/71   Pulse 62   Temp 98.2 F (36.8 C) (Oral)   Ht 5\' 9"  (1.753 m)   Wt 198 lb 12.8 oz (90.2 kg)   SpO2 96%   BMI 29.36 kg/m   Wt Readings from Last 3 Encounters:  09/07/16 198 lb 12.8 oz (90.2 kg)  08/24/16 199 lb 9.6 oz (90.5 kg)  05/14/16 193 lb 6.4 oz (87.7 kg)    Physical Exam  Constitutional: He is oriented to person, place, and time. He appears well-developed and well-nourished. No distress.  Well-appearing, comfortable, cooperative  HENT:  Head: Normocephalic and atraumatic.    Mouth/Throat: Oropharynx is clear and moist.  Eyes: Conjunctivae and EOM are normal. Pupils are equal, round, and reactive to light.  Neck: Normal range of motion. Neck supple. No thyromegaly present.  No carotid bruits  Cardiovascular: Normal rate, regular rhythm and intact distal pulses.   Murmur (Mild 1/6 R sternal border without radiation) heard. Pulmonary/Chest: Effort normal and breath sounds normal. No respiratory distress. He has no wheezes. He has no rales.  Good air movement  Abdominal: Soft. Bowel sounds are normal. He exhibits no distension.  Musculoskeletal: Normal range of motion. He exhibits no edema or tenderness.  Upper / Lower Extremities: - Normal muscle tone, strength bilateral upper extremities 5/5, lower extremities 5/5  - Normal Gait  Lymphadenopathy:    He has no cervical adenopathy.  Neurological: He is alert and oriented to person, place, and time.  Skin: Skin is warm and dry. He is not diaphoretic.  Forehead: erythematous appearance s/p topical chemotherapy for skin cancer.  Left hand, dorsal.  Chronic non healing skin lesion. Not open ulceration  Psychiatric: He has a normal mood and affect. His behavior is normal.  Well groomed, good eye contact, normal speech and thoughts  Nursing note and vitals reviewed.    I have personally reviewed the radiology report from 07/30/16 on Chest CT without contrast.  CLINICAL DATA:  Follow-up lung nodule  EXAM: CT CHEST WITHOUT CONTRAST  TECHNIQUE: Multidetector CT imaging of the chest was performed following the standard protocol without IV contrast.  COMPARISON:  05/01/2016  FINDINGS: Cardiovascular: Scattered aortic calcifications. Extensive coronary artery calcifications. Heart is normal size. Aorta is normal caliber.  Mediastinum/Nodes: No mediastinal, hilar, or axillary adenopathy. Trachea and esophagus are unremarkable.  Lungs/Pleura: Previously seen ground-glass opacity in the posterior right  upper lobe now corresponds to an area of pleural thickening with out underlying pulmonary nodule. Previously seen left upper lobe density now represents linear scarring, improved since prior study as well. No new pulmonary nodules. Scarring in the lung bases. No pleural effusions.  Upper Abdomen: Imaging into the upper abdomen shows no acute findings.  Musculoskeletal: Chest wall soft tissues are unremarkable. No acute bony abnormality.  IMPRESSION: Resolution of previously seen ground-glass nodular area in the posterior right upper lobe, now with area of residual pleural thickening/ scarring. Improvement in the left upper lobe area consolidation with residual scarring.  Coronary artery disease, aortic atherosclerosis.   Electronically Signed   By: Rolm Baptise M.D.   On: 07/30/2016 08:18  Results for orders placed or performed during the hospital encounter of 04/14/16  Culture, blood (routine x 2) Call MD if unable to obtain prior to antibiotics being given  Result Value Ref Range   Specimen Description BLOOD LEFT ARM    Special Requests BOTTLES DRAWN AEROBIC AND ANAEROBIC 5ML    Culture NO GROWTH 5 DAYS    Report Status 04/19/2016 FINAL   Culture, blood (routine x 2) Call MD if unable to obtain prior to antibiotics being given  Result Value Ref Range   Specimen Description BLOOD RIGHT HAND    Special Requests IN PEDIATRIC BOTTLE 3CC    Culture NO GROWTH 5 DAYS    Report Status 04/19/2016 FINAL   MRSA PCR Screening  Result Value Ref Range   MRSA by PCR NEGATIVE NEGATIVE  Basic metabolic panel  Result Value Ref Range   Sodium 136 135 - 145 mmol/L   Potassium 3.4 (L) 3.5 - 5.1 mmol/L   Chloride 100 (L) 101 - 111 mmol/L   CO2 25 22 - 32 mmol/L   Glucose, Bld 114 (H) 65 - 99 mg/dL   BUN 20 6 - 20 mg/dL   Creatinine, Ser 1.32 (H) 0.61 - 1.24 mg/dL   Calcium 8.8 (L) 8.9 - 10.3 mg/dL   GFR calc non Af Amer 50 (L) >60 mL/min   GFR calc Af Amer 58 (L) >60 mL/min    Anion gap 11 5 - 15  CBC  Result Value Ref Range   WBC 10.3 4.0 - 10.5 K/uL   RBC 4.81 4.22 - 5.81 MIL/uL   Hemoglobin 15.3 13.0 - 17.0 g/dL   HCT 43.5 39.0 - 52.0 %   MCV 90.4 78.0 - 100.0 fL   MCH 31.8 26.0 - 34.0 pg   MCHC 35.2 30.0 - 36.0 g/dL   RDW 13.0 11.5 - 15.5 %   Platelets 277 150 - 400 K/uL  Urinalysis, Routine w reflex microscopic  Result Value Ref Range   Color, Urine YELLOW YELLOW  APPearance CLEAR CLEAR   Specific Gravity, Urine 1.028 1.005 - 1.030   pH 5.0 5.0 - 8.0   Glucose, UA NEGATIVE NEGATIVE mg/dL   Hgb urine dipstick NEGATIVE NEGATIVE   Bilirubin Urine NEGATIVE NEGATIVE   Ketones, ur NEGATIVE NEGATIVE mg/dL   Protein, ur NEGATIVE NEGATIVE mg/dL   Nitrite NEGATIVE NEGATIVE   Leukocytes, UA NEGATIVE NEGATIVE  Hepatic function panel  Result Value Ref Range   Total Protein 6.5 6.5 - 8.1 g/dL   Albumin 3.1 (L) 3.5 - 5.0 g/dL   AST 22 15 - 41 U/L   ALT 15 (L) 17 - 63 U/L   Alkaline Phosphatase 73 38 - 126 U/L   Total Bilirubin 0.2 (L) 0.3 - 1.2 mg/dL   Bilirubin, Direct 0.2 0.1 - 0.5 mg/dL   Indirect Bilirubin 0.0 (L) 0.3 - 0.9 mg/dL  Lipase, blood  Result Value Ref Range   Lipase 29 11 - 51 U/L  Influenza panel by PCR (type A & B, H1N1)  Result Value Ref Range   Influenza A By PCR NEGATIVE NEGATIVE   Influenza B By PCR NEGATIVE NEGATIVE  Lactic acid, plasma  Result Value Ref Range   Lactic Acid, Venous 1.2 0.5 - 1.9 mmol/L  Creatinine, urine, random  Result Value Ref Range   Creatinine, Urine 97.59 mg/dL  Sodium, urine, random  Result Value Ref Range   Sodium, Ur 23 mmol/L  Cortisol-am, blood  Result Value Ref Range   Cortisol - AM 19.7 6.7 - 22.6 ug/dL  HIV antibody  Result Value Ref Range   HIV Screen 4th Generation wRfx Non Reactive Non Reactive  Strep pneumoniae urinary antigen  Result Value Ref Range   Strep Pneumo Urinary Antigen NEGATIVE NEGATIVE  CK  Result Value Ref Range   Total CK 25 (L) 49 - 397 U/L  Basic metabolic panel    Result Value Ref Range   Sodium 139 135 - 145 mmol/L   Potassium 3.8 3.5 - 5.1 mmol/L   Chloride 110 101 - 111 mmol/L   CO2 23 22 - 32 mmol/L   Glucose, Bld 106 (H) 65 - 99 mg/dL   BUN 19 6 - 20 mg/dL   Creatinine, Ser 1.08 0.61 - 1.24 mg/dL   Calcium 8.0 (L) 8.9 - 10.3 mg/dL   GFR calc non Af Amer >60 >60 mL/min   GFR calc Af Amer >60 >60 mL/min   Anion gap 6 5 - 15  CBC  Result Value Ref Range   WBC 9.5 4.0 - 10.5 K/uL   RBC 4.12 (L) 4.22 - 5.81 MIL/uL   Hemoglobin 12.9 (L) 13.0 - 17.0 g/dL   HCT 37.5 (L) 39.0 - 52.0 %   MCV 91.0 78.0 - 100.0 fL   MCH 31.3 26.0 - 34.0 pg   MCHC 34.4 30.0 - 36.0 g/dL   RDW 13.1 11.5 - 15.5 %   Platelets 279 150 - 400 K/uL  CBC  Result Value Ref Range   WBC 8.0 4.0 - 10.5 K/uL   RBC 3.76 (L) 4.22 - 5.81 MIL/uL   Hemoglobin 11.8 (L) 13.0 - 17.0 g/dL   HCT 33.6 (L) 39.0 - 52.0 %   MCV 89.4 78.0 - 100.0 fL   MCH 31.4 26.0 - 34.0 pg   MCHC 35.1 30.0 - 36.0 g/dL   RDW 12.7 11.5 - 15.5 %   Platelets 277 150 - 400 K/uL  Heparin level (unfractionated)  Result Value Ref Range   Heparin Unfractionated 1.60 (H) 0.30 -  0.70 IU/mL  Heparin level (unfractionated)  Result Value Ref Range   Heparin Unfractionated 1.01 (H) 0.30 - 0.70 IU/mL  Heparin level (unfractionated)  Result Value Ref Range   Heparin Unfractionated 0.47 0.30 - 0.70 IU/mL  CBC  Result Value Ref Range   WBC 6.6 4.0 - 10.5 K/uL   RBC 3.84 (L) 4.22 - 5.81 MIL/uL   Hemoglobin 11.9 (L) 13.0 - 17.0 g/dL   HCT 34.5 (L) 39.0 - 52.0 %   MCV 89.8 78.0 - 100.0 fL   MCH 31.0 26.0 - 34.0 pg   MCHC 34.5 30.0 - 36.0 g/dL   RDW 12.8 11.5 - 15.5 %   Platelets 324 150 - 400 K/uL  Basic metabolic panel  Result Value Ref Range   Sodium 141 135 - 145 mmol/L   Potassium 3.3 (L) 3.5 - 5.1 mmol/L   Chloride 110 101 - 111 mmol/L   CO2 23 22 - 32 mmol/L   Glucose, Bld 91 65 - 99 mg/dL   BUN 11 6 - 20 mg/dL   Creatinine, Ser 1.02 0.61 - 1.24 mg/dL   Calcium 8.3 (L) 8.9 - 10.3 mg/dL   GFR  calc non Af Amer >60 >60 mL/min   GFR calc Af Amer >60 >60 mL/min   Anion gap 8 5 - 15  Heparin level (unfractionated)  Result Value Ref Range   Heparin Unfractionated 0.47 0.30 - 0.70 IU/mL  CBG monitoring, ED  Result Value Ref Range   Glucose-Capillary 117 (H) 65 - 99 mg/dL  I-stat troponin, ED  Result Value Ref Range   Troponin i, poc 0.04 0.00 - 0.08 ng/mL   Comment 3          ECHOCARDIOGRAM COMPLETE  Result Value Ref Range   Weight 3,082.91 oz   Height 69 in   BP 145/62 mmHg      Assessment & Plan:   Problem List Items Addressed This Visit    Squamous cell carcinoma    History of invasive SCC, now recurrence forehead, on 5FU topical Followed by Dermatology at [facility]      Pulmonary emboli [facility])    Improved now without active PE on imaging anymore, last CT Chest 4/20018 with resolution, s/p Eliquis 4-5 months, continues current therapy for now - Follow-up with [facility] as scheduled in 2 months, and [facility]      Myasthenia gravis ([place]) - Primary (Chronic)    Stable chronic problem Controlled on Prednisone 5mg  daily [facility]      HLD (hyperlipidemia)    Stable, without recent lipid results available Continue Atorvastatin 20mg  daily      Essential hypertension    Controlled HTN No known complications   Plan:  1. Continue current BP regimen - Lisinopril 20mg  daily 2. Encourage improved lifestyle - low sodium diet, regular exercise 3. Continue monitor BP outside office, bring readings to next visit, if persistently >140/90 or new symptoms notify office sooner 4. Follow-up as needed - BP is managed by [facility]       Other Visit Diagnoses    Encounter to establish care with new doctor          Meds ordered this encounter  Medications  . fluorouracil (EFUDEX) 5 % cream    Sig: Apply topically 2 (two) times daily.      Follow up plan: Return in about 6 months (around 03/09/2017) for HTN, HLD, Back Pain.  Nobie Putnam, [facility]  09/07/2016, 10:33 PM

## 2016-09-07 NOTE — Assessment & Plan Note (Signed)
History of invasive SCC, now recurrence forehead, on 5FU topical Followed by Dermatology at [facility]

## 2016-09-07 NOTE — Assessment & Plan Note (Signed)
Controlled HTN No known complications   Plan:  1. Continue current BP regimen - Lisinopril 20mg  daily 2. Encourage improved lifestyle - low sodium diet, regular exercise 3. Continue monitor BP outside office, bring readings to next visit, if persistently >140/90 or new symptoms notify office sooner 4. Follow-up as needed - BP is managed by [facility]

## 2016-09-07 NOTE — Assessment & Plan Note (Signed)
Stable, without recent lipid results available Continue Atorvastatin 20mg  daily

## 2016-09-07 NOTE — Assessment & Plan Note (Signed)
Improved now without active PE on imaging anymore, last CT Chest 4/20018 with resolution, s/p Eliquis 4-5 months, continues current therapy for now - Follow-up with Pulm as scheduled in 2 months, and [facility]

## 2016-09-07 NOTE — Assessment & Plan Note (Signed)
Stable chronic problem Controlled on Prednisone 5mg  daily Followed by [facility]

## 2016-10-19 ENCOUNTER — Ambulatory Visit (HOSPITAL_COMMUNITY)
Admission: RE | Admit: 2016-10-19 | Discharge: 2016-10-19 | Disposition: A | Discharge status: Home / Self Care | Payer: Medicare Other | Source: Ambulatory Visit | Attending: Cardiology | Admitting: Cardiology

## 2016-10-19 DIAGNOSIS — I2782 Chronic pulmonary embolism: Secondary | ICD-10-CM

## 2016-10-19 DIAGNOSIS — I82513 Chronic embolism and thrombosis of femoral vein, bilateral: Secondary | ICD-10-CM | POA: Diagnosis not present

## 2016-10-26 ENCOUNTER — Telehealth: Payer: Self-pay | Admitting: Pulmonary Disease

## 2016-10-26 NOTE — Telephone Encounter (Signed)
Pt given results of Vas [place] LE DVT Mannam, Praveen, MD  Blankenship, Margie A, CMA        Please let the patient know that the CXR shows persistent clots in legs. I would recommend that he continue on the anticoagulation and will discuss further at next clinic visit.    Nothing further needed.

## 2016-11-05 ENCOUNTER — Ambulatory Visit (INDEPENDENT_AMBULATORY_CARE_PROVIDER_SITE_OTHER): Payer: Medicare Other | Admitting: Pulmonary Disease

## 2016-11-05 ENCOUNTER — Encounter: Payer: Self-pay | Admitting: Pulmonary Disease

## 2016-11-05 VITALS — BP 140/62 | HR 71 | Ht 69.0 in | Wt 200.6 lb

## 2016-11-05 DIAGNOSIS — J984 Other disorders of lung: Secondary | ICD-10-CM | POA: Diagnosis not present

## 2016-11-05 DIAGNOSIS — I2699 Other pulmonary embolism without acute cor pulmonale: Secondary | ICD-10-CM | POA: Diagnosis not present

## 2016-11-05 NOTE — Progress Notes (Signed)
Elijah Coby Sr.    [id number]    June 19, 1934  Primary Care Physician:Karamalegos, Devonne Doughty, DO  Referring Physician: Clinic, [place] [address] [facility] [place], [place] [address]  Chief complaint:  Follow-up for  Cavitary lung lesions PE, DVT  HPI: 82 year old with past medical history of hypertension, hyperlipidemia, GERD, prostate cancer. He was admitted in December 2017 for acute gastroenteritis, diarrhea. After discharge he was immobile at home for a couple of weeks due to weakness. He then had a readmission in early Jdxnuary 2018 with dyspnea, hypoxia. He had a CT which showed bilateral PE and a cavitary lesions in bilateral lungs. He was started on anticoagulation and given antibiotics for a total of 2 weeks. He returns to clinic after a follow up CT scan.  Interim History:  Chief complaint today is daytime sleepiness. He reports no fatigue. Denies any snoring, witnessed apneas. His breathing continues to be stable. Denies any cough, sputum production, fevers, chills, dyspnea, wheezing, chest pain, palpitations.   Outpatient Encounter Prescriptions as of 11/05/2016  Medication Sig  . acetaminophen (TYLENOL) 325 MG tablet Take 325-650 mg by mouth every 6 (six) hours as needed for headache (or pain).  Marland Kitchen apixaban (ELIQUIS) 5 MG TABS tablet Take 2 tablets (10 mg total) by mouth 2 (two) times daily.  Marland Kitchen atorvastatin (LIPITOR) 20 MG tablet Take 20 mg by mouth at bedtime.  . Cholecalciferol (VITAMIN D-3) 1000 UNITS CAPS Take 1,000 Units by mouth daily.  . fluorouracil (EFUDEX) 5 % cream Apply topically 2 (two) times daily.  Marland Kitchen latanoprost (XALATAN) 0.005 % ophthalmic solution Place 1 drop into both eyes at bedtime.  Marland Kitchen lisinopril (PRINIVIL,ZESTRIL) 20 MG tablet Take 20 mg by mouth every morning.  . Multiple Vitamins-Minerals (ONE-A-DAY MENS 50+ ADVANTAGE) TABS Take 1 tablet by mouth every morning.  Marland Kitchen omeprazole (PRILOSEC) 20 MG capsule Take 2 capsules (40  mg total) by mouth 2 (two) times daily before a meal.  . ondansetron (ZOFRAN ODT) 4 MG disintegrating tablet Take 1 tablet (4 mg total) by mouth every 8 (eight) hours as needed for nausea or vomiting.  . predniSONE (DELTASONE) 5 MG tablet Take 5 mg by mouth daily.  . sucralfate (CARAFATE) 1 GM/10ML suspension Take 10 mLs by mouth at bedtime.   No facility-administered encounter medications on file as of 11/05/2016.     Allergies as of 11/05/2016  . (No Known Allergies)    Past Medical History:  Diagnosis Date  . Dyslipidemia   . GERD (gastroesophageal reflux disease)   . Glaucoma   . Hyperlipidemia   . Hypertension   . Insomnia   . Myasthenia gravis ([place]) 09/08/2012  . Obesity   . Ocular myasthenia gravis ([place])   . Prostate cancer [facility])     Past Surgical History:  Procedure Laterality Date  . CATARACT EXTRACTION Bilateral   . CHOLECYSTECTOMY    . TRANSURETHRAL RESECTION OF PROSTATE      Family History  Problem Relation Age of Onset  . Heart attack Father     Social History   Social History  . Marital status: Married    Spouse name: Elijah Jackson  . Number of children: 3  . Years of education: 16   Occupational History  .      retired   Social History Main Topics  . Smoking status: Former Smoker    Quit date: 04/06/1970  . Smokeless tobacco: Never Used  . Alcohol use 0.0 oz/week  Comment: Consumes alcohol on occasion  . Drug use: No  . Sexual activity: Not on file   Other Topics Concern  . Not on file   Social History Narrative   Patient lives at home with his wife Veterinary surgeon)   Retired - [employer]   [place]   Right handed.   Caffeine- four cups daily.             Review of systems: Review of Systems  Constitutional: Negative for fever and chills.  HENT: Negative.   Eyes: Negative for blurred vision.  Respiratory: as per HPI  Cardiovascular: Negative for chest pain and palpitations.  Gastrointestinal: Negative for vomiting, diarrhea, blood per  rectum. Genitourinary: Negative for dysuria, urgency, frequency and hematuria.  Musculoskeletal: Negative for myalgias, back pain and joint pain.  Skin: Negative for itching and rash.  Neurological: Negative for dizziness, tremors, focal weakness, seizures and loss of consciousness.  Endo/Heme/Allergies: Negative for environmental allergies.  Psychiatric/Behavioral: Negative for depression, suicidal ideas and hallucinations.  All other systems reviewed and are negative.  Physical Exam: Blood pressure 140/62, pulse 71, height 5\' 9"  (1.753 m), weight 91 kg (200 lb 9.6 oz), SpO2 100 %. Gen:      No acute distress HEENT:  EOMI, sclera anicteric Neck:     No masses; no thyromegaly Lungs:    Clear to auscultation bilaterally; normal respiratory effort CV:         Regular rate and rhythm; no murmurs Abd:      + bowel sounds; soft, non-tender; no palpable masses, no distension Ext:    No edema; adequate peripheral perfusion Skin:      Warm and dry; no rash Neuro: alert and oriented x 3 Psych: normal mood and affect  Data Reviewed: CT scan 04/15/16- bilateral PE, saddle embolus in the right PA. Cavitation in the left upper lobe, rt upper lobe consolidation CT scan 05/01/16- decreased size of left upper lobe cavitary lesion. decrease in size of right upper lobe opacity CT scan 07/30/16- near-complete resolution of right upper lobe opacity and left upper lobe cavitary lesion with small residual scarring, Images reviewed  Echo 04/16/16-EF 65-70 percent, normal LV size, normal PA pressure Lower extremity Doppler 04/16/16-bilateral lower extremity DVT  Lower extremity Dopplers 10/19/16-  Chronic, non occlusive, thrombus in the right deep femoral and popliteal veins.  Chronic, non occlusive, thrombus in the left distal common femoral vein extending into the ostium and proximal femoral vein.  Chronic, occlusive thrombus in the left mid femoral vein.  Chronic, non occlusive, thrombus in the left distal  femoral vein and popliteal vein. Incompetent right peroneal vein. Incompetent left proximal femoral and popliteal veins.  Assessment:  Cavitary lung lesions This is likely infectious in etiology. His latest CT scan shows resolution in these lesions with residual scarring. I do not believe he'll need a follow-up CT scan. He has some coronary artery calcifications and will follow up with his primary care at the [facility] for further evaluation  Bilateral pulmonary embolus Likely secondary to immobility after his admission with gastroenteritis in late December. Repeat US of the LE shows persistent DVT. He will likely need indefinite anticoagulation  Daytime sleepiness I recommended that he get evaluated with a sleep study. He prefers to discuss this with his team at the [place]. He'll let us know if he wants to get it done at [facility].  Plan/Recommendations: - Continue anticoagulation for PE - Follow up at the [place]  Return to clinic as needed.  Marshell Garfinkel MD  [facility] Pager [phone] 11/05/2016, 3:18 PM  CC: [facility], [place]

## 2016-11-05 NOTE — Patient Instructions (Signed)
I have reviewed your CT scan and leg ultrasound Please continue anticoagulation for now Please discuss with the doctors at the [place] about getting a sleep study  Return to clinic as needed.

## 2016-12-23 ENCOUNTER — Ambulatory Visit: Payer: Medicare Other | Admitting: Family Medicine

## 2016-12-24 ENCOUNTER — Encounter: Payer: Self-pay | Admitting: Family Medicine

## 2016-12-24 ENCOUNTER — Ambulatory Visit (INDEPENDENT_AMBULATORY_CARE_PROVIDER_SITE_OTHER): Payer: Medicare Other | Admitting: Family Medicine

## 2016-12-24 VITALS — BP 131/57 | HR 70 | Temp 97.5°F | Ht 69.0 in | Wt 197.2 lb

## 2016-12-24 DIAGNOSIS — G4719 Other hypersomnia: Secondary | ICD-10-CM

## 2016-12-24 DIAGNOSIS — R110 Nausea: Secondary | ICD-10-CM

## 2016-12-24 DIAGNOSIS — R197 Diarrhea, unspecified: Secondary | ICD-10-CM

## 2016-12-24 DIAGNOSIS — R29818 Other symptoms and signs involving the nervous system: Secondary | ICD-10-CM

## 2016-12-24 DIAGNOSIS — R531 Weakness: Secondary | ICD-10-CM | POA: Diagnosis not present

## 2016-12-24 DIAGNOSIS — G4733 Obstructive sleep apnea (adult) (pediatric): Secondary | ICD-10-CM | POA: Insufficient documentation

## 2016-12-24 MED ORDER — ONDANSETRON 4 MG PO TBDP: 4.0000 mg | ORAL_TABLET | Freq: Three times a day (TID) | ORAL | 1 refills | Status: DC | PRN

## 2016-12-24 NOTE — Patient Instructions (Addendum)
Thank you for coming to the clinic today.  1.  Most likely a viral gastroenteritis, may run it's course, do not know exactly why now.  Consider imodium over the counter if persistent diarrhea large amount and need to stop it, otherwise if limited amount should not need it  Try to rehydrate with gatorade and water, also can do cranberry juice  Try zofran as needed for nausea, may take 2 pills if severe  WIll hold off on stool culture and tests, for C diff right now since no pain and no fever or other severe symptoms. If you have persistent >5 days then call office and we order stool testing at that time.  ---------------- Will order sleep study, [facility], stay tuned, if not heard in 2 weeks then call us to check status  If severe worsening symptoms, fever chills abdominal pain blood in stool dehydration then may go to hospital ED  Please schedule a Follow-up Appointment to: Return in about 1 week (around 12/31/2016), or if symptoms worsen or fail to improve, for diarrhea, weakness.  If you have any other questions or concerns, please feel free to call the clinic or send a message through [facility]. You may also schedule an earlier appointment if necessary.  Additionally, you may be receiving a survey about your experience at our clinic within a few days to 1 week by e-mail or mail. We value your feedback.  Nobie Putnam, DO [facility]   Viral Gastroenteritis, Adult Viral gastroenteritis is also known as the stomach flu. This condition is caused by various viruses. These viruses can be passed from person to person very easily (are very contagious). This condition may affect your stomach, small intestine, and large intestine. It can cause sudden watery diarrhea, fever, and vomiting. Diarrhea and vomiting can make you feel weak and cause you to become dehydrated. You may not be able to keep fluids down. Dehydration can make you tired and thirsty, cause  you to have a dry mouth, and decrease how often you urinate. Older adults and people with other diseases or a weak immune system are at higher risk for dehydration. It is important to replace the fluids that you lose from diarrhea and vomiting. If you become severely dehydrated, you may need to get fluids through an IV tube. What are the causes? Gastroenteritis is caused by various viruses, including rotavirus and norovirus. Norovirus is the most common cause in adults. You can get sick by eating food, drinking water, or touching a surface contaminated with one of these viruses. You can also get sick from sharing utensils or other personal items with an infected person. What increases the risk? This condition is more likely to develop in people:  Who have a weak defense system (immune system).  Who live with one or more children who are younger than 55 years old.  Who live in a nursing home.  Who go on cruise ships.  What are the signs or symptoms? Symptoms of this condition start suddenly 1-2 days after exposure to a virus. Symptoms may last a few days or as long as a week. The most common symptoms are watery diarrhea and vomiting. Other symptoms include:  Fever.  Headache.  Fatigue.  Pain in the abdomen.  Chills.  Weakness.  Nausea.  Muscle aches.  Loss of appetite.  How is this diagnosed? This condition is diagnosed with a medical history and physical exam. You may also have a stool test to check for viruses  or other infections. How is this treated? This condition typically goes away on its own. The focus of treatment is to restore lost fluids (rehydration). Your health care provider may recommend that you take an oral rehydration solution (ORS) to replace important salts and minerals (electrolytes) in your body. Severe cases of this condition may require giving fluids through an IV tube. Treatment may also include medicine to help with your symptoms. Follow these  instructions at home: Follow instructions from your health care provider about how to care for yourself at home. Eating and drinking Follow these recommendations as told by your health care provider:  Take an ORS. This is a drink that is sold at pharmacies and retail stores.  Drink clear fluids in small amounts as you are able. Clear fluids include water, ice chips, diluted fruit juice, and low-calorie sports drinks.  Eat bland, easy-to-digest foods in small amounts as you are able. These foods include bananas, applesauce, rice, lean meats, toast, and crackers.  Avoid fluids that contain a lot of sugar or caffeine, such as energy drinks, sports drinks, and soda.  Avoid alcohol.  Avoid spicy or fatty foods.  General instructions   Drink enough fluid to keep your urine clear or pale yellow.  Wash your hands often. If soap and water are not available, use hand sanitizer.  Make sure that all people in your household wash their hands well and often.  Take over-the-counter and prescription medicines only as told by your health care provider.  Rest at home while you recover.  Watch your condition for any changes.  Take a warm bath to relieve any burning or pain from frequent diarrhea episodes.  Keep all follow-up visits as told by your health care provider. This is important. Contact a health care provider if:  You cannot keep fluids down.  Your symptoms get worse.  You have new symptoms.  You feel light-headed or dizzy.  You have muscle cramps. Get help right away if:  You have chest pain.  You feel extremely weak or you faint.  You see blood in your vomit.  Your vomit looks like coffee grounds.  You have bloody or black stools or stools that look like tar.  You have a severe headache, a stiff neck, or both.  You have a rash.  You have severe pain, cramping, or bloating in your abdomen.  You have trouble breathing or you are breathing very quickly.  Your  heart is beating very quickly.  Your skin feels cold and clammy.  You feel confused.  You have pain when you urinate.  You have signs of dehydration, such as: ? Dark urine, very little urine, or no urine. ? Cracked lips. ? Dry mouth. ? Sunken eyes. ? Sleepiness. ? Weakness. This information is not intended to replace advice given to you by your health care provider. Make sure you discuss any questions you have with your health care provider. Document Released: 03/23/2005 Document Revised: 09/04/2015 Document Reviewed: 11/27/2014 Elsevier Interactive Patient Education  2017 [employer].

## 2016-12-24 NOTE — Assessment & Plan Note (Signed)
Consistent with viral gastroenteritis vs food poisoning, x 24 hr symptoms diarrhea / nausea w/o vomit.  No known sick contacts, no daycare contact - Currently well appearing and non-toxic, clinically well hydrated on exam, benign abdomen - Concern with prior history viral gastro in 03/2016-04/2016 hospitalization, C diff was negative  Plan: 1. Reassurance, likely self-limited 7-10 days 2. Continue PO as tolerated. May try to increase hydration, clear fluids (gatorade, water, apple/cranberry) 3. Precaution given rx Zofran 4mg ODT q 8 hr PRN 4. Given limited other symptoms (no abdomen pain, fever, and no recent antibiotics) low risk for C diff, will hold off testing and stool culture for now, if symptoms not improved by 5-7 days then can notify office and return to pick up stool culture kit, also may draw labs chemistry, cbc, since [facility] 5. Return criteria reviewed - when to go to hospital if concern for acute worsening, dehydration 

## 2016-12-24 NOTE — Assessment & Plan Note (Signed)
Persistent clinical concern >6 months now suspected obstructive sleep apnea given reported symptoms with weakness / fatigue and excessive sleepiness. - Screening: ESS score 15 / STOP-Bang Score 5 = high risk - Neck Circumference: 17" - Co-morbidities: HTN  Plan: 1. Discussion on initial diagnosis and testing for OSA, risk factors, management, complications 2. Agree to proceed with sleep study testing based on clinical concerns - proceed with [facility], order form completed, to be faxed

## 2016-12-24 NOTE — Progress Notes (Signed)
Subjective:    Patient ID: Elijah Ponder Sr., male    DOB: [birth year], 82 y.o.   MRN: [id number]  Elijah Bertino Sr. is a 82 y.o. male presenting on 12/24/2016 for Fatigue (tirededness x 6-26mhs  diarrhea, nauseated x 1 day )  Patient presents for a same day appointment. Accompanied by wife, Crystal.  HPI   DIARRHEA, Acute - Reports new complaint with acute onset watery diarrhea since midnight last night with several episodes of loose diarrhea initially larger amount about 1x every 1-2 hours, then reduced amount of stool and now limited amount but mostly clear watery - No recent sick contacts. No recent antibiotics. No new exposures (outdoor, environmental, unusual foods, daycare or young children). Lives at home with wife, and 2 indoor cats - He has significant history of prior diarrheal illness following acute pulmonary illness and pneumonia and lung mass infection in 03/2016, he had persistent diarrhea, dx with gastroenteritis, C Diff testing at that time was negative. No prior history of C Diff. He has had some intermittent episodes of diarrhea over past 9 months, but nothing sustained, now concern with new symptoms - Tolerating some PO but limited. He does not drink much water normally. Last meal before onset symptoms salad and soup - Admits nausea, seems mostly constant without vomiting - Denies any blood in stool, dark stool, fever, chills sweats, abdominal pain, bloating, dyspnea, cough, URI symptoms  DAYTIME SLEEPINESS / GENERALIZED WEAKNESS: - Reports concern with chronic concern of "weakness" over past >6 months, he has discussed this with several other physicians, and no clear answer. He states it is not attributed to any new medicines, no change since being off Eliquis - He has tried to improve nutrition with some meal supplement, Ensure, seems to help some but does not resolve symptoms - Additional concern with excessive sleepiness during day, he can fall asleep easily, see ESS -  [facility] and they were discussing a sleep study but never decided on plan, he would like to proceed with this test at sleep center - Denies observed apnea, snoring  Additional update: - History of PE and DVTs bilateral LE, he was on Eliquis, this was [facility].  Epworth Sleepiness Scale Total Score: 15 (Positive high risk) Sitting and reading - 3 Watching TV - 3 Sitting inactive in a public place - 0 As a passenger in a car for an hour without a break - 2 Lying down to rest in the afternoon when circumstances permit - 3 Sitting and talking to someone - 1 Sitting quietly after a lunch without alcohol - 3 In a car, while stopped for a few minutes in traffic - 0  STOP-Bang OSA scoring Snoring no   Tiredness yes   Observed apneas no   Pressure HTN yes   BMI > 35 kg/m2 no   Age > 568 yes   Neck (male >17 in; Male >16 in)  yes >17"  Gender male yes   OSA risk low (0-2)  OSA risk intermediate (3-4)  OSA risk high (5+)  Total: 5 (High Risk)   Health Maintenance: - [facility]  Depression screen PHQ 2/9 09/07/2016  Decreased Interest 0  Down, Depressed, Hopeless 0  PHQ - 2 Score 0    Social History  Substance Use Topics  . Smoking status: Former Smoker    Quit date: 04/06/1970  . Smokeless tobacco: Never Used  .  Alcohol use 0.0 oz/week     Comment: Consumes alcohol on occasion    Review of Systems Per HPI unless specifically indicated above     Objective:    BP (!) 131/57 (BP Location: Right Arm, Patient Position: Sitting, Cuff Size: Normal)   Pulse 70   Temp (!) 97.5 F (36.4 C) (Oral)   Ht _0  (1.753 m)   Wt 197 lb 3.2 oz (89.4 kg)   BMI 29.12 kg/m   Wt Readings from Last 3 Encounters:  12/24/16 197 lb 3.2 oz (89.4 kg)  11/05/16 200 lb 9.6 oz (91 kg)  09/07/16 198 lb 12.8 oz (90.2 kg)    Physical Exam  Constitutional: He is oriented to person, place, and time. He  appears well-developed and well-nourished. No distress.  Well-appearing, comfortable, cooperative  HENT:  Head: Normocephalic and atraumatic.  Mouth/Throat: Oropharynx is clear and moist.  Oropharynx clear without erythema, exudates, edema or asymmetry.  Eyes: Conjunctivae are normal. Right eye exhibits no discharge. Left eye exhibits no discharge.  Neck: Normal range of motion.  Neck Circumference: >17"  Cardiovascular: Normal rate, regular rhythm, normal heart sounds and intact distal pulses.   No murmur heard. Pulmonary/Chest: Effort normal and breath sounds normal. No respiratory distress. He has no wheezes. He has no rales.  Some baseline rhonchi and rales with known scarring in lungs, no focal abnormality. No increased work of breathing.  Abdominal: Soft. Bowel sounds are normal. He exhibits no distension and no mass. There is no tenderness. There is no rebound and no guarding.  Musculoskeletal: Normal range of motion. He exhibits no edema.  Neurological: He is alert and oriented to person, place, and time.  Skin: Skin is warm and dry. No rash noted. He is not diaphoretic. No erythema.  Psychiatric: He has a normal mood and affect. His behavior is normal.  Well groomed, good eye contact, normal speech and thoughts  Nursing note and vitals reviewed.  Results for orders placed or performed during the hospital encounter of 04/14/16  Culture, blood (routine x 2) Call MD if unable to obtain prior to antibiotics being given  Result Value Ref Range   Specimen Description BLOOD LEFT ARM    Special Requests BOTTLES DRAWN AEROBIC AND ANAEROBIC 5ML    Culture NO GROWTH 5 DAYS    Report Status 04/19/2016 FINAL   Culture, blood (routine x 2) Call MD if unable to obtain prior to antibiotics being given  Result Value Ref Range   Specimen Description BLOOD RIGHT HAND    Special Requests IN PEDIATRIC BOTTLE 3CC    Culture NO GROWTH 5 DAYS    Report Status 04/19/2016 FINAL   MRSA PCR Screening    Result Value Ref Range   MRSA by PCR NEGATIVE NEGATIVE  Basic metabolic panel  Result Value Ref Range   Sodium 136 135 - 145 mmol/L   Potassium 3.4 (L) 3.5 - 5.1 mmol/L   Chloride 100 (L) 101 - 111 mmol/L   CO2 25 22 - 32 mmol/L   Glucose, Bld 114 (H) 65 - 99 mg/dL   BUN 20 6 - 20 mg/dL   Creatinine, Ser 1.32 (H) 0.61 - 1.24 mg/dL   Calcium 8.8 (L) 8.9 - 10.3 mg/dL   GFR calc non Af Amer 50 (L) >60 mL/min   GFR calc Af Amer 58 (L) >60 mL/min   Anion gap 11 5 - 15  CBC  Result Value Ref Range   WBC 10.3 4.0 - 10.5 K/uL  RBC 4.81 4.22 - 5.81 MIL/uL   Hemoglobin 15.3 13.0 - 17.0 g/dL   HCT 99.6 72.2 - 77.3 %   MCV 90.4 78.0 - 100.0 fL   MCH 31.8 26.0 - 34.0 pg   MCHC 35.2 30.0 - 36.0 g/dL   RDW 75.0 51.0 - 71.2 %   Platelets 277 150 - 400 K/uL  Urinalysis, Routine w reflex microscopic  Result Value Ref Range   Color, Urine YELLOW YELLOW   APPearance CLEAR CLEAR   Specific Gravity, Urine 1.028 1.005 - 1.030   pH 5.0 5.0 - 8.0   Glucose, UA NEGATIVE NEGATIVE mg/dL   Hgb urine dipstick NEGATIVE NEGATIVE   Bilirubin Urine NEGATIVE NEGATIVE   Ketones, ur NEGATIVE NEGATIVE mg/dL   Protein, ur NEGATIVE NEGATIVE mg/dL   Nitrite NEGATIVE NEGATIVE   Leukocytes, UA NEGATIVE NEGATIVE  Hepatic function panel  Result Value Ref Range   Total Protein 6.5 6.5 - 8.1 g/dL   Albumin 3.1 (L) 3.5 - 5.0 g/dL   AST 22 15 - 41 U/L   ALT 15 (L) 17 - 63 U/L   Alkaline Phosphatase 73 38 - 126 U/L   Total Bilirubin 0.2 (L) 0.3 - 1.2 mg/dL   Bilirubin, Direct 0.2 0.1 - 0.5 mg/dL   Indirect Bilirubin 0.0 (L) 0.3 - 0.9 mg/dL  Lipase, blood  Result Value Ref Range   Lipase 29 11 - 51 U/L  Influenza panel by PCR (type A & B, H1N1)  Result Value Ref Range   Influenza A By PCR NEGATIVE NEGATIVE   Influenza B By PCR NEGATIVE NEGATIVE  Lactic acid, plasma  Result Value Ref Range   Lactic Acid, Venous 1.2 0.5 - 1.9 mmol/L  Creatinine, urine, random  Result Value Ref Range   Creatinine, Urine  97.59 mg/dL  Sodium, urine, random  Result Value Ref Range   Sodium, Ur 23 mmol/L  Cortisol-am, blood  Result Value Ref Range   Cortisol - AM 19.7 6.7 - 22.6 ug/dL  HIV antibody  Result Value Ref Range   HIV Screen 4th Generation wRfx Non Reactive Non Reactive  Strep pneumoniae urinary antigen  Result Value Ref Range   Strep Pneumo Urinary Antigen NEGATIVE NEGATIVE  CK  Result Value Ref Range   Total CK 25 (L) 49 - 397 U/L  Basic metabolic panel  Result Value Ref Range   Sodium 139 135 - 145 mmol/L   Potassium 3.8 3.5 - 5.1 mmol/L   Chloride 110 101 - 111 mmol/L   CO2 23 22 - 32 mmol/L   Glucose, Bld 106 (H) 65 - 99 mg/dL   BUN 19 6 - 20 mg/dL   Creatinine, Ser 5.24 0.61 - 1.24 mg/dL   Calcium 8.0 (L) 8.9 - 10.3 mg/dL   GFR calc non Af Amer >60 >60 mL/min   GFR calc Af Amer >60 >60 mL/min   Anion gap 6 5 - 15  CBC  Result Value Ref Range   WBC 9.5 4.0 - 10.5 K/uL   RBC 4.12 (L) 4.22 - 5.81 MIL/uL   Hemoglobin 12.9 (L) 13.0 - 17.0 g/dL   HCT 79.9 (L) 80.0 - 12.3 %   MCV 91.0 78.0 - 100.0 fL   MCH 31.3 26.0 - 34.0 pg   MCHC 34.4 30.0 - 36.0 g/dL   RDW 93.5 94.0 - 90.5 %   Platelets 279 150 - 400 K/uL  CBC  Result Value Ref Range   WBC 8.0 4.0 - 10.5 K/uL   RBC  3.76 (L) 4.22 - 5.81 MIL/uL   Hemoglobin 11.8 (L) 13.0 - 17.0 g/dL   HCT 33.6 (L) 39.0 - 52.0 %   MCV 89.4 78.0 - 100.0 fL   MCH 31.4 26.0 - 34.0 pg   MCHC 35.1 30.0 - 36.0 g/dL   RDW 12.7 11.5 - 15.5 %   Platelets 277 150 - 400 K/uL  Heparin level (unfractionated)  Result Value Ref Range   Heparin Unfractionated 1.60 (H) 0.30 - 0.70 IU/mL  Heparin level (unfractionated)  Result Value Ref Range   Heparin Unfractionated 1.01 (H) 0.30 - 0.70 IU/mL  Heparin level (unfractionated)  Result Value Ref Range   Heparin Unfractionated 0.47 0.30 - 0.70 IU/mL  CBC  Result Value Ref Range   WBC 6.6 4.0 - 10.5 K/uL   RBC 3.84 (L) 4.22 - 5.81 MIL/uL   Hemoglobin 11.9 (L) 13.0 - 17.0 g/dL   HCT 34.5 (L) 39.0 -  52.0 %   MCV 89.8 78.0 - 100.0 fL   MCH 31.0 26.0 - 34.0 pg   MCHC 34.5 30.0 - 36.0 g/dL   RDW 12.8 11.5 - 15.5 %   Platelets 324 150 - 400 K/uL  Basic metabolic panel  Result Value Ref Range   Sodium 141 135 - 145 mmol/L   Potassium 3.3 (L) 3.5 - 5.1 mmol/L   Chloride 110 101 - 111 mmol/L   CO2 23 22 - 32 mmol/L   Glucose, Bld 91 65 - 99 mg/dL   BUN 11 6 - 20 mg/dL   Creatinine, Ser 1.02 0.61 - 1.24 mg/dL   Calcium 8.3 (L) 8.9 - 10.3 mg/dL   GFR calc non Af Amer >60 >60 mL/min   GFR calc Af Amer >60 >60 mL/min   Anion gap 8 5 - 15  Heparin level (unfractionated)  Result Value Ref Range   Heparin Unfractionated 0.47 0.30 - 0.70 IU/mL  CBG monitoring, ED  Result Value Ref Range   Glucose-Capillary 117 (H) 65 - 99 mg/dL  I-stat troponin, ED  Result Value Ref Range   Troponin i, poc 0.04 0.00 - 0.08 ng/mL   Comment 3          ECHOCARDIOGRAM COMPLETE  Result Value Ref Range   Weight 3,082.91 oz   Height 69 in   BP 145/62 mmHg      Assessment & Plan:   Problem List Items Addressed This Visit    Diarrhea - Primary    Consistent with viral gastroenteritis vs food poisoning, x 24 hr symptoms diarrhea / nausea w/o vomit.  No known sick contacts, no daycare contact - Currently well appearing and non-toxic, clinically well hydrated on exam, benign abdomen - Concern with prior history viral gastro in 03/2016-04/2016 hospitalization, C diff was negative  Plan: 1. Reassurance, likely self-limited 7-10 days 2. Continue PO as tolerated. May try to increase hydration, clear fluids (gatorade, water, apple/cranberry) 3. Precaution given rx Zofran 27m ODT q 8 hr PRN 4. Given limited other symptoms (no abdomen pain, fever, and no recent antibiotics) low risk for C diff, will hold off testing and stool culture for now, if symptoms not improved by 5-7 days then can notify office and return to pick up stool culture kit, also may draw labs chemistry, cbc, [facility] [place] [facility]  Return criteria reviewed - when to go to hospital if concern for acute worsening, dehydration      Excessive daytime sleepiness    Persistent clinical concern >6 months now suspected obstructive  sleep apnea given reported symptoms with weakness / fatigue and excessive sleepiness. - Screening: ESS score 15 / STOP-Bang Score 5 = high risk - Neck Circumference: 17" - Co-morbidities: HTN  Plan: 1. Discussion on initial diagnosis and testing for OSA, risk factors, management, complications 2. Agree to proceed with sleep study testing based on clinical concerns - proceed with [facility], order form completed, to be faxed       Other Visit Diagnoses    Nausea       Relevant Medications   ondansetron (ZOFRAN ODT) 4 MG disintegrating tablet   Generalized weakness       Explore OSA next, given chronic concern, seems possibly nutritionally related, slight improved on Ensure. May check labs and vitamins in future   Suspected sleep apnea          Meds ordered this encounter  Medications  . ondansetron (ZOFRAN ODT) 4 MG disintegrating tablet    Sig: Take 1 tablet (4 mg total) by mouth every 8 (eight) hours as needed for nausea or vomiting.    Dispense:  30 tablet    Refill:  1    Follow up plan: Return in about 1 week (around 12/31/2016), or if symptoms worsen or fail to improve, for diarrhea, weakness.  Nobie Putnam, [facility] 12/24/2016, 4:53 PM

## 2017-01-08 ENCOUNTER — Emergency Department: Payer: Medicare Other

## 2017-01-08 ENCOUNTER — Inpatient Hospital Stay
Admission: EM | Admit: 2017-01-08 | Discharge: 2017-01-10 | DRG: 194 | Disposition: A | Discharge status: Home / Self Care | Payer: Medicare Other | Attending: Internal Medicine | Admitting: Internal Medicine

## 2017-01-08 DIAGNOSIS — E860 Dehydration: Secondary | ICD-10-CM | POA: Diagnosis present

## 2017-01-08 DIAGNOSIS — Z8546 Personal history of malignant neoplasm of prostate: Secondary | ICD-10-CM | POA: Diagnosis not present

## 2017-01-08 DIAGNOSIS — K219 Gastro-esophageal reflux disease without esophagitis: Secondary | ICD-10-CM | POA: Diagnosis present

## 2017-01-08 DIAGNOSIS — J181 Lobar pneumonia, unspecified organism: Principal | ICD-10-CM | POA: Diagnosis present

## 2017-01-08 DIAGNOSIS — Z79899 Other long term (current) drug therapy: Secondary | ICD-10-CM | POA: Diagnosis not present

## 2017-01-08 DIAGNOSIS — G4700 Insomnia, unspecified: Secondary | ICD-10-CM | POA: Diagnosis present

## 2017-01-08 DIAGNOSIS — J189 Pneumonia, unspecified organism: Secondary | ICD-10-CM

## 2017-01-08 DIAGNOSIS — Z87891 Personal history of nicotine dependence: Secondary | ICD-10-CM

## 2017-01-08 DIAGNOSIS — H409 Unspecified glaucoma: Secondary | ICD-10-CM | POA: Diagnosis present

## 2017-01-08 DIAGNOSIS — E785 Hyperlipidemia, unspecified: Secondary | ICD-10-CM | POA: Diagnosis present

## 2017-01-08 DIAGNOSIS — N179 Acute kidney failure, unspecified: Secondary | ICD-10-CM | POA: Diagnosis present

## 2017-01-08 DIAGNOSIS — I10 Essential (primary) hypertension: Secondary | ICD-10-CM | POA: Diagnosis present

## 2017-01-08 DIAGNOSIS — R17 Unspecified jaundice: Secondary | ICD-10-CM | POA: Diagnosis present

## 2017-01-08 LAB — COMPREHENSIVE METABOLIC PANEL
ALBUMIN: 3.9 g/dL (ref 3.5–5.0)
ALT: 23 U/L (ref 17–63)
ANION GAP: 10 (ref 5–15)
AST: 25 U/L (ref 15–41)
Alkaline Phosphatase: 60 U/L (ref 38–126)
BUN: 27 mg/dL — AB (ref 6–20)
CALCIUM: 8.9 mg/dL (ref 8.9–10.3)
CO2: 25 mmol/L (ref 22–32)
Chloride: 103 mmol/L (ref 101–111)
Creatinine, Ser: 1.72 mg/dL — ABNORMAL HIGH (ref 0.61–1.24)
GFR calc Af Amer: 41 mL/min — ABNORMAL LOW (ref >60)
GFR calc non Af Amer: 36 mL/min — ABNORMAL LOW (ref >60)
GLUCOSE: 140 mg/dL — AB (ref 65–99)
Potassium: 4 mmol/L (ref 3.5–5.1)
SODIUM: 138 mmol/L (ref 135–145)
TOTAL PROTEIN: 7.3 g/dL (ref 6.5–8.1)
Total Bilirubin: 1.8 mg/dL — ABNORMAL HIGH (ref 0.3–1.2)

## 2017-01-08 LAB — CBC
HCT: 41.1 % (ref 40.0–52.0)
Hemoglobin: 14.1 g/dL (ref 13.0–18.0)
MCH: 32.9 pg (ref 26.0–34.0)
MCHC: 34.3 g/dL (ref 32.0–36.0)
MCV: 95.8 fL (ref 80.0–100.0)
Platelets: 167 10*3/uL (ref 150–440)
RBC: 4.29 MIL/uL — ABNORMAL LOW (ref 4.40–5.90)
RDW: 13.4 % (ref 11.5–14.5)
WBC: 12.9 10*3/uL — ABNORMAL HIGH (ref 3.8–10.6)

## 2017-01-08 LAB — URINALYSIS, COMPLETE (UACMP) WITH MICROSCOPIC
Bacteria, UA: NONE SEEN
Bilirubin Urine: NEGATIVE
GLUCOSE, UA: NEGATIVE mg/dL
Ketones, ur: NEGATIVE mg/dL
Leukocytes, UA: NEGATIVE
NITRITE: NEGATIVE
PH: 6 (ref 5.0–8.0)
PROTEIN: 30 mg/dL — AB
SPECIFIC GRAVITY, URINE: 1.042 — AB (ref 1.005–1.030)

## 2017-01-08 LAB — LACTIC ACID, PLASMA: LACTIC ACID, VENOUS: 0.9 mmol/L (ref 0.5–1.9)

## 2017-01-08 LAB — APTT: aPTT: 28 seconds (ref 24–36)

## 2017-01-08 LAB — PROTIME-INR
INR: 1.03
Prothrombin Time: 13.4 seconds (ref 11.4–15.2)

## 2017-01-08 LAB — LIPASE, BLOOD: Lipase: 21 U/L (ref 11–51)

## 2017-01-08 LAB — MAGNESIUM: Magnesium: 2.1 mg/dL (ref 1.7–2.4)

## 2017-01-08 MED ORDER — ONDANSETRON HCL 4 MG PO TABS: 4.0000 mg | ORAL_TABLET | Freq: Four times a day (QID) | ORAL | Status: DC | PRN

## 2017-01-08 MED ORDER — ONDANSETRON HCL 4 MG/2ML IJ SOLN: 4.0000 mg | Freq: Four times a day (QID) | INTRAMUSCULAR | Status: DC | PRN

## 2017-01-08 MED ORDER — DEXTROSE 5 % IV SOLN
INTRAVENOUS | Status: DC
  Administered 2017-01-09 – 2017-01-10 (×2): via INTRAVENOUS
  Filled 2017-01-08 (×2): qty 10

## 2017-01-08 MED ORDER — CEFTRIAXONE SODIUM IN DEXTROSE 20 MG/ML IV SOLN
1.0000 g | Freq: Once | INTRAVENOUS | Status: AC
  Administered 2017-01-08: 1 g via INTRAVENOUS
  Filled 2017-01-08: qty 50

## 2017-01-08 MED ORDER — SODIUM CHLORIDE 0.9 % IV SOLN
1000.0000 mL | Freq: Once | INTRAVENOUS | Status: AC
  Administered 2017-01-08: 1000 mL via INTRAVENOUS

## 2017-01-08 MED ORDER — MORPHINE SULFATE (PF) 4 MG/ML IV SOLN
4.0000 mg | INTRAVENOUS | Status: DC | PRN
  Administered 2017-01-08: 13:00:00 4 mg via INTRAVENOUS
  Filled 2017-01-08: qty 1

## 2017-01-08 MED ORDER — BISACODYL 5 MG PO TBEC
5.0000 mg | DELAYED_RELEASE_TABLET | Freq: Every day | ORAL | Status: DC | PRN
  Administered 2017-01-10: 5 mg via ORAL
  Filled 2017-01-08: qty 1

## 2017-01-08 MED ORDER — ACETAMINOPHEN 325 MG PO TABS
650.0000 mg | ORAL_TABLET | Freq: Four times a day (QID) | ORAL | Status: DC | PRN
  Administered 2017-01-10: 650 mg via ORAL
  Filled 2017-01-08: qty 2

## 2017-01-08 MED ORDER — SENNOSIDES-DOCUSATE SODIUM 8.6-50 MG PO TABS: 1.0000 | ORAL_TABLET | Freq: Every evening | ORAL | Status: DC | PRN

## 2017-01-08 MED ORDER — IOPAMIDOL (ISOVUE-300) INJECTION 61%
30.0000 mL | Freq: Once | INTRAVENOUS | Status: AC | PRN
  Administered 2017-01-08: 30 mL via ORAL

## 2017-01-08 MED ORDER — CEFTRIAXONE SODIUM IN DEXTROSE 20 MG/ML IV SOLN
1.0000 g | INTRAVENOUS | Status: DC
  Filled 2017-01-08: qty 50

## 2017-01-08 MED ORDER — PANTOPRAZOLE SODIUM 40 MG PO TBEC
40.0000 mg | DELAYED_RELEASE_TABLET | Freq: Every day | ORAL | Status: DC
  Administered 2017-01-08 – 2017-01-09 (×2): 40 mg via ORAL
  Filled 2017-01-08 (×2): qty 1

## 2017-01-08 MED ORDER — HEPARIN SODIUM (PORCINE) 5000 UNIT/ML IJ SOLN
5000.0000 [IU] | Freq: Three times a day (TID) | INTRAMUSCULAR | Status: DC
  Administered 2017-01-09: 14:00:00 5000 [IU] via SUBCUTANEOUS
  Filled 2017-01-08 (×3): qty 1

## 2017-01-08 MED ORDER — LATANOPROST 0.005 % OP SOLN
1.0000 [drp] | Freq: Every day | OPHTHALMIC | Status: DC
  Administered 2017-01-08 – 2017-01-09 (×2): 1 [drp] via OPHTHALMIC
  Filled 2017-01-08: qty 2.5

## 2017-01-08 MED ORDER — MORPHINE SULFATE (PF) 2 MG/ML IV SOLN
2.0000 mg | Freq: Once | INTRAVENOUS | Status: AC
  Administered 2017-01-08: 2 mg via INTRAVENOUS
  Filled 2017-01-08: qty 1

## 2017-01-08 MED ORDER — ENSURE ENLIVE PO LIQD
237.0000 mL | Freq: Two times a day (BID) | ORAL | Status: DC
  Administered 2017-01-09 – 2017-01-10 (×3): 237 mL via ORAL

## 2017-01-08 MED ORDER — DEXTROSE 5 % IV SOLN
500.0000 mg | Freq: Once | INTRAVENOUS | Status: AC
  Administered 2017-01-08: 500 mg via INTRAVENOUS
  Filled 2017-01-08: qty 500

## 2017-01-08 MED ORDER — ALBUTEROL SULFATE (2.5 MG/3ML) 0.083% IN NEBU: 2.5000 mg | INHALATION_SOLUTION | RESPIRATORY_TRACT | Status: DC | PRN

## 2017-01-08 MED ORDER — PREDNISONE 10 MG PO TABS
5.0000 mg | ORAL_TABLET | Freq: Every day | ORAL | Status: DC
  Administered 2017-01-08 – 2017-01-10 (×3): 5 mg via ORAL
  Filled 2017-01-08 (×3): qty 1

## 2017-01-08 MED ORDER — IOPAMIDOL (ISOVUE-300) INJECTION 61%
75.0000 mL | Freq: Once | INTRAVENOUS | Status: AC | PRN
  Administered 2017-01-08: 75 mL via INTRAVENOUS

## 2017-01-08 MED ORDER — ACETAMINOPHEN 650 MG RE SUPP: 650.0000 mg | Freq: Four times a day (QID) | RECTAL | Status: DC | PRN

## 2017-01-08 MED ORDER — SODIUM CHLORIDE 0.9 % IV SOLN
INTRAVENOUS | Status: DC
  Administered 2017-01-08: 13:00:00 via INTRAVENOUS
  Administered 2017-01-09: 19:00:00 75 mL/h via INTRAVENOUS
  Administered 2017-01-10: 08:00:00 via INTRAVENOUS

## 2017-01-08 MED ORDER — MORPHINE SULFATE (PF) 4 MG/ML IV SOLN
4.0000 mg | Freq: Once | INTRAVENOUS | Status: AC
  Administered 2017-01-08: 4 mg via INTRAVENOUS
  Filled 2017-01-08: qty 1

## 2017-01-08 MED ORDER — HYDROCODONE-ACETAMINOPHEN 5-325 MG PO TABS
1.0000 | ORAL_TABLET | ORAL | Status: DC | PRN
  Administered 2017-01-08: 1 via ORAL
  Administered 2017-01-09: 12:00:00 2 via ORAL
  Filled 2017-01-08: qty 1
  Filled 2017-01-08: qty 2

## 2017-01-08 MED ORDER — ONDANSETRON HCL 4 MG/2ML IJ SOLN
4.0000 mg | Freq: Once | INTRAMUSCULAR | Status: AC
  Administered 2017-01-08: 4 mg via INTRAVENOUS
  Filled 2017-01-08: qty 2

## 2017-01-08 MED ORDER — DEXTROSE 5 % IV SOLN
500.0000 mg | INTRAVENOUS | Status: DC
  Administered 2017-01-09 – 2017-01-10 (×2): 500 mg via INTRAVENOUS
  Filled 2017-01-08 (×2): qty 500

## 2017-01-08 MED ORDER — ATORVASTATIN CALCIUM 20 MG PO TABS
10.0000 mg | ORAL_TABLET | Freq: Every day | ORAL | Status: DC
  Administered 2017-01-08 – 2017-01-10 (×3): 10 mg via ORAL
  Filled 2017-01-08 (×3): qty 1

## 2017-01-08 NOTE — H&P (Signed)
[facility] NAME: Knowledge Escandon    MR#:  [id number]  DATE OF BIRTH:  Apr 06, 1935  DATE OF ADMISSION:  01/08/2017  PRIMARY CARE PHYSICIAN: Olin Hauser, DO   REQUESTING/REFERRING PHYSICIAN: Lavonia Drafts, MD  CHIEF COMPLAINT:   Chief Complaint  Patient presents with  . Abdominal Pain   Right upper abdominal pain for 2 days HISTORY OF PRESENT ILLNESS:  Elijah Jackson  is a 82 y.o. male with a known history of Hypertension, hyperlipidemia, PE and prostate cancer. The patient presently ED with the above chief complaints. The abdominal pain is on RUQ, intermittent, exacerbated by deep breath. He denies any fever or chills, no cough or wheezing. He said he has chronic diarrhea for 3 years. He has history of pulmonary emboli, finished blood thinner 1 months ago. CT of the abdomen showed bilateral basilar opacity. He is treated with the Zithromax and Rocephin in the ED.  PAST MEDICAL HISTORY:   Past Medical History:  Diagnosis Date  . Dyslipidemia   . GERD (gastroesophageal reflux disease)   . Glaucoma   . Hyperlipidemia   . Hypertension   . Insomnia   . Myasthenia gravis ([place]) 09/08/2012  . Obesity   . Ocular myasthenia gravis ([place])   . Prostate cancer ([place])     PAST SURGICAL HISTORY:   Past Surgical History:  Procedure Laterality Date  . CATARACT EXTRACTION Bilateral   . CHOLECYSTECTOMY    . TRANSURETHRAL RESECTION OF PROSTATE      SOCIAL HISTORY:   Social History  Substance Use Topics  . Smoking status: Former Smoker    Quit date: 04/06/1970  . Smokeless tobacco: Never Used  . Alcohol use 0.0 oz/week     Comment: Consumes alcohol on occasion    FAMILY HISTORY:   Family History  Problem Relation Age of Onset  . Heart attack Father     DRUG ALLERGIES:  No Known Allergies  REVIEW OF SYSTEMS:   Review of Systems  Constitutional: Negative for chills, fever and malaise/fatigue.  HENT: Negative  for sore throat.   Eyes: Negative for blurred vision and double vision.  Respiratory: Negative for cough, hemoptysis, shortness of breath, wheezing and stridor.   Cardiovascular: Negative for chest pain, palpitations, orthopnea and leg swelling.  Gastrointestinal: Positive for abdominal pain and diarrhea. Negative for blood in stool, melena, nausea and vomiting.  Genitourinary: Negative for dysuria, flank pain and hematuria.  Musculoskeletal: Negative for back pain and joint pain.  Skin: Negative for rash.  Neurological: Negative for dizziness, sensory change, focal weakness, seizures, loss of consciousness, weakness and headaches.  Endo/Heme/Allergies: Negative for polydipsia.  Psychiatric/Behavioral: Negative for depression. The patient is not nervous/anxious.     MEDICATIONS AT HOME:   Prior to Admission medications   Medication Sig Start Date End Date Taking? Authorizing Provider  atorvastatin (LIPITOR) 10 MG tablet Take 10 mg by mouth daily.   Yes [provider]  latanoprost (XALATAN) 0.005 % ophthalmic solution Place 1 drop into both eyes at bedtime.   Yes [provider]  lisinopril (PRINIVIL,ZESTRIL) 40 MG tablet Take 40 mg by mouth daily.   Yes [provider]  omeprazole (PRILOSEC) 20 MG capsule Take 2 capsules (40 mg total) by mouth 2 (two) times daily before a meal. 04/17/16  Yes Rizwan, Eunice Blase, MD  predniSONE (DELTASONE) 5 MG tablet Take 5 mg by mouth daily. 05/15/14  Yes [provider]  ondansetron (ZOFRAN ODT) 4 MG disintegrating  tablet Take 1 tablet (4 mg total) by mouth every 8 (eight) hours as needed for nausea or vomiting. 12/24/16   Parks Ranger, Devonne Doughty, DO      VITAL SIGNS:  Blood pressure 119/63, pulse 75, temperature 98.2 F (36.8 C), temperature source Oral, resp. rate (!) 25, height 5\' 9"  (1.753 m), weight 195 lb (88.5 kg), SpO2 96 %.  PHYSICAL EXAMINATION:  Physical Exam  GENERAL:  82 y.o.-year-old patient lying in the  bed with no acute distress.  EYES: Pupils equal, round, reactive to light and accommodation. No scleral icterus. Extraocular muscles intact.  HEENT: Head atraumatic, normocephalic. Oropharynx and nasopharynx clear.  NECK:  Supple, no jugular venous distention. No thyroid enlargement, no tenderness.  LUNGS: Normal breath sounds bilaterally, no wheezing, no rales,rhonchi or crepitation. Mild basilar crackles. No use of accessory muscles of respiration.  CARDIOVASCULAR: S1, S2 normal. No murmurs, rubs, or gallops.  ABDOMEN: Soft, nontender, nondistended. Bowel sounds present. No organomegaly or mass.  EXTREMITIES: No pedal edema, cyanosis, or clubbing.  NEUROLOGIC: Cranial nerves II through XII are intact. Muscle strength 4/5 in all extremities. Sensation intact. Gait not checked.  PSYCHIATRIC: The patient is alert and oriented x 3.  SKIN: No obvious rash, lesion, or ulcer.   LABORATORY PANEL:   CBC  Recent Labs Lab 01/08/17 0656  WBC 12.9*  HGB 14.1  HCT 41.1  PLT 167   ------------------------------------------------------------------------------------------------------------------  Chemistries   Recent Labs Lab 01/08/17 0656  NA 138  K 4.0  CL 103  CO2 25  GLUCOSE 140*  BUN 27*  CREATININE 1.72*  CALCIUM 8.9  AST 25  ALT 23  ALKPHOS 60  BILITOT 1.8*   ------------------------------------------------------------------------------------------------------------------  Cardiac Enzymes No results for input(s): TROPONINI in the last 168 hours. ------------------------------------------------------------------------------------------------------------------  RADIOLOGY:  Ct Abdomen Pelvis W Contrast  Result Date: 01/08/2017 CLINICAL DATA:  Abdominal pain, prostate cancer, prior cholecystectomy EXAM: CT ABDOMEN AND PELVIS WITH CONTRAST TECHNIQUE: Multidetector CT imaging of the abdomen and pelvis was performed using the standard protocol following bolus administration of  intravenous contrast. CONTRAST:  33mL ISOVUE-300 IOPAMIDOL (ISOVUE-300) INJECTION 61% COMPARISON:  04/14/2016 FINDINGS: Lower chest: Patchy bilateral lower lobe opacities, suspicious for pneumonia. Hepatobiliary: Liver is within normal limits. Status post cholecystectomy. No intrahepatic or extrahepatic ductal dilatation. Pancreas: Within normal limits. Spleen: Within normal limits. Adrenals/Urinary Tract: Adrenal glands within normal limits. Kidneys are within normal limits.  No hydronephrosis. Bladder is within normal limits. Stomach/Bowel: Stomach notable for a small hiatal hernia. No evidence of bowel obstruction. Normal appendix (series 2/ image 58). Left colonic diverticulosis, without evidence of diverticulitis. Vascular/Lymphatic: No evidence of abdominal aortic aneurysm. Atherosclerotic calcifications of the abdominal aorta and branch vessels. No suspicious abdominopelvic lymphadenopathy. Reproductive: Brachytherapy seeds in the prostate. Other: No abdominopelvic ascites. Small fat containing right inguinal hernia (series 2/ image 81). Moderate fat containing left inguinal/femoral hernia (series 2/ image 85). Musculoskeletal: Degenerative changes of the visualized thoracolumbar spine. a probable benign bone island in the left parasymphyseal region (series 2/ image 87), unchanged since 2008. Additional tiny sclerotic foci in the and left iliac bone (series 2/image 62) and left L4 vertebral body (series 2/ image 48) are new from 2008 but similar to 2015, possibly reflecting additional bone islands. IMPRESSION: Patchy bilateral lower lobe opacities, suspicious for pneumonia. No evidence of bowel obstruction.  Normal appendix. Left colonic diverticulosis, without evidence of diverticulitis. Brachytherapy seeds in the prostate. No findings specific for metastatic disease. Additional ancillary findings as above. Electronically Signed   By: Bertis Ruddy  [place] Pink M.D.   On: 01/08/2017 10:08      IMPRESSION AND  PLAN:   CAP PNA. The patient will be admitted to medical floor. Continue this max and the Rocephin, follow-up blood cultures and CBC.  Acute renal failure due to dehydration. Hold lisinopril, start IV fluid and follow-up BMP. Elevated bilirubin. Unclear ideology, follow-up level. Hypertension. Hold lisinopril due to low side blood pressure. Hyperlipidemia. Continue Lipitor. Generalized weakness. Physical therapy evaluation. All the records are reviewed and case discussed with ED provider. Management plans discussed with the patient, her daughter and they are in agreement.  CODE STATUS: Full code  TOTAL TIME TAKING CARE OF THIS PATIENT: 57 minutes.    Demetrios Loll M.D on 01/08/2017 at 11:06 AM  Between 7am to 6pm - Pager - [phone]  After 6pm go to [email or web address] - Proofreader  [facility]  Office  [phone]  CC: Primary care physician; Olin Hauser, DO   Note: This dictation was prepared with Dragon dictation along with smaller phrase technology. Any transcriptional errors that result from this process are unin

## 2017-01-08 NOTE — ED Provider Notes (Signed)
[facility] Emergency Department Provider Note   ____________________________________________    I have reviewed the triage vital signs and the nursing notes.   HISTORY  Chief Complaint Abdominal Pain     HPI Elijah Advani Sr. is a 82 y.o. male Who presents with complaints of abdominal pain. Patient reports over the last 2-1/2 days he has had constant moderate to severe right upper and right sided abdominal pain. Denies nausea or vomiting. Has not had a bowel movement in 2 days. Only history of abdominal surgery is a cholecystectomy. No fevers or chills. Saw his PCP 3 weeks ago for a viral gastroenteritis which Had resolved. He is on blood thinners for PE.has not taken anything for this.   Past Medical History:  Diagnosis Date  . Dyslipidemia   . GERD (gastroesophageal reflux disease)   . Glaucoma   . Hyperlipidemia   . Hypertension   . Insomnia   . Myasthenia gravis ([place]) 09/08/2012  . Obesity   . Ocular myasthenia gravis ([place])   . Prostate cancer [facility])     Patient Active Problem List   Diagnosis Date Noted  . Excessive daytime sleepiness 12/24/2016  . Diarrhea 12/24/2016  . Squamous cell carcinoma 09/07/2016  . Cavitating mass of lung 04/17/2016  . Gastritis 04/17/2016  . Pulmonary emboli ([place]) 04/15/2016  . Essential hypertension 04/14/2016  . HLD (hyperlipidemia) 04/14/2016  . GERD (gastroesophageal reflux disease) 04/14/2016  . Hypokalemia 04/14/2016  . Weakness 04/14/2016  . Postcholecystectomy diarrhea 09/14/2013  . Dizziness and giddiness 08/02/2013  . Chronic cholecystitis 01/25/2013  . Myasthenia gravis ([place]) 09/08/2012  . Abdominal pain, RUQ 09/08/2012  . Pancreatitis, acute 09/08/2012    Past Surgical History:  Procedure Laterality Date  . CATARACT EXTRACTION Bilateral   . CHOLECYSTECTOMY    . TRANSURETHRAL RESECTION OF PROSTATE      Prior to Admission medications   Medication Sig Start Date End Date Taking?  Authorizing Provider  atorvastatin (LIPITOR) 10 MG tablet Take 10 mg by mouth daily.   Yes [provider]  latanoprost (XALATAN) 0.005 % ophthalmic solution Place 1 drop into both eyes at bedtime.   Yes [provider]  lisinopril (PRINIVIL,ZESTRIL) 40 MG tablet Take 40 mg by mouth daily.   Yes [provider]  omeprazole (PRILOSEC) 20 MG capsule Take 2 capsules (40 mg total) by mouth 2 (two) times daily before a meal. 04/17/16  Yes Rizwan, Eunice Blase, MD  predniSONE (DELTASONE) 5 MG tablet Take 5 mg by mouth daily. 05/15/14  Yes [provider]  ondansetron (ZOFRAN ODT) 4 MG disintegrating tablet Take 1 tablet (4 mg total) by mouth every 8 (eight) hours as needed for nausea or vomiting. 12/24/16   Olin Hauser, DO     Allergies Patient has no known allergies.  Family History  Problem Relation Age of Onset  . Heart attack Father     Social History Social History  Substance Use Topics  . Smoking status: Former Smoker    Quit date: 04/06/1970  . Smokeless tobacco: Never Used  . Alcohol use 0.0 oz/week     Comment: Consumes alcohol on occasion    Review of Systems  Constitutional: No fever/chills Eyes: No visual changes.  ENT: No sore throat. Cardiovascular: Denies chest pain. Respiratory: Denies shortness of breath. Gastrointestinal: as above Genitourinary: Negative for dysuria. Musculoskeletal: Negative for back pain. Skin: Negative for rash. Neurological: Negative for headaches    ____________________________________________   PHYSICAL EXAM:  VITAL SIGNS: ED Triage  Vitals  Enc Vitals Group     BP 01/08/17 0649 (!) 105/59     Pulse Rate 01/08/17 0649 88     Resp 01/08/17 0649 (!) 24     Temp 01/08/17 0649 98.2 F (36.8 C)     Temp Source 01/08/17 0649 Oral     SpO2 01/08/17 0649 96 %     Weight 01/08/17 0654 88.5 kg (195 lb)     Height 01/08/17 0654 1.753 m (5\' 9" )     Head Circumference --      Peak Flow --      Pain  Score 01/08/17 0649 10     Pain Loc --      Pain Edu? --      Excl. in [place]? --     Constitutional: Alert and oriented. No acute distress. Pleasant and interactive . Nose: No congestion/rhinnorhea. Mouth/Throat: Mucous membranes are moist.   Neck:  Painless ROM Cardiovascular: Normal rate, regular rhythm. Grossly normal heart sounds.  Good peripheral circulation. Respiratory: Normal respiratory effort.  No retractions. Lungs CTAB. Gastrointestinal:mild distention, diffuselymildly tender. No CVA tenderness. Genitourinary: deferred Musculoskeletal: Warm and well perfused Neurologic:  Normal speech and language. No gross focal neurologic deficits are appreciated.  Skin:  Skin is warm, dry and intact. No rash noted. Psychiatric: Mood and affect are normal. Speech and behavior are normal.  ____________________________________________   LABS (all labs ordered are listed, but only abnormal results are displayed)  Labs Reviewed  COMPREHENSIVE METABOLIC PANEL - Abnormal; Notable for the following:       Result Value   Glucose, Bld 140 (*)    BUN 27 (*)    Creatinine, Ser 1.72 (*)    Total Bilirubin 1.8 (*)    GFR calc non Af Amer 36 (*)    GFR calc Af Amer 41 (*)    All other components within normal limits  CBC - Abnormal; Notable for the following:    WBC 12.9 (*)    RBC 4.29 (*)    All other components within normal limits  CULTURE, BLOOD (ROUTINE X 2)  CULTURE, BLOOD (ROUTINE X 2)  LIPASE, BLOOD  APTT  PROTIME-INR  URINALYSIS, COMPLETE (UACMP) WITH MICROSCOPIC  LACTIC ACID, PLASMA  LACTIC ACID, PLASMA   ____________________________________________  EKG  None ____________________________________________  RADIOLOGY  CT abdomen and pelvis shows no acute abnormality of the abdomen but is consistent with bilateral lower lobe pneumonia ____________________________________________   PROCEDURES  Procedure(s) performed: No    Critical Care performed:  No ____________________________________________   INITIAL IMPRESSION / ASSESSMENT AND PLAN / ED COURSE  Pertinent labs & imaging results that were available during my care of the patient were reviewed by me and considered in my medical decision making (see chart for details).  Patient presents with complaints of abdominal pain as noted above. Differential diagnosis includes small bowel obstruction, Colitis/enteritis, appendicitis.  Medical record reviewed which demonstrates a history of PE/cavitary lung infection, doubt this is relevant for today's presentation.  ----------------------------------------- 10:48 AM on 01/08/2017 -----------------------------------------  CT abdomen pelvis shows no acute abdominal issues but does show bilateral lower lobe pneumonia.this is likely the cause of his abdominal pain, blood cultures and lactic acid and antibiotics ordered. Admitted to the hospitalist service    ____________________________________________   FINAL CLINICAL IMPRESSION(S) / ED DIAGNOSES  Final diagnoses:  Pneumonia of both lower lobes due to infectious organism [facility])      NEW MEDICATIONS STARTED DURING THIS VISIT:  New Prescriptions   No  medications on file     Note:  This document was prepared using Dragon voice recognition software and may include unintentional dictation errors.    Lavonia Drafts, MD 01/08/17 1049

## 2017-01-08 NOTE — Progress Notes (Signed)
Pharmacy Antibiotic Note  Garek Schuneman Sr. is a 82 y.o. male admitted on 01/08/2017 with cap.  Pharmacy has been consulted for zithromax and rocephin dosing.  Plan: Zithromax 500 mg IV Q24H + Rocephin 1 gm IV Q24H  Height: 5\' 9"  (175.3 cm) Weight: 192 lb 14.4 oz (87.5 kg) IBW/kg (Calculated) : 70.7  Temp (24hrs), Avg:98.5 F (36.9 C), Min:98.2 F (36.8 C), Max:98.7 F (37.1 C)   Recent Labs Lab 01/08/17 0656 01/08/17 1137  WBC 12.9*  --   CREATININE 1.72*  --   LATICACIDVEN  --  0.9    Estimated Creatinine Clearance: 37.5 mL/min (A) (by C-G formula based on SCr of 1.72 mg/dL (H)).    No Known Allergies  Thank you for allowing pharmacy to be a part of this patient's care.  Laural Benes 01/08/2017 12:41 PM

## 2017-01-08 NOTE — ED Notes (Signed)
Notified Elijah Jackson about pt needing IV, states he will follow up.

## 2017-01-08 NOTE — ED Notes (Signed)
2x IV attempt unsuccessful, will follow up.

## 2017-01-08 NOTE — Progress Notes (Signed)
Initial Nutrition Assessment  DOCUMENTATION CODES:   Not applicable  INTERVENTION:   Ensure Enlive po BID, each supplement provides 350 kcal and 20 grams of protein  Yogurt with breakfast trays   Diet education on soluble fiber- suspect possible dumping syndrome; pt may benefit from GI consult    NUTRITION DIAGNOSIS:   Inadequate oral intake related to acute illness (CAP and severe diarrhea ) as evidenced by per patient/family report  GOAL:   Patient will meet greater than or equal to 90% of their needs  MONITOR:   PO intake, Supplement acceptance, Labs, Weight trends  REASON FOR ASSESSMENT:   Malnutrition Screening Tool    ASSESSMENT:    82 y.o. male with a known history of Hypertension, hyperlipidemia, PE and prostate cancer. The patient presently ED with the above chief complaints. The abdominal pain is on RUQ, intermittent, exacerbated by deep breath. He denies any fever or chills, no cough or wheezing. He said he has chronic diarrhea for 3 years. He has history of pulmonary emboli, finished blood thinner 1 months ago. CT of the abdomen showed bilateral basilar opacity. He is treated with the Zithromax and Rocephin in the ED.   Met with pt in room today. Pt reports chronic diarrhea for the past four years that started after a cholecystectomy. Pt reports that his diarrhea is most severe in the mornings after he has eaten his first meal. Pt reports diarrhea will occur almost immediately after eating. He describes the diarrhea as watery and reports that he doesn't always have time to get to the bathroom. Pt reports diarrhea occurs at least once per day. Pt also reports intermittent abdominal pain and nausea accompanied by sweating episodes and overall weakness. RD suspects that pt could be suffering from dumping syndrome. It appears that the pt was seen for similar complaints in 09/2013 and was started on Questran at this time. RD unable to find any documentation of wether or not  Questran improved his symptoms; it appears pt did not follow up. Pt reports his usual appetite and oral intake up until a few days pta. Pt reports that he does not eat a lot in general. Per chart, pt is weight stable. Pt initiated on heart healthy diet today. RD discussed increasing soluble fiber in pt's diet to slow GI transit time and help improve his diarrhea. RD provided a list of foods containing soluble fiber. Pt does drink Ensure at home; RD will order. RD also discussed probiotics; recommended daily yogurt; will send up on pt's trays.    Medications reviewed and include: heparin, protonix, prednisone, NaCl _0 /hr, azithromycin, ceftriaxone, morphine  Labs reviewed: BUN 27(H), creat 1.72(H), tbili 1.8(H) Wbc- 12.9(H)  Nutrition-Focused physical exam completed. Findings are no fat depletion, no muscle depletion, and no edema.   Diet Order:  Diet Heart Room service appropriate? Yes; Fluid consistency: Thin  Skin:  Reviewed, no issues  Last BM:  PTA  Height:   Ht Readings from Last 1 Encounters:  01/08/17 _1  (1.753 m)    Weight:   Wt Readings from Last 1 Encounters:  01/08/17 192 lb 14.4 oz (87.5 kg)    Ideal Body Weight:  72.7 kg  BMI:  Body mass index is 28.49 kg/m.  Estimated Nutritional Needs:   Kcal:  2000-2300kcal/day   Protein:  87-96g/day   Fluid:  >2L/day   EDUCATION NEEDS:   Education needs addressed  Koleen Distance MS, RD, LDN Pager #[phone] After Hours Pager: [phone]

## 2017-01-08 NOTE — ED Notes (Signed)
Pt states he is on a blood thinner however states he is not sure of the name of medication due to hx of blood clots.

## 2017-01-08 NOTE — ED Triage Notes (Signed)
Per EMS, pt reports abd pain that began Thursday morning and has progressively gotten worse. EMS reports on their arrival pt was diaphoretic and pale. Pt states RUQ pain that radiates to upper middle abdomen. Pt denies GI hx, pt A&O at this time and able to answer questions. Pt states abd feels tighter than normal.

## 2017-01-08 NOTE — ED Notes (Signed)
Pt placed on 2L of oxygen for comfort and due to resp rate 28, pt states difficulty taking a deep breath due to abd pain.

## 2017-01-09 LAB — COMPREHENSIVE METABOLIC PANEL
ALK PHOS: 89 U/L (ref 38–126)
ALT: 91 U/L — AB (ref 17–63)
ANION GAP: 7 (ref 5–15)
AST: 66 U/L — ABNORMAL HIGH (ref 15–41)
Albumin: 3 g/dL — ABNORMAL LOW (ref 3.5–5.0)
BUN: 20 mg/dL (ref 6–20)
CALCIUM: 8 mg/dL — AB (ref 8.9–10.3)
CO2: 23 mmol/L (ref 22–32)
CREATININE: 1.25 mg/dL — AB (ref 0.61–1.24)
Chloride: 105 mmol/L (ref 101–111)
GFR, EST NON AFRICAN AMERICAN: 53 mL/min — AB (ref >60)
Glucose, Bld: 181 mg/dL — ABNORMAL HIGH (ref 65–99)
Potassium: 4 mmol/L (ref 3.5–5.1)
SODIUM: 135 mmol/L (ref 135–145)
Total Bilirubin: 0.6 mg/dL (ref 0.3–1.2)
Total Protein: 5.9 g/dL — ABNORMAL LOW (ref 6.5–8.1)

## 2017-01-09 LAB — BASIC METABOLIC PANEL
Anion gap: 8 (ref 5–15)
BUN: 21 mg/dL — AB (ref 6–20)
CALCIUM: 8 mg/dL — AB (ref 8.9–10.3)
CO2: 24 mmol/L (ref 22–32)
Chloride: 105 mmol/L (ref 101–111)
Creatinine, Ser: 1.18 mg/dL (ref 0.61–1.24)
GFR calc Af Amer: >60 mL/min (ref >60)
GFR, EST NON AFRICAN AMERICAN: 56 mL/min — AB (ref >60)
Glucose, Bld: 119 mg/dL — ABNORMAL HIGH (ref 65–99)
Potassium: 3.7 mmol/L (ref 3.5–5.1)
Sodium: 137 mmol/L (ref 135–145)

## 2017-01-09 LAB — CBC
HEMATOCRIT: 35.9 % — AB (ref 40.0–52.0)
Hemoglobin: 12.7 g/dL — ABNORMAL LOW (ref 13.0–18.0)
MCH: 33.2 pg (ref 26.0–34.0)
MCHC: 35.3 g/dL (ref 32.0–36.0)
MCV: 94.1 fL (ref 80.0–100.0)
Platelets: 130 10*3/uL — ABNORMAL LOW (ref 150–440)
RBC: 3.81 MIL/uL — ABNORMAL LOW (ref 4.40–5.90)
RDW: 13.8 % (ref 11.5–14.5)
WBC: 10.7 10*3/uL — ABNORMAL HIGH (ref 3.8–10.6)

## 2017-01-09 LAB — BILIRUBIN, TOTAL: BILIRUBIN TOTAL: 1.3 mg/dL — AB (ref 0.3–1.2)

## 2017-01-09 LAB — STREP PNEUMONIAE URINARY ANTIGEN: STREP PNEUMO URINARY ANTIGEN: NEGATIVE

## 2017-01-09 NOTE — Progress Notes (Signed)
[facility] NAME: Elijah Jackson    MR#:  [id number]  DATE OF BIRTH:  Feb 26, 1935  SUBJECTIVE:  CHIEF COMPLAINT:  Patient is feeling better cough is improving shortness of breath is better  REVIEW OF SYSTEMS:  CONSTITUTIONAL: No fever, fatigue or weakness.  EYES: No blurred or double vision.  EARS, NOSE, AND THROAT: No tinnitus or ear pain.  RESPIRATORY: Reports intermittent episodes of cough, shortness of breath, denies wheezing or hemoptysis.  CARDIOVASCULAR: No chest pain, orthopnea, edema.  GASTROINTESTINAL: No nausea, vomiting, diarrhea or abdominal pain.  GENITOURINARY: No dysuria, hematuria.  ENDOCRINE: No polyuria, nocturia,  HEMATOLOGY: No anemia, easy bruising or bleeding SKIN: No rash or lesion. MUSCULOSKELETAL: No joint pain or arthritis.   NEUROLOGIC: No tingling, numbness, weakness.  PSYCHIATRY: No anxiety or depression.   DRUG ALLERGIES:  No Known Allergies  VITALS:  Blood pressure (!) 113/48, pulse 82, temperature 99.7 F (37.6 C), temperature source Oral, resp. rate 18, height 5\' 9"  (1.753 m), weight 87.5 kg (192 lb 14.4 oz), SpO2 93 %.  PHYSICAL EXAMINATION:  GENERAL:  82 y.o.-year-old patient lying in the bed with no acute distress.  EYES: Pupils equal, round, reactive to light and accommodation. No scleral icterus. Extraocular muscles intact.  HEENT: Head atraumatic, normocephalic. Oropharynx and nasopharynx clear.  NECK:  Supple, no jugular venous distention. No thyroid enlargement, no tenderness.  LUNGS: Normal breath sounds bilaterally, no wheezing, rales,rhonchi or crepitation. No use of accessory muscles of respiration.  CARDIOVASCULAR: S1, S2 normal. No murmurs, rubs, or gallops.  ABDOMEN: Soft, nontender, nondistended. Bowel sounds present. No organomegaly or mass.  EXTREMITIES: No pedal edema, cyanosis, or clubbing.  NEUROLOGIC: Cranial nerves II through XII are intact. Muscle strength 5/5 in  all extremities. Sensation intact. Gait not checked.  PSYCHIATRIC: The patient is alert and oriented x 3.  SKIN: No obvious rash, lesion, or ulcer.    LABORATORY PANEL:   CBC  Recent Labs Lab 01/09/17 0438  WBC 10.7*  HGB 12.7*  HCT 35.9*  PLT 130*   ------------------------------------------------------------------------------------------------------------------  Chemistries   Recent Labs Lab 01/08/17 0656 01/09/17 0438  NA 138 137  K 4.0 3.7  CL 103 105  CO2 25 24  GLUCOSE 140* 119*  BUN 27* 21*  CREATININE 1.72* 1.18  CALCIUM 8.9 8.0*  MG 2.1  --   AST 25  --   ALT 23  --   ALKPHOS 60  --   BILITOT 1.8* 1.3*   ------------------------------------------------------------------------------------------------------------------  Cardiac Enzymes No results for input(s): TROPONINI in the last 168 hours. ------------------------------------------------------------------------------------------------------------------  RADIOLOGY:  Dg Chest 1 View  Result Date: 01/08/2017 CLINICAL DATA:  Pt has had constant, moderate to severe right upper and right sided abdominal pain. Denies nausea or vomiting; C/o SOB Concern for pneumonia HTN, former smoker EXAM: CHEST 1 VIEW COMPARISON:  CT 07/30/2016 FINDINGS: Normal cardiac silhouette. There is fine airspace disease in LEFT RIGHT lower lobe. Small effusions. No pneumothorax. No acute osseous abnormality. IMPRESSION: Fine bibasilar airspace disease suggests mild pulmonary edema/interstitial edema. Small effusions. Electronically Signed   By: Suzy Bouchard M.D.   On: 01/08/2017 11:15   Ct Abdomen Pelvis W Contrast  Result Date: 01/08/2017 CLINICAL DATA:  Abdominal pain, prostate cancer, prior cholecystectomy EXAM: CT ABDOMEN AND PELVIS WITH CONTRAST TECHNIQUE: Multidetector CT imaging of the abdomen and pelvis was performed using the standard protocol following bolus administration of intravenous contrast. CONTRAST:  48mL  ISOVUE-300 IOPAMIDOL (ISOVUE-300) INJECTION 61%  COMPARISON:  04/14/2016 FINDINGS: Lower chest: Patchy bilateral lower lobe opacities, suspicious for pneumonia. Hepatobiliary: Liver is within normal limits. Status post cholecystectomy. No intrahepatic or extrahepatic ductal dilatation. Pancreas: Within normal limits. Spleen: Within normal limits. Adrenals/Urinary Tract: Adrenal glands within normal limits. Kidneys are within normal limits.  No hydronephrosis. Bladder is within normal limits. Stomach/Bowel: Stomach notable for a small hiatal hernia. No evidence of bowel obstruction. Normal appendix (series 2/ image 58). Left colonic diverticulosis, without evidence of diverticulitis. Vascular/Lymphatic: No evidence of abdominal aortic aneurysm. Atherosclerotic calcifications of the abdominal aorta and branch vessels. No suspicious abdominopelvic lymphadenopathy. Reproductive: Brachytherapy seeds in the prostate. Other: No abdominopelvic ascites. Small fat containing right inguinal hernia (series 2/ image 81). Moderate fat containing left inguinal/femoral hernia (series 2/ image 85). Musculoskeletal: Degenerative changes of the visualized thoracolumbar spine. a probable benign bone island in the left parasymphyseal region (series 2/ image 87), unchanged since 2008. Additional tiny sclerotic foci in the and left iliac bone (series 2/image 62) and left L4 vertebral body (series 2/ image 48) are new from 2008 but similar to 2015, possibly reflecting additional bone islands. IMPRESSION: Patchy bilateral lower lobe opacities, suspicious for pneumonia. No evidence of bowel obstruction.  Normal appendix. Left colonic diverticulosis, without evidence of diverticulitis. Brachytherapy seeds in the prostate. No findings specific for metastatic disease. Additional ancillary findings as above. Electronically Signed   By: Julian Hy M.D.   On: 01/08/2017 10:08    EKG:   Orders placed or performed during the hospital  encounter of 04/14/16  . ED EKG  . ED EKG  . EKG  . EKG 12-Lead  . EKG 12-Lead    ASSESSMENT AND PLAN:     #  PNA.-Community-acquired Clinically improving Continue azithromycin and  Rocephin, follow-up blood cultures and CBC.  #Acute renal failure due to dehydration. Hold lisinopril, continue IV fluid   follow-up BMP, creatinine 1.7-1.18  #Elevated bilirubin. Unclear ideology Could be an acute phase reactant, trending down Total bili at 1.3 today, very close to the normal range will repeat in a.m.  Hypertension. Hold lisinopril due to low side blood pressure.  Hyperlipidemia. Continue Lipitor.  Generalized weakness. Physical therapy evaluation.   All the records are reviewed and case discussed with Care Management/Social Workerr. Management plans discussed with the patient, family and they are in agreement.  CODE STATUS: fc   TOTAL TIME TAKING CARE OF THIS PATIENT: 36  minutes.   POSSIBLE D/C IN 1-2  DAYS, DEPENDING ON CLINICAL CONDITION.  Note: This dictation was prepared with Dragon dictation along with smaller phrase technology. Any transcriptional errors that result from this process are unintentional.   Nicholes Mango M.D on 01/09/2017 at 1:27 PM  Between 7am to 6pm - Pager - [phone] After 6pm go to [email or web address] - password [email or web address] [facility]  Office  [phone]  CC: Primary care physician; Olin Hauser, DO

## 2017-01-09 NOTE — Evaluation (Signed)
Physical Therapy Evaluation Patient Details Name: Elijah Wisehart Sr. MRN: [id number] DOB: 1934/06/26 Today's Date: 01/09/2017   History of Present Illness  82 yo male with onset of PNA was admitted, has R ribcage pain and ongoing issues with myasthenia gravis with LE mm fatigue.  PMHx:  HLD, prostate CA, PE, HTN, GERD, glaucoma,   Clinical Impression  Pt was able to walk but initially had to get nursing to give him permission for O2 via nasal cannula. Also signficant pain with his ribcage due to his PNA, and notes his LE weakness that comes on at times with effort from myasthenia gravis.  Due to his complications will benefit from PT with [facility] services and then can be monitored for his O2 sats, to follow him regarding strengthening with Myasthenia Gravis and to set a follow up plan that works with his intention to go back to work.  Follow acutely for strengthening and control of gait with vitals monitored.    Follow Up Recommendations Home health PT;Supervision for mobility/OOB    Equipment Recommendations  Rolling walker with 5" wheels (if walker not in good shape)    Recommendations for Other Services       Precautions / Restrictions Precautions Precautions: Fall (IV ) Restrictions Weight Bearing Restrictions: No      Mobility  Bed Mobility Overal bed mobility: Needs Assistance Bed Mobility: Supine to Sit     Supine to sit: Min guard     General bed mobility comments: using bed rail and extra time  Transfers Overall transfer level: Needs assistance Equipment used: Rolling walker (2 wheeled);1 person hand held assist Transfers: Sit to/from Stand Sit to Stand: Min guard         General transfer comment: talked with pt about his hand placement and controlling descent to sit  Ambulation/Gait Ambulation/Gait assistance: Min guard Ambulation Distance (Feet): 150 Feet Assistive device: Rolling walker (2 wheeled);1 person hand held assist Gait Pattern/deviations:  Step-through pattern;Step-to pattern;Decreased stride length;Wide base of support;Trunk flexed Gait velocity: reduced Gait velocity interpretation: Below normal speed for age/gender General Gait Details: slow pace due to his bursts of pain from PNA and then talked with him about pacing due to O2 sats  Stairs            Wheelchair Mobility    Modified Rankin (Stroke Patients Only)       Balance Overall balance assessment: Needs assistance Sitting-balance support: Feet supported Sitting balance-Leahy Scale: Good     Standing balance support: Bilateral upper extremity supported Standing balance-Leahy Scale: Fair Standing balance comment: less than fair standing dynamic balance                             Pertinent Vitals/Pain Pain Assessment: Faces Faces Pain Scale: Hurts whole lot Pain Location: R ribcage with sporadic bursts of pain Pain Descriptors / Indicators: Sharp;Shooting Pain Intervention(s): Limited activity within patient's tolerance;Monitored during session;Premedicated before session;Repositioned    Home Living Family/patient expects to be discharged to:: Private residence Living Arrangements: Spouse/significant other Available Help at Discharge: Family;Available PRN/intermittently Type of Home: House Home Access: Stairs to enter Entrance Stairs-Rails: Right Entrance Stairs-Number of Steps: 2 Home Layout: One level Home Equipment: Walker - 2 wheels;Cane - single point;Crutches      Prior Function Level of Independence: Independent         Comments: Pt works full time and is typically out of the house at 730 AM     Hand  Dominance   Dominant Hand: Right    Extremity/Trunk Assessment   Upper Extremity Assessment Upper Extremity Assessment: Overall WFL for tasks assessed    Lower Extremity Assessment Lower Extremity Assessment: Overall WFL for tasks assessed    Cervical / Trunk Assessment Cervical / Trunk Assessment: Normal   Communication   Communication: No difficulties  Cognition Arousal/Alertness: Awake/alert Behavior During Therapy: WFL for tasks assessed/performed Overall Cognitive Status: Within Functional Limits for tasks assessed                                        General Comments      Exercises     Assessment/Plan    PT Assessment Patient needs continued PT services  PT Problem List Decreased strength;Decreased range of motion;Decreased activity tolerance;Decreased balance;Decreased mobility;Decreased coordination;Decreased knowledge of use of DME;Decreased safety awareness;Cardiopulmonary status limiting activity;Obesity;Pain       PT Treatment Interventions DME instruction;Gait training;Stair training;Functional mobility training;Therapeutic activities;Therapeutic exercise;Balance training;Neuromuscular re-education;Patient/family education    PT Goals (Current goals can be found in the Care Plan section)  Acute Rehab PT Goals Patient Stated Goal: to get back to work and get home today PT Goal Formulation: With patient Time For Goal Achievement: 01/23/17 Potential to Achieve Goals: Good    Frequency Min 2X/week   Barriers to discharge Inaccessible home environment;Decreased caregiver support wife works, has stairs to enter house    Co-evaluation               AM-PAC PT "6 Clicks" Daily Activity  Outcome Measure Difficulty turning over in bed (including adjusting bedclothes, sheets and blankets)?: A Little Difficulty moving from lying on back to sitting on the side of the bed? : A Lot Difficulty sitting down on and standing up from a chair with arms (e.g., wheelchair, bedside commode, etc,.)?: Unable Help needed moving to and from a bed to chair (including a wheelchair)?: A Little Help needed walking in hospital room?: A Little Help needed climbing 3-5 steps with a railing? : A Lot 6 Click Score: 14    End of Session Equipment Utilized During  Treatment: Gait belt;Oxygen Activity Tolerance: Patient tolerated treatment well;Patient limited by fatigue;Treatment limited secondary to medical complications (Comment) (O2 sats pregait w/o O2 were 87% then 97% with 2L,92% post) Patient left: in chair;with call bell/phone within reach Nurse Communication: Mobility status;Other (comment) (talked about O2 sats to get permission for O2 use) PT Visit Diagnosis: Unsteadiness on feet (R26.81);Muscle weakness (generalized) (M62.81);Difficulty in walking, not elsewhere classified (R26.2)    Time: 1610-9604 PT Time Calculation (min) (ACUTE ONLY): 50 min   Charges:   PT Evaluation $PT Eval High Complexity: 1 High PT Treatments $Gait Training: 8-22 mins $Therapeutic Exercise: 8-22 mins   PT G Codes:   PT G-Codes **NOT FOR INPATIENT CLASS** Functional Assessment Tool Used: AM-PAC 6 Clicks Basic Mobility    Ramond Dial 01/09/2017, 1:25 PM   Mee Hives, PT MS Acute Rehab Dept. Number: [place] [facility] [place]

## 2017-01-10 MED ORDER — AMOXICILLIN-POT CLAVULANATE 500-125 MG PO TABS: 1.0000 | ORAL_TABLET | Freq: Three times a day (TID) | ORAL | 0 refills | Status: AC

## 2017-01-10 MED ORDER — ACETAMINOPHEN 325 MG PO TABS: 325.0000 mg | ORAL_TABLET | Freq: Four times a day (QID) | ORAL | Status: DC | PRN

## 2017-01-10 MED ORDER — ENSURE ENLIVE PO LIQD: 237.0000 mL | Freq: Two times a day (BID) | ORAL | 0 refills | Status: DC

## 2017-01-10 MED ORDER — AMOXICILLIN-POT CLAVULANATE 500-125 MG PO TABS: 1.0000 | ORAL_TABLET | Freq: Three times a day (TID) | ORAL | 0 refills | Status: DC

## 2017-01-10 MED ORDER — LISINOPRIL 40 MG PO TABS: 20.0000 mg | ORAL_TABLET | Freq: Every day | ORAL | 0 refills | Status: DC

## 2017-01-10 MED ORDER — SENNOSIDES-DOCUSATE SODIUM 8.6-50 MG PO TABS
1.0000 | ORAL_TABLET | Freq: Every evening | ORAL | Status: DC | PRN
Start: 2017-01-10 — End: 2021-05-07

## 2017-01-10 NOTE — Progress Notes (Signed)
Pnt has rested this evening. Pnt has denied any pain or discomfort when assessed overnight. No issues or concerns with breathing overnight. Pnt has used oxygen as needed. Vital signs have remained stable. Bed low, locked and call bell in reach. Pnt appropriately call for assistance when needed.

## 2017-01-10 NOTE — Progress Notes (Signed)
Discharge instructions given and went over with patient at bedside. Prescriptions given and reviewed. All questions answered. Patient discharged home with wife via wheelchair by nursing staff. Madlyn Frankel, RN

## 2017-01-10 NOTE — Discharge Summary (Addendum)
[facility] NAME: Elijah Jackson    MR#:  [id number]  DATE OF BIRTH:  1934-07-20  DATE OF ADMISSION:  01/08/2017 ADMITTING PHYSICIAN: Demetrios Loll, MD  DATE OF DISCHARGE: 01/10/17  PRIMARY CARE PHYSICIAN: Olin Hauser, DO    ADMISSION DIAGNOSIS:  Pneumonia of both lower lobes due to infectious organism ([place]) [J18.1]  DISCHARGE DIAGNOSIS:  Active Problems:   Pneumonia   SECONDARY DIAGNOSIS:   Past Medical History:  Diagnosis Date  . Dyslipidemia   . GERD (gastroesophageal reflux disease)   . Glaucoma   . Hyperlipidemia   . Hypertension   . Insomnia   . Myasthenia gravis ([place]) 09/08/2012  . Obesity   . Ocular myasthenia gravis ([place])   . Prostate cancer [facility])     HOSPITAL COURSE:   HPI Elijah Jackson  is a 82 y.o. male with a known history of Hypertension, hyperlipidemia, PE and prostate cancer. The patient presently ED with the above chief complaints. The abdominal pain is on RUQ, intermittent, exacerbated by deep breath. He denies any fever or chills, no cough or wheezing. He said he has chronic diarrhea for 3 years. He has history of pulmonary emboli, finished blood thinner 1 months ago. CT of the abdomen showed bilateral basilar opacity. He is treated with the Zithromax and Rocephin in the ED.   #  PNA.-Community-acquired Clinically improving with  azithromycin and  Rocephin, discharge with by mouth Augmentin Neg blood cultures   #Acute renal failure due to dehydration. Held lisinopril, improved with  IV fluid  Discharge patient home with the low-dose lisinopril 20 mg creatinine 1.7-1.18  #Elevated bilirubin. Unclear ideology Could be an acute phase reactant, trending down Total bili at 0.6 today, normal range  Patient wants to follow-up with gastroenterology as an outpatient  Hypertension. Resume  lisinopril at low dose 20 mg    Hyperlipidemia. Continue Lipitor.  Generalized weakness.  Physical therapy evaluated patient- recommended home health PT  DISCHARGE CONDITIONS:   stable  CONSULTS OBTAINED:  Treatment Team:  Jonathon Bellows, MD   PROCEDURES  NONE   DRUG ALLERGIES:  No Known Allergies  DISCHARGE MEDICATIONS:   Current Discharge Medication List    START taking these medications   Details  acetaminophen (TYLENOL) 325 MG tablet Take 1 tablet (325 mg total) by mouth every 6 (six) hours as needed for mild pain (or Fever >/= 101).    amoxicillin-clavulanate (AUGMENTIN) 500-125 MG tablet Take 1 tablet (500 mg total) by mouth 3 (three) times daily. Qty: 15 tablet, Refills: 0    feeding supplement, ENSURE ENLIVE, (ENSURE ENLIVE) LIQD Take 237 mLs by mouth 2 (two) times daily between meals. Qty: 60 Bottle, Refills: 0    senna-docusate (SENOKOT-S) 8.6-50 MG tablet Take 1 tablet by mouth at bedtime as needed for mild constipation.      CONTINUE these medications which have CHANGED   Details  lisinopril (PRINIVIL,ZESTRIL) 40 MG tablet Take 0.5 tablets (20 mg total) by mouth daily. Qty: 30 tablet, Refills: 0      CONTINUE these medications which have NOT CHANGED   Details  atorvastatin (LIPITOR) 10 MG tablet Take 10 mg by mouth daily.    latanoprost (XALATAN) 0.005 % ophthalmic solution Place 1 drop into both eyes at bedtime.    omeprazole (PRILOSEC) 20 MG capsule Take 2 capsules (40 mg total) by mouth 2 (two) times daily before a meal. Qty: 60 capsule, Refills: 0    predniSONE (DELTASONE) 5  MG tablet Take 5 mg by mouth daily.    ondansetron (ZOFRAN ODT) 4 MG disintegrating tablet Take 1 tablet (4 mg total) by mouth every 8 (eight) hours as needed for nausea or vomiting. Qty: 30 tablet, Refills: 1   Associated Diagnoses: Nausea         DISCHARGE INSTRUCTIONS:   Follow-up with primary care physician in a week Follow-up with gastroenterology in 1 week   DIET:  Cardiac diet  DISCHARGE CONDITION:  Stable  ACTIVITY:  Activity as  tolerated  OXYGEN:  Home Oxygen: No.   Oxygen Delivery: room air  DISCHARGE LOCATION:  home   If you experience worsening of your admission symptoms, develop shortness of breath, life threatening emergency, suicidal or homicidal thoughts you must seek medical attention immediately by calling 911 or calling your MD immediately  if symptoms less severe.  You Must read complete instructions/literature along with all the possible adverse reactions/side effects for all the Medicines you take and that have been prescribed to you. Take any new Medicines after you have completely understood and accpet all the possible adverse reactions/side effects.   Please note  You were cared for by a hospitalist during your hospital stay. If you have any questions about your discharge medications or the care you received while you were in the hospital after you are discharged, you can call the unit and asked to speak with the hospitalist on call if the hospitalist that took care of you is not available. Once you are discharged, your primary care physician will handle any further medical issues. Please note that NO REFILLS for any discharge medications will be authorized once you are discharged, as it is imperative that you return to your primary care physician (or establish a relationship with a primary care physician if you do not have one) for your aftercare needs so that they can reassess your need for medications and monitor your lab values.     Today  Chief Complaint  Patient presents with  . Abdominal Pain   Patient is feeling fine. Denies any shortness of breath. Cough improved. Wants to go home. No abdominal pain ,had a bowel movement  ROS:  CONSTITUTIONAL: Denies fevers, chills. Denies any fatigue, weakness.  EYES: Denies blurry vision, double vision, eye pain. EARS, NOSE, THROAT: Denies tinnitus, ear pain, hearing loss. RESPIRATORY: Denies cough, wheeze, shortness of breath.  CARDIOVASCULAR:  Denies chest pain, palpitations, edema.  GASTROINTESTINAL: Denies nausea, vomiting, diarrhea, abdominal pain. Denies bright red blood per rectum. GENITOURINARY: Denies dysuria, hematuria. ENDOCRINE: Denies nocturia or thyroid problems. HEMATOLOGIC AND LYMPHATIC: Denies easy bruising or bleeding. SKIN: Denies rash or lesion. MUSCULOSKELETAL: Denies pain in neck, back, shoulder, knees, hips or arthritic symptoms.  NEUROLOGIC: Denies paralysis, paresthesias.  PSYCHIATRIC: Denies anxiety or depressive symptoms.   VITAL SIGNS:  Blood pressure (!) 145/62, pulse 77, temperature 97.6 F (36.4 C), temperature source Oral, resp. rate 20, height 5\' 9"  (1.753 m), weight 87.5 kg (192 lb 14.4 oz), SpO2 92 %.  I/O:    Intake/Output Summary (Last 24 hours) at 01/10/17 1211 Last data filed at 01/10/17 1032  Gross per 24 hour  Intake             1350 ml  Output             1300 ml  Net               50 ml    PHYSICAL EXAMINATION:  GENERAL:  82 y.o.-year-old patient lying in  the bed with no acute distress.  EYES: Pupils equal, round, reactive to light and accommodation. No scleral icterus. Extraocular muscles intact.  HEENT: Head atraumatic, normocephalic. Oropharynx and nasopharynx clear.  NECK:  Supple, no jugular venous distention. No thyroid enlargement, no tenderness.  LUNGS: Normal breath sounds bilaterally, no wheezing, rales,rhonchi or crepitation. No use of accessory muscles of respiration.  CARDIOVASCULAR: S1, S2 normal. No murmurs, rubs, or gallops.  ABDOMEN: Soft, non-tender, non-distended. Bowel sounds present. No organomegaly or mass.  EXTREMITIES: No pedal edema, cyanosis, or clubbing.  NEUROLOGIC: Cranial nerves II through XII are intact. Muscle strength 5/5 in all extremities. Sensation intact. Gait not checked.  PSYCHIATRIC: The patient is alert and oriented x 3.  SKIN: No obvious rash, lesion, or ulcer.   DATA REVIEW:   CBC  Recent Labs Lab 01/09/17 0438  WBC 10.7*  HGB  12.7*  HCT 35.9*  PLT 130*    Chemistries   Recent Labs Lab 01/08/17 0656  01/09/17 1420  NA 138  < > 135  K 4.0  < > 4.0  CL 103  < > 105  CO2 25  < > 23  GLUCOSE 140*  < > 181*  BUN 27*  < > 20  CREATININE 1.72*  < > 1.25*  CALCIUM 8.9  < > 8.0*  MG 2.1  --   --   AST 25  --  66*  ALT 23  --  91*  ALKPHOS 60  --  89  BILITOT 1.8*  < > 0.6  < > = values in this interval not displayed.  Cardiac Enzymes No results for input(s): TROPONINI in the last 168 hours.  Microbiology Results  Results for orders placed or performed during the hospital encounter of 01/08/17  Blood Culture (routine x 2)     Status: None (Preliminary result)   Collection Time: 01/08/17 11:05 AM  Result Value Ref Range Status   Specimen Description BLOOD  Final   Special Requests NONE  Final   Culture NO GROWTH 2 DAYS  Final   Report Status PENDING  Incomplete  Blood Culture (routine x 2)     Status: None (Preliminary result)   Collection Time: 01/08/17 11:10 AM  Result Value Ref Range Status   Specimen Description BLOOD BLOOD LEFT ARM  Final   Special Requests   Final    BOTTLES DRAWN AEROBIC AND ANAEROBIC Blood Culture adequate volume   Culture NO GROWTH 2 DAYS  Final   Report Status PENDING  Incomplete    RADIOLOGY:  Dg Chest 1 View  Result Date: 01/08/2017 CLINICAL DATA:  Pt has had constant, moderate to severe right upper and right sided abdominal pain. Denies nausea or vomiting; C/o SOB Concern for pneumonia HTN, former smoker EXAM: CHEST 1 VIEW COMPARISON:  CT 07/30/2016 FINDINGS: Normal cardiac silhouette. There is fine airspace disease in LEFT RIGHT lower lobe. Small effusions. No pneumothorax. No acute osseous abnormality. IMPRESSION: Fine bibasilar airspace disease suggests mild pulmonary edema/interstitial edema. Small effusions. Electronically Signed   By: Suzy Bouchard M.D.   On: 01/08/2017 11:15   Ct Abdomen Pelvis W Contrast  Result Date: 01/08/2017 CLINICAL DATA:  Abdominal  pain, prostate cancer, prior cholecystectomy EXAM: CT ABDOMEN AND PELVIS WITH CONTRAST TECHNIQUE: Multidetector CT imaging of the abdomen and pelvis was performed using the standard protocol following bolus administration of intravenous contrast. CONTRAST:  23mL ISOVUE-300 IOPAMIDOL (ISOVUE-300) INJECTION 61% COMPARISON:  04/14/2016 FINDINGS: Lower chest: Patchy bilateral lower lobe opacities, suspicious for pneumonia.  Hepatobiliary: Liver is within normal limits. Status post cholecystectomy. No intrahepatic or extrahepatic ductal dilatation. Pancreas: Within normal limits. Spleen: Within normal limits. Adrenals/Urinary Tract: Adrenal glands within normal limits. Kidneys are within normal limits.  No hydronephrosis. Bladder is within normal limits. Stomach/Bowel: Stomach notable for a small hiatal hernia. No evidence of bowel obstruction. Normal appendix (series 2/ image 58). Left colonic diverticulosis, without evidence of diverticulitis. Vascular/Lymphatic: No evidence of abdominal aortic aneurysm. Atherosclerotic calcifications of the abdominal aorta and branch vessels. No suspicious abdominopelvic lymphadenopathy. Reproductive: Brachytherapy seeds in the prostate. Other: No abdominopelvic ascites. Small fat containing right inguinal hernia (series 2/ image 81). Moderate fat containing left inguinal/femoral hernia (series 2/ image 85). Musculoskeletal: Degenerative changes of the visualized thoracolumbar spine. a probable benign bone island in the left parasymphyseal region (series 2/ image 87), unchanged since 2008. Additional tiny sclerotic foci in the and left iliac bone (series 2/image 62) and left L4 vertebral body (series 2/ image 48) are new from 2008 but similar to 2015, possibly reflecting additional bone islands. IMPRESSION: Patchy bilateral lower lobe opacities, suspicious for pneumonia. No evidence of bowel obstruction.  Normal appendix. Left colonic diverticulosis, without evidence of diverticulitis.  Brachytherapy seeds in the prostate. No findings specific for metastatic disease. Additional ancillary findings as above. Electronically Signed   By: Julian Hy M.D.   On: 01/08/2017 10:08    EKG:   Orders placed or performed during the hospital encounter of 04/14/16  . ED EKG  . ED EKG  . EKG  . EKG 12-Lead  . EKG 12-Lead      Management plans discussed with the patient, family and they are in agreement.  CODE STATUS:     Code Status Orders        Start     Ordered   01/08/17 1202  Full code  Continuous     01/08/17 1201    Code Status History    Date Active Date Inactive Code Status Order ID Comments User Context   04/14/2016  9:55 PM 04/17/2016  6:04 PM Full Code [id number]  Ivor Costa, MD ED   04/02/2016  3:29 PM 04/03/2016  4:21 PM Full Code [id number]  Demetrios Loll, MD Inpatient    Advance Directive Documentation     Most Recent Value  Type of Advance Directive  Healthcare Power of [place], [place] will [place] (daughter)]  Pre-existing out of facility DNR order (yellow form or pink MOST form)  -  "MOST" Form in Place?  -      TOTAL TIME TAKING CARE OF THIS PATIENT: 45 minutes.   Note: This dictation was prepared with Dragon dictation along with smaller phrase technology. Any transcriptional errors that result from this process are unintentional.   @[facility] @  on 01/10/2017 at 12:11 PM  Between 7am to 6pm - Pager - [phone]  After 6pm go to [email or web address] - password [email or web address] [facility]  Office  [phone]  CC: Primary care physician; Olin Hauser, DO

## 2017-01-10 NOTE — Care Management Note (Signed)
Case Management Note  Patient Details  Name: Elijah Gauss Sr. MRN: [id number] Date of Birth: 1934/06/13  Subjective/Objective:   Attempted to discuss choice of Home Health providers for HH=PT with Elijah Jackson who reported that he does not want any home health PT.                  Action/Plan:   Expected Discharge Date:  01/10/17               Expected Discharge Plan:     In-House Referral:     Discharge planning Services     Post Acute Care Choice:    Choice offered to:     DME Arranged:    DME Agency:     HH Arranged:    HH Agency:     Status of Service:     If discussed at [employer], dates discussed:    Additional Comments:  Elijah Cocozza A, RN 01/10/2017, 1:07 PM

## 2017-01-10 NOTE — Discharge Instructions (Signed)
Follow-up with primary care physician in a week Follow-up with gastroenterology in 1 week

## 2017-01-11 ENCOUNTER — Telehealth: Payer: Self-pay

## 2017-01-11 NOTE — Telephone Encounter (Signed)
Transition Care Management Follow-up Telephone Call   Date discharged? 01/10/17   How have you been since you were released from the hospital? Better,except a headache today   Do you understand why you were in the hospital? yes   Do you understand the discharge instructions? yes   Where were you discharged to? Home   Items Reviewed:  Medications reviewed: Yes  Allergies reviewed: Yes  Dietary changes reviewed: Yes  Referrals reviewed: Yes   Functional Questionnaire:   Activities of Daily Living (ADLs):   He states they are independent in the following: Eating, dressing, bathing, transferring, ambulation States they require assistance with the following: none   Any transportation issues/concerns?: No   Any patient concerns?No   Confirmed importance and date/time of follow-up visits scheduled Yes  Provider Appointment booked with Dr.Karamalegos for 01/12/2017 at 8:20am.  Confirmed with patient if condition begins to worsen call PCP or go to the ER.  Patient was given the office number and encouraged to call back with question or concerns.  :Yes

## 2017-01-12 ENCOUNTER — Ambulatory Visit (INDEPENDENT_AMBULATORY_CARE_PROVIDER_SITE_OTHER): Payer: Medicare Other | Admitting: Family Medicine

## 2017-01-12 ENCOUNTER — Emergency Department (HOSPITAL_COMMUNITY): Payer: Medicare Other

## 2017-01-12 ENCOUNTER — Encounter (HOSPITAL_COMMUNITY): Payer: Self-pay | Admitting: *Deleted

## 2017-01-12 ENCOUNTER — Encounter: Payer: Self-pay | Admitting: Family Medicine

## 2017-01-12 ENCOUNTER — Inpatient Hospital Stay (HOSPITAL_COMMUNITY)
Admission: EM | Admit: 2017-01-12 | Discharge: 2017-01-14 | DRG: 175 | Disposition: A | Discharge status: Home / Self Care | Payer: Medicare Other | Attending: Internal Medicine | Admitting: Internal Medicine

## 2017-01-12 VITALS — BP 136/62 | HR 102 | Temp 98.8°F | Resp 16 | Ht 69.0 in | Wt 197.0 lb

## 2017-01-12 DIAGNOSIS — Z87891 Personal history of nicotine dependence: Secondary | ICD-10-CM

## 2017-01-12 DIAGNOSIS — G4700 Insomnia, unspecified: Secondary | ICD-10-CM | POA: Diagnosis present

## 2017-01-12 DIAGNOSIS — K219 Gastro-esophageal reflux disease without esophagitis: Secondary | ICD-10-CM | POA: Diagnosis present

## 2017-01-12 DIAGNOSIS — Z7952 Long term (current) use of systemic steroids: Secondary | ICD-10-CM | POA: Diagnosis not present

## 2017-01-12 DIAGNOSIS — I82411 Acute embolism and thrombosis of right femoral vein: Secondary | ICD-10-CM | POA: Diagnosis present

## 2017-01-12 DIAGNOSIS — R071 Chest pain on breathing: Secondary | ICD-10-CM | POA: Diagnosis not present

## 2017-01-12 DIAGNOSIS — K529 Noninfective gastroenteritis and colitis, unspecified: Secondary | ICD-10-CM | POA: Diagnosis present

## 2017-01-12 DIAGNOSIS — I2699 Other pulmonary embolism without acute cor pulmonale: Principal | ICD-10-CM | POA: Diagnosis present

## 2017-01-12 DIAGNOSIS — R1011 Right upper quadrant pain: Secondary | ICD-10-CM | POA: Diagnosis not present

## 2017-01-12 DIAGNOSIS — E785 Hyperlipidemia, unspecified: Secondary | ICD-10-CM | POA: Diagnosis present

## 2017-01-12 DIAGNOSIS — R531 Weakness: Secondary | ICD-10-CM | POA: Diagnosis not present

## 2017-01-12 DIAGNOSIS — I82532 Chronic embolism and thrombosis of left popliteal vein: Secondary | ICD-10-CM | POA: Diagnosis present

## 2017-01-12 DIAGNOSIS — I82443 Acute embolism and thrombosis of tibial vein, bilateral: Secondary | ICD-10-CM | POA: Diagnosis present

## 2017-01-12 DIAGNOSIS — J918 Pleural effusion in other conditions classified elsewhere: Secondary | ICD-10-CM | POA: Diagnosis not present

## 2017-01-12 DIAGNOSIS — J189 Pneumonia, unspecified organism: Secondary | ICD-10-CM

## 2017-01-12 DIAGNOSIS — R197 Diarrhea, unspecified: Secondary | ICD-10-CM | POA: Diagnosis not present

## 2017-01-12 DIAGNOSIS — I824Z1 Acute embolism and thrombosis of unspecified deep veins of right distal lower extremity: Secondary | ICD-10-CM | POA: Diagnosis present

## 2017-01-12 DIAGNOSIS — Z79899 Other long term (current) drug therapy: Secondary | ICD-10-CM | POA: Diagnosis not present

## 2017-01-12 DIAGNOSIS — I10 Essential (primary) hypertension: Secondary | ICD-10-CM | POA: Diagnosis present

## 2017-01-12 DIAGNOSIS — H409 Unspecified glaucoma: Secondary | ICD-10-CM | POA: Diagnosis present

## 2017-01-12 DIAGNOSIS — G7000 Myasthenia gravis without (acute) exacerbation: Secondary | ICD-10-CM | POA: Diagnosis present

## 2017-01-12 DIAGNOSIS — I340 Nonrheumatic mitral (valve) insufficiency: Secondary | ICD-10-CM | POA: Diagnosis not present

## 2017-01-12 DIAGNOSIS — I82431 Acute embolism and thrombosis of right popliteal vein: Secondary | ICD-10-CM | POA: Diagnosis present

## 2017-01-12 DIAGNOSIS — I82512 Chronic embolism and thrombosis of left femoral vein: Secondary | ICD-10-CM | POA: Diagnosis present

## 2017-01-12 DIAGNOSIS — Z8546 Personal history of malignant neoplasm of prostate: Secondary | ICD-10-CM

## 2017-01-12 LAB — COMPREHENSIVE METABOLIC PANEL
ALT: 116 U/L — AB (ref 17–63)
AST: 86 U/L — AB (ref 15–41)
Albumin: 3.2 g/dL — ABNORMAL LOW (ref 3.5–5.0)
Alkaline Phosphatase: 137 U/L — ABNORMAL HIGH (ref 38–126)
Anion gap: 13 (ref 5–15)
BILIRUBIN TOTAL: 1.2 mg/dL (ref 0.3–1.2)
BUN: 20 mg/dL (ref 6–20)
CO2: 18 mmol/L — ABNORMAL LOW (ref 22–32)
CREATININE: 1.18 mg/dL (ref 0.61–1.24)
Calcium: 8.8 mg/dL — ABNORMAL LOW (ref 8.9–10.3)
Chloride: 106 mmol/L (ref 101–111)
GFR calc Af Amer: >60 mL/min (ref >60)
GFR, EST NON AFRICAN AMERICAN: 56 mL/min — AB (ref >60)
Glucose, Bld: 120 mg/dL — ABNORMAL HIGH (ref 65–99)
Potassium: 4.1 mmol/L (ref 3.5–5.1)
Sodium: 137 mmol/L (ref 135–145)
TOTAL PROTEIN: 6.8 g/dL (ref 6.5–8.1)

## 2017-01-12 LAB — CBC
HCT: 40.1 % (ref 39.0–52.0)
Hemoglobin: 13.7 g/dL (ref 13.0–17.0)
MCH: 31.6 pg (ref 26.0–34.0)
MCHC: 34.2 g/dL (ref 30.0–36.0)
MCV: 92.4 fL (ref 78.0–100.0)
PLATELETS: 238 10*3/uL (ref 150–400)
RBC: 4.34 MIL/uL (ref 4.22–5.81)
RDW: 13.3 % (ref 11.5–15.5)
WBC: 8.8 10*3/uL (ref 4.0–10.5)

## 2017-01-12 LAB — URINALYSIS, ROUTINE W REFLEX MICROSCOPIC
BILIRUBIN URINE: NEGATIVE
Bacteria, UA: NONE SEEN
Glucose, UA: NEGATIVE mg/dL
Ketones, ur: 20 mg/dL — AB
LEUKOCYTES UA: NEGATIVE
NITRITE: NEGATIVE
PH: 5 (ref 5.0–8.0)
Protein, ur: NEGATIVE mg/dL
SPECIFIC GRAVITY, URINE: 1.025 (ref 1.005–1.030)
SQUAMOUS EPITHELIAL / LPF: NONE SEEN

## 2017-01-12 LAB — LIPASE, BLOOD: Lipase: 28 U/L (ref 11–51)

## 2017-01-12 LAB — I-STAT CG4 LACTIC ACID, ED: Lactic Acid, Venous: 1.87 mmol/L (ref 0.5–1.9)

## 2017-01-12 LAB — TROPONIN I: Troponin I: 0.06 ng/mL (ref <0.03)

## 2017-01-12 LAB — I-STAT TROPONIN, ED: TROPONIN I, POC: 0.02 ng/mL (ref 0.00–0.08)

## 2017-01-12 MED ORDER — KETOROLAC TROMETHAMINE 15 MG/ML IJ SOLN
15.0000 mg | Freq: Four times a day (QID) | INTRAMUSCULAR | Status: DC | PRN
Start: 2017-01-12 — End: 2017-01-14

## 2017-01-12 MED ORDER — SODIUM CHLORIDE 0.9 % IV BOLUS (SEPSIS)
500.0000 mL | Freq: Once | INTRAVENOUS | Status: AC
  Administered 2017-01-12: 500 mL via INTRAVENOUS

## 2017-01-12 MED ORDER — HEPARIN BOLUS VIA INFUSION
5000.0000 [IU] | Freq: Once | INTRAVENOUS | Status: AC
  Administered 2017-01-12: 5000 [IU] via INTRAVENOUS
  Filled 2017-01-12: qty 5000

## 2017-01-12 MED ORDER — LISINOPRIL 20 MG PO TABS
20.0000 mg | ORAL_TABLET | Freq: Every day | ORAL | Status: DC
  Administered 2017-01-13 – 2017-01-14 (×2): 20 mg via ORAL
  Filled 2017-01-12 (×2): qty 1

## 2017-01-12 MED ORDER — SODIUM CHLORIDE 0.9 % IV SOLN: 250.0000 mL | INTRAVENOUS | Status: DC | PRN

## 2017-01-12 MED ORDER — SODIUM CHLORIDE 0.9% FLUSH: 3.0000 mL | INTRAVENOUS | Status: DC | PRN

## 2017-01-12 MED ORDER — SODIUM CHLORIDE 0.9% FLUSH
3.0000 mL | Freq: Two times a day (BID) | INTRAVENOUS | Status: DC
  Administered 2017-01-12 – 2017-01-13 (×2): 3 mL via INTRAVENOUS

## 2017-01-12 MED ORDER — AMOXICILLIN-POT CLAVULANATE 500-125 MG PO TABS
1.0000 | ORAL_TABLET | Freq: Three times a day (TID) | ORAL | Status: DC
  Administered 2017-01-12 – 2017-01-14 (×5): 500 mg via ORAL
  Filled 2017-01-12 (×5): qty 1

## 2017-01-12 MED ORDER — OXYCODONE HCL 5 MG PO TABS
5.0000 mg | ORAL_TABLET | ORAL | Status: DC | PRN
Start: 2017-01-12 — End: 2017-01-14

## 2017-01-12 MED ORDER — PREDNISONE 10 MG PO TABS
5.0000 mg | ORAL_TABLET | Freq: Every day | ORAL | Status: DC
  Administered 2017-01-13 – 2017-01-14 (×2): 5 mg via ORAL
  Filled 2017-01-12 (×2): qty 1

## 2017-01-12 MED ORDER — ENSURE ENLIVE PO LIQD: 237.0000 mL | Freq: Two times a day (BID) | ORAL | Status: DC

## 2017-01-12 MED ORDER — LATANOPROST 0.005 % OP SOLN
1.0000 [drp] | Freq: Every day | OPHTHALMIC | Status: DC
  Administered 2017-01-12 – 2017-01-13 (×2): 1 [drp] via OPHTHALMIC
  Filled 2017-01-12: qty 2.5

## 2017-01-12 MED ORDER — HEPARIN (PORCINE) IN NACL 100-0.45 UNIT/ML-% IJ SOLN
1100.0000 [IU]/h | INTRAMUSCULAR | Status: DC
  Administered 2017-01-12: 950 [IU]/h via INTRAVENOUS
  Filled 2017-01-12: qty 250

## 2017-01-12 MED ORDER — IOPAMIDOL (ISOVUE-370) INJECTION 76%
INTRAVENOUS | Status: AC
  Administered 2017-01-12: 100 mL
  Filled 2017-01-12: qty 100

## 2017-01-12 MED ORDER — ONDANSETRON 4 MG PO TBDP: 4.0000 mg | ORAL_TABLET | Freq: Three times a day (TID) | ORAL | Status: DC | PRN

## 2017-01-12 MED ORDER — ACETAMINOPHEN 325 MG PO TABS
325.0000 mg | ORAL_TABLET | Freq: Four times a day (QID) | ORAL | Status: DC | PRN
  Administered 2017-01-13: 325 mg via ORAL
  Filled 2017-01-12: qty 1

## 2017-01-12 MED ORDER — ATORVASTATIN CALCIUM 10 MG PO TABS
10.0000 mg | ORAL_TABLET | Freq: Every day | ORAL | Status: DC
  Administered 2017-01-13 – 2017-01-14 (×2): 10 mg via ORAL
  Filled 2017-01-12 (×2): qty 1

## 2017-01-12 NOTE — ED Provider Notes (Signed)
[facility] DEPT Provider Note   CSN: [id number] Arrival date & time: 01/12/17  0946     History   Chief Complaint Chief Complaint  Patient presents with  . Abdominal Pain  . Diarrhea    HPI Elijah Mcwhirter Sr. is a 82 y.o. male.  HPI Patient presents with generalized weakness and right-sided abdominal pain. 5 days ago was admitted at [facility] with right upper quadrant pain and diarrhea. Had had a cough. Diagnosis of bilateral pneumonia after a CT scan of his abdomen pelvis. See only real abnormality. No fevers. Has had a cough without sputum production. States the pain hurts in the abdomen when he breathes. Has had some watery diarrhea. He has been discharged on Sunday and was doing a little better but wife thinks was too early for him to go home. Followed up with primary care doctor today. Sent in for weakness. Previous pulmonary embolism thought to be due to immobility. Had been on apixiban, but reportedly stopped by [place]. Past Medical History:  Diagnosis Date  . Dyslipidemia   . GERD (gastroesophageal reflux disease)   . Glaucoma   . Hyperlipidemia   . Hypertension   . Insomnia   . Myasthenia gravis ([place]) 09/08/2012  . Obesity   . Ocular myasthenia gravis ([place])   . Prostate cancer [facility])     Patient Active Problem List   Diagnosis Date Noted  . Pneumonia 01/08/2017  . Excessive daytime sleepiness 12/24/2016  . Diarrhea 12/24/2016  . Squamous cell carcinoma 09/07/2016  . Cavitating mass of lung 04/17/2016  . Gastritis 04/17/2016  . Pulmonary emboli ([place]) 04/15/2016  . Essential hypertension 04/14/2016  . HLD (hyperlipidemia) 04/14/2016  . GERD (gastroesophageal reflux disease) 04/14/2016  . Hypokalemia 04/14/2016  . Weakness 04/14/2016  . Postcholecystectomy diarrhea 09/14/2013  . Dizziness and giddiness 08/02/2013  . Chronic cholecystitis 01/25/2013  . Myasthenia gravis ([place]) 09/08/2012  . Abdominal pain, RUQ 09/08/2012  . Pancreatitis, acute 09/08/2012      Past Surgical History:  Procedure Laterality Date  . CATARACT EXTRACTION Bilateral   . CHOLECYSTECTOMY    . TRANSURETHRAL RESECTION OF PROSTATE         Home Medications    Prior to Admission medications   Medication Sig Start Date End Date Taking? Authorizing Provider  acetaminophen (TYLENOL) 325 MG tablet Take 1 tablet (325 mg total) by mouth every 6 (six) hours as needed for mild pain (or Fever >/= 101). 01/10/17  Yes Gouru, Illene Silver, MD  amoxicillin-clavulanate (AUGMENTIN) 500-125 MG tablet Take 1 tablet (500 mg total) by mouth 3 (three) times daily. 01/10/17 01/15/17 Yes Gouru, Illene Silver, MD  atorvastatin (LIPITOR) 10 MG tablet Take 10 mg by mouth daily.   Yes [provider]  feeding supplement, ENSURE ENLIVE, (ENSURE ENLIVE) LIQD Take 237 mLs by mouth 2 (two) times daily between meals. 01/10/17  Yes Gouru, Aruna, MD  latanoprost (XALATAN) 0.005 % ophthalmic solution Place 1 drop into both eyes at bedtime.   Yes [provider]  lisinopril (PRINIVIL,ZESTRIL) 40 MG tablet Take 0.5 tablets (20 mg total) by mouth daily. 01/10/17  Yes Gouru, Illene Silver, MD  omeprazole (PRILOSEC) 20 MG capsule Take 2 capsules (40 mg total) by mouth 2 (two) times daily before a meal. 04/17/16  Yes Rizwan, Eunice Blase, MD  ondansetron (ZOFRAN ODT) 4 MG disintegrating tablet Take 1 tablet (4 mg total) by mouth every 8 (eight) hours as needed for nausea or vomiting. 12/24/16  Yes Karamalegos, Devonne Doughty, DO  predniSONE (DELTASONE) 5 MG tablet Take  5 mg by mouth daily. 05/15/14  Yes [provider]  senna-docusate (SENOKOT-S) 8.6-50 MG tablet Take 1 tablet by mouth at bedtime as needed for mild constipation. Patient not taking: Reported on 01/12/2017 01/10/17   Nicholes Mango, MD    Family History Family History  Problem Relation Age of Onset  . Heart attack Father     Social History Social History  Substance Use Topics  . Smoking status: Former Smoker    Quit date: 04/06/1970  . Smokeless tobacco:  Former Systems developer  . Alcohol use 0.0 oz/week     Comment: Consumes alcohol on occasion     Allergies   Patient has no known allergies.   Review of Systems Review of Systems  Constitutional: Positive for appetite change. Negative for fever.  Respiratory: Positive for cough and shortness of breath.   Cardiovascular: Negative for chest pain.  Gastrointestinal: Positive for abdominal pain and diarrhea.  Endocrine: Negative for polyuria.  Genitourinary: Negative for flank pain.  Musculoskeletal: Negative for arthralgias.  Skin: Negative for rash.  Neurological: Negative for syncope.  Psychiatric/Behavioral: Negative for confusion.     Physical Exam Updated Vital Signs BP (!) 141/87   Pulse 76   Temp 98.2 F (36.8 C)   Resp (!) 24   SpO2 96%   Physical Exam  Constitutional: He appears well-developed and well-nourished.  HENT:  Head: Normocephalic.  Eyes: EOM are normal.  Neck: Neck supple.  Cardiovascular: Normal rate.   Pulmonary/Chest:  Rales bilateral bases.  Abdominal: Soft. There is no tenderness.  Musculoskeletal: He exhibits no edema.  Neurological: He is alert.  Skin: Skin is warm. Capillary refill takes less than 2 seconds.  Psychiatric: He has a normal mood and affect.     ED Treatments / Results  Labs (all labs ordered are listed, but only abnormal results are displayed) Labs Reviewed  COMPREHENSIVE METABOLIC PANEL - Abnormal; Notable for the following:       Result Value   CO2 18 (*)    Glucose, Bld 120 (*)    Calcium 8.8 (*)    Albumin 3.2 (*)    AST 86 (*)    ALT 116 (*)    Alkaline Phosphatase 137 (*)    GFR calc non Af Amer 56 (*)    All other components within normal limits  URINALYSIS, ROUTINE W REFLEX MICROSCOPIC - Abnormal; Notable for the following:    APPearance HAZY (*)    Hgb urine dipstick SMALL (*)    Ketones, ur 20 (*)    All other components within normal limits  LIPASE, BLOOD  CBC  I-STAT CG4 LACTIC ACID, ED  I-STAT TROPONIN,  ED    EKG  EKG Interpretation  Date/Time:  Tuesday January 12 2017 09:48:22 EDT Ventricular Rate:  101 PR Interval:    QRS Duration: 98 QT Interval:  348 QTC Calculation: 451 R Axis:   20 Text Interpretation:  Atrial fibrillation with rapid ventricular response Abnormal ECG Confirmed by Davonna Belling [phone]) on 01/12/2017 3:57:44 PM       Radiology Dg Chest 2 View  Result Date: 01/12/2017 CLINICAL DATA:  Cough. Right-sided pain. Not improving with antibiotics. EXAM: CHEST  2 VIEW COMPARISON:  Four days ago FINDINGS: There is a progressively more well-defined subpleural opacity in the right lower lobe. Question hazy lingular opacity. Normal heart size. Negative mediastinal contours. No visible effusion or pneumothorax. These results were called by telephone at the time of interpretation on 01/12/2017 at 5:22 pm to Dr. Ovid Curd  Shirell Struthers , who verbally acknowledged these results. IMPRESSION: 1. Distinct subpleural opacity in the right lower lobe. Patient has history of pneumonia and this could reflect progression. Consider alternate diagnosis such as lung infarct, which can also give this radiographic appearance. 2. Mild lingular opacification presumably related to the same. Electronically Signed   By: Monte Fantasia M.D.   On: 01/12/2017 17:22   Ct Angio Chest Pe W And/or Wo Contrast  Result Date: 01/12/2017 CLINICAL DATA:  Shortness of breath. EXAM: CT ANGIOGRAPHY CHEST WITH CONTRAST TECHNIQUE: Multidetector CT imaging of the chest was performed using the standard protocol during bolus administration of intravenous contrast. Multiplanar CT image reconstructions and MIPs were obtained to evaluate the vascular anatomy. CONTRAST:  100 mL of Isovue 370 intravenously. COMPARISON:  CT scan of July 30, 2016. FINDINGS: Cardiovascular: Atherosclerosis of thoracic aorta is noted without aneurysm formation. Coronary artery calcifications are noted. Large linear filling defect is seen at the  bifurcation the right pulmonary artery, extending into lower lobe and upper lobe branches. RV/LV ratio of greater than 1.5 is noted which may be due to thickness of left ventricular wall, although right heart strain cannot be excluded. Mediastinum/Nodes: No enlarged mediastinal, hilar, or axillary lymph nodes. Thyroid gland, trachea, and esophagus demonstrate no significant findings. Lungs/Pleura: No pneumothorax is noted. Left lung is clear. Mild right posterior basilar opacity is noted which may represent pulmonary infarction. Upper Abdomen: No acute abnormality. Musculoskeletal: No chest wall abnormality. No acute or significant osseous findings. Review of the MIP images confirms the above findings. IMPRESSION: Large right-sided pulmonary embolus is noted with possible associated pulmonary infarction of right lower lobe posteriorly. RV/LV ratio suggesting possible right heart strain. Critical Value/emergent results were called by telephone at the time of interpretation on 01/12/2017 at 7:20 pm to Dr. Davonna Belling , who verbally acknowledged these results. Coronary artery calcifications are noted. Aortic Atherosclerosis (ICD10-I70.0). Electronically Signed   By: Marijo Conception, M.D.   On: 01/12/2017 19:20   US Abdomen Limited  Result Date: 01/12/2017 CLINICAL DATA:  82 year old male with history of right upper quadrant pain for the past 4 days. EXAM: ULTRASOUND ABDOMEN LIMITED RIGHT UPPER QUADRANT COMPARISON:  Abdominal ultrasound 01/07/2014. CT the abdomen pelvis 01/08/2017. FINDINGS: Gallbladder: Status post cholecystectomy. Common bile duct: Diameter: 6.4 mm Liver: No focal lesion identified. Within normal limits in parenchymal echogenicity. Portal vein is patent on color Doppler imaging with normal direction of blood flow towards the liver. IMPRESSION: 1. No acute findings noted to account for the patient's symptoms. 2. Status post cholecystectomy. Electronically Signed   By: Vinnie Langton M.D.    On: 01/12/2017 17:51    Procedures Procedures (including critical care time)  Medications Ordered in ED Medications  sodium chloride 0.9 % bolus 500 mL (0 mLs Intravenous Stopped 01/12/17 1728)  iopamidol (ISOVUE-370) 76 % injection (100 mLs  Contrast Given 01/12/17 1845)     Initial Impression / Assessment and Plan / ED Course  I have reviewed the triage vital signs and the nursing notes.  Pertinent labs & imaging results that were available during my care of the patient were reviewed by me and considered in my medical decision making (see chart for details).   patient with shortness of breath and upper abdominal pain. Recently admitted from pneumonia but it may have been a pulmonary embolism all along. Had been seen by pulmonary 2 months ago and recommended to continue his anticoagulation however his primary doctor at the [place] but it should be stopped  and the medicine was stopped. Now has recurrent pulmonary most. RV is larger than the left ventricle. Also has had some diarrhea and decreased oral intake. I think patient benefit from admission for this.  Final Clinical Impressions(s) / ED Diagnoses   Final diagnoses:  Other acute pulmonary embolism without acute cor pulmonale (HCC)    New Prescriptions New Prescriptions   No medications on file     Davonna Belling, MD 01/12/17 1941

## 2017-01-12 NOTE — ED Triage Notes (Signed)
Pt reports recently being admitted at [facility] on Friday, dc Sunday. Was admitted for pneumonia. Reports not feeling any better, still has nonproductive cough and fatigue. Denies fever, no resp distress is noted at triage. Pt also having watery diarrhea since discharge and has right side abd pain that radiates around his side.

## 2017-01-12 NOTE — ED Notes (Signed)
Pt given water 

## 2017-01-12 NOTE — ED Notes (Signed)
Attempted report x1. 

## 2017-01-12 NOTE — ED Notes (Signed)
ED Provider at bedside. 

## 2017-01-12 NOTE — Progress Notes (Signed)
CRITICAL VALUE ALERT  Critical Value:  Troponin 0.06  Date & Time Notied:  01/12/17  2357  Provider Notified: Raliegh Ip Schorr  Orders Received/Actions taken: No new orders at this time

## 2017-01-12 NOTE — Patient Instructions (Addendum)
Thank you for coming to the clinic today.  1. Please go back to the Hospital Emergency Department as discussed in [facility]  Here are my concerns - Right upper quadrant abdominal / lower R lung pain - worse with breathing, question if this is related to pleural effusion or fluid affecting lung. May need other imaging such as CT lung vs Ultrasound. Consider fluid medication diuretic or potentially thoracentesis if significant increased amount of fluid  - Concern with chronic watery diarrhea, also now on antibiotics, may need C Diff Testing and stool culture.  - Concern with chronic weakness and poor intake, at risk for dehydration again and may need rehab after hospitalization, such as [place] or Inpatient Rehab  Please schedule a Follow-up Appointment to: Return in about 2 weeks (around 01/26/2017) for hospital follow-up.  If you have any other questions or concerns, please feel free to call the clinic or send a message through [place]. You may also schedule an earlier appointment if necessary.  Additionally, you may be receiving a survey about your experience at our clinic within a few days to 1 week by e-mail or mail. We value your feedback.  Nobie Putnam, DO [place]

## 2017-01-12 NOTE — H&P (Signed)
History and Physical    Elijah Sheppard Sr. UVO:[id number] DOB: 08/17/1934 DOA: 01/12/2017  PCP: Olin Hauser, DO  Patient coming from:  home  Chief Complaint:   Chest pain, sob, weak  HPI: Elijah Vital Sr. is a 82 y.o. male with medical history significant of prev PE and DVT last year was on eloquis for 6 months stopped about 2 months ago by [place], GERD, HTN comes in after being treated for pna recently dc from [place] on abx and is just not feeling better.  He denies any fevers.  No cough but having a lot of pain with breathing on the right lower chest area.  No leg swelling or edema.  Pt had cta which shows a significant PE with possible heart strain and referred for admission for such.   Review of Systems: As per HPI otherwise 10 point review of systems negative.   Past Medical History:  Diagnosis Date  . Dyslipidemia   . GERD (gastroesophageal reflux disease)   . Glaucoma   . Hyperlipidemia   . Hypertension   . Insomnia   . Myasthenia gravis ([place]) 09/08/2012  . Obesity   . Ocular myasthenia gravis ([place])   . Prostate cancer [facility])     Past Surgical History:  Procedure Laterality Date  . CATARACT EXTRACTION Bilateral   . CHOLECYSTECTOMY    . TRANSURETHRAL RESECTION OF PROSTATE       reports that he quit smoking about 46 years ago. He has quit using smokeless tobacco. He reports that he drinks alcohol. He reports that he does not use drugs.  No Known Allergies  Family History  Problem Relation Age of Onset  . Heart attack Father     Prior to Admission medications   Medication Sig Start Date End Date Taking? Authorizing Provider  acetaminophen (TYLENOL) 325 MG tablet Take 1 tablet (325 mg total) by mouth every 6 (six) hours as needed for mild pain (or Fever >/= 101). 01/10/17  Yes Gouru, Illene Silver, MD  amoxicillin-clavulanate (AUGMENTIN) 500-125 MG tablet Take 1 tablet (500 mg total) by mouth 3 (three) times daily. 01/10/17 01/15/17 Yes Gouru, Illene Silver, MD  atorvastatin  (LIPITOR) 10 MG tablet Take 10 mg by mouth daily.   Yes [provider]  feeding supplement, ENSURE ENLIVE, (ENSURE ENLIVE) LIQD Take 237 mLs by mouth 2 (two) times daily between meals. 01/10/17  Yes Gouru, Aruna, MD  latanoprost (XALATAN) 0.005 % ophthalmic solution Place 1 drop into both eyes at bedtime.   Yes [provider]  lisinopril (PRINIVIL,ZESTRIL) 40 MG tablet Take 0.5 tablets (20 mg total) by mouth daily. 01/10/17  Yes Gouru, Illene Silver, MD  omeprazole (PRILOSEC) 20 MG capsule Take 2 capsules (40 mg total) by mouth 2 (two) times daily before a meal. 04/17/16  Yes Rizwan, Eunice Blase, MD  ondansetron (ZOFRAN ODT) 4 MG disintegrating tablet Take 1 tablet (4 mg total) by mouth every 8 (eight) hours as needed for nausea or vomiting. 12/24/16  Yes Karamalegos, Devonne Doughty, DO  predniSONE (DELTASONE) 5 MG tablet Take 5 mg by mouth daily. 05/15/14  Yes [provider]  senna-docusate (SENOKOT-S) 8.6-50 MG tablet Take 1 tablet by mouth at bedtime as needed for mild constipation. Patient not taking: Reported on 01/12/2017 01/10/17   Nicholes Mango, MD    Physical Exam: Vitals:   01/12/17 1845 01/12/17 1900 01/12/17 1930 01/12/17 2000  BP: (!) 153/80  (!) 141/87 (!) 145/79  Pulse:   76 74  Resp:  19 (!) 24 (!) 25  Temp:      SpO2:   96% 95%    Constitutional: NAD, calm, comfortable Vitals:   01/12/17 1845 01/12/17 1900 01/12/17 1930 01/12/17 2000  BP: (!) 153/80  (!) 141/87 (!) 145/79  Pulse:   76 74  Resp:  19 (!) 24 (!) 25  Temp:      SpO2:   96% 95%   Eyes: PERRL, lids and conjunctivae normal ENMT: Mucous membranes are moist. Posterior pharynx clear of any exudate or lesions.Normal dentition.  Neck: normal, supple, no masses, no thyromegaly Respiratory: clear to auscultation bilaterally, no wheezing, no crackles. Normal respiratory effort. No accessory muscle use.  Cardiovascular: Regular rate and rhythm, no murmurs / rubs / gallops. No extremity edema. 2+ pedal pulses.  No carotid bruits.  Abdomen: no tenderness, no masses palpated. No hepatosplenomegaly. Bowel sounds positive.  Musculoskeletal: no clubbing / cyanosis. No joint deformity upper and lower extremities. Good ROM, no contractures. Normal muscle tone.  Skin: no rashes, lesions, ulcers. No induration Neurologic: CN 2-12 grossly intact. Sensation intact, DTR normal. Strength 5/5 in all 4.  Psychiatric: Normal judgment and insight. Alert and oriented x 3. Normal mood.    Labs on Admission: I have personally reviewed following labs and imaging studies  CBC:  Recent Labs Lab 01/08/17 0656 01/09/17 0438 01/12/17 1003  WBC 12.9* 10.7* 8.8  HGB 14.1 12.7* 13.7  HCT 41.1 35.9* 40.1  MCV 95.8 94.1 92.4  PLT 167 130* 932   Basic Metabolic Panel:  Recent Labs Lab 01/08/17 0656 01/09/17 0438 01/09/17 1420 01/12/17 1003  NA 138 137 135 137  K 4.0 3.7 4.0 4.1  CL 103 105 105 106  CO2 25 24 23  18*  GLUCOSE 140* 119* 181* 120*  BUN 27* 21* 20 20  CREATININE 1.72* 1.18 1.25* 1.18  CALCIUM 8.9 8.0* 8.0* 8.8*  MG 2.1  --   --   --    GFR: Estimated Creatinine Clearance: 55.2 mL/min (by C-G formula based on SCr of 1.18 mg/dL). Liver Function Tests:  Recent Labs Lab 01/08/17 0656 01/09/17 0438 01/09/17 1420 01/12/17 1003  AST 25  --  66* 86*  ALT 23  --  91* 116*  ALKPHOS 60  --  89 137*  BILITOT 1.8* 1.3* 0.6 1.2  PROT 7.3  --  5.9* 6.8  ALBUMIN 3.9  --  3.0* 3.2*    Recent Labs Lab 01/08/17 0656 01/12/17 1003  LIPASE 21 28   Coagulation Profile:  Recent Labs Lab 01/08/17 0720  INR 1.03   Urine analysis:    Component Value Date/Time   COLORURINE YELLOW 01/12/2017 1526   APPEARANCEUR HAZY (A) 01/12/2017 1526   APPEARANCEUR Clear 01/07/2014 1435   LABSPEC 1.025 01/12/2017 1526   LABSPEC 1.020 01/07/2014 1435   PHURINE 5.0 01/12/2017 1526   GLUCOSEU NEGATIVE 01/12/2017 1526   GLUCOSEU Negative 01/07/2014 1435   HGBUR SMALL (A) 01/12/2017 1526   BILIRUBINUR  NEGATIVE 01/12/2017 1526   BILIRUBINUR Negative 01/07/2014 1435   KETONESUR 20 (A) 01/12/2017 1526   PROTEINUR NEGATIVE 01/12/2017 1526   NITRITE NEGATIVE 01/12/2017 1526   LEUKOCYTESUR NEGATIVE 01/12/2017 1526   LEUKOCYTESUR Negative 01/07/2014 1435    Recent Results (from the past 240 hour(s))  Blood Culture (routine x 2)     Status: None (Preliminary result)   Collection Time: 01/08/17 11:05 AM  Result Value Ref Range Status   Specimen Description BLOOD  Final   Special Requests NONE  Final   Culture NO GROWTH 4  DAYS  Final   Report Status PENDING  Incomplete  Blood Culture (routine x 2)     Status: None (Preliminary result)   Collection Time: 01/08/17 11:10 AM  Result Value Ref Range Status   Specimen Description BLOOD BLOOD LEFT ARM  Final   Special Requests   Final    BOTTLES DRAWN AEROBIC AND ANAEROBIC Blood Culture adequate volume   Culture NO GROWTH 4 DAYS  Final   Report Status PENDING  Incomplete     Radiological Exams on Admission: Dg Chest 2 View  Result Date: 01/12/2017 CLINICAL DATA:  Cough. Right-sided pain. Not improving with antibiotics. EXAM: CHEST  2 VIEW COMPARISON:  Four days ago FINDINGS: There is a progressively more well-defined subpleural opacity in the right lower lobe. Question hazy lingular opacity. Normal heart size. Negative mediastinal contours. No visible effusion or pneumothorax. These results were called by telephone at the time of interpretation on 01/12/2017 at 5:22 pm to Dr. Davonna Belling , who verbally acknowledged these results. IMPRESSION: 1. Distinct subpleural opacity in the right lower lobe. Patient has history of pneumonia and this could reflect progression. Consider alternate diagnosis such as lung infarct, which can also give this radiographic appearance. 2. Mild lingular opacification presumably related to the same. Electronically Signed   By: Monte Fantasia M.D.   On: 01/12/2017 17:22   Ct Angio Chest Pe W And/or Wo  Contrast  Result Date: 01/12/2017 CLINICAL DATA:  Shortness of breath. EXAM: CT ANGIOGRAPHY CHEST WITH CONTRAST TECHNIQUE: Multidetector CT imaging of the chest was performed using the standard protocol during bolus administration of intravenous contrast. Multiplanar CT image reconstructions and MIPs were obtained to evaluate the vascular anatomy. CONTRAST:  100 mL of Isovue 370 intravenously. COMPARISON:  CT scan of July 30, 2016. FINDINGS: Cardiovascular: Atherosclerosis of thoracic aorta is noted without aneurysm formation. Coronary artery calcifications are noted. Large linear filling defect is seen at the bifurcation the right pulmonary artery, extending into lower lobe and upper lobe branches. RV/LV ratio of greater than 1.5 is noted which may be due to thickness of left ventricular wall, although right heart strain cannot be excluded. Mediastinum/Nodes: No enlarged mediastinal, hilar, or axillary lymph nodes. Thyroid gland, trachea, and esophagus demonstrate no significant findings. Lungs/Pleura: No pneumothorax is noted. Left lung is clear. Mild right posterior basilar opacity is noted which may represent pulmonary infarction. Upper Abdomen: No acute abnormality. Musculoskeletal: No chest wall abnormality. No acute or significant osseous findings. Review of the MIP images confirms the above findings. IMPRESSION: Large right-sided pulmonary embolus is noted with possible associated pulmonary infarction of right lower lobe posteriorly. RV/LV ratio suggesting possible right heart strain. Critical Value/emergent results were called by telephone at the time of interpretation on 01/12/2017 at 7:20 pm to Dr. Davonna Belling , who verbally acknowledged these results. Coronary artery calcifications are noted. Aortic Atherosclerosis (ICD10-I70.0). Electronically Signed   By: Marijo Conception, M.D.   On: 01/12/2017 19:20   US Abdomen Limited  Result Date: 01/12/2017 CLINICAL DATA:  83 year old male with history  of right upper quadrant pain for the past 4 days. EXAM: ULTRASOUND ABDOMEN LIMITED RIGHT UPPER QUADRANT COMPARISON:  Abdominal ultrasound 01/07/2014. CT the abdomen pelvis 01/08/2017. FINDINGS: Gallbladder: Status post cholecystectomy. Common bile duct: Diameter: 6.4 mm Liver: No focal lesion identified. Within normal limits in parenchymal echogenicity. Portal vein is patent on color Doppler imaging with normal direction of blood flow towards the liver. IMPRESSION: 1. No acute findings noted to account for the patient's symptoms.  2. Status post cholecystectomy. Electronically Signed   By: Vinnie Langton M.D.   On: 01/12/2017 17:51    EKG: Independently reviewed.  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 Trop neg.  Check echo in am.  Serial trop.  Place on hep drip in stepdown overnight.  Will need to be on life time anticoagulation  Active Problems:   Chest pain on breathing- due to PE   Myasthenia gravis ([place])- noted, stable at this time   Essential hypertension- stable, cont ace inh   Weakness- noted     DVT prophylaxis:  hep Code Status:  full Family Communication:  none Disposition Plan:  Per day team Consults called:  none Admission status:  admission   Colston Pyle A MD [facility]  If 7PM-7AM, please contact night-coverage [email or web address] Password [email or web address]  01/12/2017, 8:29 PM

## 2017-01-12 NOTE — ED Notes (Signed)
Attempted report x 2 

## 2017-01-12 NOTE — Progress Notes (Signed)
Subjective:    Patient ID: Elijah Ponder Sr., male    DOB: 06-30-1934, 82 y.o.   MRN: [id number]  Elijah Koziol Sr. is a 82 y.o. male presenting on 01/12/2017 for Hospitalization Follow-up (pneumonia)   HPI  HOSPITAL FOLLOW-UP VISIT  Hospital/Location: [facility] Date of Admission: 01/08/17 Date of Discharge: 01/10/17 Transitions of care telephone call: Completed by Tyler Aas LPN on 79/0/24  Reason for Admission: RUQ Abdominal Pain, Chronic Diarrhea, Chronic Weakness, Dehydration Primary (+Secondary) Diagnosis: Bilateral Pneumonia, lower lobes. AKI secondary to dehydration, Chronic Diarrhea  - Hospital H&P and Discharge Summary have been reviewed - Patient presents today 2 days after recent hospitalization. Brief summary of recent course, patient had symptoms of chronic watery diarrhea intermittently for months, worsening over past 2-3 weeks, poor PO intake, dehydration as well as generalized weakness, initially outpatient thought to have viral gastro and given symptomatic treatment advice, ultimately he did not improve and went to [facility] ED, hospitalized with identified bilateral lower lobe pneumonia and dehydration AKI, treated with IV fluids and antibiotics (Rocephin in ED and Azithromycin) transitioned to Augmentin PO, AKI resolved Cr back to baseline Cr, resolved hyperbilirubin. Additionally he was recommended to have [place] PT on discharge but patient states this was never arranged or scheduled. - Today reports overall has not improved after discharge. Symptoms of seem to be worsening again with difficulty now taking deep breaths more on R side, lower lung upper abdomen has significant pain with deep breath, some pain with position change as well cannot lay on his R side. Still having watery diarrhea and abdominal discomfort. Poor PO intake and poor hydration, concern he may be getting dehydrated again by report. - Admits some mild cough worsening, non productive - Admits generalized weakness -  Denies any fever, chills, sweats, persistent vomiting, hematemesis, dark stool or blood in stool, chest pain or tightness, exertional symptoms  - New medications on discharge: Augmentin 500-125mg  TID for 5 days on discharge - Changes to current meds on discharge: None  I have reviewed the discharge medication list, and have reconciled the current and discharge medications today.   Current Outpatient Prescriptions:  .  acetaminophen (TYLENOL) 325 MG tablet, Take 1 tablet (325 mg total) by mouth every 6 (six) hours as needed for mild pain (or Fever >/= 101)., Disp: , Rfl:  .  amoxicillin-clavulanate (AUGMENTIN) 500-125 MG tablet, Take 1 tablet (500 mg total) by mouth 3 (three) times daily., Disp: 15 tablet, Rfl: 0 .  atorvastatin (LIPITOR) 10 MG tablet, Take 10 mg by mouth daily., Disp: , Rfl:  .  feeding supplement, ENSURE ENLIVE, (ENSURE ENLIVE) LIQD, Take 237 mLs by mouth 2 (two) times daily between meals., Disp: 60 Bottle, Rfl: 0 .  latanoprost (XALATAN) 0.005 % ophthalmic solution, Place 1 drop into both eyes at bedtime., Disp: , Rfl:  .  lisinopril (PRINIVIL,ZESTRIL) 40 MG tablet, Take 0.5 tablets (20 mg total) by mouth daily., Disp: 30 tablet, Rfl: 0 .  omeprazole (PRILOSEC) 20 MG capsule, Take 2 capsules (40 mg total) by mouth 2 (two) times daily before a meal., Disp: 60 capsule, Rfl: 0 .  ondansetron (ZOFRAN ODT) 4 MG disintegrating tablet, Take 1 tablet (4 mg total) by mouth every 8 (eight) hours as needed for nausea or vomiting., Disp: 30 tablet, Rfl: 1 .  predniSONE (DELTASONE) 5 MG tablet, Take 5 mg by mouth daily., Disp: , Rfl:  .  senna-docusate (SENOKOT-S) 8.6-50 MG tablet, Take 1 tablet by mouth at bedtime as needed for mild constipation. (  Patient not taking: Reported on 01/12/2017), Disp: , Rfl:   ------------------------------------------------------------------------- Social History  Substance Use Topics  . Smoking status: Former Smoker    Quit date: 04/06/1970  . Smokeless  tobacco: Former Systems developer  . Alcohol use 0.0 oz/week     Comment: Consumes alcohol on occasion    Review of Systems Per HPI unless specifically indicated above     Objective:    BP 136/62   Pulse (!) 102   Temp 98.8 F (37.1 C) (Oral)   Resp 16   Ht 5\' 9"  (1.753 m)   Wt 197 lb (89.4 kg)   SpO2 97%   BMI 29.09 kg/m   Wt Readings from Last 3 Encounters:  01/12/17 197 lb (89.4 kg)  01/08/17 192 lb 14.4 oz (87.5 kg)  12/24/16 197 lb 3.2 oz (89.4 kg)    Physical Exam  Constitutional: He is oriented to person, place, and time. He appears well-developed and well-nourished. No distress.  Tired and ill appearing, slightly uncomfortable, cooperative  HENT:  Head: Normocephalic and atraumatic.  Mouth/Throat: Oropharynx is clear and moist.  Oropharynx clear without erythema, exudates, edema or asymmetry.  Mild dry mucus mem  Eyes: Conjunctivae are normal. Right eye exhibits no discharge. Left eye exhibits no discharge.  Neck: Normal range of motion.  Cardiovascular: Normal rate, regular rhythm, normal heart sounds and intact distal pulses.   No murmur heard. Pulmonary/Chest: No respiratory distress. He has no wheezes. He has rales (bibasilar R>L).  Some difficulty with deep breathing due to pain on inspiration RLL. Reduced breathing effort  Abdominal: Soft. Bowel sounds are normal. He exhibits distension (upper abdomen). He exhibits no mass. There is tenderness (RUQ some discomfort on palpation, mostly provoked by inspiration deeper breathing). There is no rebound and no guarding.  Musculoskeletal: Normal range of motion. He exhibits no edema.  Neurological: He is alert and oriented to person, place, and time.  Skin: Skin is warm and dry. No rash noted. He is not diaphoretic. No erythema.  Psychiatric: He has a normal mood and affect. His behavior is normal.  Well groomed, good eye contact, normal speech and thoughts  Nursing note and vitals reviewed.  I have personally reviewed the  radiology report from Abdominal CT on 01/08/17.  CLINICAL DATA:  Abdominal pain, prostate cancer, prior cholecystectomy  EXAM: CT ABDOMEN AND PELVIS WITH CONTRAST  TECHNIQUE: Multidetector CT imaging of the abdomen and pelvis was performed using the standard protocol following bolus administration of intravenous contrast.  CONTRAST:  61mL ISOVUE-300 IOPAMIDOL (ISOVUE-300) INJECTION 61%  COMPARISON:  04/14/2016  FINDINGS: Lower chest: Patchy bilateral lower lobe opacities, suspicious for pneumonia.  Hepatobiliary: Liver is within normal limits.  Status post cholecystectomy. No intrahepatic or extrahepatic ductal dilatation.  Pancreas: Within normal limits.  Spleen: Within normal limits.  Adrenals/Urinary Tract: Adrenal glands within normal limits.  Kidneys are within normal limits.  No hydronephrosis.  Bladder is within normal limits.  Stomach/Bowel: Stomach notable for a small hiatal hernia.  No evidence of bowel obstruction.  Normal appendix (series 2/ image 58).  Left colonic diverticulosis, without evidence of diverticulitis.  Vascular/Lymphatic: No evidence of abdominal aortic aneurysm.  Atherosclerotic calcifications of the abdominal aorta and branch vessels.  No suspicious abdominopelvic lymphadenopathy.  Reproductive: Brachytherapy seeds in the prostate.  Other: No abdominopelvic ascites.  Small fat containing right inguinal hernia (series 2/ image 81). Moderate fat containing left inguinal/femoral hernia (series 2/ image 85).  Musculoskeletal: Degenerative changes of the visualized thoracolumbar spine.  a probable  benign bone island in the left parasymphyseal region (series 2/ image 87), unchanged since 2008. Additional tiny sclerotic foci in the and left iliac bone (series 2/image 62) and left L4 vertebral body (series 2/ image 48) are new from 2008 but similar to 2015, possibly reflecting additional bone  islands.  IMPRESSION: Patchy bilateral lower lobe opacities, suspicious for pneumonia.  No evidence of bowel obstruction.  Normal appendix.  Left colonic diverticulosis, without evidence of diverticulitis.  Brachytherapy seeds in the prostate. No findings specific for metastatic disease.  Additional ancillary findings as above.   Electronically Signed   By: Julian Hy M.D.   On: 01/08/2017 10:08 -------------------------------------------  CLINICAL DATA:  Pt has had constant, moderate to severe right upper and right sided abdominal pain. Denies nausea or vomiting; C/o SOB Concern for pneumonia HTN, former smoker  EXAM: CHEST 1 VIEW  COMPARISON:  CT 07/30/2016  FINDINGS: Normal cardiac silhouette. There is fine airspace disease in LEFT RIGHT lower lobe. Small effusions. No pneumothorax. No acute osseous abnormality.  IMPRESSION: Fine bibasilar airspace disease suggests mild pulmonary edema/interstitial edema. Small effusions.   Electronically Signed   By: Suzy Bouchard M.D.   On: 01/08/2017 11:15  Results for orders placed or performed during the hospital encounter of 01/08/17  Blood Culture (routine x 2)  Result Value Ref Range   Specimen Description BLOOD    Special Requests NONE    Culture NO GROWTH 3 DAYS    Report Status PENDING   Blood Culture (routine x 2)  Result Value Ref Range   Specimen Description BLOOD BLOOD LEFT ARM    Special Requests      BOTTLES DRAWN AEROBIC AND ANAEROBIC Blood Culture adequate volume   Culture NO GROWTH 3 DAYS    Report Status PENDING   Lipase, blood  Result Value Ref Range   Lipase 21 11 - 51 U/L  Comprehensive metabolic panel  Result Value Ref Range   Sodium 138 135 - 145 mmol/L   Potassium 4.0 3.5 - 5.1 mmol/L   Chloride 103 101 - 111 mmol/L   CO2 25 22 - 32 mmol/L   Glucose, Bld 140 (H) 65 - 99 mg/dL   BUN 27 (H) 6 - 20 mg/dL   Creatinine, Ser 1.72 (H) 0.61 - 1.24 mg/dL   Calcium 8.9 8.9  - 10.3 mg/dL   Total Protein 7.3 6.5 - 8.1 g/dL   Albumin 3.9 3.5 - 5.0 g/dL   AST 25 15 - 41 U/L   ALT 23 17 - 63 U/L   Alkaline Phosphatase 60 38 - 126 U/L   Total Bilirubin 1.8 (H) 0.3 - 1.2 mg/dL   GFR calc non Af Amer 36 (L) >60 mL/min   GFR calc Af Amer 41 (L) >60 mL/min   Anion gap 10 5 - 15  CBC  Result Value Ref Range   WBC 12.9 (H) 3.8 - 10.6 K/uL   RBC 4.29 (L) 4.40 - 5.90 MIL/uL   Hemoglobin 14.1 13.0 - 18.0 g/dL   HCT 41.1 40.0 - 52.0 %   MCV 95.8 80.0 - 100.0 fL   MCH 32.9 26.0 - 34.0 pg   MCHC 34.3 32.0 - 36.0 g/dL   RDW 13.4 11.5 - 14.5 %   Platelets 167 150 - 440 K/uL  Urinalysis, Complete w Microscopic  Result Value Ref Range   Color, Urine YELLOW (A) YELLOW   APPearance CLEAR (A) CLEAR   Specific Gravity, Urine 1.042 (H) 1.005 - 1.030   pH  6.0 5.0 - 8.0   Glucose, UA NEGATIVE NEGATIVE mg/dL   Hgb urine dipstick SMALL (A) NEGATIVE   Bilirubin Urine NEGATIVE NEGATIVE   Ketones, ur NEGATIVE NEGATIVE mg/dL   Protein, ur 30 (A) NEGATIVE mg/dL   Nitrite NEGATIVE NEGATIVE   Leukocytes, UA NEGATIVE NEGATIVE   RBC / HPF 0-5 0 - 5 RBC/hpf   WBC, UA 0-5 0 - 5 WBC/hpf   Bacteria, UA NONE SEEN NONE SEEN   Squamous Epithelial / LPF 0-5 (A) NONE SEEN   Mucus PRESENT    Hyaline Casts, UA PRESENT   APTT  Result Value Ref Range   aPTT 28 24 - 36 seconds  Protime-INR  Result Value Ref Range   Prothrombin Time 13.4 11.4 - 15.2 seconds   INR 1.03   Lactic acid, plasma  Result Value Ref Range   Lactic Acid, Venous 0.9 0.5 - 1.9 mmol/L  Magnesium  Result Value Ref Range   Magnesium 2.1 1.7 - 2.4 mg/dL  Strep pneumoniae urinary antigen  Result Value Ref Range   Strep Pneumo Urinary Antigen NEGATIVE NEGATIVE  Basic metabolic panel  Result Value Ref Range   Sodium 137 135 - 145 mmol/L   Potassium 3.7 3.5 - 5.1 mmol/L   Chloride 105 101 - 111 mmol/L   CO2 24 22 - 32 mmol/L   Glucose, Bld 119 (H) 65 - 99 mg/dL   BUN 21 (H) 6 - 20 mg/dL   Creatinine, Ser 1.18  0.61 - 1.24 mg/dL   Calcium 8.0 (L) 8.9 - 10.3 mg/dL   GFR calc non Af Amer 56 (L) >60 mL/min   GFR calc Af Amer >60 >60 mL/min   Anion gap 8 5 - 15  CBC  Result Value Ref Range   WBC 10.7 (H) 3.8 - 10.6 K/uL   RBC 3.81 (L) 4.40 - 5.90 MIL/uL   Hemoglobin 12.7 (L) 13.0 - 18.0 g/dL   HCT 35.9 (L) 40.0 - 52.0 %   MCV 94.1 80.0 - 100.0 fL   MCH 33.2 26.0 - 34.0 pg   MCHC 35.3 32.0 - 36.0 g/dL   RDW 13.8 11.5 - 14.5 %   Platelets 130 (L) 150 - 440 K/uL  Bilirubin, total  Result Value Ref Range   Total Bilirubin 1.3 (H) 0.3 - 1.2 mg/dL  Comprehensive metabolic panel  Result Value Ref Range   Sodium 135 135 - 145 mmol/L   Potassium 4.0 3.5 - 5.1 mmol/L   Chloride 105 101 - 111 mmol/L   CO2 23 22 - 32 mmol/L   Glucose, Bld 181 (H) 65 - 99 mg/dL   BUN 20 6 - 20 mg/dL   Creatinine, Ser 1.25 (H) 0.61 - 1.24 mg/dL   Calcium 8.0 (L) 8.9 - 10.3 mg/dL   Total Protein 5.9 (L) 6.5 - 8.1 g/dL   Albumin 3.0 (L) 3.5 - 5.0 g/dL   AST 66 (H) 15 - 41 U/L   ALT 91 (H) 17 - 63 U/L   Alkaline Phosphatase 89 38 - 126 U/L   Total Bilirubin 0.6 0.3 - 1.2 mg/dL   GFR calc non Af Amer 53 (L) >60 mL/min   GFR calc Af Amer >60 >60 mL/min   Anion gap 7 5 - 15      Assessment & Plan:   Problem List Items Addressed This Visit    Diarrhea    Other Visit Diagnoses    Pleural effusion associated with pulmonary infection    -  Primary  Generalized weakness       RUQ abdominal pain          Concern with patient not improved seems worsening following recent hospitalization at [facility], recently discharged 2 days ago, now has more difficulty with breathing due to RUQ / RLL pain on inspiration, concerning for pleural involvement. Last imaging Abd CT and CXR concern bibasilar pleural effusion or edema, concern may have progressed, not improving on oral antibiotics for CAP. Additional concern with poor PO intake still and GI loses with still watery diarrhea, had prior AKI that improved, now poor PO again. Similar  presentation to prior hospitalization 03/2016. - Patient did not receive [facility] therapy on discharge - Discussion on management options - agree that patient seems too uncomfortable with breathing to remain at home right now, still with GI loses and poor intake, high risk for recurrent dehydration and AKI. - Recommend return to Hospital ED for repeat evaluation and possibly re-admission, patient requests to go to alternate location, [facility] in [place], they will arrive via personal vehicle today - I recommend that patient have further imaging done especially of RLL, may need CT vs [place] and consider diuretic or thoracentesis if actual significant pleural effusion. Additionally may need alternative antibiotic vs IV, also recommend C Diff testing based on history watery diarrhea. - Patient would benefit from likely [facility] therapy vs inpatient rehab on discharge due to chronic weakness now worsening  No orders of the defined types were placed in this encounter.  Follow up plan: Return in about 2 weeks (around 01/26/2017) for hospital follow-up.  Nobie Putnam, [facility] 01/12/2017, 9:19 AM

## 2017-01-12 NOTE — ED Notes (Signed)
Patient upset due to waiting for CT, informed that multiple traumas came in and he will be taken as soon as possible. Apologized for delay. Will call CT

## 2017-01-12 NOTE — ED Notes (Signed)
Patient transported to CT 

## 2017-01-12 NOTE — Progress Notes (Signed)
ANTICOAGULATION CONSULT NOTE - Initial Consult  Pharmacy Consult for Heparin dosing Indication: pulmonary embolus  No Known Allergies  Patient Measurements:   Heparin Dosing Weight: 88.68kg  Vital Signs: Temp: 98.2 F (36.8 C) (10/09 0958) BP: 145/79 (10/09 2000) Pulse Rate: 74 (10/09 2000)  Labs:  Recent Labs  01/12/17 1003  HGB 13.7  HCT 40.1  PLT 238  CREATININE 1.18    Estimated Creatinine Clearance: 55.2 mL/min (by C-G formula based on SCr of 1.18 mg/dL).   Medical History: Past Medical History:  Diagnosis Date  . Dyslipidemia   . GERD (gastroesophageal reflux disease)   . Glaucoma   . Hyperlipidemia   . Hypertension   . Insomnia   . Myasthenia gravis ([place]) 09/08/2012  . Obesity   . Ocular myasthenia gravis ([place])   . Prostate cancer [facility])      Assessment: 50 YOM male admitted with weakness and right sided abd pain.  HGB 12.7,  Hct 35.9, Plt 130. CT Angio: Large pulmonary embolus, with possible pulmonary infarction. No bleeding reported. No anticoag PTA. PMH significant for PE 04/2016-patient was previously therapeutic on 950u/hr. Has history of multiple DVT and Pes, but was told to stop his Eliquis in August.  Goal of Therapy:  Heparin level 0.3-0.7 units/ml Monitor platelets by anticoagulation protocol: Yes   Plan:  Give 5000 units bolus x 1 Start heparin infusion at 950 units/hr  8 hour heparin level, daily heparin level and CBC. Monitor s/sx of bleeding.   Jerrye Noble, PharmD Candidate  01/12/2017,8:29 PM

## 2017-01-13 ENCOUNTER — Inpatient Hospital Stay (HOSPITAL_COMMUNITY): Payer: Medicare Other

## 2017-01-13 ENCOUNTER — Other Ambulatory Visit (HOSPITAL_COMMUNITY): Payer: Medicare Other

## 2017-01-13 DIAGNOSIS — I340 Nonrheumatic mitral (valve) insufficiency: Secondary | ICD-10-CM

## 2017-01-13 DIAGNOSIS — R197 Diarrhea, unspecified: Secondary | ICD-10-CM

## 2017-01-13 DIAGNOSIS — I2699 Other pulmonary embolism without acute cor pulmonale: Secondary | ICD-10-CM

## 2017-01-13 LAB — BASIC METABOLIC PANEL
ANION GAP: 13 (ref 5–15)
BUN: 21 mg/dL — ABNORMAL HIGH (ref 6–20)
CALCIUM: 8.5 mg/dL — AB (ref 8.9–10.3)
CO2: 20 mmol/L — ABNORMAL LOW (ref 22–32)
CREATININE: 1.19 mg/dL (ref 0.61–1.24)
Chloride: 104 mmol/L (ref 101–111)
GFR calc Af Amer: >60 mL/min (ref >60)
GFR calc non Af Amer: 56 mL/min — ABNORMAL LOW (ref >60)
GLUCOSE: 86 mg/dL (ref 65–99)
Potassium: 4.2 mmol/L (ref 3.5–5.1)
Sodium: 137 mmol/L (ref 135–145)

## 2017-01-13 LAB — CULTURE, BLOOD (ROUTINE X 2)
Culture: NO GROWTH
Culture: NO GROWTH
Special Requests: ADEQUATE

## 2017-01-13 LAB — ECHOCARDIOGRAM COMPLETE
AOASC: 35 cm
CHL CUP MV DEC (S): 250
E decel time: 250 msec
E/e' ratio: 14.32
FS: 28 % (ref 28–44)
Height: 69 in
IVS/LV PW RATIO, ED: 1.17
LA diam end sys: 46 mm
LADIAMINDEX: 2.22 cm/m2
LASIZE: 46 mm
LAVOL: 59.1 mL
LAVOLA4C: 70.8 mL
LAVOLIN: 28.5 mL/m2
LDCA: 3.14 cm2
LV E/e' medial: 14.32
LV E/e'average: 14.32
LV TDI E'LATERAL: 5.55
LV TDI E'MEDIAL: 5.11
LV e' LATERAL: 5.55 cm/s
LVOTD: 20 mm
Lateral S' vel: 21.1 cm/s
MV Peak grad: 3 mmHg
MVAP: 1.68 cm2
MVPKAVEL: 88.8 m/s
MVPKEVEL: 79.5 m/s
P 1/2 time: 73 ms
PW: 12 mm — AB (ref 0.6–1.1)
TAPSE: 24.4 mm
Weight: 3065.28 oz

## 2017-01-13 LAB — CBC
HCT: 37 % — ABNORMAL LOW (ref 39.0–52.0)
HEMOGLOBIN: 12.5 g/dL — AB (ref 13.0–17.0)
MCH: 31.3 pg (ref 26.0–34.0)
MCHC: 33.8 g/dL (ref 30.0–36.0)
MCV: 92.7 fL (ref 78.0–100.0)
PLATELETS: 223 10*3/uL (ref 150–400)
RBC: 3.99 MIL/uL — AB (ref 4.22–5.81)
RDW: 13.2 % (ref 11.5–15.5)
WBC: 6.5 10*3/uL (ref 4.0–10.5)

## 2017-01-13 LAB — MRSA PCR SCREENING: MRSA BY PCR: NEGATIVE

## 2017-01-13 LAB — HEPARIN LEVEL (UNFRACTIONATED): HEPARIN UNFRACTIONATED: 0.28 [IU]/mL — AB (ref 0.30–0.70)

## 2017-01-13 LAB — TROPONIN I
TROPONIN I: 0.05 ng/mL — AB (ref <0.03)
TROPONIN I: 0.04 ng/mL — AB (ref <0.03)

## 2017-01-13 MED ORDER — APIXABAN 5 MG PO TABS: 5.0000 mg | ORAL_TABLET | Freq: Two times a day (BID) | ORAL | Status: DC

## 2017-01-13 MED ORDER — APIXABAN 5 MG PO TABS
10.0000 mg | ORAL_TABLET | Freq: Two times a day (BID) | ORAL | Status: DC
  Administered 2017-01-13 – 2017-01-14 (×3): 10 mg via ORAL
  Filled 2017-01-13 (×3): qty 2

## 2017-01-13 MED ORDER — APIXABAN 5 MG PO TABS: ORAL_TABLET | ORAL | 4 refills | Status: DC

## 2017-01-13 NOTE — Progress Notes (Signed)
PT Cancellation Note  Patient Details Name: Elijah Lyon Sr. MRN: [id number] DOB: 1935-02-08   Cancelled Treatment:    Reason Eval/Treat Not Completed: Medical issues which prohibited therapy (Dr. Tana Coast asked to wait to evaluate pt in am. )   Denice Paradise 01/13/2017, 1:34 PM  Darien Jaelah Hauth,PT Acute Rehabilitation [phone] (pager)

## 2017-01-13 NOTE — Discharge Instructions (Signed)
Information on my medicine - ELIQUIS (apixaban)  This medication education was reviewed with me or my healthcare representative as part of my discharge preparation.  The pharmacist that spoke with me during my hospital stay was:  Betsey Holiday, [facility]  Why was Eliquis prescribed for you? Eliquis was prescribed to treat blood clots that may have been found in the veins of your legs (deep vein thrombosis) or in your lungs (pulmonary embolism) and to reduce the risk of them occurring again.  What do You need to know about Eliquis ? The starting dose is 10 mg (two 5 mg tablets) taken TWICE daily for the FIRST SEVEN (7) DAYS, then the dose is reduced to ONE 5 mg tablet taken TWICE daily.  Eliquis may be taken with or without food.   Try to take the dose about the same time in the morning and in the evening. If you have difficulty swallowing the tablet whole please discuss with your pharmacist how to take the medication safely.  Take Eliquis exactly as prescribed and DO NOT stop taking Eliquis without talking to the doctor who prescribed the medication.  Stopping may increase your risk of developing a new blood clot.  Refill your prescription before you run out.  After discharge, you should have regular check-up appointments with your healthcare provider that is prescribing your Eliquis.    What do you do if you miss a dose? If a dose of ELIQUIS is not taken at the scheduled time, take it as soon as possible on the same day and twice-daily administration should be resumed. The dose should not be doubled to make up for a missed dose.  Important Safety Information A possible side effect of Eliquis is bleeding. You should call your healthcare provider right away if you experience any of the following: ? Bleeding from an injury or your nose that does not stop. ? Unusual colored urine (red or dark brown) or unusual colored stools (red or black). ? Unusual bruising for unknown reasons. ? A  serious fall or if you hit your head (even if there is no bleeding).  Some medicines may interact with Eliquis and might increase your risk of bleeding or clotting while on Eliquis. To help avoid this, consult your healthcare provider or pharmacist prior to using any new prescription or non-prescription medications, including herbals, vitamins, non-steroidal anti-inflammatory drugs (NSAIDs) and supplements.  This website has more information on Eliquis (apixaban): http://www.eliquis.com/eliquis/home

## 2017-01-13 NOTE — Progress Notes (Addendum)
ANTICOAGULATION CONSULT NOTE - Follow Up Consult  Pharmacy Consult for Heparin  Indication: pulmonary embolus  No Known Allergies  Patient Measurements: Height: 5\' 9"  (175.3 cm) Weight: 191 lb 9.3 oz (86.9 kg) IBW/kg (Calculated) : 70.7  Vital Signs: Temp: 98.6 F (37 C) (10/10 0322) Temp Source: Oral (10/10 0322) BP: 128/75 (10/10 0322) Pulse Rate: 68 (10/10 0322)  Labs:  Recent Labs  01/12/17 1003 01/12/17 2237 01/13/17 0515  HGB 13.7  --  12.5*  HCT 40.1  --  37.0*  PLT 238  --  223  HEPARINUNFRC  --   --  0.28*  CREATININE 1.18  --   --   TROPONINI  --  0.06*  --     Estimated Creatinine Clearance: 54.5 mL/min (by C-G formula based on SCr of 1.18 mg/dL).  Assessment: 82 y/o M on heparin for large PE, heparin level is just below therapeutic range this AM, no issues per RN.   Goal of Therapy:  Heparin level 0.3-0.7 units/ml Monitor platelets by anticoagulation protocol: Yes   Plan:  Inc heparin to 1100 units/hr 1400 HL  Niyanna Asch 01/13/2017,5:59 AM

## 2017-01-13 NOTE — Care Management Note (Addendum)
Case Management Note  Patient Details  Name: Elijah Hinderman Sr. MRN: [id number] Date of Birth: 12-Aug-1934  Subjective/Objective:   Pt admitted with CP, SOB and weakness                 Action/Plan:  PTA independent from home with wife.  Pt states he has PCP Dr Edyth Gunnels at [facility] clinic - pt gets his medications from [place].  Pt will discharge home on Eliquis - pt and wife confirmed that he has never used the free 30 day card - CM provided card.  Pt plans on using the free 30 day card at [facility] in [place] ([place] verified that pharmacy has inventory for fill and that they accept the free 30 day Eliquis card).  Pt states he will contact [facility] today and set up appt within the next week to ensure he does not run out of Eliquis (he also has supply of drug at home as well) - CM also informed clinic and transport coordinator of admit.     Expected Discharge Date:                  Expected Discharge Plan:  Home/Self Care  In-House Referral:     Discharge planning Services  CM Consult  Post Acute Care Choice:    Choice offered to:     DME Arranged:    DME Agency:     HH Arranged:    HH Agency:     Status of Service:  In process, will continue to follow  If discussed at Long Length of Stay Meetings, dates discussed:    Additional Comments: Pt signed refusal to transfer to [place] form - form faxed to [place]  Pt states he does not want to transfer to [facility] hospital and requested PCP assistance for non [facility] doctor.  Maryclare Labrador, RN 01/13/2017, 10:46 AM

## 2017-01-13 NOTE — Progress Notes (Signed)
*  PRELIMINARY RESULTS* Vascular Ultrasound BIlateral lower extremity venous duplex has been completed.  Preliminary findings:  Findings consistent with acute deep vein thrombosis involving the right common femoral, profunda, popliteal, posterior tibial and peroneal veins of the right lower extremity. Chronic deep vein thrombosis is noted throughout the left lower extremity in the common femoral, femoral and popliteal veins.  Acute deep vein thrombosis is noted in the left posterior tibial veins.  Intramuscular thrombosis is noted in bilateral gastrocnemius veins.  negative for bakers cysts bilaterally.  Attempt was made to visualize inferior vena cava but majority was obscured by gas, bilateral iliac veins appear patent.  Preliminary results called to patients nurse, Mliss Sax @ 11:45.  Everrett Coombe 01/13/2017, 11:31 AM

## 2017-01-13 NOTE — Progress Notes (Signed)
[facility]                                                                              Patient Demographics  Elijah Jackson, is a 82 y.o. male, DOB - 1934-11-18, IRJ:[id number]  Admit date - 01/12/2017   Admitting Physician Phillips Grout, MD  Outpatient Primary MD for the patient is Olin Hauser, DO  Outpatient specialists:   LOS - 1  days   Medical records reviewed and are as summarized below:    Chief Complaint  Patient presents with  . Abdominal Pain  . Diarrhea       Brief summary   Per admit note by Dr. Shanon Brow on 10/9 Elijah Ponder Sr. is a 82 y.o. male with medical history significant of prev PE and DVT last year was on eloquis for 6 months stopped about 2 months ago by [place], GERD, HTN comes in after being treated for pna recently dc from [place] on abx and is just not feeling better.  He denies any fevers.  No cough but having a lot of pain with breathing on the right lower chest area.  No leg swelling or edema.  Pt had cta which shows a significant PE with possible heart strain and referred for admission for such.   Assessment & Plan    Principal Problem:   Acute Pulmonary emboli ([place]), bilateral DVT: in the setting of history of pulmonary embolism and DVT, stopped eliquis in 8/18 - presented with acute dyspnea on exertion, chest pain - CT) chest showed large right-sided pulmonary embolus with possible associated pulmonary infarction of the right lower lobe posteriorly, possible right heart strain - Doppler ultrasound of the lower extremities showed bilateral DVTs, IR consulted for question if patient can be a candidate for thrombolysis. D/w Dr Barbie Banner, will evaluate patient for further recommendations - patient was started on eliquis this morning, received 1 dose as patient had no shortness of breath, O2 sats in the 90s on room air, no lower leg swelling - 2-D echo showed EF of 65-70%, normal wall motion, grade 1 diastolic dysfunction, right  ventricle systolic function normal  Active Problems:   Myasthenia gravis ([place]) - currently stable, PT evaluation    Essential hypertension - Currently stable, continue lisinopril   chronic Diarrhea - Patient has history of intermittent diarrhea over the past 9-10 months - Currently on antibiotics, will check C. Difficile  Chronic generalized weakness, daytime sleepiness - Recommend outpatient sleep study  Recent bilateral pneumonia, discharged from [facility] on 10/7 - recommended Augmentin, stop date 10/12   Code Status: full code  DVT Prophylaxis:  eliquis Family Communication: Discussed in detail with the patient, all imaging results, lab results explained to the patient, daughter and wife   Disposition Plan:  Pending IR evaluation  Time Spent in minutes   35 minutes  Procedures:  Venous doppler LE   2D echo Impressions:  - Mild LVH with LVEF 65-70%. Grade 1 diastolic dysfunction. Mildly   dilated left atrium. Mild bileaflet mitral prolapse associated   with mild, posteriorly directed mitral regurgitation. Normal   right ventricular contraction. Trivial tricuspid regurgitation  Consultants:  Interventional radiology  Antimicrobials:      Medications  Scheduled Meds: . amoxicillin-clavulanate  1 tablet Oral TID  . apixaban  10 mg Oral BID   Followed by  . [START ON 01/20/2017] apixaban  5 mg Oral BID  . atorvastatin  10 mg Oral Daily  . feeding supplement (ENSURE ENLIVE)  237 mL Oral BID BM  . latanoprost  1 drop Both Eyes QHS  . lisinopril  20 mg Oral Daily  . predniSONE  5 mg Oral Daily  . sodium chloride flush  3 mL Intravenous Q12H   Continuous Infusions: . sodium chloride     PRN Meds:.sodium chloride, acetaminophen, ketorolac, ondansetron, oxyCODONE, sodium chloride flush   Antibiotics   Anti-infectives    Start     Dose/Rate Route Frequency Ordered Stop   01/12/17 2230  amoxicillin-clavulanate (AUGMENTIN) 500-125 MG per tablet 500 mg     1  tablet Oral 3 times daily 01/12/17 2227          Subjective:   Elijah Jackson was seen and examined today.  Denies any specific complaints, no shortness of breath, no lower extremity edema or pain. Patient denies dizziness, chest pain, shortness of breath, abdominal pain, N/V/D/C, new weakness, numbess, tingling. No acute events overnight.    Objective:   Vitals:   01/13/17 0322 01/13/17 0800 01/13/17 1200 01/13/17 1339  BP: 128/75 131/67  128/77  Pulse: 68 76 68 66  Resp: (!) 27 (!) 23 (!) 23 (!) 21  Temp: 98.6 F (37 C) 97.8 F (36.6 C)  98.4 F (36.9 C)  TempSrc: Oral Oral  Oral  SpO2: 97% 94% 96% 93%  Weight:      Height:        Intake/Output Summary (Last 24 hours) at 01/13/17 1344 Last data filed at 01/13/17 0900  Gross per 24 hour  Intake          1129.48 ml  Output              350 ml  Net           779.48 ml     Wt Readings from Last 3 Encounters:  01/12/17 86.9 kg (191 lb 9.3 oz)  01/12/17 89.4 kg (197 lb)  01/08/17 87.5 kg (192 lb 14.4 oz)     Exam  General: Alert and oriented x 3, NAD  Eyes:   HEENT:    Cardiovascular: S1 S2 auscultated, no rubs, murmurs or gallops. Regular rate and rhythm.  Respiratory: Clear to auscultation bilaterally, no wheezing, rales or rhonchi  Gastrointestinal: Soft, nontender, nondistended, + bowel sounds  Ext: no pedal edema bilaterally  Neuro: AAOx3, Cr N's II- XII. Strength 5/5 upper and lower extremities bilaterally, speech clear, sensations grossly intact  Musculoskeletal: No digital cyanosis, clubbing  Skin: No rashes  Psych: Normal affect and demeanor, alert and oriented x3    Data Reviewed:  I have personally reviewed following labs and imaging studies  Micro Results Recent Results (from the past 240 hour(s))  Blood Culture (routine x 2)     Status: None   Collection Time: 01/08/17 11:05 AM  Result Value Ref Range Status   Specimen Description BLOOD  Final   Special Requests NONE  Final    Culture NO GROWTH 5 DAYS  Final   Report Status 01/13/2017 FINAL  Final  Blood Culture (routine x 2)     Status: None   Collection Time: 01/08/17 11:10 AM  Result Value Ref Range Status  Specimen Description BLOOD BLOOD LEFT ARM  Final   Special Requests   Final    BOTTLES DRAWN AEROBIC AND ANAEROBIC Blood Culture adequate volume   Culture NO GROWTH 5 DAYS  Final   Report Status 01/13/2017 FINAL  Final  MRSA PCR Screening     Status: None   Collection Time: 01/12/17 10:14 PM  Result Value Ref Range Status   MRSA by PCR NEGATIVE NEGATIVE Final    Comment:        The GeneXpert MRSA Assay (FDA approved for NASAL specimens only), is one component of a comprehensive MRSA colonization surveillance program. It is not intended to diagnose MRSA infection nor to guide or monitor treatment for MRSA infections.     Radiology Reports Dg Chest 1 View  Result Date: 01/08/2017 CLINICAL DATA:  Pt has had constant, moderate to severe right upper and right sided abdominal pain. Denies nausea or vomiting; C/o SOB Concern for pneumonia HTN, former smoker EXAM: CHEST 1 VIEW COMPARISON:  CT 07/30/2016 FINDINGS: Normal cardiac silhouette. There is fine airspace disease in LEFT RIGHT lower lobe. Small effusions. No pneumothorax. No acute osseous abnormality. IMPRESSION: Fine bibasilar airspace disease suggests mild pulmonary edema/interstitial edema. Small effusions. Electronically Signed   By: Suzy Bouchard M.D.   On: 01/08/2017 11:15   Dg Chest 2 View  Result Date: 01/12/2017 CLINICAL DATA:  Cough. Right-sided pain. Not improving with antibiotics. EXAM: CHEST  2 VIEW COMPARISON:  Four days ago FINDINGS: There is a progressively more well-defined subpleural opacity in the right lower lobe. Question hazy lingular opacity. Normal heart size. Negative mediastinal contours. No visible effusion or pneumothorax. These results were called by telephone at the time of interpretation on 01/12/2017 at 5:22 pm to  Dr. Davonna Belling , who verbally acknowledged these results. IMPRESSION: 1. Distinct subpleural opacity in the right lower lobe. Patient has history of pneumonia and this could reflect progression. Consider alternate diagnosis such as lung infarct, which can also give this radiographic appearance. 2. Mild lingular opacification presumably related to the same. Electronically Signed   By: Monte Fantasia M.D.   On: 01/12/2017 17:22   Ct Angio Chest Pe W And/or Wo Contrast  Result Date: 01/12/2017 CLINICAL DATA:  Shortness of breath. EXAM: CT ANGIOGRAPHY CHEST WITH CONTRAST TECHNIQUE: Multidetector CT imaging of the chest was performed using the standard protocol during bolus administration of intravenous contrast. Multiplanar CT image reconstructions and MIPs were obtained to evaluate the vascular anatomy. CONTRAST:  100 mL of Isovue 370 intravenously. COMPARISON:  CT scan of July 30, 2016. FINDINGS: Cardiovascular: Atherosclerosis of thoracic aorta is noted without aneurysm formation. Coronary artery calcifications are noted. Large linear filling defect is seen at the bifurcation the right pulmonary artery, extending into lower lobe and upper lobe branches. RV/LV ratio of greater than 1.5 is noted which may be due to thickness of left ventricular wall, although right heart strain cannot be excluded. Mediastinum/Nodes: No enlarged mediastinal, hilar, or axillary lymph nodes. Thyroid gland, trachea, and esophagus demonstrate no significant findings. Lungs/Pleura: No pneumothorax is noted. Left lung is clear. Mild right posterior basilar opacity is noted which may represent pulmonary infarction. Upper Abdomen: No acute abnormality. Musculoskeletal: No chest wall abnormality. No acute or significant osseous findings. Review of the MIP images confirms the above findings. IMPRESSION: Large right-sided pulmonary embolus is noted with possible associated pulmonary infarction of right lower lobe posteriorly. RV/LV  ratio suggesting possible right heart strain. Critical Value/emergent results were called by telephone at the time  of interpretation on 01/12/2017 at 7:20 pm to Dr. Davonna Belling , who verbally acknowledged these results. Coronary artery calcifications are noted. Aortic Atherosclerosis (ICD10-I70.0). Electronically Signed   By: Marijo Conception, M.D.   On: 01/12/2017 19:20   Ct Abdomen Pelvis W Contrast  Result Date: 01/08/2017 CLINICAL DATA:  Abdominal pain, prostate cancer, prior cholecystectomy EXAM: CT ABDOMEN AND PELVIS WITH CONTRAST TECHNIQUE: Multidetector CT imaging of the abdomen and pelvis was performed using the standard protocol following bolus administration of intravenous contrast. CONTRAST:  45mL ISOVUE-300 IOPAMIDOL (ISOVUE-300) INJECTION 61% COMPARISON:  04/14/2016 FINDINGS: Lower chest: Patchy bilateral lower lobe opacities, suspicious for pneumonia. Hepatobiliary: Liver is within normal limits. Status post cholecystectomy. No intrahepatic or extrahepatic ductal dilatation. Pancreas: Within normal limits. Spleen: Within normal limits. Adrenals/Urinary Tract: Adrenal glands within normal limits. Kidneys are within normal limits.  No hydronephrosis. Bladder is within normal limits. Stomach/Bowel: Stomach notable for a small hiatal hernia. No evidence of bowel obstruction. Normal appendix (series 2/ image 58). Left colonic diverticulosis, without evidence of diverticulitis. Vascular/Lymphatic: No evidence of abdominal aortic aneurysm. Atherosclerotic calcifications of the abdominal aorta and branch vessels. No suspicious abdominopelvic lymphadenopathy. Reproductive: Brachytherapy seeds in the prostate. Other: No abdominopelvic ascites. Small fat containing right inguinal hernia (series 2/ image 81). Moderate fat containing left inguinal/femoral hernia (series 2/ image 85). Musculoskeletal: Degenerative changes of the visualized thoracolumbar spine. a probable benign bone island in the left  parasymphyseal region (series 2/ image 87), unchanged since 2008. Additional tiny sclerotic foci in the and left iliac bone (series 2/image 62) and left L4 vertebral body (series 2/ image 48) are new from 2008 but similar to 2015, possibly reflecting additional bone islands. IMPRESSION: Patchy bilateral lower lobe opacities, suspicious for pneumonia. No evidence of bowel obstruction.  Normal appendix. Left colonic diverticulosis, without evidence of diverticulitis. Brachytherapy seeds in the prostate. No findings specific for metastatic disease. Additional ancillary findings as above. Electronically Signed   By: Julian Hy M.D.   On: 01/08/2017 10:08   US Abdomen Limited  Result Date: 01/12/2017 CLINICAL DATA:  82 year old male with history of right upper quadrant pain for the past 4 days. EXAM: ULTRASOUND ABDOMEN LIMITED RIGHT UPPER QUADRANT COMPARISON:  Abdominal ultrasound 01/07/2014. CT the abdomen pelvis 01/08/2017. FINDINGS: Gallbladder: Status post cholecystectomy. Common bile duct: Diameter: 6.4 mm Liver: No focal lesion identified. Within normal limits in parenchymal echogenicity. Portal vein is patent on color Doppler imaging with normal direction of blood flow towards the liver. IMPRESSION: 1. No acute findings noted to account for the patient's symptoms. 2. Status post cholecystectomy. Electronically Signed   By: Vinnie Langton M.D.   On: 01/12/2017 17:51    Lab Data:  CBC:  Recent Labs Lab 01/08/17 0656 01/09/17 0438 01/12/17 1003 01/13/17 0515  WBC 12.9* 10.7* 8.8 6.5  HGB 14.1 12.7* 13.7 12.5*  HCT 41.1 35.9* 40.1 37.0*  MCV 95.8 94.1 92.4 92.7  PLT 167 130* 238 829   Basic Metabolic Panel:  Recent Labs Lab 01/08/17 0656 01/09/17 0438 01/09/17 1420 01/12/17 1003 01/13/17 0515  NA 138 137 135 137 137  K 4.0 3.7 4.0 4.1 4.2  CL 103 105 105 106 104  CO2 25 24 23  18* 20*  GLUCOSE 140* 119* 181* 120* 86  BUN 27* 21* 20 20 21*  CREATININE 1.72* 1.18 1.25* 1.18  1.19  CALCIUM 8.9 8.0* 8.0* 8.8* 8.5*  MG 2.1  --   --   --   --    GFR: Estimated  Creatinine Clearance: 54.1 mL/min (by C-G formula based on SCr of 1.19 mg/dL). Liver Function Tests:  Recent Labs Lab 01/08/17 0656 01/09/17 0438 01/09/17 1420 01/12/17 1003  AST 25  --  66* 86*  ALT 23  --  91* 116*  ALKPHOS 60  --  89 137*  BILITOT 1.8* 1.3* 0.6 1.2  PROT 7.3  --  5.9* 6.8  ALBUMIN 3.9  --  3.0* 3.2*    Recent Labs Lab 01/08/17 0656 01/12/17 1003  LIPASE 21 28   No results for input(s): AMMONIA in the last 168 hours. Coagulation Profile:  Recent Labs Lab 01/08/17 0720  INR 1.03   Cardiac Enzymes:  Recent Labs Lab 01/12/17 2237 01/13/17 0515 01/13/17 1014  TROPONINI 0.06* 0.05* 0.04*   BNP (last 3 results) No results for input(s): PROBNP in the last 8760 hours. HbA1C: No results for input(s): HGBA1C in the last 72 hours. CBG: No results for input(s): GLUCAP in the last 168 hours. Lipid Profile: No results for input(s): CHOL, HDL, LDLCALC, TRIG, CHOLHDL, LDLDIRECT in the last 72 hours. Thyroid Function Tests: No results for input(s): TSH, T4TOTAL, FREET4, T3FREE, THYROIDAB in the last 72 hours. Anemia Panel: No results for input(s): VITAMINB12, FOLATE, FERRITIN, TIBC, IRON, RETICCTPCT in the last 72 hours. Urine analysis:    Component Value Date/Time   COLORURINE YELLOW 01/12/2017 1526   APPEARANCEUR HAZY (A) 01/12/2017 1526   APPEARANCEUR Clear 01/07/2014 1435   LABSPEC 1.025 01/12/2017 1526   LABSPEC 1.020 01/07/2014 1435   PHURINE 5.0 01/12/2017 1526   GLUCOSEU NEGATIVE 01/12/2017 1526   GLUCOSEU Negative 01/07/2014 1435   HGBUR SMALL (A) 01/12/2017 1526   BILIRUBINUR NEGATIVE 01/12/2017 1526   BILIRUBINUR Negative 01/07/2014 1435   KETONESUR 20 (A) 01/12/2017 1526   PROTEINUR NEGATIVE 01/12/2017 1526   NITRITE NEGATIVE 01/12/2017 1526   LEUKOCYTESUR NEGATIVE 01/12/2017 1526   LEUKOCYTESUR Negative 01/07/2014 [place]  M.D. [facility] 01/13/2017, 1:44 PM  Pager: [phone] Between 7am to 7pm - call Pager - [phone]  After 7pm go to [email or web address] - password [email or web address]  Call night coverage person covering after 7pm

## 2017-01-13 NOTE — Progress Notes (Signed)
Patient ID: Elijah Ponder Sr., male   DOB: 1934/05/21, 82 y.o.   MRN: [id number]    Referring Physician(s): LE DVT  Supervising Physician: Marybelle Killings  Patient Status: [facility] - In-pt  Chief Complaint: B DVTs  Subjective: [name] is a pleasant 82 yo male who was admitted to [facility] over the weekend secondary to what was called PNA.  His wife states he was having some soreness in his legs when ambulating at the time, but the patient generally denies this.  He was discharged Sunday and followed up with his PCP on Monday.  He was still having some pain in his chest with breathing and was sen to [facility] for evaluation.  He had a CTA that revealed an acute PE.  He was started on a heparin drip, and ultimately on eliquis.  He had dopplers done which revealed some acute DVT of the left and right leg.  The patient states his legs are not swollen, tender, or red. He states he can mobilize with no problems.  IR was asked to evaluate him to determine the need for DVT lysis.  Allergies: Patient has no known allergies.  Medications: Prior to Admission medications   Medication Sig Start Date End Date Taking? Authorizing Provider  acetaminophen (TYLENOL) 325 MG tablet Take 1 tablet (325 mg total) by mouth every 6 (six) hours as needed for mild pain (or Fever >/= 101). 01/10/17  Yes Gouru, Illene Silver, MD  amoxicillin-clavulanate (AUGMENTIN) 500-125 MG tablet Take 1 tablet (500 mg total) by mouth 3 (three) times daily. 01/10/17 01/15/17 Yes Gouru, Illene Silver, MD  atorvastatin (LIPITOR) 10 MG tablet Take 10 mg by mouth daily.   Yes [provider]  feeding supplement, ENSURE ENLIVE, (ENSURE ENLIVE) LIQD Take 237 mLs by mouth 2 (two) times daily between meals. 01/10/17  Yes Gouru, Aruna, MD  latanoprost (XALATAN) 0.005 % ophthalmic solution Place 1 drop into both eyes at bedtime.   Yes [provider]  lisinopril (PRINIVIL,ZESTRIL) 40 MG tablet Take 0.5 tablets (20 mg total) by mouth daily. 01/10/17  Yes  Gouru, Illene Silver, MD  omeprazole (PRILOSEC) 20 MG capsule Take 2 capsules (40 mg total) by mouth 2 (two) times daily before a meal. 04/17/16  Yes Rizwan, Eunice Blase, MD  ondansetron (ZOFRAN ODT) 4 MG disintegrating tablet Take 1 tablet (4 mg total) by mouth every 8 (eight) hours as needed for nausea or vomiting. 12/24/16  Yes Karamalegos, Devonne Doughty, DO  predniSONE (DELTASONE) 5 MG tablet Take 5 mg by mouth daily. 05/15/14  Yes [provider]  apixaban (ELIQUIS) 5 MG TABS tablet Take eliquis 2 tabs (10mg ) twice a day 7 days.Then on 01/20/17, please take 1 tab twice a day 01/13/17   Rai, Ripudeep K, MD  senna-docusate (SENOKOT-S) 8.6-50 MG tablet Take 1 tablet by mouth at bedtime as needed for mild constipation. Patient not taking: Reported on 01/12/2017 01/10/17   Nicholes Mango, MD    Vital Signs: BP 128/77 (BP Location: Right Arm)   Pulse 66   Temp 98.4 F (36.9 C) (Oral)   Resp (!) 21   Ht 5\' 9"  (1.753 m)   Wt 191 lb 9.3 oz (86.9 kg)   SpO2 93%   BMI 28.29 kg/m   Physical Exam: Ext: bilateral lower extremities appear completely normal, no tenderness, no erythema, or edema.  Imaging: Dg Chest 2 View  Result Date: 01/12/2017 CLINICAL DATA:  Cough. Right-sided pain. Not improving with antibiotics. EXAM: CHEST  2 VIEW COMPARISON:  Four days ago FINDINGS: There  is a progressively more well-defined subpleural opacity in the right lower lobe. Question hazy lingular opacity. Normal heart size. Negative mediastinal contours. No visible effusion or pneumothorax. These results were called by telephone at the time of interpretation on 01/12/2017 at 5:22 pm to Dr. Davonna Belling , who verbally acknowledged these results. IMPRESSION: 1. Distinct subpleural opacity in the right lower lobe. Patient has history of pneumonia and this could reflect progression. Consider alternate diagnosis such as lung infarct, which can also give this radiographic appearance. 2. Mild lingular opacification presumably related  to the same. Electronically Signed   By: Monte Fantasia M.D.   On: 01/12/2017 17:22   Ct Angio Chest Pe W And/or Wo Contrast  Result Date: 01/12/2017 CLINICAL DATA:  Shortness of breath. EXAM: CT ANGIOGRAPHY CHEST WITH CONTRAST TECHNIQUE: Multidetector CT imaging of the chest was performed using the standard protocol during bolus administration of intravenous contrast. Multiplanar CT image reconstructions and MIPs were obtained to evaluate the vascular anatomy. CONTRAST:  100 mL of Isovue 370 intravenously. COMPARISON:  CT scan of July 30, 2016. FINDINGS: Cardiovascular: Atherosclerosis of thoracic aorta is noted without aneurysm formation. Coronary artery calcifications are noted. Large linear filling defect is seen at the bifurcation the right pulmonary artery, extending into lower lobe and upper lobe branches. RV/LV ratio of greater than 1.5 is noted which may be due to thickness of left ventricular wall, although right heart strain cannot be excluded. Mediastinum/Nodes: No enlarged mediastinal, hilar, or axillary lymph nodes. Thyroid gland, trachea, and esophagus demonstrate no significant findings. Lungs/Pleura: No pneumothorax is noted. Left lung is clear. Mild right posterior basilar opacity is noted which may represent pulmonary infarction. Upper Abdomen: No acute abnormality. Musculoskeletal: No chest wall abnormality. No acute or significant osseous findings. Review of the MIP images confirms the above findings. IMPRESSION: Large right-sided pulmonary embolus is noted with possible associated pulmonary infarction of right lower lobe posteriorly. RV/LV ratio suggesting possible right heart strain. Critical Value/emergent results were called by telephone at the time of interpretation on 01/12/2017 at 7:20 pm to Dr. Davonna Belling , who verbally acknowledged these results. Coronary artery calcifications are noted. Aortic Atherosclerosis (ICD10-I70.0). Electronically Signed   By: Marijo Conception, M.D.    On: 01/12/2017 19:20   US Abdomen Limited  Result Date: 01/12/2017 CLINICAL DATA:  83 year old male with history of right upper quadrant pain for the past 4 days. EXAM: ULTRASOUND ABDOMEN LIMITED RIGHT UPPER QUADRANT COMPARISON:  Abdominal ultrasound 01/07/2014. CT the abdomen pelvis 01/08/2017. FINDINGS: Gallbladder: Status post cholecystectomy. Common bile duct: Diameter: 6.4 mm Liver: No focal lesion identified. Within normal limits in parenchymal echogenicity. Portal vein is patent on color Doppler imaging with normal direction of blood flow towards the liver. IMPRESSION: 1. No acute findings noted to account for the patient's symptoms. 2. Status post cholecystectomy. Electronically Signed   By: Vinnie Langton M.D.   On: 01/12/2017 17:51    Labs:  CBC:  Recent Labs  01/08/17 0656 01/09/17 0438 01/12/17 1003 01/13/17 0515  WBC 12.9* 10.7* 8.8 6.5  HGB 14.1 12.7* 13.7 12.5*  HCT 41.1 35.9* 40.1 37.0*  PLT 167 130* 238 223    COAGS:  Recent Labs  01/08/17 0720  INR 1.03  APTT 28    BMP:  Recent Labs  01/09/17 0438 01/09/17 1420 01/12/17 1003 01/13/17 0515  NA 137 135 137 137  K 3.7 4.0 4.1 4.2  CL 105 105 106 104  CO2 24 23 18* 20*  GLUCOSE 119* 181*  120* 86  BUN 21* 20 20 21*  CALCIUM 8.0* 8.0* 8.8* 8.5*  CREATININE 1.18 1.25* 1.18 1.19  GFRNONAA 56* 53* 56* 56*  GFRAA >60 >60 >60 >60    LIVER FUNCTION TESTS:  Recent Labs  04/14/16 1633 01/08/17 0656 01/09/17 0438 01/09/17 1420 01/12/17 1003  BILITOT 0.2* 1.8* 1.3* 0.6 1.2  AST 22 25  --  66* 86*  ALT 15* 23  --  91* 116*  ALKPHOS 73 60  --  89 137*  PROT 6.5 7.3  --  5.9* 6.8  ALBUMIN 3.1* 3.9  --  3.0* 3.2*    Assessment and Plan: 1. Acute bilateral LE DVTs  Dr. Barbie Banner has reviewed this case with Dr. Tana Coast.  I have also seen and evaluated the patient.  He is currently relatively asymptomatic in both legs at this time.  He has been started on eliquis.  Given he is asymptomatic, it is felt that  proceeding with a DVT lysis at this time would be of little benefit.  This was relayed to Dr. Tana Coast.  Continue conservative measures for now.  If he were to acutely worsen then consideration of a lysis could be considered.   Electronically Signed: Henreitta Cea 01/13/2017, 3:24 PM   I spent a total of 25 Minutes at the the patient's bedside AND on the patient's hospital floor or unit, greater than 50% of which was counseling/coordinating care for bilateral LE DVTs

## 2017-01-13 NOTE — Progress Notes (Signed)
  Echocardiogram 2D Echocardiogram has been performed.  Elijah Jackson G Elijah Jackson 01/13/2017, 9:51 AM

## 2017-01-13 NOTE — Progress Notes (Signed)
ANTICOAGULATION CONSULT NOTE - Follow Up Consult  Pharmacy Consult for Heparin  Indication: pulmonary embolus  No Known Allergies  Patient Measurements: Height: 5\' 9"  (175.3 cm) Weight: 191 lb 9.3 oz (86.9 kg) IBW/kg (Calculated) : 70.7  Vital Signs: Temp: 97.8 F (36.6 C) (10/10 0800) Temp Source: Oral (10/10 0800) BP: 131/67 (10/10 0800) Pulse Rate: 76 (10/10 0800)  Labs:  Recent Labs  01/12/17 1003 01/12/17 2237 01/13/17 0515  HGB 13.7  --  12.5*  HCT 40.1  --  37.0*  PLT 238  --  223  HEPARINUNFRC  --   --  0.28*  CREATININE 1.18  --  1.19  TROPONINI  --  0.06* 0.05*    Estimated Creatinine Clearance: 54.1 mL/min (by C-G formula based on SCr of 1.19 mg/dL).  Assessment: 82 y/o M was started on heparin last night for large R sided PE with possible heart strain noted on CT. He had been previous PE and DVT on eliquis for 6 months before being discontinued 2 months ago- not considered treatment failure since had been off anticoagulation at the time of new PE. CBC remains stable. No signs/symptoms of bleeding noted.    Goal of Therapy:  Monitor platelets by anticoagulation protocol: Yes   Plan:  Decrease heparin infusion Start apixaban 10 mg at time of heparin discontinuation Plan to start apixaban 10 mg twice daily for 7 days then 5 mg twice daily Monitor renal function and signs/symptoms of bleeding  Doylene Canard, PharmD Clinical Pharmacist  Phone: [phone] 01/13/2017,8:58 AM

## 2017-01-14 LAB — CBC
HEMATOCRIT: 35.2 % — AB (ref 39.0–52.0)
Hemoglobin: 11.9 g/dL — ABNORMAL LOW (ref 13.0–17.0)
MCH: 31.2 pg (ref 26.0–34.0)
MCHC: 33.8 g/dL (ref 30.0–36.0)
MCV: 92.1 fL (ref 78.0–100.0)
PLATELETS: 300 10*3/uL (ref 150–400)
RBC: 3.82 MIL/uL — ABNORMAL LOW (ref 4.22–5.81)
RDW: 13.2 % (ref 11.5–15.5)
WBC: 7.1 10*3/uL (ref 4.0–10.5)

## 2017-01-14 MED ORDER — APIXABAN 5 MG PO TABS: ORAL_TABLET | ORAL | 5 refills | Status: DC

## 2017-01-14 NOTE — Progress Notes (Signed)
Discharge instructions given to patient and patients wife, all questions answered at this time.  Prescription and work note given to patient.  Pt. VSS with no s/s of distress noted.  Patient stable at discharge.

## 2017-01-14 NOTE — Discharge Summary (Signed)
Physician Discharge Summary   Patient ID: Elijah Muldrew Sr. MRN: [id number] DOB/AGE: 80-Dec-1936 80 y.o.  Admit date: 01/12/2017 Discharge date: 01/14/2017  Primary Care Physician:  Olin Hauser, DO  Discharge Diagnoses:    . Essential hypertension . Myasthenia gravis ([place]) . ACUTE Pulmonary emboli ([facility])  ac bilateral DVT . Diarrhea   Consults:  Interventional radiology  Recommendations for Outpatient Follow-up:  1. Please repeat CBC/BMET at next visit 2. Patient restarted on eliquis, therapeutic dose to continue lifelong antiemetic medication   DIET: heart healthy diet    Allergies:  No Known Allergies   DISCHARGE MEDICATIONS: Current Discharge Medication List    START taking these medications   Details  apixaban (ELIQUIS) 5 MG TABS tablet Take eliquis 2 tabs (10mg ) twice a day 7 days.Then on 01/20/17, please take 1 tab twice a day Qty: 60 tablet, Refills: 5      CONTINUE these medications which have NOT CHANGED   Details  acetaminophen (TYLENOL) 325 MG tablet Take 1 tablet (325 mg total) by mouth every 6 (six) hours as needed for mild pain (or Fever >/= 101).    amoxicillin-clavulanate (AUGMENTIN) 500-125 MG tablet Take 1 tablet (500 mg total) by mouth 3 (three) times daily. Qty: 15 tablet, Refills: 0    atorvastatin (LIPITOR) 10 MG tablet Take 10 mg by mouth daily.    feeding supplement, ENSURE ENLIVE, (ENSURE ENLIVE) LIQD Take 237 mLs by mouth 2 (two) times daily between meals. Qty: 60 Bottle, Refills: 0    latanoprost (XALATAN) 0.005 % ophthalmic solution Place 1 drop into both eyes at bedtime.    lisinopril (PRINIVIL,ZESTRIL) 40 MG tablet Take 0.5 tablets (20 mg total) by mouth daily. Qty: 30 tablet, Refills: 0    omeprazole (PRILOSEC) 20 MG capsule Take 2 capsules (40 mg total) by mouth 2 (two) times daily before a meal. Qty: 60 capsule, Refills: 0    ondansetron (ZOFRAN ODT) 4 MG disintegrating tablet Take 1 tablet (4 mg total) by mouth  every 8 (eight) hours as needed for nausea or vomiting. Qty: 30 tablet, Refills: 1   Associated Diagnoses: Nausea    predniSONE (DELTASONE) 5 MG tablet Take 5 mg by mouth daily.    senna-docusate (SENOKOT-S) 8.6-50 MG tablet Take 1 tablet by mouth at bedtime as needed for mild constipation.         Brief H and P: For complete details please refer to admission H and P, but in brief Per admit note by Dr. Shanon Brow on 10/9 Elijah Jacksonis a 82 y.o.malewith medical history significant of prev PE and DVT last year was on eloquis for 6 months stopped about 2 months ago by [place], GERD, HTN comes in after being treated for pna recently dc from [place] on abx and is just not feeling better. He denies any fevers. No cough but having a lot of pain with breathing on the right lower chest area. No leg swelling or edema. Pt had cta which shows a significant PE with possible heart strain and referred for admission for such.   Hospital Course:   Acute Pulmonary emboli ([place]), bilateral DVT: in the setting of history of pulmonary embolism and DVT, stopped eliquis in 8/18 - presented with acute dyspnea on exertion, chest pain - CT chest showed large right-sided pulmonary embolus with possible associated pulmonary infarction of the right lower lobe posteriorly, possible right heart strain - Doppler ultrasound of the lower extremities showed bilateral DVTs - patient was started on therapeutic eliquis and recommended  to continue lifelong anticoagulation. Follow outpatient with pulmonology, Dr Vaughan Browner - 2-D echo showed EF of 65-70%, normal wall motion, grade 1 diastolic dysfunction, right ventricle systolic function normal - IR was consulted for possible thrombolysis of the bilateral DVTs. Given patient is asymptomatic, does not have any leg swelling and overall improving, it was felt that proceeding with DVT lysis at this time would be of little benefit. He was recommended to continue conservative measures  and anticoagulation. If patient's condition worsens then lysis could be considered in future.      Myasthenia gravis ([place]) - currently stable, patient ambulating in the hallway without any difficulty.    Essential hypertension - Currently stable, continue lisinopril   chronic Diarrhea - Patient has history of intermittent diarrhea over the past 9-10 months -currently stable,C. Difficile was ordered however patient did not have any diarrhea while inpatient  Chronic generalized weakness, daytime sleepiness - Recommend outpatient sleep study  Recent bilateral pneumonia, [facility] on 10/7 - recommended Augmentin, stop date 10/12   Day of Discharge BP 118/82 (BP Location: Right Arm)   Pulse 80   Temp 98.2 F (36.8 C) (Oral)   Resp 19   Ht 5\' 9"  (1.753 m)   Wt 86.9 kg (191 lb 9.3 oz)   SpO2 94%   BMI 28.29 kg/m   Physical Exam: General: Alert and awake oriented x3 not in any acute distress. HEENT: anicteric sclera, pupils reactive to light and accommodation CVS: S1-S2 clear no murmur rubs or gallops Chest: clear to auscultation bilaterally, no wheezing rales or rhonchi Abdomen: soft nontender, nondistended, normal bowel sounds Extremities: no cyanosis, clubbing or edema noted bilaterally Neuro: Cranial nerves II-XII intact, no focal neurological deficits   The results of significant diagnostics from this hospitalization (including imaging, microbiology, ancillary and laboratory) are listed below for reference.    LAB RESULTS: Basic Metabolic Panel:  Recent Labs Lab 01/08/17 0656  01/12/17 1003 01/13/17 0515  NA 138  < > 137 137  K 4.0  < > 4.1 4.2  CL 103  < > 106 104  CO2 25  < > 18* 20*  GLUCOSE 140*  < > 120* 86  BUN 27*  < > 20 21*  CREATININE 1.72*  < > 1.18 1.19  CALCIUM 8.9  < > 8.8* 8.5*  MG 2.1  --   --   --   < > = values in this interval not displayed. Liver Function Tests:  Recent Labs Lab 01/09/17 1420 01/12/17 1003  AST 66*  86*  ALT 91* 116*  ALKPHOS 89 137*  BILITOT 0.6 1.2  PROT 5.9* 6.8  ALBUMIN 3.0* 3.2*    Recent Labs Lab 01/08/17 0656 01/12/17 1003  LIPASE 21 28   No results for input(s): AMMONIA in the last 168 hours. CBC:  Recent Labs Lab 01/13/17 0515 01/14/17 0322  WBC 6.5 7.1  HGB 12.5* 11.9*  HCT 37.0* 35.2*  MCV 92.7 92.1  PLT 223 300   Cardiac Enzymes:  Recent Labs Lab 01/13/17 0515 01/13/17 1014  TROPONINI 0.05* 0.04*   BNP: Invalid input(s): POCBNP CBG: No results for input(s): GLUCAP in the last 168 hours.  Significant Diagnostic Studies:  Dg Chest 2 View  Result Date: 01/12/2017 CLINICAL DATA:  Cough. Right-sided pain. Not improving with antibiotics. EXAM: CHEST  2 VIEW COMPARISON:  Four days ago FINDINGS: There is a progressively more well-defined subpleural opacity in the right lower lobe. Question hazy lingular opacity. Normal heart size. Negative mediastinal contours.  No visible effusion or pneumothorax. These results were called by telephone at the time of interpretation on 01/12/2017 at 5:22 pm to Dr. Davonna Belling , who verbally acknowledged these results. IMPRESSION: 1. Distinct subpleural opacity in the right lower lobe. Patient has history of pneumonia and this could reflect progression. Consider alternate diagnosis such as lung infarct, which can also give this radiographic appearance. 2. Mild lingular opacification presumably related to the same. Electronically Signed   By: Monte Fantasia M.D.   On: 01/12/2017 17:22   Ct Angio Chest Pe W And/or Wo Contrast  Result Date: 01/12/2017 CLINICAL DATA:  Shortness of breath. EXAM: CT ANGIOGRAPHY CHEST WITH CONTRAST TECHNIQUE: Multidetector CT imaging of the chest was performed using the standard protocol during bolus administration of intravenous contrast. Multiplanar CT image reconstructions and MIPs were obtained to evaluate the vascular anatomy. CONTRAST:  100 mL of Isovue 370 intravenously. COMPARISON:  CT scan  of July 30, 2016. FINDINGS: Cardiovascular: Atherosclerosis of thoracic aorta is noted without aneurysm formation. Coronary artery calcifications are noted. Large linear filling defect is seen at the bifurcation the right pulmonary artery, extending into lower lobe and upper lobe branches. RV/LV ratio of greater than 1.5 is noted which may be due to thickness of left ventricular wall, although right heart strain cannot be excluded. Mediastinum/Nodes: No enlarged mediastinal, hilar, or axillary lymph nodes. Thyroid gland, trachea, and esophagus demonstrate no significant findings. Lungs/Pleura: No pneumothorax is noted. Left lung is clear. Mild right posterior basilar opacity is noted which may represent pulmonary infarction. Upper Abdomen: No acute abnormality. Musculoskeletal: No chest wall abnormality. No acute or significant osseous findings. Review of the MIP images confirms the above findings. IMPRESSION: Large right-sided pulmonary embolus is noted with possible associated pulmonary infarction of right lower lobe posteriorly. RV/LV ratio suggesting possible right heart strain. Critical Value/emergent results were called by telephone at the time of interpretation on 01/12/2017 at 7:20 pm to Dr. Davonna Belling , who verbally acknowledged these results. Coronary artery calcifications are noted. Aortic Atherosclerosis (ICD10-I70.0). Electronically Signed   By: Marijo Conception, M.D.   On: 01/12/2017 19:20   US Abdomen Limited  Result Date: 01/12/2017 CLINICAL DATA:  82 year old male with history of right upper quadrant pain for the past 4 days. EXAM: ULTRASOUND ABDOMEN LIMITED RIGHT UPPER QUADRANT COMPARISON:  Abdominal ultrasound 01/07/2014. CT the abdomen pelvis 01/08/2017. FINDINGS: Gallbladder: Status post cholecystectomy. Common bile duct: Diameter: 6.4 mm Liver: No focal lesion identified. Within normal limits in parenchymal echogenicity. Portal vein is patent on color Doppler imaging with normal  direction of blood flow towards the liver. IMPRESSION: 1. No acute findings noted to account for the patient's symptoms. 2. Status post cholecystectomy. Electronically Signed   By: Vinnie Langton M.D.   On: 01/12/2017 17:51    2D ECHO: Study Conclusions  - Left ventricle: The cavity size was normal. Wall thickness was   increased in a pattern of mild LVH. Systolic function was   vigorous. The estimated ejection fraction was in the range of 65%   to 70%. Wall motion was normal; there were no regional wall   motion abnormalities. Doppler parameters are consistent with   abnormal left ventricular relaxation (grade 1 diastolic   dysfunction). - Mitral valve: Mild prolapse, involving the anterior leaflet and   the posterior leaflet. There was mild regurgitation directed   posteriorly. Valve area by pressure half-time: 1.68 cm^2. - Left atrium: The atrium was mildly dilated. - Right ventricle: Systolic function was normal. -  Right atrium: Central venous pressure (est): 3 mm Hg. - Atrial septum: No defect or patent foramen ovale was identified. - Tricuspid valve: There was trivial regurgitation. - Pulmonary arteries: Systolic pressure could not be accurately   estimated. - Pericardium, extracardiac: There was no pericardial effusion.   Disposition and Follow-up: Discharge Instructions    Diet - low sodium heart healthy    Complete by:  As directed    Discharge instructions    Complete by:  As directed    Finish your antibiotic on 01/15/17   Increase activity slowly    Complete by:  As directed        DISPOSITION: home    [place] Follow up.   Contact information: Please call [phone] and ask for assistance with locating primary care doctor within the [facility].  Also, you can        Olin Hauser, DO. Schedule an appointment as soon as possible for a visit in 2 week(s).   Specialty:  Family  Medicine Contact information: [place] [address] [phone]        Marshell Garfinkel, MD. Schedule an appointment as soon as possible for a visit in 4 week(s).   Specialty:  Pulmonary Disease Contact information: [address] [place] [address] [phone]            Time spent on Discharge: 50mins   Signed:   Estill Cotta M.D. [facility] 01/14/2017, 8:47 AM Pager: [phone]

## 2017-01-14 NOTE — Evaluation (Signed)
Physical Therapy One time Evaluation Patient Details Name: Elijah Hippert Sr. MRN: [id number] DOB: 29-Nov-1934 Today's Date: 01/14/2017   History of Present Illness  Elijah Alban Sr. is a 82 y.o. male with medical history significant of prev PE and DVT last year was on eliquis for 6 months stopped about 2 months ago by [place]. PMH:  GERD, HTN. Pt admit after being treated for pna recently dc from [place] on abx and was just not feeling better.  Pt had cta which showed a significant PE.  Clinical Impression  Pt admitted with above diagnosis. Pt currently with functional limitations due to the deficits listed below (see PT Problem List). Pt was able to ambulate with overall good mobility.  Pt Independent with ambulation and able to perform up and down steps.  No skilled PT needs at this time as pt is independent and at baseline.  HAs cane and RW if needed and wife can assist as needed.  Will sign off as no further PT needs.  Follow Up Recommendations No PT follow up;Supervision/Assistance - 24 hour    Equipment Recommendations  None recommended by PT    Recommendations for Other Services       Precautions / Restrictions Precautions Precautions: None Restrictions Weight Bearing Restrictions: No      Mobility  Bed Mobility Overal bed mobility: Independent                Transfers Overall transfer level: Modified independent               General transfer comment: No difficulties with sit to stand  Ambulation/Gait Ambulation/Gait assistance: Supervision Ambulation Distance (Feet): 250 Feet Assistive device: None Gait Pattern/deviations: Step-through pattern;Decreased stride length   Gait velocity interpretation: <1.8 ft/sec, indicative of risk for recurrent falls General Gait Details: Pt without LOB with challenges.  Discussed that he could use cane or RW if needed if has a day where he is weaker.  Pt agrees.    Stairs Stairs: Yes Stairs assistance:  Supervision Stair Management: One rail Right;Alternating pattern;Forwards Number of Stairs: 10 General stair comments: Pt was able to ascend and descend steps without difficulty.   Wheelchair Mobility    Modified Rankin (Stroke Patients Only)       Balance Overall balance assessment: Needs assistance Sitting-balance support: No upper extremity supported;Feet supported Sitting balance-Leahy Scale: Normal Sitting balance - Comments: Pt able to bend forward and place shoes on without help    Standing balance support: No upper extremity supported;During functional activity Standing balance-Leahy Scale: Fair Standing balance comment: Can stand statically without UE support                 Standardized Balance Assessment Standardized Balance Assessment : Dynamic Gait Index   Dynamic Gait Index Level Surface: Normal Change in Gait Speed: Normal Gait with Horizontal Head Turns: Mild Impairment Gait with Vertical Head Turns: Normal Gait and Pivot Turn: Normal Step Over Obstacle: Normal Step Around Obstacles: Normal Steps: Mild Impairment Total Score: 22       Pertinent Vitals/Pain Pain Assessment: No/denies pain  VSS.  Home Living Family/patient expects to be discharged to:: Private residence Living Arrangements: Spouse/significant other Available Help at Discharge: Family;Available 24 hours/day Type of Home: House Home Access: Stairs to enter Entrance Stairs-Rails: Left;Right;Can reach both Entrance Stairs-Number of Steps: 3 Home Layout: One level Home Equipment: Walker - 2 wheels;Cane - single point;Crutches;Shower seat      Prior Function Level of Independence: Independent  Comments: retired per pt     Hand Dominance   Dominant Hand: Right    Extremity/Trunk Assessment   Upper Extremity Assessment Upper Extremity Assessment: Defer to OT evaluation    Lower Extremity Assessment Lower Extremity Assessment: Overall WFL for tasks assessed     Cervical / Trunk Assessment Cervical / Trunk Assessment: Normal  Communication   Communication: No difficulties  Cognition Arousal/Alertness: Awake/alert Behavior During Therapy: WFL for tasks assessed/performed Overall Cognitive Status: Within Functional Limits for tasks assessed                                        General Comments General comments (skin integrity, edema, etc.): 22/24 on DGI suggesting low risk of falls.     Exercises     Assessment/Plan    PT Assessment Patent does not need any further PT services  PT Problem List Decreased mobility;Decreased activity tolerance       PT Treatment Interventions Functional mobility training;Balance training    PT Goals (Current goals can be found in the Care Plan section)  Acute Rehab PT Goals Patient Stated Goal: to go  home today PT Goal Formulation: All assessment and education complete, DC therapy    Frequency     Barriers to discharge        Co-evaluation               AM-PAC PT "6 Clicks" Daily Activity  Outcome Measure Difficulty turning over in bed (including adjusting bedclothes, sheets and blankets)?: None Difficulty moving from lying on back to sitting on the side of the bed? : None Difficulty sitting down on and standing up from a chair with arms (e.g., wheelchair, bedside commode, etc,.)?: None Help needed moving to and from a bed to chair (including a wheelchair)?: None Help needed walking in hospital room?: None Help needed climbing 3-5 steps with a railing? : None 6 Click Score: 24    End of Session Equipment Utilized During Treatment: Gait belt Activity Tolerance: Patient tolerated treatment well Patient left: in bed;with call bell/phone within reach;with family/visitor present Nurse Communication: Mobility status PT Visit Diagnosis: Muscle weakness (generalized) (M62.81)    Time: 1601-0932 PT Time Calculation (min) (ACUTE ONLY): 11 min   Charges:   PT  Evaluation $PT Eval Low Complexity: 1 Low     PT G Codes:        Jude Linck,PT Acute Rehabilitation [phone] (pager)   Denice Paradise 01/14/2017, 9:18 AM

## 2017-01-19 ENCOUNTER — Ambulatory Visit: Payer: Medicare Other | Attending: Neurology

## 2017-01-19 DIAGNOSIS — I491 Atrial premature depolarization: Secondary | ICD-10-CM | POA: Insufficient documentation

## 2017-01-19 DIAGNOSIS — R5383 Other fatigue: Secondary | ICD-10-CM | POA: Diagnosis not present

## 2017-01-19 DIAGNOSIS — G4730 Sleep apnea, unspecified: Secondary | ICD-10-CM | POA: Diagnosis present

## 2017-01-19 DIAGNOSIS — I10 Essential (primary) hypertension: Secondary | ICD-10-CM | POA: Diagnosis not present

## 2017-01-19 DIAGNOSIS — R0683 Snoring: Secondary | ICD-10-CM | POA: Insufficient documentation

## 2017-01-19 DIAGNOSIS — G4733 Obstructive sleep apnea (adult) (pediatric): Secondary | ICD-10-CM | POA: Insufficient documentation

## 2017-01-25 ENCOUNTER — Encounter: Payer: Self-pay | Admitting: Family Medicine

## 2017-01-25 ENCOUNTER — Ambulatory Visit (INDEPENDENT_AMBULATORY_CARE_PROVIDER_SITE_OTHER): Payer: Medicare Other | Admitting: Family Medicine

## 2017-01-25 VITALS — BP 135/69 | HR 66 | Temp 97.5°F | Resp 16 | Ht 69.0 in | Wt 197.0 lb

## 2017-01-25 DIAGNOSIS — R51 Headache: Secondary | ICD-10-CM

## 2017-01-25 DIAGNOSIS — R519 Headache, unspecified: Secondary | ICD-10-CM

## 2017-01-25 DIAGNOSIS — G4733 Obstructive sleep apnea (adult) (pediatric): Secondary | ICD-10-CM | POA: Diagnosis not present

## 2017-01-25 DIAGNOSIS — I2699 Other pulmonary embolism without acute cor pulmonale: Secondary | ICD-10-CM | POA: Diagnosis not present

## 2017-01-25 NOTE — Assessment & Plan Note (Signed)
Uncertain exact etiology, suspect multifactorial could be related to MSK etiology with improved from chiropractor, also suggestive of possible bruxism or dental related now with dx mild OSA - Improves with Tylenol - Contraindicated NSAIDs ASA now on anticoagulation - No evidence of neurological changes no associated symptoms to suggest migraine  Plan: 1. Recommend may increase Tylenol dosing up to 1000mg  TID max, avoid rebound HA 2. Continue chiropractor as needed 3. Recommend follow-up with dentist about bruxism or grinding, may need mouthguard, also reviewed OSA 4. If not improved in future consider Head CT, symptoms started before anticoagulation unlikely for bleed and no significant red flag symptoms, reviewed with patient if any significant worsening now would review this

## 2017-01-25 NOTE — Progress Notes (Signed)
Subjective:    Patient ID: Elijah Ponder Sr., male    DOB: [birth year], 82 y.o.   MRN: [id number]  Elijah Petersen Sr. is a 82 y.o. male presenting on 01/25/2017 for Hospitalization Follow-up (pulmonary embolism )  Patient is accompanied by his wife, [place]  Hospital/Location: [facility] Date of Admission: 01/12/17 Date of Discharge: 01/14/17 Transitions of care telephone call: Not completed  Reason for Admission: Acute Pulmonary Emboli, DVT Primary (+Secondary) Diagnosis: Acute large R sided Pulmonary Embolus with bilateral DVT, concern possible pulmonary infarction  - Hospital H&P and Discharge Summary have been reviewed - Patient presents today 11 days after recent hospitalization. Brief summary of recent course, patient had symptoms of persistent to worsening breathing difficulty abdominal and chest pain and not improved after recent DC from [facility] on antibiotics for presumed pneumonia, I saw him in office 10/9, and due to worsening resp status and overall constellation of symptoms we agreed that he needed to return to hospital concern he was discharged too early, and agreed with patient decision to go to different location such as [facility] in [place]. 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 Symptoms of difficulty breathing and pain have dramatically improved and mostly resolved. - Both he and his wife express their frustration with  initial hospitalization at [facility] from 10/5 to 10/7 and express that the "diagnosis was missed" and that he did not have "any pneumonia" and that the problem they believe all along was the pulmonary embolus clot - New medications on discharge: Eliquis initial dose 5mg  x 2 for 10mg  BID then reduce to 5mg  BID after 1 week, he is still taking x 2 tabs twice daily, was not aware of dose change  --------------------------------------------------- Additional follow-up: He had recent PSG 01/19/17 at [facility], he does not know results, asking today, he is not interested in CPAP machine and declines next recommended CPAP Titration sleep study, he denies snoring, despite test showed 4% time snoring, and he denies bruxism or grinding, see below.  ----------------------------  NEW COMPLAINT TODAY  HEADACHE, left sided intermittent x 1 month Chronic problem for past 1 month. He has been going to chiropractor has helped relieve headache, with adjusting neck, temples, and behind ear. Thought may be related to teeth based on report of chiropractor. Last Dentist visit >1 year ago overdue, does not recall grinding teeth. Location: Left frontal and lateral Quality/Severity: Sharp severe pains up to 10/10 Duration of headache without treatment: lasting hours usually, longest up to 24 hours, yesterday no headache, back today Current Frequency: every other day, occasional wake up with headache at night Longest duration without headache: 1-2 days Accompanying symptoms: None - denies nausea, vomiting, photophobia, phonophobia, lacrimation, rhinorrhea. No activities trigger or change it and nothing seems to improve it Effective treatment: Tylenol Extra Strength 500mg  x 1 per dose initial onset symptoms with very good relief, but now starting to take longer for it to work. - Took Tylenol at 10am and  now headache improving - Takes Coffee 1 cup daily Ineffective treatment: None - cannot take NSAIDs or ASA due to Eliquis  anticoagulation. History of headaches: No prior history of headaches before 1 month ago History of imaging: None available Known triggers: Unknown  I have reviewed the discharge medication list, and have reconciled the current and discharge medications today.   Current Outpatient Prescriptions:  .  acetaminophen (TYLENOL) 325 MG tablet, Take 1 tablet (325 mg total) by mouth every 6 (six) hours as needed for mild pain (or Fever >/= 101)., Disp: , Rfl:  .  apixaban (ELIQUIS) 5 MG TABS tablet, Take eliquis 2 tabs (10mg ) twice a day 7 days.Then on 01/20/17, please take 1 tab twice a day, Disp: 60 tablet, Rfl: 5 .  atorvastatin (LIPITOR) 10 MG tablet, Take 10 mg by mouth daily., Disp: , Rfl:  .  feeding supplement, ENSURE ENLIVE, (ENSURE ENLIVE) LIQD, Take 237 mLs by mouth 2 (two) times daily between meals., Disp: 60 Bottle, Rfl: 0 .  latanoprost (XALATAN) 0.005 % ophthalmic solution, Place 1 drop into both eyes at bedtime., Disp: , Rfl:  .  lisinopril (PRINIVIL,ZESTRIL) 40 MG tablet, Take 0.5 tablets (20 mg total) by mouth daily., Disp: 30 tablet, Rfl: 0 .  omeprazole (PRILOSEC) 20 MG capsule, Take 2 capsules (40 mg total) by mouth 2 (two) times daily before a meal., Disp: 60 capsule, Rfl: 0 .  ondansetron (ZOFRAN ODT) 4 MG disintegrating tablet, Take 1 tablet (4 mg total) by mouth every 8 (eight) hours as needed for nausea or vomiting., Disp: 30 tablet, Rfl: 1 .  predniSONE (DELTASONE) 5 MG tablet, Take 5 mg by mouth daily., Disp: , Rfl:  .  senna-docusate (SENOKOT-S) 8.6-50 MG tablet, Take 1 tablet by mouth at bedtime as needed for mild constipation., Disp: , Rfl:   ------------------------------------------------------------------------- Social History  Substance Use Topics  . Smoking status: Former Smoker    Quit date: 04/06/1970  . Smokeless tobacco: Former Systems developer  . Alcohol use 0.0 oz/week     Comment: Consumes alcohol on occasion    Review of Systems Per HPI unless specifically  indicated above     Objective:    BP 135/69   Pulse 66   Temp (!) 97.5 F (36.4 C) (Oral)   Resp 16   Ht 5\' 9"  (1.753 m)   Wt 197 lb (89.4 kg)   SpO2 96%   BMI 29.09 kg/m   Wt Readings from Last 3 Encounters:  01/25/17 197 lb (89.4 kg)  01/12/17 191 lb 9.3 oz (86.9 kg)  01/12/17 197 lb (89.4 kg)    Physical Exam  Constitutional: He is oriented to person, place, and time. He appears well-developed and well-nourished. No distress.  Well-appearing much improved since last visit, comfortable, cooperative  HENT:  Head: Normocephalic and atraumatic.  Mouth/Throat: Oropharynx is clear and moist.  Frontal / maxillary sinuses non-tender. Nares patent without purulence or edema. Bilateral TMs clear without erythema, effusion or bulging. Oropharynx clear without erythema, exudates, edema or asymmetry. Has dental crowns, no obvious deformity or gum issue. TMJ bilateral with mild crepitus but no significant click pop or pain, and no asymmetric movement.  Eyes: Conjunctivae are normal. Right eye exhibits no discharge. Left eye exhibits no discharge.  Neck: Normal range of motion. Neck supple. No thyromegaly present.  Cardiovascular: Normal rate, regular rhythm, normal heart sounds and intact distal pulses.   No murmur heard. Pulmonary/Chest: Effort normal and breath sounds normal. No respiratory distress. He has no wheezes.  He has no rales.  Dramatically improved respiratory status since last visit, now breathing comfortably, able to take deeper breaths on inspiration, some residual coarse or reduced sounds at bases  Musculoskeletal: Normal range of motion. He exhibits no edema.  Upper / Lower Extremities: - Normal muscle tone, strength bilateral upper extremities 5/5, lower extremities 5/5  - Normal Gait  Lymphadenopathy:    He has no cervical adenopathy.  Neurological: He is alert and oriented to person, place, and time. No cranial nerve deficit.  Distal sensation to light touch intact  upper and lower extremities  Skin: Skin is warm and dry. No rash (head or scalp or neck) noted. He is not diaphoretic. No erythema.  Psychiatric: He has a normal mood and affect. His behavior is normal.  Well groomed, good eye contact, normal speech and thoughts  Nursing note and vitals reviewed.    Results for orders placed or performed during the hospital encounter of 01/12/17  MRSA PCR Screening  Result Value Ref Range   MRSA by PCR NEGATIVE NEGATIVE  Lipase, blood  Result Value Ref Range   Lipase 28 11 - 51 U/L  Comprehensive metabolic panel  Result Value Ref Range   Sodium 137 135 - 145 mmol/L   Potassium 4.1 3.5 - 5.1 mmol/L   Chloride 106 101 - 111 mmol/L   CO2 18 (L) 22 - 32 mmol/L   Glucose, Bld 120 (H) 65 - 99 mg/dL   BUN 20 6 - 20 mg/dL   Creatinine, Ser 1.18 0.61 - 1.24 mg/dL   Calcium 8.8 (L) 8.9 - 10.3 mg/dL   Total Protein 6.8 6.5 - 8.1 g/dL   Albumin 3.2 (L) 3.5 - 5.0 g/dL   AST 86 (H) 15 - 41 U/L   ALT 116 (H) 17 - 63 U/L   Alkaline Phosphatase 137 (H) 38 - 126 U/L   Total Bilirubin 1.2 0.3 - 1.2 mg/dL   GFR calc non Af Amer 56 (L) >60 mL/min   GFR calc Af Amer >60 >60 mL/min   Anion gap 13 5 - 15  CBC  Result Value Ref Range   WBC 8.8 4.0 - 10.5 K/uL   RBC 4.34 4.22 - 5.81 MIL/uL   Hemoglobin 13.7 13.0 - 17.0 g/dL   HCT 40.1 39.0 - 52.0 %   MCV 92.4 78.0 - 100.0 fL   MCH 31.6 26.0 - 34.0 pg   MCHC 34.2 30.0 - 36.0 g/dL   RDW 13.3 11.5 - 15.5 %   Platelets 238 150 - 400 K/uL  Urinalysis, Routine w reflex microscopic  Result Value Ref Range   Color, Urine YELLOW YELLOW   APPearance HAZY (A) CLEAR   Specific Gravity, Urine 1.025 1.005 - 1.030   pH 5.0 5.0 - 8.0   Glucose, UA NEGATIVE NEGATIVE mg/dL   Hgb urine dipstick SMALL (A) NEGATIVE   Bilirubin Urine NEGATIVE NEGATIVE   Ketones, ur 20 (A) NEGATIVE mg/dL   Protein, ur NEGATIVE NEGATIVE mg/dL   Nitrite NEGATIVE NEGATIVE   Leukocytes, UA NEGATIVE NEGATIVE   RBC / HPF 0-5 0 - 5 RBC/hpf   WBC,  UA 0-5 0 - 5 WBC/hpf   Bacteria, UA NONE SEEN NONE SEEN   Squamous Epithelial / LPF NONE SEEN NONE SEEN   Mucus PRESENT   Heparin level (unfractionated)  Result Value Ref Range   Heparin Unfractionated 0.28 (L) 0.30 - 0.70 IU/mL  CBC  Result Value Ref Range   WBC 6.5 4.0 - 10.5 K/uL  RBC 3.99 (L) 4.22 - 5.81 MIL/uL   Hemoglobin 12.5 (L) 13.0 - 17.0 g/dL   HCT 37.0 (L) 39.0 - 52.0 %   MCV 92.7 78.0 - 100.0 fL   MCH 31.3 26.0 - 34.0 pg   MCHC 33.8 30.0 - 36.0 g/dL   RDW 13.2 11.5 - 15.5 %   Platelets 223 150 - 400 K/uL  Troponin I  Result Value Ref Range   Troponin I 0.06 (HH) <0.03 ng/mL  Troponin I  Result Value Ref Range   Troponin I 0.05 (HH) <0.03 ng/mL  Troponin I  Result Value Ref Range   Troponin I 0.04 (HH) <0.03 ng/mL  Basic metabolic panel  Result Value Ref Range   Sodium 137 135 - 145 mmol/L   Potassium 4.2 3.5 - 5.1 mmol/L   Chloride 104 101 - 111 mmol/L   CO2 20 (L) 22 - 32 mmol/L   Glucose, Bld 86 65 - 99 mg/dL   BUN 21 (H) 6 - 20 mg/dL   Creatinine, Ser 1.19 0.61 - 1.24 mg/dL   Calcium 8.5 (L) 8.9 - 10.3 mg/dL   GFR calc non Af Amer 56 (L) >60 mL/min   GFR calc Af Amer >60 >60 mL/min   Anion gap 13 5 - 15  CBC  Result Value Ref Range   WBC 7.1 4.0 - 10.5 K/uL   RBC 3.82 (L) 4.22 - 5.81 MIL/uL   Hemoglobin 11.9 (L) 13.0 - 17.0 g/dL   HCT 35.2 (L) 39.0 - 52.0 %   MCV 92.1 78.0 - 100.0 fL   MCH 31.2 26.0 - 34.0 pg   MCHC 33.8 30.0 - 36.0 g/dL   RDW 13.2 11.5 - 15.5 %   Platelets 300 150 - 400 K/uL  I-Stat CG4 Lactic Acid, ED  Result Value Ref Range   Lactic Acid, Venous 1.87 0.5 - 1.9 mmol/L  I-stat troponin, ED  Result Value Ref Range   Troponin i, poc 0.02 0.00 - 0.08 ng/mL   Comment 3          ECHOCARDIOGRAM COMPLETE  Result Value Ref Range   Weight 3,065.28 oz   Height 69 in   BP 131/67 mmHg   LV PW d 12 (A) 0.6 - 1.1 mm   FS 28 28 - 44 %   LA vol 59.1 mL   Ao-asc 35 cm   LA ID, A-P, ES 46 mm   IVS/LV PW RATIO, ED 1.17    LV e'  LATERAL 5.55 cm/s   LV E/e' medial 14.32    LV E/e'average 14.32    LA diam index 2.22 cm/m2   LA vol A4C 70.8 ml   Area-P 1/2 1.68 cm2   E decel time 250 msec   LVOT diameter 20 mm   LVOT area 3.14 cm2   Peak grad 3 mmHg   E/e' ratio 14.32    MV pk E vel 79.5 m/s   P 1/2 time 73 ms   MV pk A vel 88.8 m/s   LA vol index 28.5 mL/m2   MV Dec 250    LA diam end sys 46.00 mm   TDI e' medial 5.11    TDI e' lateral 5.55    Lateral S' vel 21.10 cm/sec   TAPSE 24.40 mm      Assessment & Plan:   Problem List Items Addressed This Visit    Left-sided headache    Uncertain exact etiology, suspect multifactorial could be related to MSK etiology with  improved from chiropractor, also suggestive of possible bruxism or dental related now with dx mild OSA - Improves with Tylenol - Contraindicated NSAIDs ASA now on anticoagulation - No evidence of neurological changes no associated symptoms to suggest migraine  Plan: 1. Recommend may increase Tylenol dosing up to 1000mg  TID max, avoid rebound HA 2. Continue chiropractor as needed 3. Recommend follow-up with dentist about bruxism or grinding, may need mouthguard, also reviewed OSA 4. If not improved in future consider Head CT, symptoms started before anticoagulation unlikely for bleed and no significant red flag symptoms, reviewed with patient if any significant worsening now would review this      OSA (obstructive sleep apnea)    Confirmed new dx Mild OSA recently with initial PSG Reviewed report from [facility]  Plan: 1. Recommended next step CPAP Titration study, patient adamantly declines this does not want to wear CPAP mask or use machine. He does not think he has this problem. He disagrees with result 2. Follow-up if changes mind, again recommended pursuing further testing as this was likely factor for his weakness and sleepiness, and may be related to headache / bruxism      Pulmonary emboli (HCC) - Primary    Recurrent episode  large R sided PE, with bilateral DVT, now recurrent PE, previously on anticoag Eliquis taken off 11[facility] - No evidence heart strain or complication per work-up in hospital ECHO - No fibrinolysis performed  Plan: 1. Agree with lifelong anticoagulation Eliquis - advised patient to reduce dose as prescribed already down to 5mg  BID instead of 10mg  BID (already completed 1 week) - I asked him to consider review with Rafael Bihari and other physicians at [facility] to determine if potentially can reduce dose to maintenance dose 2.5mg  in future for more prevention or if requires full therapeutic anticoagulation dose 5mg  BID lifelong, which I suspect will be the answer, for now I advised him to continue Eliquis 5mg  BID for at least 12 months then re-consider dose adjust vs continue indefinitely 2. No evidence of symptoms of DVTs for now - continue eliquis 3. Follow-up [facility] weeks, and [facility]           No orders of the defined types were placed in this encounter.   Follow up plan: Return in about 6 weeks (around 03/08/2017), or if symptoms worsen or fail to improve, for headache.  [facility] - [facility], will do labs  [facility]) 02/11/17, hospital follow-up PE  Nobie Putnam, [facility] 01/25/2017, 12:58 PM

## 2017-01-25 NOTE — Assessment & Plan Note (Signed)
Confirmed new dx Mild OSA recently with initial PSG Reviewed report from [facility]  Plan: 1. Recommended next step CPAP Titration study, patient adamantly declines this does not want to wear CPAP mask or use machine. He does not think he has this problem. He disagrees with result 2. Follow-up if changes mind, again recommended pursuing further testing as this was likely factor for his weakness and sleepiness, and may be related to headache / bruxism

## 2017-01-25 NOTE — Assessment & Plan Note (Signed)
Recurrent episode large R sided PE, with bilateral DVT, now recurrent PE, previously on anticoag Eliquis taken off 11/2016 [facility] - No evidence heart strain or complication per work-up in hospital ECHO - No fibrinolysis performed  Plan: 1. Agree with lifelong anticoagulation Eliquis - advised patient to reduce dose as prescribed already down to 5mg  BID instead of 10mg  BID (already completed 1 week) - I asked him to consider review with Rafael Bihari and other physicians at [facility] to determine if potentially can reduce dose to maintenance dose 2.5mg  in future for more prevention or if requires full therapeutic anticoagulation dose 5mg  BID lifelong, which I suspect will be the answer, for now I advised him to continue Eliquis 5mg  BID for at least 12 months then re-consider dose adjust vs continue indefinitely 2. No evidence of symptoms of DVTs for now - continue eliquis 3. Follow-up [facility] in [facility] weeks, and [facility]

## 2017-01-25 NOTE — Patient Instructions (Addendum)
Thank you for coming to the clinic today.  1. Reduce dose Eliquis from 5mg  TWO tablets twice daily down to ONE tablet TWICE daily for 5mg  per dose - You have plenty of refills - Continue at current dose for up to 12 months, then consider reduce dose to 2.5mg  twice daily in future after 1 year if deemed necessary  2. Increase Tylenol dose from 500mg  to 1000mg  per dose. Try not to take every day, can get rebound headache on tylenol.  If need can increase to 1000mg  3 times daily for a short course few days is fine.  You do have sleep apnea, see sleep study report. I recommend repeat study for CPAP titration trial, they have other devices other than full face mask but consider this. Also can have teeth grinding with sleep apnea, or may benefit from a mouth guard. Check with Dentist next for teeth related to headache or mouthguard  Avoid anti-inflammatory and aspirin as discussed while on Eliquis  If dramatic change of headache or worsening new symptoms, numbness, tingling, weakness, vision changes, severe headache not improving on tylenol, notify office or seek more immediate care, can consider Head CT scan.  Please schedule a Follow-up Appointment to: Return in about 6 weeks (around 03/08/2017), or if symptoms worsen or fail to improve, for headache.  If you have any other questions or concerns, please feel free to call the clinic or send a message through [employer]. You may also schedule an earlier appointment if necessary.  Additionally, you may be receiving a survey about your experience at our clinic within a few days to 1 week by e-mail or mail. We value your feedback.  Nobie Putnam, DO [place]

## 2017-02-11 ENCOUNTER — Ambulatory Visit (INDEPENDENT_AMBULATORY_CARE_PROVIDER_SITE_OTHER): Payer: Medicare Other | Admitting: Pulmonary Disease

## 2017-02-11 ENCOUNTER — Encounter: Payer: Self-pay | Admitting: Pulmonary Disease

## 2017-02-11 VITALS — BP 122/68 | HR 71 | Ht 69.0 in | Wt 194.2 lb

## 2017-02-11 DIAGNOSIS — G4733 Obstructive sleep apnea (adult) (pediatric): Secondary | ICD-10-CM

## 2017-02-11 DIAGNOSIS — J984 Other disorders of lung: Secondary | ICD-10-CM | POA: Diagnosis not present

## 2017-02-11 DIAGNOSIS — I2699 Other pulmonary embolism without acute cor pulmonale: Secondary | ICD-10-CM

## 2017-02-11 NOTE — Patient Instructions (Signed)
Continue the anticoagulation with Eliquis.  You will need to be on lifelong anticoagulation Follow-up in 6 months.

## 2017-02-11 NOTE — Progress Notes (Signed)
Elijah Graser Sr.    [id number]    Mar 06, 1935  Primary Care Physician:Karamalegos, Devonne Doughty, DO  Referring Physician: Olin Hauser, DO [address] [place], [place] [address]  Chief complaint:  Follow-up for  Reccurent PE, DVT Cavitary lung lesions > resolved  HPI: 82 year old with past medical history of hypertension, hyperlipidemia, GERD, prostate cancer. He was admitted in December 2017 for acute gastroenteritis, diarrhea. After discharge he was immobile at home for a couple of weeks due to weakness. He then had a readmission in early Jdxnuary 2018 with dyspnea, hypoxia. He had a CT which showed bilateral PE and a cavitary lesions in bilateral lungs. He was started on anticoagulation and given antibiotics for a total of 2 weeks.   Interim History:  He was taken off anticoagulation by the [facility] on August 2018.  He was hospitalized at [facility] in October 2018 with bilateral lower lobe pneumonia.  He did not improve post discharge and had a reevaluation at [facility] which showed recurrence of acute pulmonary embolism and DVT.  There is no evidence of RV strain on echocardiogram.  He has been placed back on Eliquis anticoagulation.  He has had a sleep study done in [place] which showed mild sleep apnea.  This was discussed at his primary care and patient declines CPAP use.   Outpatient Encounter Medications as of 02/11/2017  Medication Sig  . acetaminophen (TYLENOL) 325 MG tablet Take 1 tablet (325 mg total) by mouth every 6 (six) hours as needed for mild pain (or Fever >/= 101).  Marland Kitchen apixaban (ELIQUIS) 5 MG TABS tablet Take eliquis 2 tabs (10mg ) twice a day 7 days.Then on 01/20/17, please take 1 tab twice a day  . atorvastatin (LIPITOR) 10 MG tablet Take 10 mg by mouth daily.  . feeding supplement, ENSURE ENLIVE, (ENSURE ENLIVE) LIQD Take 237 mLs by mouth 2 (two) times daily between meals.  . latanoprost (XALATAN) 0.005 % ophthalmic solution Place 1 drop into both eyes  at bedtime.  Marland Kitchen lisinopril (PRINIVIL,ZESTRIL) 40 MG tablet Take 0.5 tablets (20 mg total) by mouth daily.  Marland Kitchen omeprazole (PRILOSEC) 20 MG capsule Take 2 capsules (40 mg total) by mouth 2 (two) times daily before a meal.  . ondansetron (ZOFRAN ODT) 4 MG disintegrating tablet Take 1 tablet (4 mg total) by mouth every 8 (eight) hours as needed for nausea or vomiting.  . predniSONE (DELTASONE) 5 MG tablet Take 5 mg by mouth daily.  Marland Kitchen senna-docusate (SENOKOT-S) 8.6-50 MG tablet Take 1 tablet by mouth at bedtime as needed for mild constipation.   No facility-administered encounter medications on file as of 02/11/2017.     Allergies as of 02/11/2017  . (No Known Allergies)    Past Medical History:  Diagnosis Date  . Dyslipidemia   . GERD (gastroesophageal reflux disease)   . Glaucoma   . Hyperlipidemia   . Hypertension   . Insomnia   . Myasthenia gravis ([place]) 09/08/2012  . Obesity   . Ocular myasthenia gravis ([place])   . Prostate cancer [facility])     Past Surgical History:  Procedure Laterality Date  . CATARACT EXTRACTION Bilateral   . CHOLECYSTECTOMY    . TRANSURETHRAL RESECTION OF PROSTATE      Family History  Problem Relation Age of Onset  . Heart attack Father     Social History   Socioeconomic History  . Marital status: Married    Spouse name: Crystal  . Number of children:  3  . Years of education: 68  . Highest education level: Not on file  Social Needs  . Financial resource strain: Not on file  . Food insecurity - worry: Not on file  . Food insecurity - inability: Not on file  . Transportation needs - medical: Not on file  . Transportation needs - non-medical: Not on file  Occupational History    Comment: retired  Tobacco Use  . Smoking status: Former Smoker    Last attempt to quit: 04/06/1970    Years since quitting: 46.8  . Smokeless tobacco: Former Network engineer and Sexual Activity  . Alcohol use: Yes    Alcohol/week: 0.0 oz    Comment: Consumes alcohol on  occasion  . Drug use: No  . Sexual activity: Not on file  Other Topics Concern  . Not on file  Social History Narrative   Patient lives at home with his wife Veterinary surgeon)   Retired - [employer]   [place]   Right handed.   Caffeine- four cups daily.             Review of systems: Review of Systems  Constitutional: Negative for fever and chills.  HENT: Negative.   Eyes: Negative for blurred vision.  Respiratory: as per HPI  Cardiovascular: Negative for chest pain and palpitations.  Gastrointestinal: Negative for vomiting, diarrhea, blood per rectum. Genitourinary: Negative for dysuria, urgency, frequency and hematuria.  Musculoskeletal: Negative for myalgias, back pain and joint pain.  Skin: Negative for itching and rash.  Neurological: Negative for dizziness, tremors, focal weakness, seizures and loss of consciousness.  Endo/Heme/Allergies: Negative for environmental allergies.  Psychiatric/Behavioral: Negative for depression, suicidal ideas and hallucinations.  All other systems reviewed and are negative.  Physical Exam: Blood pressure 140/62, pulse 71, height 5\' 9"  (1.753 m), weight 91 kg (200 lb 9.6 oz), SpO2 100 %. Gen:      No acute distress HEENT:  EOMI, sclera anicteric Neck:     No masses; no thyromegaly Lungs:    Clear to auscultation bilaterally; normal respiratory effort CV:         Regular rate and rhythm; no murmurs Abd:      + bowel sounds; soft, non-tender; no palpable masses, no distension Ext:    No edema; adequate peripheral perfusion Skin:      Warm and dry; no rash Neuro: alert and oriented x 3 Psych: normal mood and affect  Data Reviewed: CT scan 04/15/16- bilateral PE, saddle embolus in the right PA. Cavitation in the left upper lobe, rt upper lobe consolidation CT scan 05/01/16- decreased size of left upper lobe cavitary lesion. decrease in size of right upper lobe opacity CT scan 07/30/16- near-complete resolution of right upper lobe opacity and  left upper lobe cavitary lesion with small residual scarring, CTA 01/12/17-right-sided pulmonary embolism, right lower lobe infiltrate which may represent infarction Reviewed all images personally.  Echo 04/16/16-EF 65-70 percent, normal LV size, normal PA pressure Echo 01/13/17- EF 65-70%, mild LVH, grade 1 diastolic dysfunction,  Lower extremity Doppler 04/16/16-bilateral lower extremity DVT  Lower extremity Dopplers 10/19/16-  Chronic, non occlusive, thrombus in the right deep femoral and popliteal veins.  Chronic, non occlusive, thrombus in the left distal common femoral vein extending into the ostium and proximal femoral vein.  Chronic, occlusive thrombus in the left mid femoral vein.  Chronic, non occlusive, thrombus in the left distal femoral vein and popliteal vein. Incompetent right peroneal vein. Incompetent left proximal femoral and popliteal veins.  Lower  extremity Doppler 01/13/17- acute DVT on the right and left, chronic DVT throughout left lower extremity.  Normal RV function.    Assessment:  Recurrent PE, DVT Discussed with the patient.  He is currently on Eliquis and will require lifelong anticoagulant agent. Will continue full dose anticoagulation for now. We discussed evaluation for hypercoagulable state but he declined.  He will follow-up at the [place] as well.  Cavitary lung lesions Resolved on subsequent CT scan.  Sleep apnea He has already discussed this with his primary care has declined CPAP therapy.  Plan/Recommendations: - Continue anticoagulation for PE - Follow up at the [place]  Return in 6 months.  Marshell Garfinkel MD [facility] Pager [phone] 02/11/2017, 11:08 AM  CC: Nobie Putnam *

## 2017-02-24 ENCOUNTER — Telehealth: Payer: Self-pay | Admitting: Family Medicine

## 2017-02-24 DIAGNOSIS — I2699 Other pulmonary embolism without acute cor pulmonale: Secondary | ICD-10-CM

## 2017-02-24 MED ORDER — APIXABAN 5 MG PO TABS: 5.0000 mg | ORAL_TABLET | Freq: Two times a day (BID) | ORAL | 0 refills | Status: DC

## 2017-02-24 NOTE — Telephone Encounter (Signed)
I have signed refill of Eliquis 5mg  BID for #60 pills 0 refills, 1 month supply sent to [facility]  Please notify patient that this rx has been sent, and clarify with them if they plan to continue to receive it from the [place] in future. Or if need more refills.  Nobie Putnam, [facility] 02/24/2017, 1:01 PM

## 2017-02-24 NOTE — Telephone Encounter (Signed)
Pt was notified of the prescription sent to his local pharmacy. He informed me that in the future he will get them from the [place] so no refills are necessary.

## 2017-02-24 NOTE — Telephone Encounter (Signed)
[facility] not [facility] for [facility].  Crystal asked if he could get a prescription for a few days until medication is received [phone]

## 2017-02-24 NOTE — Telephone Encounter (Signed)
Pt called requesting a refill on Eliquis  [address]  Pt. Call back # is [phone]

## 2017-03-02 ENCOUNTER — Ambulatory Visit: Payer: Medicare Other | Admitting: Family Medicine

## 2017-08-06 ENCOUNTER — Ambulatory Visit (INDEPENDENT_AMBULATORY_CARE_PROVIDER_SITE_OTHER): Payer: Medicare Other | Admitting: Family Medicine

## 2017-08-06 ENCOUNTER — Encounter: Payer: Self-pay | Admitting: Family Medicine

## 2017-08-06 VITALS — BP 139/67 | HR 51 | Temp 97.8°F | Resp 16 | Ht 69.0 in | Wt 200.8 lb

## 2017-08-06 DIAGNOSIS — R197 Diarrhea, unspecified: Secondary | ICD-10-CM

## 2017-08-06 NOTE — Assessment & Plan Note (Addendum)
Resolved. Chronic recurrent watery diarrhea over nearly >1 year but has only discrete brief episodes. Possible prior viral gastro. Consider also may be s/p cholecystectomy. No recent triggers for current flare. No antibiotics. Seems resolved after dietary change stopped eating red meat No known sick contacts, no daycare contact - Currently well appearing and non-toxic, clinically well hydrated on exam, benign abdomen H/o 03/2016-04/2016 hospitalization, C diff was negative - Additional differential consider encopresis liquid stools around possible deeper area of constipation as possibility, may benefit from imaging KUB next time if symptomatic  Plan: 1. Reassurance, since resolved - Prevention - increase fiber in diet, supplement, bulking - Remain hydrated - Consider Imodium PRN 2. Defer stool testing culture / C Diff / o&p - given no active diarrhea 3. Return criteria reviewed - if recurrent flare will check stool studies - otherwise consider future GI ref for colonoscopy (last done many years ago) also consider digestive enzymes if needed

## 2017-08-06 NOTE — Patient Instructions (Addendum)
Thank you for coming to the office today.  Reassurance today  May try OTC Probiotic to improve GI function and get back to baseline it may take several weeks as well  May consider daily fiber supplement  - Increase hydration with water - Increase fiber in diet (high fiber foods = vegetables, leafy greens, oats/grains) - May take OTC Fiber supplement (metamucil powder or pill/gummy)  If flare up again similar symptoms can return and we can check stool samples and maybe x-ray abdomen for possible deeper constipation  Otherwise if need more answers can request referral 2nd opinion to GI   Please schedule a Follow-up Appointment to: Return if symptoms worsen or fail to improve, for diarrhea.  If you have any other questions or concerns, please feel free to call the office or send a message through [place]. You may also schedule an earlier appointment if necessary.  Additionally, you may be receiving a survey about your experience at our office within a few days to 1 week by e-mail or mail. We value your feedback.  Nobie Putnam, DO [place]

## 2017-08-06 NOTE — Progress Notes (Signed)
Subjective:    Patient ID: Elijah Ponder Sr., male    DOB: Jan 05, 1935, 83 y.o.   MRN: [id number]  Elijah Leja Sr. is a 83 y.o. male presenting on 08/06/2017 for Diarrhea (as per patient ongoing diarrhea but not from past 2 weeks stop eating red meat)  Patient provides majority of history, he is accompanied by his wife, Crystal who provides additional history.  HPI   FOLLOW-UP DIARRHEA, watery, recurrent episodes - Last visit for this topic 12/2016 and 01/2017, treated with reassurance, rehydration, OTC imodium, Zofran PRN, and considered stool culture / testing but deferred due to acuity, see prior notes for background information. - Interval update with his symptoms eventually resolved, ultimately thought to be possible viral gastro etiology - Today patient reports recurrence of same problem about 4 weeks ago, similar to before, without warning, watery diarrhea again, no other associated symptoms, he later stopped eating red meat and then it resolved promptly 2 weeks ago. - He is currently symptom free now, but scheduled to review this problem and figure out what was causing it. - No recent sick contacts. No recent antibiotics. No new exposures (outdoor, environmental, unusual foods, daycare or young children). - He has significant history of prior diarrheal illness following acute pulmonary illness and pneumonia and lung mass infection in 03/2016, he had persistent diarrhea, dx with gastroenteritis, C Diff testing at that time was negative. No prior history of C Diff. He has had some intermittent episodes of diarrhea over past 9 months, but nothing sustained, now concern with new symptoms - Tolerating PO - He attributes his diarrhea episodes are triggered by his history of cholecystectomy  - Denies any active diarrhea, nausea vomiting, blood in stool, dark stool, fever, chills sweats, abdominal pain, bloating, dyspnea, cough, URI symptoms  Past Surgical History:  Procedure Laterality Date    . CATARACT EXTRACTION Bilateral   . CHOLECYSTECTOMY    . TRANSURETHRAL RESECTION OF PROSTATE       Depression screen [facility] 2/9 09/07/2016  Decreased Interest 0  Down, Depressed, Hopeless 0  PHQ - 2 Score 0    Social History   Tobacco Use  . Smoking status: Former Smoker    Last attempt to quit: 04/06/1970    Years since quitting: 47.3  . Smokeless tobacco: Former Network engineer Use Topics  . Alcohol use: Yes    Alcohol/week: 0.0 oz    Comment: Consumes alcohol on occasion  . Drug use: No    Review of Systems Per HPI unless specifically indicated above     Objective:    BP 139/67   Pulse (!) 51   Temp 97.8 F (36.6 C) (Oral)   Resp 16   Ht 5\' 9"  (1.753 m)   Wt 200 lb 12.8 oz (91.1 kg)   BMI 29.65 kg/m   Wt Readings from Last 3 Encounters:  08/06/17 200 lb 12.8 oz (91.1 kg)  02/11/17 194 lb 4 oz (88.1 kg)  01/25/17 197 lb (89.4 kg)    Physical Exam  Constitutional: He is oriented to person, place, and time. He appears well-developed and well-nourished. No distress.  Well-appearing, comfortable, cooperative  HENT:  Head: Normocephalic and atraumatic.  Mouth/Throat: Oropharynx is clear and moist.  Eyes: Conjunctivae are normal. Right eye exhibits no discharge. Left eye exhibits no discharge.  Cardiovascular: Normal rate.  Pulmonary/Chest: Effort normal.  Abdominal: Soft. Bowel sounds are normal. He exhibits no distension and no mass. There is no tenderness. There is no rebound and no guarding.  Musculoskeletal: He exhibits no edema.  Neurological: He is alert and oriented to person, place, and time.  Skin: Skin is warm and dry. No rash noted. He is not diaphoretic. No erythema.  Psychiatric: He has a normal mood and affect. His behavior is normal.  Well groomed, good eye contact, normal speech and thoughts  Nursing note and vitals reviewed.  Results for orders placed or performed during the hospital encounter of 01/12/17  MRSA PCR Screening  Result Value Ref  Range   MRSA by PCR NEGATIVE NEGATIVE  Lipase, blood  Result Value Ref Range   Lipase 28 11 - 51 U/L  Comprehensive metabolic panel  Result Value Ref Range   Sodium 137 135 - 145 mmol/L   Potassium 4.1 3.5 - 5.1 mmol/L   Chloride 106 101 - 111 mmol/L   CO2 18 (L) 22 - 32 mmol/L   Glucose, Bld 120 (H) 65 - 99 mg/dL   BUN 20 6 - 20 mg/dL   Creatinine, Ser 1.18 0.61 - 1.24 mg/dL   Calcium 8.8 (L) 8.9 - 10.3 mg/dL   Total Protein 6.8 6.5 - 8.1 g/dL   Albumin 3.2 (L) 3.5 - 5.0 g/dL   AST 86 (H) 15 - 41 U/L   ALT 116 (H) 17 - 63 U/L   Alkaline Phosphatase 137 (H) 38 - 126 U/L   Total Bilirubin 1.2 0.3 - 1.2 mg/dL   GFR calc non Af Amer 56 (L) >60 mL/min   GFR calc Af Amer >60 >60 mL/min   Anion gap 13 5 - 15  CBC  Result Value Ref Range   WBC 8.8 4.0 - 10.5 K/uL   RBC 4.34 4.22 - 5.81 MIL/uL   Hemoglobin 13.7 13.0 - 17.0 g/dL   HCT 40.1 39.0 - 52.0 %   MCV 92.4 78.0 - 100.0 fL   MCH 31.6 26.0 - 34.0 pg   MCHC 34.2 30.0 - 36.0 g/dL   RDW 13.3 11.5 - 15.5 %   Platelets 238 150 - 400 K/uL  Urinalysis, Routine w reflex microscopic  Result Value Ref Range   Color, Urine YELLOW YELLOW   APPearance HAZY (A) CLEAR   Specific Gravity, Urine 1.025 1.005 - 1.030   pH 5.0 5.0 - 8.0   Glucose, UA NEGATIVE NEGATIVE mg/dL   Hgb urine dipstick SMALL (A) NEGATIVE   Bilirubin Urine NEGATIVE NEGATIVE   Ketones, ur 20 (A) NEGATIVE mg/dL   Protein, ur NEGATIVE NEGATIVE mg/dL   Nitrite NEGATIVE NEGATIVE   Leukocytes, UA NEGATIVE NEGATIVE   RBC / HPF 0-5 0 - 5 RBC/hpf   WBC, UA 0-5 0 - 5 WBC/hpf   Bacteria, UA NONE SEEN NONE SEEN   Squamous Epithelial / LPF NONE SEEN NONE SEEN   Mucus PRESENT   Heparin level (unfractionated)  Result Value Ref Range   Heparin Unfractionated 0.28 (L) 0.30 - 0.70 IU/mL  CBC  Result Value Ref Range   WBC 6.5 4.0 - 10.5 K/uL   RBC 3.99 (L) 4.22 - 5.81 MIL/uL   Hemoglobin 12.5 (L) 13.0 - 17.0 g/dL   HCT 37.0 (L) 39.0 - 52.0 %   MCV 92.7 78.0 - 100.0 fL    MCH 31.3 26.0 - 34.0 pg   MCHC 33.8 30.0 - 36.0 g/dL   RDW 13.2 11.5 - 15.5 %   Platelets 223 150 - 400 K/uL  Troponin I  Result Value Ref Range   Troponin I 0.06 (HH) <0.03 ng/mL  Troponin I  Result Value  Ref Range   Troponin I 0.05 (HH) <0.03 ng/mL  Troponin I  Result Value Ref Range   Troponin I 0.04 (HH) <0.03 ng/mL  Basic metabolic panel  Result Value Ref Range   Sodium 137 135 - 145 mmol/L   Potassium 4.2 3.5 - 5.1 mmol/L   Chloride 104 101 - 111 mmol/L   CO2 20 (L) 22 - 32 mmol/L   Glucose, Bld 86 65 - 99 mg/dL   BUN 21 (H) 6 - 20 mg/dL   Creatinine, Ser 1.19 0.61 - 1.24 mg/dL   Calcium 8.5 (L) 8.9 - 10.3 mg/dL   GFR calc non Af Amer 56 (L) >60 mL/min   GFR calc Af Amer >60 >60 mL/min   Anion gap 13 5 - 15  CBC  Result Value Ref Range   WBC 7.1 4.0 - 10.5 K/uL   RBC 3.82 (L) 4.22 - 5.81 MIL/uL   Hemoglobin 11.9 (L) 13.0 - 17.0 g/dL   HCT 35.2 (L) 39.0 - 52.0 %   MCV 92.1 78.0 - 100.0 fL   MCH 31.2 26.0 - 34.0 pg   MCHC 33.8 30.0 - 36.0 g/dL   RDW 13.2 11.5 - 15.5 %   Platelets 300 150 - 400 K/uL  I-Stat CG4 Lactic Acid, ED  Result Value Ref Range   Lactic Acid, Venous 1.87 0.5 - 1.9 mmol/L  I-stat troponin, ED  Result Value Ref Range   Troponin i, poc 0.02 0.00 - 0.08 ng/mL   Comment 3          ECHOCARDIOGRAM COMPLETE  Result Value Ref Range   Weight 3,065.28 oz   Height 69 in   BP 131/67 mmHg   LV PW d 12 (A) 0.6 - 1.1 mm   FS 28 28 - 44 %   LA vol 59.1 mL   Ao-asc 35 cm   LA ID, A-P, ES 46 mm   IVS/LV PW RATIO, ED 1.17    LV e' LATERAL 5.55 cm/s   LV E/e' medial 14.32    LV E/e'average 14.32    LA diam index 2.22 cm/m2   LA vol A4C 70.8 ml   Area-P 1/2 1.68 cm2   E decel time 250 msec   LVOT diameter 20 mm   LVOT area 3.14 cm2   Peak grad 3 mmHg   E/e' ratio 14.32    MV pk E vel 79.5 m/s   P 1/2 time 73 ms   MV pk A vel 88.8 m/s   LA vol index 28.5 mL/m2   MV Dec 250    LA diam end sys 46.00 mm   TDI e' medial 5.11    TDI e' lateral  5.55    Lateral S' vel 21.10 cm/sec   TAPSE 24.40 mm      Assessment & Plan:   Problem List Items Addressed This Visit    Diarrhea - Primary    Resolved. Chronic recurrent watery diarrhea over nearly >1 year but has only discrete brief episodes. Possible prior viral gastro. Consider also may be s/p cholecystectomy. No recent triggers for current flare. No antibiotics. Seems resolved after dietary change stopped eating red meat No known sick contacts, no daycare contact - Currently well appearing and non-toxic, clinically well hydrated on exam, benign abdomen H/o 03/2016-04/2016 hospitalization, C diff was negative - Additional differential consider encopresis liquid stools around possible deeper area of constipation as possibility, may benefit from imaging KUB next time if symptomatic  Plan: 1. Reassurance, since  resolved - Prevention - increase fiber in diet, supplement, bulking - Remain hydrated - Consider Imodium PRN 2. Defer stool testing culture / C Diff / o&p - given no active diarrhea 3. Return criteria reviewed - if recurrent flare will check stool studies - otherwise consider future GI ref for colonoscopy (last done many years ago) also consider digestive enzymes if needed         No orders of the defined types were placed in this encounter.   Follow up plan: Return if symptoms worsen or fail to improve, for diarrhea.  [facility]. He will request that it be sent to our office.  Nobie Putnam, [facility] 08/06/2017, 10:26 PM

## 2017-10-27 ENCOUNTER — Other Ambulatory Visit: Payer: Self-pay

## 2017-10-27 ENCOUNTER — Encounter: Payer: Self-pay | Admitting: Family Medicine

## 2017-10-27 ENCOUNTER — Ambulatory Visit (INDEPENDENT_AMBULATORY_CARE_PROVIDER_SITE_OTHER): Payer: Medicare Other | Admitting: Family Medicine

## 2017-10-27 VITALS — BP 133/58 | HR 64 | Temp 97.9°F | Ht 69.0 in | Wt 201.4 lb

## 2017-10-27 DIAGNOSIS — R197 Diarrhea, unspecified: Secondary | ICD-10-CM

## 2017-10-27 NOTE — Patient Instructions (Addendum)
Thank you for coming to the office today.  Referral to GI specialist next for chronic watery diarrhea - as discussed we considered stool testing today but it seems less likely to be infectious cause, so they can consider these tests in the office and they can discuss colonoscopy and next step  Call them after 2 weeks if you have not heard any update and check status  [facility]) [address], [place] [address] Phone: [phone]  [facility]) [address] [place], [place] [address] Main: [phone]  May take Imodium OTC as needed for acute diarrhea episodes if needed. Do not take everyday.  Please schedule a Follow-up Appointment to: Return in about 3 months (around 01/27/2018), or if symptoms worsen or fail to improve, for diarrhea, gi follow-up.  If you have any other questions or concerns, please feel free to call the office or send a message through [place]. You may also schedule an earlier appointment if necessary.  Additionally, you may be receiving a survey about your experience at our office within a few days to 1 week by e-mail or mail. We value your feedback.  Nobie Putnam, DO [place]

## 2017-10-27 NOTE — Progress Notes (Signed)
Subjective:    Patient ID: Elijah Ponder Sr., male    DOB: [birth year], 83 y.o.   MRN: [id number]  Elijah Bady Sr. is a 83 y.o. male presenting on 10/27/2017 for Diarrhea (after every meal x 2 mths )  Patient presents for a same day appointment. Patient provides majority of history, he is accompanied by his wife, Crystal who provides additional history.  HPI   FOLLOW-UP DIARRHEA, watery, recurrent episodes / postprandial Reviewed prior visits for same problem since 12/2016, most recent 08/2017 - ultimately his symptoms seemed to wax and wane and ultimately thought was linked to some dietary triggers with meats and also maybe viral gastro etiology, he had same problem back in 2017 with acute illness and he was tested with GI panel and showed positive enterovirus and negative C Diff testing. Of note we did not pursue acute GI pathogen stool testing last time given acuity of problem and since it was resolving. - Interval history - has had persistent problem, despite dietary and conservative recommendations of avoiding triggers, inc fiber, and probiotics.  - Today patient reports recurrence of same problem again, and seems to be persistent with more regularity now, seems worsening over past 9-12 months, but over past 3-4 months seems worse. He now admits watery diarrhea after EVERY meal with solid food, if not eating or only liquids he does not have diarrhea. Describes a generalized abdominal discomfort within few minutes of eating and then will promptly have trigger to go to bathroom with watery diarrhea. There is some urgency to these episodes. He is able to eat regular diet and stay hydrated.  - Some potential dietary trigger thought related to red meat or tomatoes, but ultimately still has problem  - No recent sick contacts. No recent antibiotics. No new exposures (outdoor, environmental, unusual foods, daycare or young children). - Pertinent history, s/p cholecystectomy, and thinks that this may  be related, but in general this was not affecting him in the past, only certain foods.  Last colonoscopy done through the [place]. Do not have record available at this time. He preferred to not go through [place] this time for this problem, due to travel would like care locally.  - Denies any active diarrhea, nausea vomiting, blood in stool, dark stool, fever, chills sweats, abdominal pain, bloating, dyspnea, cough, URI symptoms  Depression screen [facility] 2/9 09/07/2016  Decreased Interest 0  Down, Depressed, Hopeless 0  PHQ - 2 Score 0    Social History   Tobacco Use  . Smoking status: Former Smoker    Last attempt to quit: 04/06/1970    Years since quitting: 47.5  . Smokeless tobacco: Former Network engineer Use Topics  . Alcohol use: Yes    Alcohol/week: 0.0 oz    Comment: Consumes alcohol on occasion  . Drug use: No    Review of Systems Per HPI unless specifically indicated above     Objective:    BP (!) 133/58 (BP Location: Right Arm, Patient Position: Sitting, Cuff Size: Normal)   Pulse 64   Temp 97.9 F (36.6 C) (Oral)   Ht 5\' 9"  (1.753 m)   Wt 201 lb 6.4 oz (91.4 kg)   BMI 29.74 kg/m   Wt Readings from Last 3 Encounters:  10/27/17 201 lb 6.4 oz (91.4 kg)  08/06/17 200 lb 12.8 oz (91.1 kg)  02/11/17 194 lb 4 oz (88.1 kg)    Physical Exam  Constitutional: He is oriented to person, place, and time. He appears well-developed  and well-nourished. No distress.  Well-appearing, comfortable, cooperative  HENT:  Head: Normocephalic and atraumatic.  Mouth/Throat: Oropharynx is clear and moist.  Eyes: Conjunctivae are normal. Right eye exhibits no discharge. Left eye exhibits no discharge.  Cardiovascular: Normal rate.  Pulmonary/Chest: Effort normal.  Abdominal: Soft. Bowel sounds are normal. He exhibits no distension and no mass. There is no tenderness. There is no rebound and no guarding.  Musculoskeletal: He exhibits no edema.  Neurological: He is alert and oriented to person,  place, and time.  Skin: Skin is warm and dry. No rash noted. He is not diaphoretic. No erythema.  Psychiatric: He has a normal mood and affect. His behavior is normal.  Well groomed, good eye contact, normal speech and thoughts  Nursing note and vitals reviewed.  Results for orders placed or performed during the hospital encounter of 01/12/17  MRSA PCR Screening  Result Value Ref Range   MRSA by PCR NEGATIVE NEGATIVE  Lipase, blood  Result Value Ref Range   Lipase 28 11 - 51 U/L  Comprehensive metabolic panel  Result Value Ref Range   Sodium 137 135 - 145 mmol/L   Potassium 4.1 3.5 - 5.1 mmol/L   Chloride 106 101 - 111 mmol/L   CO2 18 (L) 22 - 32 mmol/L   Glucose, Bld 120 (H) 65 - 99 mg/dL   BUN 20 6 - 20 mg/dL   Creatinine, Ser 1.18 0.61 - 1.24 mg/dL   Calcium 8.8 (L) 8.9 - 10.3 mg/dL   Total Protein 6.8 6.5 - 8.1 g/dL   Albumin 3.2 (L) 3.5 - 5.0 g/dL   AST 86 (H) 15 - 41 U/L   ALT 116 (H) 17 - 63 U/L   Alkaline Phosphatase 137 (H) 38 - 126 U/L   Total Bilirubin 1.2 0.3 - 1.2 mg/dL   GFR calc non Af Amer 56 (L) >60 mL/min   GFR calc Af Amer >60 >60 mL/min   Anion gap 13 5 - 15  CBC  Result Value Ref Range   WBC 8.8 4.0 - 10.5 K/uL   RBC 4.34 4.22 - 5.81 MIL/uL   Hemoglobin 13.7 13.0 - 17.0 g/dL   HCT 40.1 39.0 - 52.0 %   MCV 92.4 78.0 - 100.0 fL   MCH 31.6 26.0 - 34.0 pg   MCHC 34.2 30.0 - 36.0 g/dL   RDW 13.3 11.5 - 15.5 %   Platelets 238 150 - 400 K/uL  Urinalysis, Routine w reflex microscopic  Result Value Ref Range   Color, Urine YELLOW YELLOW   APPearance HAZY (A) CLEAR   Specific Gravity, Urine 1.025 1.005 - 1.030   pH 5.0 5.0 - 8.0   Glucose, UA NEGATIVE NEGATIVE mg/dL   Hgb urine dipstick SMALL (A) NEGATIVE   Bilirubin Urine NEGATIVE NEGATIVE   Ketones, ur 20 (A) NEGATIVE mg/dL   Protein, ur NEGATIVE NEGATIVE mg/dL   Nitrite NEGATIVE NEGATIVE   Leukocytes, UA NEGATIVE NEGATIVE   RBC / HPF 0-5 0 - 5 RBC/hpf   WBC, UA 0-5 0 - 5 WBC/hpf   Bacteria, UA  NONE SEEN NONE SEEN   Squamous Epithelial / LPF NONE SEEN NONE SEEN   Mucus PRESENT   Heparin level (unfractionated)  Result Value Ref Range   Heparin Unfractionated 0.28 (L) 0.30 - 0.70 IU/mL  CBC  Result Value Ref Range   WBC 6.5 4.0 - 10.5 K/uL   RBC 3.99 (L) 4.22 - 5.81 MIL/uL   Hemoglobin 12.5 (L) 13.0 - 17.0 g/dL  HCT 37.0 (L) 39.0 - 52.0 %   MCV 92.7 78.0 - 100.0 fL   MCH 31.3 26.0 - 34.0 pg   MCHC 33.8 30.0 - 36.0 g/dL   RDW 13.2 11.5 - 15.5 %   Platelets 223 150 - 400 K/uL  Troponin I  Result Value Ref Range   Troponin I 0.06 (HH) <0.03 ng/mL  Troponin I  Result Value Ref Range   Troponin I 0.05 (HH) <0.03 ng/mL  Troponin I  Result Value Ref Range   Troponin I 0.04 (HH) <0.03 ng/mL  Basic metabolic panel  Result Value Ref Range   Sodium 137 135 - 145 mmol/L   Potassium 4.2 3.5 - 5.1 mmol/L   Chloride 104 101 - 111 mmol/L   CO2 20 (L) 22 - 32 mmol/L   Glucose, Bld 86 65 - 99 mg/dL   BUN 21 (H) 6 - 20 mg/dL   Creatinine, Ser 1.19 0.61 - 1.24 mg/dL   Calcium 8.5 (L) 8.9 - 10.3 mg/dL   GFR calc non Af Amer 56 (L) >60 mL/min   GFR calc Af Amer >60 >60 mL/min   Anion gap 13 5 - 15  CBC  Result Value Ref Range   WBC 7.1 4.0 - 10.5 K/uL   RBC 3.82 (L) 4.22 - 5.81 MIL/uL   Hemoglobin 11.9 (L) 13.0 - 17.0 g/dL   HCT 35.2 (L) 39.0 - 52.0 %   MCV 92.1 78.0 - 100.0 fL   MCH 31.2 26.0 - 34.0 pg   MCHC 33.8 30.0 - 36.0 g/dL   RDW 13.2 11.5 - 15.5 %   Platelets 300 150 - 400 K/uL  I-Stat CG4 Lactic Acid, ED  Result Value Ref Range   Lactic Acid, Venous 1.87 0.5 - 1.9 mmol/L  I-stat troponin, ED  Result Value Ref Range   Troponin i, poc 0.02 0.00 - 0.08 ng/mL   Comment 3          ECHOCARDIOGRAM COMPLETE  Result Value Ref Range   Weight 3,065.28 oz   Height 69 in   BP 131/67 mmHg   LV PW d 12 (A) 0.6 - 1.1 mm   FS 28 28 - 44 %   LA vol 59.1 mL   Ao-asc 35 cm   LA ID, A-P, ES 46 mm   IVS/LV PW RATIO, ED 1.17    LV e' LATERAL 5.55 cm/s   LV E/e' medial 14.32     LV E/e'average 14.32    LA diam index 2.22 cm/m2   LA vol A4C 70.8 ml   Area-P 1/2 1.68 cm2   E decel time 250 msec   LVOT diameter 20 mm   LVOT area 3.14 cm2   Peak grad 3 mmHg   E/e' ratio 14.32    MV pk E vel 79.5 m/s   P 1/2 time 73 ms   MV pk A vel 88.8 m/s   LA vol index 28.5 mL/m2   MV Dec 250    LA diam end sys 46.00 mm   TDI e' medial 5.11    TDI e' lateral 5.55    Lateral S' vel 21.10 cm/sec   TAPSE 24.40 mm      Assessment & Plan:   Problem List Items Addressed This Visit    Diarrhea - Primary   Relevant Orders   Ambulatory referral to Gastroenterology      Chronic recurrent postprandial watery diarrhea over nearly >1 year. Worsening with inc frequency. Uncertain etiology, thought dietary,  unable to isolate trigger. No significant other symptoms for infectious etiology, but cannot rule out. Currently well appearing and non-toxic, clinically well hydrated on exam, benign abdomen H/o 03/2016-04/2016 hospitalization, C diff was negative Cannot rule out functional cause such as IBS, also considered possible digestive enzyme related, s/p cholecystectomy  Plan: 1. Discussion on diagnosis and options - Offered stool testing at this time for infectious etiology - ultimately we agreed that this was less likely and they prefer to discuss directly with GI specialist and they are interested in repeat colonoscopy primarily. Defer Stool testing gi pathogen / ova & parasite, C Diff for now - may be tested by GI if indicated - Recommend continue preventative measures, maintain hydration, regular meals, inc fiber - May try Imodium PRN OTC - do not take regularly but follow med instructions OTC advised only acute situations to slow down - Referral to AGI - Return criteria reviewed   No orders of the defined types were placed in this encounter.  Orders Placed This Encounter  Procedures  . Ambulatory referral to Gastroenterology    Referral Priority:   Routine     Referral Type:   Consultation    Referral Reason:   Specialty Services Required    Number of Visits Requested:   1    Follow up plan: Return in about 3 months (around 01/27/2018), or if symptoms worsen or fail to improve, for diarrhea, gi follow-up.  Nobie Putnam, [facility] 10/27/2017, 2:02 PM

## 2017-11-25 ENCOUNTER — Encounter: Payer: Self-pay | Admitting: Family Medicine

## 2017-11-30 ENCOUNTER — Ambulatory Visit (INDEPENDENT_AMBULATORY_CARE_PROVIDER_SITE_OTHER): Payer: Medicare Other | Admitting: Gastroenterology

## 2017-11-30 ENCOUNTER — Encounter: Payer: Self-pay | Admitting: Gastroenterology

## 2017-11-30 ENCOUNTER — Encounter

## 2017-11-30 VITALS — BP 97/59 | HR 74 | Ht 69.0 in | Wt 197.4 lb

## 2017-11-30 DIAGNOSIS — R197 Diarrhea, unspecified: Secondary | ICD-10-CM | POA: Diagnosis not present

## 2017-11-30 NOTE — Progress Notes (Signed)
Jonathon Bellows MD, MRCP(U.K) [address]  [place]  [place], [place] [address]  Main: [phone]  Fax: [phone]   Gastroenterology Consultation  Referring Provider:     Nobie Putnam * Primary Care Physician:  Olin Hauser, DO Primary Gastroenterologist:  Dr. Jonathon Bellows  Reason for Consultation:     Diarrhea         HPI:   Elijah Rawl Sr. is a 83 y.o. y/o male referred for consultation & management  by Dr. Parks Ranger, Devonne Doughty, DO.    He has been referred for diarrhea. Was ongoing for a month and stopped yesterday morning and had a regular bowel movement . Quit drinking coffee and stopped almost instantly. Denies any blood in his stools. Not happened before.  Was consuming upto 6 cups of coffee a day and had 3 bowel movements a day .   May have lost 2 lbs. Has had a colonoscopy 12 years back and was normal . Denies any abdominal pain or another symptoms.     Past Medical History:  Diagnosis Date  . Dyslipidemia   . GERD (gastroesophageal reflux disease)   . Glaucoma   . Hyperlipidemia   . Hypertension   . Insomnia   . Myasthenia gravis ([place]) 09/08/2012  . Obesity   . Ocular myasthenia gravis ([place])   . Prostate cancer [facility])     Past Surgical History:  Procedure Laterality Date  . CATARACT EXTRACTION Bilateral   . CHOLECYSTECTOMY    . TRANSURETHRAL RESECTION OF PROSTATE      Prior to Admission medications   Medication Sig Start Date End Date Taking? Authorizing Provider  acetaminophen (TYLENOL) 325 MG tablet Take 1 tablet (325 mg total) by mouth every 6 (six) hours as needed for mild pain (or Fever >/= 101). 01/10/17  Yes Gouru, Illene Silver, MD  apixaban (ELIQUIS) 5 MG TABS tablet Take 1 tablet (5 mg total) by mouth 2 (two) times daily. 02/24/17  Yes Karamalegos, Devonne Doughty, DO  atorvastatin (LIPITOR) 10 MG tablet Take 10 mg by mouth daily.   Yes [provider]  feeding supplement, ENSURE ENLIVE, (ENSURE ENLIVE) LIQD Take 237  mLs by mouth 2 (two) times daily between meals. 01/10/17  Yes Gouru, Aruna, MD  latanoprost (XALATAN) 0.005 % ophthalmic solution Place 1 drop into both eyes at bedtime.   Yes [provider]  lisinopril (PRINIVIL,ZESTRIL) 40 MG tablet Take 0.5 tablets (20 mg total) by mouth daily. 01/10/17  Yes Gouru, Illene Silver, MD  omeprazole (PRILOSEC) 20 MG capsule Take 2 capsules (40 mg total) by mouth 2 (two) times daily before a meal. 04/17/16  Yes Rizwan, Eunice Blase, MD  predniSONE (DELTASONE) 5 MG tablet Take 5 mg by mouth daily. 05/15/14  Yes [provider]  ondansetron (ZOFRAN ODT) 4 MG disintegrating tablet Take 1 tablet (4 mg total) by mouth every 8 (eight) hours as needed for nausea or vomiting. Patient not taking: Reported on 10/27/2017 12/24/16   Olin Hauser, DO  senna-docusate (SENOKOT-S) 8.6-50 MG tablet Take 1 tablet by mouth at bedtime as needed for mild constipation. Patient not taking: Reported on 10/27/2017 01/10/17   Nicholes Mango, MD    Family History  Problem Relation Age of Onset  . Heart attack Father      Social History   Tobacco Use  . Smoking status: Former Smoker    Last attempt to quit: 04/06/1970    Years since quitting: 47.6  . Smokeless tobacco: Former Network engineer Use Topics  .  Alcohol use: Yes    Alcohol/week: 0.0 standard drinks    Comment: Consumes alcohol on occasion  . Drug use: No    Allergies as of 11/30/2017  . (No Known Allergies)    Review of Systems:    All systems reviewed and negative except where noted in HPI.   Physical Exam:  BP (!) 97/59   Pulse 74   Ht 5\' 9"  (1.753 m)   Wt 197 lb 6.4 oz (89.5 kg)   BMI 29.15 kg/m  No LMP for male patient. Psych:  Alert and cooperative. Normal mood and affect. General:   Alert,  Well-developed, well-nourished, pleasant and cooperative in NAD Head:  Normocephalic and atraumatic. Eyes:  Sclera clear, no icterus.   Conjunctiva pink. Ears:  Normal auditory acuity. Nose:  No deformity,  discharge, or lesions. Mouth:  No deformity or lesions,oropharynx pink & moist. Neck:  Supple; no masses or thyromegaly. Lungs:  Respirations even and unlabored.  Clear throughout to auscultation.   No wheezes, crackles, or rhonchi. No acute distress. Heart:  Regular rate and rhythm; no murmurs, clicks, rubs, or gallops. Abdomen:  Normal bowel sounds.  No bruits.  Soft, non-tender and non-distended without masses, hepatosplenomegaly or hernias noted.  No guarding or rebound tenderness.    Neurologic:  Alert and oriented x3;  grossly normal neurologically. Skin:  Intact without significant lesions or rashes. No jaundice. Lymph Nodes:  No significant cervical adenopathy. Psych:  Alert and cooperative. Normal mood and affect.  Imaging Studies: No results found.  Assessment and Plan:   Elijah Rainey Sr. is a 83 y.o. y/o male has been referred for diarrhea ongoing for 4 weeks that stopped right after he quit coffee. Has had solid bowel movements since   Return to clinic as needed   Dr Jonathon Bellows MD,MRCP(U.K)

## 2018-03-31 ENCOUNTER — Other Ambulatory Visit: Payer: Self-pay

## 2018-03-31 ENCOUNTER — Encounter: Payer: Self-pay | Admitting: Medical Oncology

## 2018-03-31 ENCOUNTER — Ambulatory Visit: Payer: Medicare Other | Admitting: Nurse Practitioner

## 2018-03-31 ENCOUNTER — Inpatient Hospital Stay: Payer: Medicare Other

## 2018-03-31 ENCOUNTER — Emergency Department: Payer: Medicare Other

## 2018-03-31 ENCOUNTER — Inpatient Hospital Stay
Admit: 2018-03-31 | Discharge: 2018-03-31 | Disposition: A | Discharge status: Home / Self Care | Payer: Medicare Other | Attending: Internal Medicine | Admitting: Internal Medicine

## 2018-03-31 ENCOUNTER — Inpatient Hospital Stay
Admission: EM | Admit: 2018-03-31 | Discharge: 2018-04-01 | DRG: 193 | Disposition: A | Discharge status: Home / Self Care | Payer: Medicare Other | Attending: Family Medicine | Admitting: Family Medicine

## 2018-03-31 DIAGNOSIS — I959 Hypotension, unspecified: Secondary | ICD-10-CM | POA: Diagnosis present

## 2018-03-31 DIAGNOSIS — E785 Hyperlipidemia, unspecified: Secondary | ICD-10-CM | POA: Diagnosis present

## 2018-03-31 DIAGNOSIS — K219 Gastro-esophageal reflux disease without esophagitis: Secondary | ICD-10-CM | POA: Diagnosis present

## 2018-03-31 DIAGNOSIS — Z9841 Cataract extraction status, right eye: Secondary | ICD-10-CM

## 2018-03-31 DIAGNOSIS — J189 Pneumonia, unspecified organism: Principal | ICD-10-CM | POA: Diagnosis present

## 2018-03-31 DIAGNOSIS — Z9049 Acquired absence of other specified parts of digestive tract: Secondary | ICD-10-CM

## 2018-03-31 DIAGNOSIS — Z79899 Other long term (current) drug therapy: Secondary | ICD-10-CM

## 2018-03-31 DIAGNOSIS — E860 Dehydration: Secondary | ICD-10-CM | POA: Diagnosis present

## 2018-03-31 DIAGNOSIS — R55 Syncope and collapse: Secondary | ICD-10-CM

## 2018-03-31 DIAGNOSIS — G4700 Insomnia, unspecified: Secondary | ICD-10-CM | POA: Diagnosis present

## 2018-03-31 DIAGNOSIS — Z8249 Family history of ischemic heart disease and other diseases of the circulatory system: Secondary | ICD-10-CM

## 2018-03-31 DIAGNOSIS — Z86711 Personal history of pulmonary embolism: Secondary | ICD-10-CM | POA: Diagnosis not present

## 2018-03-31 DIAGNOSIS — Z8546 Personal history of malignant neoplasm of prostate: Secondary | ICD-10-CM | POA: Diagnosis not present

## 2018-03-31 DIAGNOSIS — J181 Lobar pneumonia, unspecified organism: Secondary | ICD-10-CM

## 2018-03-31 DIAGNOSIS — N170 Acute kidney failure with tubular necrosis: Secondary | ICD-10-CM | POA: Diagnosis present

## 2018-03-31 DIAGNOSIS — Z7952 Long term (current) use of systemic steroids: Secondary | ICD-10-CM

## 2018-03-31 DIAGNOSIS — Z9842 Cataract extraction status, left eye: Secondary | ICD-10-CM | POA: Diagnosis not present

## 2018-03-31 DIAGNOSIS — G4733 Obstructive sleep apnea (adult) (pediatric): Secondary | ICD-10-CM | POA: Diagnosis present

## 2018-03-31 DIAGNOSIS — R531 Weakness: Secondary | ICD-10-CM

## 2018-03-31 DIAGNOSIS — Z9079 Acquired absence of other genital organ(s): Secondary | ICD-10-CM | POA: Diagnosis not present

## 2018-03-31 DIAGNOSIS — I10 Essential (primary) hypertension: Secondary | ICD-10-CM | POA: Diagnosis not present

## 2018-03-31 DIAGNOSIS — H409 Unspecified glaucoma: Secondary | ICD-10-CM | POA: Diagnosis present

## 2018-03-31 DIAGNOSIS — G7000 Myasthenia gravis without (acute) exacerbation: Secondary | ICD-10-CM | POA: Diagnosis present

## 2018-03-31 DIAGNOSIS — Z7901 Long term (current) use of anticoagulants: Secondary | ICD-10-CM

## 2018-03-31 DIAGNOSIS — Z85828 Personal history of other malignant neoplasm of skin: Secondary | ICD-10-CM | POA: Diagnosis not present

## 2018-03-31 DIAGNOSIS — Z87891 Personal history of nicotine dependence: Secondary | ICD-10-CM

## 2018-03-31 LAB — URINALYSIS, ROUTINE W REFLEX MICROSCOPIC
Bacteria, UA: NONE SEEN
Bilirubin Urine: NEGATIVE
GLUCOSE, UA: NEGATIVE mg/dL
Ketones, ur: 20 mg/dL — AB
Leukocytes, UA: NEGATIVE
NITRITE: NEGATIVE
PH: 5 (ref 5.0–8.0)
PROTEIN: NEGATIVE mg/dL
Specific Gravity, Urine: 1.044 — ABNORMAL HIGH (ref 1.005–1.030)
Squamous Epithelial / LPF: NONE SEEN (ref 0–5)

## 2018-03-31 LAB — COMPREHENSIVE METABOLIC PANEL
ALT: 13 U/L (ref 0–44)
ANION GAP: 7 (ref 5–15)
AST: 18 U/L (ref 15–41)
Albumin: 3.4 g/dL — ABNORMAL LOW (ref 3.5–5.0)
Alkaline Phosphatase: 46 U/L (ref 38–126)
BUN: 22 mg/dL (ref 8–23)
CO2: 21 mmol/L — AB (ref 22–32)
Calcium: 8.4 mg/dL — ABNORMAL LOW (ref 8.9–10.3)
Chloride: 106 mmol/L (ref 98–111)
Creatinine, Ser: 1.47 mg/dL — ABNORMAL HIGH (ref 0.61–1.24)
GFR, EST AFRICAN AMERICAN: 51 mL/min — AB (ref >60)
GFR, EST NON AFRICAN AMERICAN: 44 mL/min — AB (ref >60)
Glucose, Bld: 123 mg/dL — ABNORMAL HIGH (ref 70–99)
POTASSIUM: 3.7 mmol/L (ref 3.5–5.1)
Sodium: 134 mmol/L — ABNORMAL LOW (ref 135–145)
Total Bilirubin: 1.2 mg/dL (ref 0.3–1.2)
Total Protein: 6.4 g/dL — ABNORMAL LOW (ref 6.5–8.1)

## 2018-03-31 LAB — CBC
HCT: 38.5 % — ABNORMAL LOW (ref 39.0–52.0)
Hemoglobin: 13.1 g/dL (ref 13.0–17.0)
MCH: 31.4 pg (ref 26.0–34.0)
MCHC: 34 g/dL (ref 30.0–36.0)
MCV: 92.3 fL (ref 80.0–100.0)
PLATELETS: 149 10*3/uL — AB (ref 150–400)
RBC: 4.17 MIL/uL — AB (ref 4.22–5.81)
RDW: 13.1 % (ref 11.5–15.5)
WBC: 9.1 10*3/uL (ref 4.0–10.5)
nRBC: 0 % (ref 0.0–0.2)

## 2018-03-31 LAB — CK: CK TOTAL: 32 U/L — AB (ref 49–397)

## 2018-03-31 LAB — ECHOCARDIOGRAM COMPLETE
Height: 69 in
Weight: 3006.4 oz

## 2018-03-31 LAB — INFLUENZA PANEL BY PCR (TYPE A & B)
INFLBPCR: NEGATIVE
Influenza A By PCR: NEGATIVE

## 2018-03-31 LAB — TROPONIN I: Troponin I: 0.03 ng/mL (ref <0.03)

## 2018-03-31 MED ORDER — CHOLESTYRAMINE LIGHT 4 G PO PACK
8.0000 g | PACK | Freq: Every day | ORAL | Status: DC
  Filled 2018-03-31: qty 2

## 2018-03-31 MED ORDER — SODIUM CHLORIDE 0.9 % IV SOLN
1.0000 g | INTRAVENOUS | Status: DC
  Filled 2018-03-31: qty 10

## 2018-03-31 MED ORDER — AZITHROMYCIN 500 MG PO TABS
250.0000 mg | ORAL_TABLET | Freq: Every day | ORAL | Status: DC
  Administered 2018-04-01: 250 mg via ORAL
  Filled 2018-03-31: qty 1
  Filled 2018-03-31: qty 0.5

## 2018-03-31 MED ORDER — PREDNISONE 10 MG PO TABS
5.0000 mg | ORAL_TABLET | Freq: Every day | ORAL | Status: DC
  Administered 2018-04-01: 5 mg via ORAL
  Filled 2018-03-31: qty 1

## 2018-03-31 MED ORDER — LATANOPROST 0.005 % OP SOLN
1.0000 [drp] | Freq: Every day | OPHTHALMIC | Status: DC
  Administered 2018-03-31: 1 [drp] via OPHTHALMIC
  Filled 2018-03-31: qty 2.5

## 2018-03-31 MED ORDER — APIXABAN 5 MG PO TABS
5.0000 mg | ORAL_TABLET | Freq: Two times a day (BID) | ORAL | Status: DC
  Administered 2018-03-31: 5 mg via ORAL
  Filled 2018-03-31: qty 1

## 2018-03-31 MED ORDER — SODIUM CHLORIDE 0.9 % IV BOLUS
1000.0000 mL | Freq: Once | INTRAVENOUS | Status: AC
  Administered 2018-03-31: 1000 mL via INTRAVENOUS

## 2018-03-31 MED ORDER — DOCUSATE SODIUM 100 MG PO CAPS
100.0000 mg | ORAL_CAPSULE | Freq: Two times a day (BID) | ORAL | Status: DC
  Filled 2018-03-31: qty 1

## 2018-03-31 MED ORDER — ENSURE ENLIVE PO LIQD
237.0000 mL | Freq: Two times a day (BID) | ORAL | Status: DC
  Administered 2018-03-31: 237 mL via ORAL

## 2018-03-31 MED ORDER — PREDNISONE 10 MG PO TABS: 5.0000 mg | ORAL_TABLET | Freq: Every day | ORAL | Status: DC

## 2018-03-31 MED ORDER — BISACODYL 5 MG PO TBEC: 5.0000 mg | DELAYED_RELEASE_TABLET | Freq: Every day | ORAL | Status: DC | PRN

## 2018-03-31 MED ORDER — CHOLESTYRAMINE LIGHT 4 G PO PACK
8.0000 g | PACK | Freq: Every day | ORAL | Status: DC
  Administered 2018-03-31: 8 g via ORAL
  Filled 2018-03-31: qty 2

## 2018-03-31 MED ORDER — MEMANTINE HCL 5 MG PO TABS
5.0000 mg | ORAL_TABLET | Freq: Every day | ORAL | Status: DC
  Administered 2018-03-31: 5 mg via ORAL
  Filled 2018-03-31: qty 1

## 2018-03-31 MED ORDER — IOPAMIDOL (ISOVUE-370) INJECTION 76%
75.0000 mL | Freq: Once | INTRAVENOUS | Status: AC | PRN
  Administered 2018-03-31: 60 mL via INTRAVENOUS

## 2018-03-31 MED ORDER — SODIUM CHLORIDE 0.9 % IV SOLN
1.0000 g | Freq: Once | INTRAVENOUS | Status: AC
  Administered 2018-03-31: 1 g via INTRAVENOUS
  Filled 2018-03-31: qty 10

## 2018-03-31 MED ORDER — SODIUM CHLORIDE 0.9 % IV SOLN
INTRAVENOUS | Status: DC
  Administered 2018-03-31 – 2018-04-01 (×2): via INTRAVENOUS

## 2018-03-31 MED ORDER — MEMANTINE HCL 5 MG PO TABS
10.0000 mg | ORAL_TABLET | Freq: Every day | ORAL | Status: DC
  Filled 2018-03-31: qty 2

## 2018-03-31 MED ORDER — ONDANSETRON HCL 4 MG/2ML IJ SOLN: 4.0000 mg | Freq: Four times a day (QID) | INTRAMUSCULAR | Status: DC | PRN

## 2018-03-31 MED ORDER — PANTOPRAZOLE SODIUM 20 MG PO TBEC
20.0000 mg | DELAYED_RELEASE_TABLET | Freq: Two times a day (BID) | ORAL | Status: DC
  Administered 2018-03-31 – 2018-04-01 (×2): 20 mg via ORAL
  Filled 2018-03-31 (×4): qty 1

## 2018-03-31 MED ORDER — ATORVASTATIN CALCIUM 20 MG PO TABS
10.0000 mg | ORAL_TABLET | Freq: Every day | ORAL | Status: DC
  Administered 2018-03-31: 10 mg via ORAL
  Filled 2018-03-31: qty 1

## 2018-03-31 MED ORDER — MEMANTINE HCL 5 MG PO TABS
5.0000 mg | ORAL_TABLET | Freq: Every day | ORAL | Status: DC
  Filled 2018-03-31: qty 1

## 2018-03-31 MED ORDER — ONDANSETRON HCL 4 MG PO TABS: 4.0000 mg | ORAL_TABLET | Freq: Four times a day (QID) | ORAL | Status: DC | PRN

## 2018-03-31 MED ORDER — AZITHROMYCIN 500 MG PO TABS
500.0000 mg | ORAL_TABLET | Freq: Every day | ORAL | Status: AC
  Administered 2018-03-31: 500 mg via ORAL
  Filled 2018-03-31: qty 1
  Filled 2018-03-31: qty 2

## 2018-03-31 MED ORDER — APIXABAN 5 MG PO TABS
5.0000 mg | ORAL_TABLET | Freq: Two times a day (BID) | ORAL | Status: DC
  Administered 2018-04-01: 5 mg via ORAL
  Filled 2018-03-31: qty 1

## 2018-03-31 MED ORDER — SODIUM CHLORIDE 0.9 % IV SOLN: 500.0000 mg | Freq: Once | INTRAVENOUS | Status: DC

## 2018-03-31 MED ORDER — ACETAMINOPHEN 650 MG RE SUPP: 650.0000 mg | Freq: Four times a day (QID) | RECTAL | Status: DC | PRN

## 2018-03-31 MED ORDER — ACETAMINOPHEN 325 MG PO TABS: 650.0000 mg | ORAL_TABLET | Freq: Four times a day (QID) | ORAL | Status: DC | PRN

## 2018-03-31 MED ORDER — PANTOPRAZOLE SODIUM 40 MG PO TBEC
40.0000 mg | DELAYED_RELEASE_TABLET | Freq: Two times a day (BID) | ORAL | Status: DC
  Filled 2018-03-31: qty 1

## 2018-03-31 NOTE — ED Notes (Signed)
Attempted report after 32 mins - unable to take report at this time. Charge aware.

## 2018-03-31 NOTE — H&P (Signed)
[facility] [place] NAME: Cannan Beeck    MR#:  [id number]  DATE OF BIRTH:  Nov 07, 1934  DATE OF ADMISSION:  03/31/2018  PRIMARY CARE PHYSICIAN: Olin Hauser, DO   REQUESTING/REFERRING PHYSICIAN: Dr. Collier Salina.  CHIEF COMPLAINT:   Chief Complaint  Patient presents with  . Near Syncope    HISTORY OF PRESENT ILLNESS:  Gerome Kokesh  is a 83 y.o. male with a known history of hyperlipidemia, hypertension, myasthenia gravis, GERD brought in because of syncope.  Patient passed out this morning, slid down to buttocks, lost consciousness for about couple of seconds.    Patient noted to have hypotension BP of 90/50 when EMS arrived.  Has been having some cough for 6 months as per wife, patient was mated in October for pneumonia and discharged with antibiotics and the wife took him to: Because of shortness of breath and found to have PE.  Patient is on Eliquis for that.  Has been having some cough since last discharge and then syncopized today.  Wife is wondering if he has pneumonia.  Patient CT angios chest negative for acute PE but showed right lower lobe pneumonia.  And has been running temperature up to 101.3 Fahrenheit for the past couple of days.  PAST MEDICAL HISTORY:   Past Medical History:  Diagnosis Date  . Dyslipidemia   . GERD (gastroesophageal reflux disease)   . Glaucoma   . Hyperlipidemia   . Hypertension   . Insomnia   . Myasthenia gravis ([place]) 09/08/2012  . Obesity   . Ocular myasthenia gravis ([place])   . Prostate cancer ([place])     PAST SURGICAL HISTOIRY:   Past Surgical History:  Procedure Laterality Date  . CATARACT EXTRACTION Bilateral   . CHOLECYSTECTOMY    . TRANSURETHRAL RESECTION OF PROSTATE      SOCIAL HISTORY:   Social History   Tobacco Use  . Smoking status: Former Smoker    Last attempt to quit: 04/06/1970    Years since quitting: 48.0  . Smokeless tobacco: Former Network engineer  Use Topics  . Alcohol use: Yes    Alcohol/week: 0.0 standard drinks    Comment: Consumes alcohol on occasion    FAMILY HISTORY:   Family History  Problem Relation Age of Onset  . Heart attack Father     DRUG ALLERGIES:  No Known Allergies  REVIEW OF SYSTEMS:  CONSTITUTIONAL:, Awake, oriented.  He feels severely weak. EYES: No blurred or double vision.  EARS, NOSE, AND THROAT: No tinnitus or ear pain.  RESPIRATORY: Cough, shortness of breath.  CARDIOVASCULAR: No chest pain, orthopnea, edema.  GASTROINTESTINAL: No nausea, vomiting, diarrhea or abdominal pain.  GENITOURINARY: No dysuria, hematuria.  ENDOCRINE: No polyuria, nocturia,  HEMATOLOGY: No anemia, easy bruising or bleeding SKIN: No rash or lesion. MUSCULOSKELETAL: No joint pain or arthritis.   NEUROLOGIC: No tingling, numbness, weakness.  PSYCHIATRY: No anxiety or depression.   MEDICATIONS AT HOME:   Prior to Admission medications   Medication Sig Start Date End Date Taking? Authorizing Provider  acetaminophen (TYLENOL) 325 MG tablet Take 1 tablet (325 mg total) by mouth every 6 (six) hours as needed for mild pain (or Fever >/= 101). 01/10/17  Yes Gouru, Illene Silver, MD  apixaban (ELIQUIS) 5 MG TABS tablet Take 1 tablet (5 mg total) by mouth 2 (two) times daily. 02/24/17  Yes Karamalegos, Devonne Doughty, DO  atorvastatin (LIPITOR) 10 MG tablet Take 10 mg by mouth  daily.   Yes [provider]  cholestyramine light (PREVALITE) 4 GM/DOSE powder Take 8 g by mouth daily. Mix in 6 ounces of non-carbonated beverage   Yes [provider]  cyclobenzaprine (FLEXERIL) 10 MG tablet Take 10 mg by mouth at bedtime.   Yes [provider]  feeding supplement, ENSURE ENLIVE, (ENSURE ENLIVE) LIQD Take 237 mLs by mouth 2 (two) times daily between meals. 01/10/17  Yes Gouru, Aruna, MD  latanoprost (XALATAN) 0.005 % ophthalmic solution Place 1 drop into both eyes at bedtime.   Yes [provider]  lisinopril  (PRINIVIL,ZESTRIL) 40 MG tablet Take 0.5 tablets (20 mg total) by mouth daily. Patient taking differently: Take 40 mg by mouth daily.  01/10/17  Yes Gouru, Illene Silver, MD  memantine (NAMENDA) 10 MG tablet Take 10 mg by mouth See admin instructions. Take one-half tablet at bedtime for 2 weeks, then take one tablet at bedtime for 2 weeks, then take one and one-half tablets at bedtime for 2 weeks, then take two tablets at bedtime to slow memory loss   Yes [provider]  omeprazole (PRILOSEC) 20 MG capsule Take 2 capsules (40 mg total) by mouth 2 (two) times daily before a meal. 04/17/16  Yes Rizwan, Eunice Blase, MD  predniSONE (DELTASONE) 5 MG tablet Take 5 mg by mouth daily. 05/15/14  Yes [provider]  zolpidem (AMBIEN) 5 MG tablet Take 5 mg by mouth at bedtime as needed for sleep.   Yes [provider]  ondansetron (ZOFRAN ODT) 4 MG disintegrating tablet Take 1 tablet (4 mg total) by mouth every 8 (eight) hours as needed for nausea or vomiting. Patient not taking: Reported on 10/27/2017 12/24/16   Olin Hauser, DO  senna-docusate (SENOKOT-S) 8.6-50 MG tablet Take 1 tablet by mouth at bedtime as needed for mild constipation. Patient not taking: Reported on 10/27/2017 01/10/17   Nicholes Mango, MD      VITAL SIGNS:  Blood pressure (!) 124/57, pulse 71, temperature 99.4 F (37.4 C), temperature source Oral, resp. rate (!) 29, height 5\' 9"  (1.753 m), weight 86.2 kg, SpO2 95 %.  PHYSICAL EXAMINATION:  GENERAL:  83 y.o.-year-old patient lying in the bed with no acute distress.  EYES: Pupils equal, round, reactive to light HEENT: Head atraumatic, normocephalic. Oropharynx and nasopharynx clear.  NECK:  Supple, no jugular venous distention. No thyroid enlargement, no tenderness.  LUNGS: diminished air entry bilaterally. CARDIOVASCULAR: S1, S2 normal. No murmurs, rubs, or gallops.  ABDOMEN: Soft, nontender, nondistended. Bowel sounds present. No organomegaly or mass.   EXTREMITIES: No pedal edema, cyanosis, or clubbing.  NEUROLOGIC: Cranial nerves II through XII are intact. Muscle strength 5/5 in all extremities. Sensation intact. Gait not checked.  PSYCHIATRIC: The patient is alert and oriented x 3.  SKIN: No obvious rash, lesion, or ulcer.   LABORATORY PANEL:   CBC Recent Labs  Lab 03/31/18 0933  WBC 9.1  HGB 13.1  HCT 38.5*  PLT 149*   ------------------------------------------------------------------------------------------------------------------  Chemistries  Recent Labs  Lab 03/31/18 0933  NA 134*  K 3.7  CL 106  CO2 21*  GLUCOSE 123*  BUN 22  CREATININE 1.47*  CALCIUM 8.4*  AST 18  ALT 13  ALKPHOS 46  BILITOT 1.2   ------------------------------------------------------------------------------------------------------------------  Cardiac Enzymes Recent Labs  Lab 03/31/18 0933  TROPONINI 0.03*   ------------------------------------------------------------------------------------------------------------------  RADIOLOGY:  Dg Chest 2 View  Result Date: 03/31/2018 CLINICAL DATA:  Patient not feeling well.  Fever. EXAM: CHEST - 2 VIEW COMPARISON:  January 12, 2017 FINDINGS: Opacity in the lateral right lung base is much smaller compared October 2018. Scarring in the lingula, unchanged. No other new infiltrate. No nodules or masses. No pneumothorax. The cardiomediastinal silhouette is unremarkable. IMPRESSION: There is opacity in the lateral right lung base. The opacity is actually smaller when compared October 2018. Whether this represents residual scarring at the site of previous opacity or recurrent infiltrate cannot be determined on this single study. Recommend clinical correlation and attention on follow-up. Electronically Signed   By: Dorise Bullion III M.D   On: 03/31/2018 10:59   Ct Angio Chest Pe W And/or Wo Contrast  Result Date: 03/31/2018 CLINICAL DATA:  Cough for several months. Syncope. Fever. Concern for  pulmonary embolism. EXAM: CT ANGIOGRAPHY CHEST WITH CONTRAST TECHNIQUE: Multidetector CT imaging of the chest was performed using the standard protocol during bolus administration of intravenous contrast. Multiplanar CT image reconstructions and MIPs were obtained to evaluate the vascular anatomy. CONTRAST:  82mL ISOVUE-370 IOPAMIDOL (ISOVUE-370) INJECTION 76% COMPARISON:  None. FINDINGS: Cardiovascular: No filling defects within the pulmonary arteries to suggest acute pulmonary embolism. No acute findings of the aorta or great vessels. No pericardial fluid. Mediastinum/Nodes: No axillary supraclavicular adenopathy. No mediastinal hilar adenopathy. No pericardial effusion. Esophagus normal. Subcarinal lymph node is upper limits of normal at 10 mm short axis (image 47/4. Lungs/Pleura: Within the RIGHT lower lobe peribronchial airspace consolidation with air bronchograms is measuring approximately 3.8 x 3.4 cm (image 64/6). No discrete measurable nodularity. LEFT lung clear. Upper Abdomen: Limited view of the liver, kidneys, pancreas are unremarkable. Normal adrenal glands. Musculoskeletal: No aggressive osseous lesion. Review of the MIP images confirms the above findings. IMPRESSION: 1. No evidence acute pulmonary embolism. 2. RIGHT lower lobe pneumonia. Recommend follow-up imaging to ensure resolution and exclude underlying neoplasm (CT in 6 to 8 weeks). 3. Borderline enlarged subcarinal lymph node is likely reactive. Electronically Signed   By: Suzy Bouchard M.D.   On: 03/31/2018 12:18    EKG:   Orders placed or performed during the hospital encounter of 03/31/18  . ED EKG  . ED EKG  . EKG 12-Lead  . EKG 12-Lead   EKG shows normal sinus rhythm with nonspecific ST-T changes, 75 bpm. IMPRESSION AND PLAN:   83 year old male patient with history of PE on Eliquis comes in because of worsening shortness of breath, cough, syncope. 1.  Syncope likely secondary to dehydration, acute kidney injury and also  due to cough syncope: Continue to monitor on telemetry, check echocardiogram, carotid ultrasound, monitor for any arrhythmias.  #2 .right-sided pneumonia, patient CT angios chest did not show acute PE, continue IV antibiotics, wife is very adamant that she does not want to go in 1 day as she was discharged last time and she had to go back to [facility] and diagnosed with acute PE.  Now he does not have acute respiratory failure or hypoxia. #3. acute kidney injury likely due to ATN from hypotension: Continue gentle hydration, monitor kidney function closely.  Avoid nephrotoxic agents.   All the records are reviewed and case discussed with ED provider. Management plans discussed with the patient, family and they are in agreement.  CODE STATUS: Full code TOTAL TIME TAKING CARE OF THIS PATIENT: 55 minutes.    Epifanio Lesches M.D on 03/31/2018 at 1:25 PM  Between 7am to 6pm - Pager - [phone]  After 6pm go to [email or web address] - password [email or web address] [facility]  [facility]  Office  [phone]  CC: Primary care physician;  Olin Hauser, DO  Note: This dictation was prepared with Dragon dictation along with smaller phrase technology. Any transcriptional errors that result from this process are unintentional.

## 2018-03-31 NOTE — ED Notes (Signed)
Pt reminded that we need a urine sample.  

## 2018-03-31 NOTE — ED Provider Notes (Signed)
[facility] Emergency Department Provider Note  Time seen: 9:26 AM  I have reviewed the triage vital signs and the nursing notes.   HISTORY  Chief Complaint Near Syncope    HPI Elijah Mccartin Sr. is a 83 y.o. male with a past medical history of gastric reflux, hypertension, hyperlipidemia, presents to the emergency department after a syncope versus near syncopal episode.  According to the patient for the past 1 month he has been experiencing generalized fatigue and weakness.  States this morning he was sitting up on the side of the bed when he became very lightheaded, had a syncope versus near syncopal episode.  Had apparently defecated on himself and does not remember that either.  Denies any chest pain, cough or congestion.  Denies abdominal pain vomiting or diarrhea.  Denies dysuria.  Denies any known fever although 99.4 in the emergency department.   Past Medical History:  Diagnosis Date  . Dyslipidemia   . GERD (gastroesophageal reflux disease)   . Glaucoma   . Hyperlipidemia   . Hypertension   . Insomnia   . Myasthenia gravis ([place]) 09/08/2012  . Obesity   . Ocular myasthenia gravis ([place])   . Prostate cancer [facility])     Patient Active Problem List   Diagnosis Date Noted  . Left-sided headache 01/25/2017  . OSA (obstructive sleep apnea) 12/24/2016  . Diarrhea 12/24/2016  . Squamous cell carcinoma 09/07/2016  . Cavitating mass of lung 04/17/2016  . Gastritis 04/17/2016  . Recurrent pulmonary emboli ([place]) 04/15/2016  . Essential hypertension 04/14/2016  . HLD (hyperlipidemia) 04/14/2016  . GERD (gastroesophageal reflux disease) 04/14/2016  . Hypokalemia 04/14/2016  . Weakness 04/14/2016  . Postcholecystectomy diarrhea 09/14/2013  . Dizziness and giddiness 08/02/2013  . Chronic cholecystitis 01/25/2013  . Myasthenia gravis ([place]) 09/08/2012    Past Surgical History:  Procedure Laterality Date  . CATARACT EXTRACTION Bilateral   . CHOLECYSTECTOMY     . TRANSURETHRAL RESECTION OF PROSTATE      Prior to Admission medications   Medication Sig Start Date End Date Taking? Authorizing Provider  acetaminophen (TYLENOL) 325 MG tablet Take 1 tablet (325 mg total) by mouth every 6 (six) hours as needed for mild pain (or Fever >/= 101). 01/10/17   Nicholes Mango, MD  apixaban (ELIQUIS) 5 MG TABS tablet Take 1 tablet (5 mg total) by mouth 2 (two) times daily. 02/24/17   Karamalegos, Devonne Doughty, DO  atorvastatin (LIPITOR) 10 MG tablet Take 10 mg by mouth daily.    [provider]  feeding supplement, ENSURE ENLIVE, (ENSURE ENLIVE) LIQD Take 237 mLs by mouth 2 (two) times daily between meals. 01/10/17   Gouru, Illene Silver, MD  latanoprost (XALATAN) 0.005 % ophthalmic solution Place 1 drop into both eyes at bedtime.    [provider]  lisinopril (PRINIVIL,ZESTRIL) 40 MG tablet Take 0.5 tablets (20 mg total) by mouth daily. 01/10/17   Nicholes Mango, MD  omeprazole (PRILOSEC) 20 MG capsule Take 2 capsules (40 mg total) by mouth 2 (two) times daily before a meal. 04/17/16   Debbe Odea, MD  ondansetron (ZOFRAN ODT) 4 MG disintegrating tablet Take 1 tablet (4 mg total) by mouth every 8 (eight) hours as needed for nausea or vomiting. Patient not taking: Reported on 10/27/2017 12/24/16   Olin Hauser, DO  predniSONE (DELTASONE) 5 MG tablet Take 5 mg by mouth daily. 05/15/14   [provider]  senna-docusate (SENOKOT-S) 8.6-50 MG tablet Take 1 tablet by mouth at bedtime as needed  for mild constipation. Patient not taking: Reported on 10/27/2017 01/10/17   Nicholes Mango, MD    No Known Allergies  Family History  Problem Relation Age of Onset  . Heart attack Father     Social History Social History   Tobacco Use  . Smoking status: Former Smoker    Last attempt to quit: 04/06/1970    Years since quitting: 48.0  . Smokeless tobacco: Former Network engineer Use Topics  . Alcohol use: Yes    Alcohol/week: 0.0 standard drinks     Comment: Consumes alcohol on occasion  . Drug use: No    Review of Systems Constitutional: Negative for fever.  Positive for generalized fatigue/weakness x1 month. Eyes: Negative for visual complaints ENT: Negative for recent illness/congestion Cardiovascular: Negative for chest pain. Respiratory: Negative for shortness of breath.  Denies cough. Gastrointestinal: Negative for abdominal pain.  Negative for vomiting.  States longstanding diarrhea x6+ months currently being seen by his doctor at the [place] for this. Genitourinary: Negative for urinary compaints Musculoskeletal: Negative for musculoskeletal complaints Skin: Negative for skin complaints  Neurological: Negative for headache All other ROS negative  ____________________________________________   PHYSICAL EXAM:  VITAL SIGNS: ED Triage Vitals  Enc Vitals Group     BP 03/31/18 0916 111/64     Pulse Rate 03/31/18 0916 73     Resp 03/31/18 0916 18     Temp 03/31/18 0916 99.4 F (37.4 C)     Temp Source 03/31/18 0916 Oral     SpO2 03/31/18 0916 94 %     Weight 03/31/18 0915 190 lb (86.2 kg)     Height 03/31/18 0915 5\' 9"  (1.753 m)     Head Circumference --      Peak Flow --      Pain Score 03/31/18 0915 0     Pain Loc --      Pain Edu? --      Excl. in [place]? --    Constitutional: Alert and oriented. Well appearing and in no distress. Eyes: Normal exam ENT   Head: Normocephalic and atraumatic.   Mouth/Throat: Mucous membranes are moist. Cardiovascular: Normal rate, regular rhythm. No murmur Respiratory: Normal respiratory effort without tachypnea nor retractions. Breath sounds are clear Gastrointestinal: Soft and nontender. No distention. Musculoskeletal: Nontender with normal range of motion in all extremities.  Neurologic:  Normal speech and language. No gross focal neurologic deficits  Skin:  Skin is warm, dry and intact.  Psychiatric: Mood and affect are normal.    ____________________________________________    EKG  EKG viewed and interpreted by myself shows a normal sinus rhythm at 75 bpm with a narrow QRS, normal axis, largely normal intervals besides slight PR prolongation, nonspecific ST changes.  ____________________________________________    RADIOLOGY  CT angiography negative for PE but shows acute right lower lobe pneumonia  ____________________________________________   INITIAL IMPRESSION / ASSESSMENT AND PLAN / ED COURSE  Pertinent labs & imaging results that were available during my care of the patient were reviewed by me and considered in my medical decision making (see chart for details).  Patient presents to the emergency department after a near syncopal versus syncopal episode.  Differential is quite broad but would include ACS, electrolyte abnormality, metabolic abnormality, dehydration, infectious etiology.  We will check labs, IV hydrate and continue to closely monitor.  CT positive for acute right lower lobe pneumonia.  Given the patient's syncopal episode at home with diffuse weakness, as well as borderline temperature tachypnea we will  admit to the hospital service for IV antibiotics.  ____________________________________________   FINAL CLINICAL IMPRESSION(S) / ED DIAGNOSES  Syncope Pneumonia   Harvest Dark, MD 03/31/18 1227

## 2018-03-31 NOTE — ED Notes (Signed)
Report received from stephanie - pt had syncopal episode per wife while at side of bed and was helped back down. Pt alert and oriented while here with no complaints

## 2018-03-31 NOTE — ED Triage Notes (Signed)
Pt from home via ems with reports of not feeling well for about 2 days with fever. Pt was sitting on side of bed this am and had a syncopal episode and slid down to his buttocks. Denies head injury, does take eliquis. Initial BP was 90/50 upon ems arrival. Pt denies pain.

## 2018-03-31 NOTE — ED Notes (Signed)
Pt c/o cough for several months but worse in the past couple of days with loss of appetite. Wife states when he got up to the bedside this morning he passed out and she helped to ease him back on the bed to prevent him from falling. States he was out maybe 5 seconds. Pt is a/o on arrival. States his cough worsened in the past couple of days and yesterday was running a fever of 101.3.Marland Kitchen

## 2018-03-31 NOTE — Progress Notes (Addendum)
Patient refusing bed alarm despite education x2. Patient and wife very adamant about keeping it off and demanding for someone to assist patient as soon as possible when needing to use the bathroom.Nurse tech relayed information that the patient's wife told her the same thing. Educated both patient and wife why the bed alarm should be on and we would try to do our best to come in and assist but to be mindful sometimes we are in other rooms as well. Will continue to educate and continue to monitor patient.     Update: patient's wife requesting to give patient's night time medications. This RN went over the medications he would be getting tonight. Tried to explain what medication and what dose he was getting, patient's wife stating we are changing his medications. Explained we were not so I would call pharmacy to make sure he was getting the right doses. Pharmacy contacted MD for correct Namenda dosing. Now patient's wife is upset because we do not carry omeprazole and does not believe me that we do not carry that medication. Explained that it would be substituted with pantoprazole while in the hospital. Charge nurse notified of the situation, will speak with patient's wife. Will continue to monitor patient.

## 2018-03-31 NOTE — ED Notes (Signed)
Report given.

## 2018-03-31 NOTE — Progress Notes (Signed)
*  PRELIMINARY RESULTS* Echocardiogram 2D Echocardiogram has been performed.  Elijah Jackson, Elijah Jackson 03/31/2018, 3:27 PM

## 2018-04-01 DIAGNOSIS — J189 Pneumonia, unspecified organism: Secondary | ICD-10-CM | POA: Diagnosis not present

## 2018-04-01 DIAGNOSIS — R55 Syncope and collapse: Secondary | ICD-10-CM | POA: Diagnosis not present

## 2018-04-01 LAB — CBC
HCT: 37 % — ABNORMAL LOW (ref 39.0–52.0)
HEMOGLOBIN: 12.5 g/dL — AB (ref 13.0–17.0)
MCH: 31.9 pg (ref 26.0–34.0)
MCHC: 33.8 g/dL (ref 30.0–36.0)
MCV: 94.4 fL (ref 80.0–100.0)
NRBC: 0 % (ref 0.0–0.2)
Platelets: 146 10*3/uL — ABNORMAL LOW (ref 150–400)
RBC: 3.92 MIL/uL — AB (ref 4.22–5.81)
RDW: 13.2 % (ref 11.5–15.5)
WBC: 6.2 10*3/uL (ref 4.0–10.5)

## 2018-04-01 LAB — BASIC METABOLIC PANEL
ANION GAP: 7 (ref 5–15)
BUN: 17 mg/dL (ref 8–23)
CO2: 21 mmol/L — ABNORMAL LOW (ref 22–32)
Calcium: 8.4 mg/dL — ABNORMAL LOW (ref 8.9–10.3)
Chloride: 110 mmol/L (ref 98–111)
Creatinine, Ser: 1.24 mg/dL (ref 0.61–1.24)
GFR calc non Af Amer: 54 mL/min — ABNORMAL LOW (ref >60)
Glucose, Bld: 102 mg/dL — ABNORMAL HIGH (ref 70–99)
POTASSIUM: 3.4 mmol/L — AB (ref 3.5–5.1)
Sodium: 138 mmol/L (ref 135–145)

## 2018-04-01 LAB — MAGNESIUM: Magnesium: 2.2 mg/dL (ref 1.7–2.4)

## 2018-04-01 LAB — GLUCOSE, CAPILLARY: Glucose-Capillary: 106 mg/dL — ABNORMAL HIGH (ref 70–99)

## 2018-04-01 MED ORDER — CEFDINIR 300 MG PO CAPS: 300.0000 mg | ORAL_CAPSULE | Freq: Two times a day (BID) | ORAL | 0 refills | Status: DC

## 2018-04-01 MED ORDER — DOCUSATE SODIUM 100 MG PO CAPS: 100.0000 mg | ORAL_CAPSULE | Freq: Two times a day (BID) | ORAL | Status: DC

## 2018-04-01 MED ORDER — LATANOPROST 0.005 % OP SOLN: 1.0000 [drp] | Freq: Every day | OPHTHALMIC | Status: DC

## 2018-04-01 MED ORDER — AZITHROMYCIN 250 MG PO TABS: 250.0000 mg | ORAL_TABLET | Freq: Every day | ORAL | 0 refills | Status: DC

## 2018-04-01 MED ORDER — POTASSIUM CHLORIDE 20 MEQ PO PACK
40.0000 meq | PACK | Freq: Once | ORAL | Status: AC
  Administered 2018-04-01: 40 meq via ORAL
  Filled 2018-04-01: qty 2

## 2018-04-01 NOTE — Progress Notes (Signed)
This RN discussed the medications and what ime the pt took them with the pt and his wife. She is adamant that they stay on the schedule that they are on at home due to some specific instructions given by their PCP on the intervals between medications, specifically the Prevalite which she states must be taken at 1200 and the other meds must be taken at 0600 and 1800. MD made aware and pharmacy notified. Med times changed to 0600 and 1800 and the Prevalite changed to 1200 per pt request. Pt educated on the Protonix and, per pharmacy, given the option to bring her home Prilosec if that was more agreeable. Pt has decided the Protonix will be sufficient and an order was placed for that.

## 2018-04-01 NOTE — Progress Notes (Signed)
Pt to be discharged to home today. Iv and tele removed. disch instructions and prescrips given to pt and wife. disch via w.c..

## 2018-04-01 NOTE — Evaluation (Signed)
Physical Therapy Evaluation Patient Details Name: Elijah Corriher Sr. MRN: [id number] DOB: June 23, 1934 Today's Date: 04/01/2018   History of Present Illness  Patient is an 83 year old male admitted for syncope and collapse and R side pneumonia.  PMH includes HLD, Htn, myasthenia gravis and GERD.  Clinical Impression  Patient is an 83 year old male who lives in a one story home with his wife.  He is independent and very active at baseline.  Pt independent with bed mobility, transfers and able to walk without AD demonstrating no LOB's.  He demonstrated good strength of UE's/Le's and reported no N/T.  Pt presented with good static and dynamic balance with only mild deficits with very narrow stance activity.  Appropriate education provided and pt expressed understanding.  Pt is not appropriate for continued skilled PT at this time and order will be discharged.  Please consult is a need arises in the future.    Follow Up Recommendations No PT follow up    Equipment Recommendations  None recommended by PT    Recommendations for Other Services       Precautions / Restrictions Precautions Precautions: Fall Restrictions Weight Bearing Restrictions: No      Mobility  Bed Mobility Overal bed mobility: Independent                Transfers Overall transfer level: Independent                  Ambulation/Gait Ambulation/Gait assistance: Supervision Gait Distance (Feet): 50 Feet Assistive device: None Gait Pattern/deviations: Step-through pattern   Gait velocity interpretation: >2.62 ft/sec, indicative of community ambulatory General Gait Details: Good foot clearance, step length, no LOB's  Stairs            Wheelchair Mobility    Modified Rankin (Stroke Patients Only)       Balance Overall balance assessment: Needs assistance;Independent   Sitting balance-Leahy Scale: Normal       Standing balance-Leahy Scale: Good Standing balance comment: Quiet stance:  15 sec, ft together: 15 sec, EC: 15 sec mildly increased a-p sway, tandem: L: 5 sec, R: 5 sec. tandem walk: 2x10 ft without UE assist                             Pertinent Vitals/Pain Pain Assessment: No/denies pain    Home Living Family/patient expects to be discharged to:: Private residence Living Arrangements: Spouse/significant other Available Help at Discharge: Family;Available 24 hours/day Type of Home: House Home Access: Stairs to enter Entrance Stairs-Rails: Can reach both Entrance Stairs-Number of Steps: 3 Home Layout: One level Home Equipment: Walker - 2 wheels;Cane - single point      Prior Function Level of Independence: Independent         Comments: Pt stated that he has used an AD in the past due to feelings of "instability".  He is very active with day to day activity.     Hand Dominance        Extremity/Trunk Assessment   Upper Extremity Assessment Upper Extremity Assessment: Overall WFL for tasks assessed    Lower Extremity Assessment Lower Extremity Assessment: Overall WFL for tasks assessed(BLE:Ankle PF/DF: 5/5, knee flex/ext: 5/5; no sensation loss reported.)    Cervical / Trunk Assessment Cervical / Trunk Assessment: Normal  Communication   Communication: No difficulties  Cognition Arousal/Alertness: Awake/alert Behavior During Therapy: Restless Overall Cognitive Status: Within Functional Limits for tasks assessed  General Comments      Exercises Other Exercises Other Exercises: Monitored BP and noted pt mildly hypotensive when initially standing.  Discussed management of fall prevention related to orthostatic hypotension.  x4 min Other Exercises: Discussed home balance exercises and importance of maintaining dynamic balance ability with active lifestyle. x4 min   Assessment/Plan    PT Assessment Patent does not need any further PT services  PT Problem List          PT Treatment Interventions      PT Goals (Current goals can be found in the Care Plan section)  Acute Rehab PT Goals PT Goal Formulation: All assessment and education complete, DC therapy    Frequency     Barriers to discharge        Co-evaluation               AM-PAC PT "6 Clicks" Mobility  Outcome Measure Help needed turning from your back to your side while in a flat bed without using bedrails?: None Help needed moving from lying on your back to sitting on the side of a flat bed without using bedrails?: None Help needed moving to and from a bed to a chair (including a wheelchair)?: None Help needed standing up from a chair using your arms (e.g., wheelchair or bedside chair)?: None Help needed to walk in hospital room?: None Help needed climbing 3-5 steps with a railing? : None 6 Click Score: 24    End of Session Equipment Utilized During Treatment: Gait belt Activity Tolerance: Patient tolerated treatment well Patient left: in bed;with call bell/phone within reach;with bed alarm set Nurse Communication: Mobility status PT Visit Diagnosis: History of falling (Z91.81)    Time: 4166-0630 PT Time Calculation (min) (ACUTE ONLY): 17 min   Charges:   PT Evaluation $PT Eval Low Complexity: 1 Low PT Treatments $Therapeutic Activity: 8-22 mins        Roxanne Gates, PT, DPT   Roxanne Gates 04/01/2018, 12:01 PM

## 2018-04-01 NOTE — Discharge Summary (Signed)
[facility] NAME: Kvion Shapley    MR#:  [id number]  DATE OF BIRTH:  01-18-1935  DATE OF ADMISSION:  03/31/2018 ADMITTING PHYSICIAN: Epifanio Lesches, MD  DATE OF DISCHARGE: No discharge date for patient encounter.  PRIMARY CARE PHYSICIAN: Olin Hauser, DO    ADMISSION DIAGNOSIS:  Syncope [R55] Weakness [R53.1] Syncope, unspecified syncope type [R55] Community acquired pneumonia of right lower lobe of lung ([place]) [J18.1]  DISCHARGE DIAGNOSIS:  Active Problems:   Syncope   SECONDARY DIAGNOSIS:   Past Medical History:  Diagnosis Date  . Dyslipidemia   . GERD (gastroesophageal reflux disease)   . Glaucoma   . Hyperlipidemia   . Hypertension   . Insomnia   . Myasthenia gravis ([place]) 09/08/2012  . Obesity   . Ocular myasthenia gravis ([place])   . Prostate cancer [facility])     HOSPITAL COURSE:   83 year old male patient with history of PE on Eliquis comes in because of worsening shortness of breath, cough, syncope.  *Acute syncope likely secondary to dehydration, acute kidney injury, community-acquired pneumonia Solved with IV fluids for rehydration Echocardiogram noted for stage I diastolic dysfunction, carotid Dopplers negative for any hemodynamically significant stenosis   *Acute CAP/right-sided  Noted on CT  Resolving  Treated on our pneumonia protocol   *acute kidney injury likely due to ATN from hypotension Resolved with IV fluids for rehydration   DISCHARGE CONDITIONS:   stable  CONSULTS OBTAINED:    DRUG ALLERGIES:  No Known Allergies  DISCHARGE MEDICATIONS:   Allergies as of 04/01/2018   No Known Allergies     Medication List    TAKE these medications   acetaminophen 325 MG tablet Commonly known as:  TYLENOL Take 1 tablet (325 mg total) by mouth every 6 (six) hours as needed for mild pain (or Fever >/= 101).   apixaban 5 MG Tabs tablet Commonly known as:   ELIQUIS Take 1 tablet (5 mg total) by mouth 2 (two) times daily.   atorvastatin 10 MG tablet Commonly known as:  LIPITOR Take 10 mg by mouth daily.   azithromycin 250 MG tablet Commonly known as:  ZITHROMAX Take 1 tablet (250 mg total) by mouth daily. Start taking on:  April 02, 2018   cefdinir 300 MG capsule Commonly known as:  OMNICEF Take 1 capsule (300 mg total) by mouth 2 (two) times daily.   cholestyramine light 4 GM/DOSE powder Commonly known as:  PREVALITE Take 8 g by mouth daily. Mix in 6 ounces of non-carbonated beverage   cyclobenzaprine 10 MG tablet Commonly known as:  FLEXERIL Take 10 mg by mouth at bedtime.   feeding supplement (ENSURE ENLIVE) Liqd Take 237 mLs by mouth 2 (two) times daily between meals.   latanoprost 0.005 % ophthalmic solution Commonly known as:  XALATAN Place 1 drop into both eyes at bedtime.   lisinopril 40 MG tablet Commonly known as:  PRINIVIL,ZESTRIL Take 0.5 tablets (20 mg total) by mouth daily. What changed:  how much to take   memantine 10 MG tablet Commonly known as:  NAMENDA Take 10 mg by mouth See admin instructions. Take one-half tablet at bedtime for 2 weeks, then take one tablet at bedtime for 2 weeks, then take one and one-half tablets at bedtime for 2 weeks, then take two tablets at bedtime to slow memory loss   omeprazole 20 MG capsule Commonly known as:  PRILOSEC Take 2 capsules (40 mg total) by mouth 2 (two)  times daily before a meal.   ondansetron 4 MG disintegrating tablet Commonly known as:  ZOFRAN ODT Take 1 tablet (4 mg total) by mouth every 8 (eight) hours as needed for nausea or vomiting.   predniSONE 5 MG tablet Commonly known as:  DELTASONE Take 5 mg by mouth daily.   senna-docusate 8.6-50 MG tablet Commonly known as:  Senokot-S Take 1 tablet by mouth at bedtime as needed for mild constipation.   zolpidem 5 MG tablet Commonly known as:  AMBIEN Take 5 mg by mouth at bedtime as needed for sleep.         DISCHARGE INSTRUCTIONS:    If you experience worsening of your admission symptoms, develop shortness of breath, life threatening emergency, suicidal or homicidal thoughts you must seek medical attention immediately by calling 911 or calling your MD immediately  if symptoms less severe.  You Must read complete instructions/literature along with all the possible adverse reactions/side effects for all the Medicines you take and that have been prescribed to you. Take any new Medicines after you have completely understood and accept all the possible adverse reactions/side effects.   Please note  You were cared for by a hospitalist during your hospital stay. If you have any questions about your discharge medications or the care you received while you were in the hospital after you are discharged, you can call the unit and asked to speak with the hospitalist on call if the hospitalist that took care of you is not available. Once you are discharged, your primary care physician will handle any further medical issues. Please note that NO REFILLS for any discharge medications will be authorized once you are discharged, as it is imperative that you return to your primary care physician (or establish a relationship with a primary care physician if you do not have one) for your aftercare needs so that they can reassess your need for medications and monitor your lab values.    Today   CHIEF COMPLAINT:   Chief Complaint  Patient presents with  . Near Syncope    HISTORY OF PRESENT ILLNESS:   83 y.o. male with a known history of hyperlipidemia, hypertension, myasthenia gravis, GERD brought in because of syncope.  Patient passed out this morning, slid down to buttocks, lost consciousness for about couple of seconds.    Patient noted to have hypotension BP of 90/50 when EMS arrived.  Has been having some cough for 6 months as per wife, patient was mated in October for pneumonia and discharged with  antibiotics and the wife took him to: Because of shortness of breath and found to have PE.  Patient is on Eliquis for that.  Has been having some cough since last discharge and then syncopized today.  Wife is wondering if he has pneumonia.  Patient CT angios chest negative for acute PE but showed right lower lobe pneumonia.  And has been running temperature up to 101.3 Fahrenheit for the past couple of days.  VITAL SIGNS:  Blood pressure 134/61, pulse 77, temperature 98.7 F (37.1 C), temperature source Oral, resp. rate 20, height 5\' 9"  (1.753 m), weight 82.7 kg, SpO2 93 %.  I/O:    Intake/Output Summary (Last 24 hours) at 04/01/2018 1207 Last data filed at 04/01/2018 0920 Gross per 24 hour  Intake 98.83 ml  Output 1149 ml  Net -1050.17 ml    PHYSICAL EXAMINATION:  GENERAL:  83 y.o.-year-old patient lying in the bed with no acute distress.  EYES: Pupils equal, round,  reactive to light and accommodation. No scleral icterus. Extraocular muscles intact.  HEENT: Head atraumatic, normocephalic. Oropharynx and nasopharynx clear.  NECK:  Supple, no jugular venous distention. No thyroid enlargement, no tenderness.  LUNGS: Normal breath sounds bilaterally, no wheezing, rales,rhonchi or crepitation. No use of accessory muscles of respiration.  CARDIOVASCULAR: S1, S2 normal. No murmurs, rubs, or gallops.  ABDOMEN: Soft, non-tender, non-distended. Bowel sounds present. No organomegaly or mass.  EXTREMITIES: No pedal edema, cyanosis, or clubbing.  NEUROLOGIC: Cranial nerves II through XII are intact. Muscle strength 5/5 in all extremities. Sensation intact. Gait not checked.  PSYCHIATRIC: The patient is alert and oriented x 3.  SKIN: No obvious rash, lesion, or ulcer.   DATA REVIEW:   CBC Recent Labs  Lab 04/01/18 0549  WBC 6.2  HGB 12.5*  HCT 37.0*  PLT 146*    Chemistries  Recent Labs  Lab 03/31/18 0933 04/01/18 0549  NA 134* 138  K 3.7 3.4*  CL 106 110  CO2 21* 21*  GLUCOSE  123* 102*  BUN 22 17  CREATININE 1.47* 1.24  CALCIUM 8.4* 8.4*  MG  --  2.2  AST 18  --   ALT 13  --   ALKPHOS 46  --   BILITOT 1.2  --     Cardiac Enzymes Recent Labs  Lab 03/31/18 0933  TROPONINI 0.03*    Microbiology Results  Results for orders placed or performed during the hospital encounter of 03/31/18  Blood culture (routine x 2)     Status: None (Preliminary result)   Collection Time: 03/31/18  1:26 PM  Result Value Ref Range Status   Specimen Description BLOOD BLOOD LEFT FOREARM  Final   Special Requests   Final    BOTTLES DRAWN AEROBIC AND ANAEROBIC Blood Culture results may not be optimal due to an inadequate volume of blood received in culture bottles   Culture   Final    NO GROWTH < 24 HOURS Performed at [facility], [address], [place], [place] [address]    Report Status PENDING  Incomplete  Blood culture (routine x 2)     Status: None (Preliminary result)   Collection Time: 03/31/18  1:26 PM  Result Value Ref Range Status   Specimen Description BLOOD LEFT ANTECUBITAL  Final   Special Requests   Final    BOTTLES DRAWN AEROBIC AND ANAEROBIC Blood Culture results may not be optimal due to an inadequate volume of blood received in culture bottles   Culture   Final    NO GROWTH < 24 HOURS Performed at [facility], [address] [place],  [address]    Report Status PENDING  Incomplete    RADIOLOGY:  Dg Chest 2 View  Result Date: 03/31/2018 CLINICAL DATA:  Patient not feeling well.  Fever. EXAM: CHEST - 2 VIEW COMPARISON:  January 12, 2017 FINDINGS: Opacity in the lateral right lung base is much smaller compared October 2018. Scarring in the lingula, unchanged. No other new infiltrate. No nodules or masses. No pneumothorax. The cardiomediastinal silhouette is unremarkable. IMPRESSION: There is opacity in the lateral right lung base. The opacity is actually smaller when compared October 2018. Whether this represents residual  scarring at the site of previous opacity or recurrent infiltrate cannot be determined on this single study. Recommend clinical correlation and attention on follow-up. Electronically Signed   By: Dorise Bullion III M.D   On: 03/31/2018 10:59   Ct Angio Chest Pe W And/or Wo Contrast  Result Date: 03/31/2018 CLINICAL DATA:  Cough for several months. Syncope. Fever. Concern for pulmonary embolism. EXAM: CT ANGIOGRAPHY CHEST WITH CONTRAST TECHNIQUE: Multidetector CT imaging of the chest was performed using the standard protocol during bolus administration of intravenous contrast. Multiplanar CT image reconstructions and MIPs were obtained to evaluate the vascular anatomy. CONTRAST:  21mL ISOVUE-370 IOPAMIDOL (ISOVUE-370) INJECTION 76% COMPARISON:  None. FINDINGS: Cardiovascular: No filling defects within the pulmonary arteries to suggest acute pulmonary embolism. No acute findings of the aorta or great vessels. No pericardial fluid. Mediastinum/Nodes: No axillary supraclavicular adenopathy. No mediastinal hilar adenopathy. No pericardial effusion. Esophagus normal. Subcarinal lymph node is upper limits of normal at 10 mm short axis (image 47/4. Lungs/Pleura: Within the RIGHT lower lobe peribronchial airspace consolidation with air bronchograms is measuring approximately 3.8 x 3.4 cm (image 64/6). No discrete measurable nodularity. LEFT lung clear. Upper Abdomen: Limited view of the liver, kidneys, pancreas are unremarkable. Normal adrenal glands. Musculoskeletal: No aggressive osseous lesion. Review of the MIP images confirms the above findings. IMPRESSION: 1. No evidence acute pulmonary embolism. 2. RIGHT lower lobe pneumonia. Recommend follow-up imaging to ensure resolution and exclude underlying neoplasm (CT in 6 to 8 weeks). 3. Borderline enlarged subcarinal lymph node is likely reactive. Electronically Signed   By: Suzy Bouchard M.D.   On: 03/31/2018 12:18   US Carotid Bilateral  Result Date:  03/31/2018 CLINICAL DATA:  83 year old male with syncope EXAM: BILATERAL CAROTID DUPLEX ULTRASOUND TECHNIQUE: Pearline Cables scale imaging, color Doppler and duplex ultrasound were performed of bilateral carotid and vertebral arteries in the neck. COMPARISON:  None. FINDINGS: Criteria: Quantification of carotid stenosis is based on velocity parameters that correlate the residual internal carotid diameter with NASCET-based stenosis levels, using the diameter of the distal internal carotid lumen as the denominator for stenosis measurement. The following velocity measurements were obtained: RIGHT ICA: 103/16 cm/sec CCA: 161/09 cm/sec SYSTOLIC ICA/CCA RATIO:  0.9 ECA:  148 cm/sec LEFT ICA: 101/23 cm/sec CCA: 604/54 cm/sec SYSTOLIC ICA/CCA RATIO:  1.0 ECA:  103 cm/sec RIGHT CAROTID ARTERY: Mild focal heterogeneous atherosclerotic plaque in the proximal internal carotid artery. By peak systolic velocity criteria, the estimated stenosis remains less than 50%. RIGHT VERTEBRAL ARTERY:  Patent with normal antegrade flow. LEFT CAROTID ARTERY: Mild focal heterogeneous atherosclerotic plaque in the proximal internal carotid artery. By peak systolic velocity criteria, the estimated stenosis remains less than 50%. LEFT VERTEBRAL ARTERY:  Patent with normal antegrade flow. IMPRESSION: 1. Mild (1-49%) stenosis proximal right internal carotid artery secondary to focal heterogeneous atherosclerotic plaque. 2. Mild (1-49%) stenosis proximal left internal carotid artery secondary to mild focal heterogeneous atherosclerotic plaque. 3. Vertebral arteries are patent with normal antegrade flow. Signed, Criselda Peaches, MD, [facility] Radiology Electronically Signed   By: Jacqulynn Cadet M.D.   On: 03/31/2018 15:58    EKG:   Orders placed or performed during the hospital encounter of 03/31/18  . ED EKG  . ED EKG  . EKG 12-Lead  . EKG 12-Lead      Management plans discussed  with the patient, family and they are in agreement.  CODE STATUS:     Code Status Orders  (From admission, onward)         Start     Ordered   03/31/18 1305  Full code  Continuous     03/31/18 1307        Code Status History    Date Active Date Inactive Code Status Order ID Comments  User Context   01/12/2017 2227 01/14/2017 1431 Full Code [id number]  Phillips Grout, MD Inpatient   01/08/2017 1201 01/10/2017 1626 Full Code [id number]  Demetrios Loll, MD ED   04/14/2016 2155 04/17/2016 1804 Full Code [id number]  Ivor Costa, MD ED   04/02/2016 1529 04/03/2016 1621 Full Code [id number]  Demetrios Loll, MD Inpatient    Advance Directive Documentation     Most Recent Value  Type of Advance Directive  Living will  Pre-existing out of facility DNR order (yellow form or pink MOST form)  -  "MOST" Form in Place?  -      TOTAL TIME TAKING CARE OF THIS PATIENT: 40 minutes.    Avel Peace  M.D on 04/01/2018 at 12:07 PM  Between 7am to 6pm - Pager - [phone]  After 6pm go to [email or web address] - password [email or web address] [facility]  Office  [phone]  CC: Primary care physician; Olin Hauser, DO   Note: This dictation was prepared with Dragon dictation along with smaller phrase technology. Any transcriptional errors that result from this process are unintentional.

## 2018-04-04 ENCOUNTER — Ambulatory Visit (INDEPENDENT_AMBULATORY_CARE_PROVIDER_SITE_OTHER): Payer: Medicare Other | Admitting: Family Medicine

## 2018-04-04 ENCOUNTER — Encounter: Payer: Self-pay | Admitting: Family Medicine

## 2018-04-04 VITALS — BP 123/55 | HR 66 | Temp 98.3°F | Resp 16 | Ht 69.0 in | Wt 190.0 lb

## 2018-04-04 DIAGNOSIS — J181 Lobar pneumonia, unspecified organism: Secondary | ICD-10-CM | POA: Diagnosis not present

## 2018-04-04 DIAGNOSIS — J189 Pneumonia, unspecified organism: Secondary | ICD-10-CM

## 2018-04-04 MED ORDER — CEFDINIR 300 MG PO CAPS: 300.0000 mg | ORAL_CAPSULE | Freq: Two times a day (BID) | ORAL | 0 refills | Status: DC

## 2018-04-04 NOTE — Patient Instructions (Addendum)
Thank you for coming to the office today.  Discussed pneumonia treatment today - we agreed to extend the [facility] antibiotic one capsule twice a day for 7 more days, up to 14 days total  Finish Azithromycin, this is only limited to a 5 day total course, you have 2 pills left. We cannot repeat this one.  Based on CT imaging of lung - it is recommended to repeat a CT image in 6 to 8 weeks  If worsening cough, weakness, short of breath, chest pain, fever or chills - or cannot take oral antibiotics any more, then notify office and we can SWITCH to Levaquin antibiotic daily for 7 days, and you would STOP current Omnicef antibiotic.  Call us in 6 weeks even if feeling better, and we can repeat the CT scan and let you know how it appears, if there is any concern we will refer you back to [facility]  Please schedule a Follow-up Appointment to: Return in about 6 weeks (around 05/16/2018) for follow-up pneumonia CT image.  If you have any other questions or concerns, please feel free to call the office or send a message through [place]. You may also schedule an earlier appointment if necessary.  Additionally, you may be receiving a survey about your experience at our office within a few days to 1 week by e-mail or mail. We value your feedback.  Nobie Putnam, DO [place]

## 2018-04-04 NOTE — Progress Notes (Signed)
Subjective:    Patient ID: Elijah Ponder Sr., male    DOB: 1934/11/12, 83 y.o.   MRN: [id number]  Elijah Longmore Sr. is a 83 y.o. male presenting on 04/04/2018 for Hospitalization Follow-up (Community acquired pneumonia of right lower lobe of lung, Syncope, weakness)  Accompanied by wife, Crystal, who provides additional history.  HPI  HOSPITAL FOLLOW-UP VISIT  Hospital/Location: [facility] Date of Admission: 03/31/18 Date of Discharge: 04/01/18 Transitions of care telephone call: Not completed  Reason for Admission: Dyspnea, Syncope Primary (+Secondary) Diagnosis: RLL Pneumonia (CAP), AKI, Syncope  FOLLOW-UP  - Hospital H&P and Discharge Summary have been reviewed - Patient presents today about 3 days after recent hospitalization. Brief summary of recent course, patient had symptoms of recent weakness, dyspnea, and syncopal episode - he had imaging w/ CT Chest showed focal RLL PNA, could not rule out malignancy or other etiology due to image, found to be dehydrated and AKI due to ATN from hypotension, AKi resolved w/ IVF rehydration. Given Ceftriaxone IV x 1 dose. Then transitioned to PO oral antibiotics w/ Azithromycin Z-pak and Omnicef PO.  - Today reports overall has done well after discharge. Symptoms of weakness have not resolved but fevers have resolved, his dyspnea has improved.  - He has 2 more days of Azithromycin and 3-4 more days of Omnicef - He and his wife are concerned that still not back to baseline, still has some residual weakness, non focal Denies fevers, chills, nausea vomiting chest pain, productive cough, worsening dyspnea, recurrent syncope   Depression screen [facility] 2/9 04/04/2018 09/07/2016  Decreased Interest 0 0  Down, Depressed, Hopeless 0 0  PHQ - 2 Score 0 0    Social History   Tobacco Use  . Smoking status: Former Smoker    Last attempt to quit: 04/06/1970    Years since quitting: 48.0  . Smokeless tobacco: Former Network engineer Use Topics  . Alcohol  use: Yes    Alcohol/week: 0.0 standard drinks    Comment: Consumes alcohol on occasion  . Drug use: No    Review of Systems Per HPI unless specifically indicated above     Objective:    BP (!) 123/55   Pulse 66   Temp 98.3 F (36.8 C) (Oral)   Resp 16   Ht 5\' 9"  (1.753 m)   Wt 190 lb (86.2 kg)   SpO2 97%   BMI 28.06 kg/m   Wt Readings from Last 3 Encounters:  04/04/18 190 lb (86.2 kg)  04/01/18 182 lb 6.4 oz (82.7 kg)  11/30/17 197 lb 6.4 oz (89.5 kg)    Physical Exam Vitals signs and nursing note reviewed.  Constitutional:      General: He is not in acute distress.    Appearance: He is well-developed. He is not diaphoretic.     Comments: Well-appearing, comfortable, cooperative  HENT:     Head: Normocephalic and atraumatic.  Eyes:     General:        Right eye: No discharge.        Left eye: No discharge.     Conjunctiva/sclera: Conjunctivae normal.  Neck:     Musculoskeletal: Normal range of motion and neck supple.     Thyroid: No thyromegaly.  Cardiovascular:     Rate and Rhythm: Normal rate and regular rhythm.     Heart sounds: Normal heart sounds. No murmur.  Pulmonary:     Effort: Pulmonary effort is normal. No respiratory distress.     Breath sounds:  Normal breath sounds. No wheezing or rales.     Comments: Good air movement. Speaks full sentences. Musculoskeletal: Normal range of motion.  Lymphadenopathy:     Cervical: No cervical adenopathy.  Skin:    General: Skin is warm and dry.     Findings: No erythema or rash.  Neurological:     Mental Status: He is alert and oriented to person, place, and time.  Psychiatric:        Behavior: Behavior normal.     Comments: Well groomed, good eye contact, normal speech and thoughts      I have personally reviewed the radiology report from 03/31/18 CT Chest Angio.  CLINICAL DATA:  Cough for several months. Syncope. Fever. Concern for pulmonary embolism.  EXAM: CT ANGIOGRAPHY CHEST WITH  CONTRAST  TECHNIQUE: Multidetector CT imaging of the chest was performed using the standard protocol during bolus administration of intravenous contrast. Multiplanar CT image reconstructions and MIPs were obtained to evaluate the vascular anatomy.  CONTRAST:  30mL ISOVUE-370 IOPAMIDOL (ISOVUE-370) INJECTION 76%  COMPARISON:  None.  FINDINGS: Cardiovascular: No filling defects within the pulmonary arteries to suggest acute pulmonary embolism. No acute findings of the aorta or great vessels. No pericardial fluid.  Mediastinum/Nodes: No axillary supraclavicular adenopathy. No mediastinal hilar adenopathy. No pericardial effusion. Esophagus normal. Subcarinal lymph node is upper limits of normal at 10 mm short axis (image 47/4.  Lungs/Pleura: Within the RIGHT lower lobe peribronchial airspace consolidation with air bronchograms is measuring approximately 3.8 x 3.4 cm (image 64/6). No discrete measurable nodularity. LEFT lung clear.  Upper Abdomen: Limited view of the liver, kidneys, pancreas are unremarkable. Normal adrenal glands.  Musculoskeletal: No aggressive osseous lesion.  Review of the MIP images confirms the above findings.  IMPRESSION: 1. No evidence acute pulmonary embolism. 2. RIGHT lower lobe pneumonia. Recommend follow-up imaging to ensure resolution and exclude underlying neoplasm (CT in 6 to 8 weeks). 3. Borderline enlarged subcarinal lymph node is likely reactive.   Electronically Signed   By: Suzy Bouchard M.D.   On: 03/31/2018 12:18  Results for orders placed or performed during the hospital encounter of 03/31/18  Blood culture (routine x 2)  Result Value Ref Range   Specimen Description BLOOD BLOOD LEFT FOREARM    Special Requests      BOTTLES DRAWN AEROBIC AND ANAEROBIC Blood Culture results may not be optimal due to an inadequate volume of blood received in culture bottles   Culture      NO GROWTH 4 DAYS Performed at [facility], [address] [place], [place] [address]    Report Status PENDING   Blood culture (routine x 2)  Result Value Ref Range   Specimen Description BLOOD LEFT ANTECUBITAL    Special Requests      BOTTLES DRAWN AEROBIC AND ANAEROBIC Blood Culture results may not be optimal due to an inadequate volume of blood received in culture bottles   Culture      NO GROWTH 4 DAYS Performed at [facility], [place], [place] [address]    Report Status PENDING   CBC  Result Value Ref Range   WBC 9.1 4.0 - 10.5 K/uL   RBC 4.17 (L) 4.22 - 5.81 MIL/uL   Hemoglobin 13.1 13.0 - 17.0 g/dL   HCT 38.5 (L) 39.0 - 52.0 %   MCV 92.3 80.0 - 100.0 fL   MCH 31.4 26.0 - 34.0 pg   MCHC 34.0 30.0 - 36.0 g/dL   RDW  13.1 11.5 - 15.5 %   Platelets 149 (L) 150 - 400 K/uL   nRBC 0.0 0.0 - 0.2 %  Comprehensive metabolic panel  Result Value Ref Range   Sodium 134 (L) 135 - 145 mmol/L   Potassium 3.7 3.5 - 5.1 mmol/L   Chloride 106 98 - 111 mmol/L   CO2 21 (L) 22 - 32 mmol/L   Glucose, Bld 123 (H) 70 - 99 mg/dL   BUN 22 8 - 23 mg/dL   Creatinine, Ser 1.47 (H) 0.61 - 1.24 mg/dL   Calcium 8.4 (L) 8.9 - 10.3 mg/dL   Total Protein 6.4 (L) 6.5 - 8.1 g/dL   Albumin 3.4 (L) 3.5 - 5.0 g/dL   AST 18 15 - 41 U/L   ALT 13 0 - 44 U/L   Alkaline Phosphatase 46 38 - 126 U/L   Total Bilirubin 1.2 0.3 - 1.2 mg/dL   GFR calc non Af Amer 44 (L) >60 mL/min   GFR calc Af Amer 51 (L) >60 mL/min   Anion gap 7 5 - 15  CK  Result Value Ref Range   Total CK 32 (L) 49 - 397 U/L  Urinalysis, Routine w reflex microscopic  Result Value Ref Range   Color, Urine YELLOW (A) YELLOW   APPearance CLEAR (A) CLEAR   Specific Gravity, Urine 1.044 (H) 1.005 - 1.030   pH 5.0 5.0 - 8.0   Glucose, UA NEGATIVE NEGATIVE mg/dL   Hgb urine dipstick MODERATE (A) NEGATIVE   Bilirubin Urine NEGATIVE NEGATIVE   Ketones, ur 20 (A) NEGATIVE mg/dL   Protein, ur NEGATIVE NEGATIVE mg/dL   Nitrite NEGATIVE NEGATIVE    Leukocytes, UA NEGATIVE NEGATIVE   RBC / HPF 0-5 0 - 5 RBC/hpf   WBC, UA 0-5 0 - 5 WBC/hpf   Bacteria, UA NONE SEEN NONE SEEN   Squamous Epithelial / LPF NONE SEEN 0 - 5   Mucus PRESENT   Troponin I - ONCE - STAT  Result Value Ref Range   Troponin I 0.03 (HH) <0.03 ng/mL  Influenza panel by PCR (type A & B)  Result Value Ref Range   Influenza A By PCR NEGATIVE NEGATIVE   Influenza B By PCR NEGATIVE NEGATIVE  Basic metabolic panel  Result Value Ref Range   Sodium 138 135 - 145 mmol/L   Potassium 3.4 (L) 3.5 - 5.1 mmol/L   Chloride 110 98 - 111 mmol/L   CO2 21 (L) 22 - 32 mmol/L   Glucose, Bld 102 (H) 70 - 99 mg/dL   BUN 17 8 - 23 mg/dL   Creatinine, Ser 1.24 0.61 - 1.24 mg/dL   Calcium 8.4 (L) 8.9 - 10.3 mg/dL   GFR calc non Af Amer 54 (L) >60 mL/min   GFR calc Af Amer >60 >60 mL/min   Anion gap 7 5 - 15  CBC  Result Value Ref Range   WBC 6.2 4.0 - 10.5 K/uL   RBC 3.92 (L) 4.22 - 5.81 MIL/uL   Hemoglobin 12.5 (L) 13.0 - 17.0 g/dL   HCT 37.0 (L) 39.0 - 52.0 %   MCV 94.4 80.0 - 100.0 fL   MCH 31.9 26.0 - 34.0 pg   MCHC 33.8 30.0 - 36.0 g/dL   RDW 13.2 11.5 - 15.5 %   Platelets 146 (L) 150 - 400 K/uL   nRBC 0.0 0.0 - 0.2 %  Glucose, capillary  Result Value Ref Range   Glucose-Capillary 106 (H) 70 - 99 mg/dL  Magnesium  Result Value Ref Range   Magnesium 2.2 1.7 - 2.4 mg/dL  ECHOCARDIOGRAM COMPLETE  Result Value Ref Range   Weight 3,006.4 oz   Height 69 in   BP 112/58 mmHg      Assessment & Plan:   Problem List Items Addressed This Visit    None    Visit Diagnoses    Pneumonia of right lower lobe due to infectious organism [facility])    -  Primary   Relevant Medications   cefdinir (OMNICEF) 300 MG capsule      Clinically with significantly improved RLL CAP, and other sequela of dehydration, AKI hypotension w/ syncope s/p resolved w/ IVF rehydration. - CT imaging 12/26 cannot rule out other etiology such as malignancy - hemodynamically stable and well appearing  today -Pulse Ox 97% on room air, afebrile  Plan: Discussion on guidelines for CAP treatment, he was treated already w/ Ceftriaxone IV and then transitioned to PO dual coverage antibiotics - advised that this is appropriate, given his significant clinical improvement. Patient and wife not comfortable and requested extension of antibiotics, I agreed to extend [facility] by 7 days - and advised may risk inc antibiotic resistance if ultimately not needed. Advised would avoid additional azithromycin. Strict follow-up criteria given if not improve or worsening - next step would be discontinue course and switch to FQ Levaquin monotherapy likely 750mg  daily x 5-7 days for focal PNA - Additionally based on radiology recs - would agree w/ repeat CT Chest in 6-8 weeks to follow-up resolution of focal RLL consolidation, rule out possible malignancy   Meds ordered this encounter  Medications  . cefdinir (OMNICEF) 300 MG capsule    Sig: Take 1 capsule (300 mg total) by mouth 2 (two) times daily. Extend for 7 days    Dispense:  14 capsule    Refill:  0      Follow up plan: Return in about 6 weeks (around 05/16/2018) for follow-up pneumonia CT image.  Nobie Putnam, [facility] 04/04/2018, 3:10 PM

## 2018-04-05 LAB — CULTURE, BLOOD (ROUTINE X 2)
Culture: NO GROWTH
Culture: NO GROWTH

## 2018-04-11 ENCOUNTER — Telehealth: Payer: Self-pay | Admitting: Family Medicine

## 2018-04-11 NOTE — Telephone Encounter (Signed)
Pt. wife called requesting that you call about a scan gave her

## 2018-04-13 NOTE — Telephone Encounter (Signed)
Patient was seen by me on 04/04/18.  He was treated with extended antibiotics for Right Lower Lobe Pneumonia.  He was asked to have a repeat Chest CT scan in 6 to 8 weeks from last scan which was on 03/31/18.  Please notify patient that it is too early to check a repeat CT Scan to follow-up on the pneumonia to make sure it resolves. The earliest date will be February 6 or the following 1-2 weeks in February.  She should call us around the first week of February and we will place order and schedule him for a repeat CT Scan.  Additionally - if he is not feeling any better, or any worsening symptoms of pneumonia, worse short of breath, return of fevers, short of breath, - then they need to notify us of that as well so we can re-evaluate him or discuss treatment options.  Nobie Putnam, [facility] 04/13/2018, 8:52 AM

## 2018-04-13 NOTE — Telephone Encounter (Signed)
Advised spouse she will call us back around 1st week of February and patient Sxs are still same advised as per Dr. Raliegh Ip.

## 2018-04-19 NOTE — Telephone Encounter (Signed)
Opened in error by Leafy Ro

## 2018-09-07 ENCOUNTER — Ambulatory Visit (INDEPENDENT_AMBULATORY_CARE_PROVIDER_SITE_OTHER): Payer: Medicare Other | Admitting: Family Medicine

## 2018-09-07 ENCOUNTER — Encounter: Payer: Self-pay | Admitting: Family Medicine

## 2018-09-07 ENCOUNTER — Ambulatory Visit: Payer: Medicare Other | Admitting: Family Medicine

## 2018-09-07 ENCOUNTER — Other Ambulatory Visit: Payer: Self-pay

## 2018-09-07 VITALS — BP 134/55 | HR 56 | Temp 98.9°F | Ht 69.0 in | Wt 198.0 lb

## 2018-09-07 DIAGNOSIS — R252 Cramp and spasm: Secondary | ICD-10-CM | POA: Diagnosis not present

## 2018-09-07 DIAGNOSIS — L853 Xerosis cutis: Secondary | ICD-10-CM | POA: Diagnosis not present

## 2018-09-07 DIAGNOSIS — D2371 Other benign neoplasm of skin of right lower limb, including hip: Secondary | ICD-10-CM

## 2018-09-07 NOTE — Patient Instructions (Addendum)
Thank you for coming to the office today.  Leg cramps - Try spoonful of yellow mustard to relieve leg cramps or try daily to prevent the problem  - OTC natural option is Hyland's Leg Cramps (Dissolving tablet) take as needed for muscle cramps  - Rehydr8 liquid as needed for cramping  Stay hydrated, keep on the G2 as needed  Lower leg is likely a Dermatofibroma - or scar tissue from some sort of injury or bite, could be tick, but less likely to be lyme, we could run an antibody blood test some day if you want to know if you ever had lyme or not.  Follow up with Dermatology for recommendation on topical lotion or cream for the dry skin on the arm.  Please schedule a Follow-up Appointment to: Return if symptoms worsen or fail to improve, for cramp.  If you have any other questions or concerns, please feel free to call the office or send a message through [place]. You may also schedule an earlier appointment if necessary.  Additionally, you may be receiving a survey about your experience at our office within a few days to 1 week by e-mail or mail. We value your feedback.  Nobie Putnam, DO [place]

## 2018-09-07 NOTE — Progress Notes (Signed)
Subjective:    Patient ID: [name], male    DOB: 1934-09-19, 84 y.o.   MRN: [id number]  Mikko Lewellen Sr. is a 84 y.o. male presenting on 09/07/2018 for Melanoma; Insect Bite (on the back of the Rt calf x 11 years. Itching. Pt concern of possible lymes disease ); and Leg Pain (intermittent bilateral cramps in the calves x 2 weeks )  History from patient and wife, Crystal.  HPI   Dermatofibroma vs history of scar vs bite on leg Reports one location back calf of R leg, has a circular abnormal discoloration of skin, also with some dry flaking skin, he thought had a tick but he never saw it, never had extending rash or anything else, but has had this one area localized for >10-11 years by his report, no significant change or pain, no drainage or other issue. - He was told by a retired doctor that it was lyme's disease, and he should get it evaluated. He is asking about this today, asking if it is. He does not endorse any symptoms of lyme disease, or problem with this area years ago.  Leg Cramping, lower extremity Reports episodic leg cramps, usually R > L, calf both legs often can be at night usually or wake up with cramp, eating more bananas with some relief. Not tried any other treatment. Drinks mostly water and some G2 gatorade  Dry skin dermatitis / History of pre-cancerous skin lesions History of cryotherapy for AKs and other pre-cancer skin lesion in past, usually saw [facility], has had issue with chronic sun exposure and thin skin and some dry flaking skin, he has not been back to dermatology recently. He has easy bleeding with thin skin at times, and has some scabs. He says skin on his arms itches usually and is dry, tried most topical options without good results. He is asking for something stronger or better relief of dry itchy skin. He plans to return to Dermatology  Denies any fevers chills, redness of skin or drainage of pus, swelling, nausea vomiting   Depression  screen [facility] 2/9 04/04/2018 09/07/2016  Decreased Interest 0 0  Down, Depressed, Hopeless 0 0  PHQ - 2 Score 0 0    Social History   Tobacco Use  . Smoking status: Former Smoker    Last attempt to quit: 04/06/1970    Years since quitting: 48.4  . Smokeless tobacco: Former Network engineer Use Topics  . Alcohol use: Yes    Alcohol/week: 0.0 standard drinks    Comment: Consumes alcohol on occasion  . Drug use: No    Review of Systems Per HPI unless specifically indicated above     Objective:    BP (!) 134/55 (BP Location: Right Arm, Patient Position: Sitting, Cuff Size: Normal)   Pulse (!) 56   Temp 98.9 F (37.2 C) (Oral)   Ht 5\' 9"  (1.753 m)   Wt 198 lb (89.8 kg)   BMI 29.24 kg/m   Wt Readings from Last 3 Encounters:  09/07/18 198 lb (89.8 kg)  04/04/18 190 lb (86.2 kg)  04/01/18 182 lb 6.4 oz (82.7 kg)    Physical Exam Vitals signs and nursing note reviewed.  Constitutional:      General: He is not in acute distress.    Appearance: He is well-developed. He is not diaphoretic.     Comments: Well-appearing, comfortable, cooperative  HENT:     Head: Normocephalic and atraumatic.  Eyes:     General:  Right eye: No discharge.        Left eye: No discharge.     Conjunctiva/sclera: Conjunctivae normal.  Cardiovascular:     Rate and Rhythm: Normal rate.  Pulmonary:     Effort: Pulmonary effort is normal.  Skin:    General: Skin is warm and dry.     Findings: Lesion (Right lower leg, calf with about 1.5 to 2 cm circular discoloration of skin with palpable slightly fibrous nature, has dry flaking skin on top. non tender, no induration.) present. No erythema or rash.     Comments: Significant dry thin skin on forearms and hands, few scabs, thin appearing skin. Some areas of scar tissue from prior cryotherapy.  Neurological:     Mental Status: He is alert and oriented to person, place, and time.  Psychiatric:        Behavior: Behavior normal.     Comments: Well  groomed, good eye contact, normal speech and thoughts    Results for orders placed or performed during the hospital encounter of 03/31/18  Blood culture (routine x 2)  Result Value Ref Range   Specimen Description BLOOD BLOOD LEFT FOREARM    Special Requests      BOTTLES DRAWN AEROBIC AND ANAEROBIC Blood Culture results may not be optimal due to an inadequate volume of blood received in culture bottles   Culture      NO GROWTH 5 DAYS Performed at [facility], [address] [place], [place] [address]    Report Status 04/05/2018 FINAL   Blood culture (routine x 2)  Result Value Ref Range   Specimen Description BLOOD LEFT ANTECUBITAL    Special Requests      BOTTLES DRAWN AEROBIC AND ANAEROBIC Blood Culture results may not be optimal due to an inadequate volume of blood received in culture bottles   Culture      NO GROWTH 5 DAYS Performed at [facility], [address] [place], [place] [address]    Report Status 04/05/2018 FINAL   CBC  Result Value Ref Range   WBC 9.1 4.0 - 10.5 K/uL   RBC 4.17 (L) 4.22 - 5.81 MIL/uL   Hemoglobin 13.1 13.0 - 17.0 g/dL   HCT 38.5 (L) 39.0 - 52.0 %   MCV 92.3 80.0 - 100.0 fL   MCH 31.4 26.0 - 34.0 pg   MCHC 34.0 30.0 - 36.0 g/dL   RDW 13.1 11.5 - 15.5 %   Platelets 149 (L) 150 - 400 K/uL   nRBC 0.0 0.0 - 0.2 %  Comprehensive metabolic panel  Result Value Ref Range   Sodium 134 (L) 135 - 145 mmol/L   Potassium 3.7 3.5 - 5.1 mmol/L   Chloride 106 98 - 111 mmol/L   CO2 21 (L) 22 - 32 mmol/L   Glucose, Bld 123 (H) 70 - 99 mg/dL   BUN 22 8 - 23 mg/dL   Creatinine, Ser 1.47 (H) 0.61 - 1.24 mg/dL   Calcium 8.4 (L) 8.9 - 10.3 mg/dL   Total Protein 6.4 (L) 6.5 - 8.1 g/dL   Albumin 3.4 (L) 3.5 - 5.0 g/dL   AST 18 15 - 41 U/L   ALT 13 0 - 44 U/L   Alkaline Phosphatase 46 38 - 126 U/L   Total Bilirubin 1.2 0.3 - 1.2 mg/dL   GFR calc non Af Amer 44 (L) >60 mL/min   GFR calc Af Amer 51 (L) >60 mL/min   Anion gap 7 5 - 15  CK  Result Value Ref Range   Total CK 32 (L) 49 - 397 U/L  Urinalysis, Routine w reflex microscopic  Result Value Ref Range   Color, Urine YELLOW (A) YELLOW   APPearance CLEAR (A) CLEAR   Specific Gravity, Urine 1.044 (H) 1.005 - 1.030   pH 5.0 5.0 - 8.0   Glucose, UA NEGATIVE NEGATIVE mg/dL   Hgb urine dipstick MODERATE (A) NEGATIVE   Bilirubin Urine NEGATIVE NEGATIVE   Ketones, ur 20 (A) NEGATIVE mg/dL   Protein, ur NEGATIVE NEGATIVE mg/dL   Nitrite NEGATIVE NEGATIVE   Leukocytes, UA NEGATIVE NEGATIVE   RBC / HPF 0-5 0 - 5 RBC/hpf   WBC, UA 0-5 0 - 5 WBC/hpf   Bacteria, UA NONE SEEN NONE SEEN   Squamous Epithelial / LPF NONE SEEN 0 - 5   Mucus PRESENT   Troponin I - ONCE - STAT  Result Value Ref Range   Troponin I 0.03 (HH) <0.03 ng/mL  Influenza panel by PCR (type A & B)  Result Value Ref Range   Influenza A By PCR NEGATIVE NEGATIVE   Influenza B By PCR NEGATIVE NEGATIVE  Basic metabolic panel  Result Value Ref Range   Sodium 138 135 - 145 mmol/L   Potassium 3.4 (L) 3.5 - 5.1 mmol/L   Chloride 110 98 - 111 mmol/L   CO2 21 (L) 22 - 32 mmol/L   Glucose, Bld 102 (H) 70 - 99 mg/dL   BUN 17 8 - 23 mg/dL   Creatinine, Ser 1.24 0.61 - 1.24 mg/dL   Calcium 8.4 (L) 8.9 - 10.3 mg/dL   GFR calc non Af Amer 54 (L) >60 mL/min   GFR calc Af Amer >60 >60 mL/min   Anion gap 7 5 - 15  CBC  Result Value Ref Range   WBC 6.2 4.0 - 10.5 K/uL   RBC 3.92 (L) 4.22 - 5.81 MIL/uL   Hemoglobin 12.5 (L) 13.0 - 17.0 g/dL   HCT 37.0 (L) 39.0 - 52.0 %   MCV 94.4 80.0 - 100.0 fL   MCH 31.9 26.0 - 34.0 pg   MCHC 33.8 30.0 - 36.0 g/dL   RDW 13.2 11.5 - 15.5 %   Platelets 146 (L) 150 - 400 K/uL   nRBC 0.0 0.0 - 0.2 %  Glucose, capillary  Result Value Ref Range   Glucose-Capillary 106 (H) 70 - 99 mg/dL  Magnesium  Result Value Ref Range   Magnesium 2.2 1.7 - 2.4 mg/dL  ECHOCARDIOGRAM COMPLETE  Result Value Ref Range   Weight 3,006.4 oz   Height 69 in   BP 112/58 mmHg      Assessment  & Plan:   Problem List Items Addressed This Visit    None    Visit Diagnoses    Dermatofibroma of calf, right    -  Primary Asymptomatic Reassurance, likely diagnosis benign lesion from scar tissue residual from prior bite most likely Not consistent with lyme's by history Offered antibody lyme testing to learn more to help rule out - he declines, not interested Follow-up as need    Muscle cramping     Likely secondary to hydration and electrolytes and activity level, seems typical nocturnal muscle cramp Last lab mild low K but normal mag  Try yellow mustard tablespoon nightly to prevent or when has cramp acutely Can try hyland's leg cramp per AVS recommendations Improve hydration     Dry skin dermatitis     Clinically with extensive areas of sun  damaged skin on forearms, prior pre-cancerous AK areas s/p cryotherapy from year ago, with thin skin - No acute abnormality - Trial of existing moisturizers is ineffective - Asked him to contact his dermatologist or return to derm for further advanced skin care formulations or recommendations from them, I am not aware of any other rx option for him.        No orders of the defined types were placed in this encounter.    Follow up plan: Return if symptoms worsen or fail to improve, for cramp.   Nobie Putnam, DO [facility] 09/07/2018, 11:30 AM

## 2018-11-08 ENCOUNTER — Encounter: Payer: Self-pay | Admitting: Podiatry

## 2018-11-08 ENCOUNTER — Ambulatory Visit (INDEPENDENT_AMBULATORY_CARE_PROVIDER_SITE_OTHER): Payer: Medicare Other

## 2018-11-08 ENCOUNTER — Ambulatory Visit (INDEPENDENT_AMBULATORY_CARE_PROVIDER_SITE_OTHER): Payer: Medicare Other | Admitting: Podiatry

## 2018-11-08 ENCOUNTER — Other Ambulatory Visit: Payer: Self-pay | Admitting: Podiatry

## 2018-11-08 ENCOUNTER — Other Ambulatory Visit: Payer: Self-pay

## 2018-11-08 DIAGNOSIS — G629 Polyneuropathy, unspecified: Secondary | ICD-10-CM

## 2018-11-08 DIAGNOSIS — G609 Hereditary and idiopathic neuropathy, unspecified: Secondary | ICD-10-CM

## 2018-11-08 MED ORDER — MELOXICAM 15 MG PO TABS: 15.0000 mg | ORAL_TABLET | Freq: Every day | ORAL | 1 refills | Status: DC

## 2018-11-08 MED ORDER — METHYLPREDNISOLONE 4 MG PO TBPK: ORAL_TABLET | ORAL | 0 refills | Status: DC

## 2018-11-10 NOTE — Progress Notes (Signed)
   HPI: 84 y.o. male presenting today as a new patient with a chief complaint of a burning sensation noted to the plantar aspects of the bilateral feet and toes that began about three weeks ago. He reports associated swelling of the legs. He has not done anything for treatment. There are no modifying factors noted. Patient is here for further evaluation and treatment.   Past Medical History:  Diagnosis Date  . Dyslipidemia   . GERD (gastroesophageal reflux disease)   . Glaucoma   . Hyperlipidemia   . Hypertension   . Insomnia   . Myasthenia gravis ([place]) 09/08/2012  . Obesity   . Ocular myasthenia gravis ([place])   . Prostate cancer [facility])      Physical Exam: General: The patient is alert and oriented x3 in no acute distress.  Dermatology: Skin is warm, dry and supple bilateral lower extremities. Negative for open lesions or macerations.  Vascular: Palpable pedal pulses bilaterally. No edema or erythema noted. Capillary refill within normal limits.  Neurological: Epicritic and protective threshold diminished bilaterally.   Musculoskeletal Exam: Range of motion within normal limits to all pedal and ankle joints bilateral. Muscle strength 5/5 in all groups bilateral.   Radiographic Exam:  Normal osseous mineralization. Joint spaces preserved. No fracture/dislocation/boney destruction.    Assessment: 1. Idiopathic peripheral neuropathy BLE secondary to fall three weeks ago   Plan of Care:  1. Patient evaluated. X-Rays reviewed.  2. Prescription for Medrol Dose Pak provided to patient. 3. Prescription for Meloxicam provided to patient. 4. Recommended good shoe gear.  5. Return to clinic in 4 weeks.   Talked Agricultural consultant with him. He may know a guy selling one.       Edrick Kins, DPM [facility]  Dr. Edrick Kins, DPM    [address],  [address]                Office [phone]  Fax [phone]

## 2018-11-11 ENCOUNTER — Telehealth: Payer: Self-pay

## 2018-11-11 NOTE — Telephone Encounter (Signed)
FYI

## 2018-11-11 NOTE — Telephone Encounter (Signed)
-----   Message from Arlyce Dice sent at 11/11/2018 11:08 AM EDT ----- Patient Wife called in to state that her husband already currently takes Prednisone for another medical condition and does not want him to take the Prednisone that was prescribed by Dr.Evans. She wanted a message to be sent stating this, and that they will discuss other medication methods with Dr.Evans at next scheduled visit.

## 2018-12-06 ENCOUNTER — Ambulatory Visit: Payer: Medicare Other | Admitting: Podiatry

## 2019-01-11 ENCOUNTER — Encounter: Payer: Self-pay | Admitting: Emergency Medicine

## 2019-01-11 ENCOUNTER — Other Ambulatory Visit: Payer: Self-pay

## 2019-01-11 ENCOUNTER — Emergency Department: Payer: Worker's Compensation

## 2019-01-11 ENCOUNTER — Emergency Department
Admission: EM | Admit: 2019-01-11 | Discharge: 2019-01-11 | Disposition: A | Discharge status: Home / Self Care | Payer: Worker's Compensation | Attending: Emergency Medicine | Admitting: Emergency Medicine

## 2019-01-11 DIAGNOSIS — Y929 Unspecified place or not applicable: Secondary | ICD-10-CM | POA: Insufficient documentation

## 2019-01-11 DIAGNOSIS — W25XXXA Contact with sharp glass, initial encounter: Secondary | ICD-10-CM | POA: Diagnosis not present

## 2019-01-11 DIAGNOSIS — S59911A Unspecified injury of right forearm, initial encounter: Secondary | ICD-10-CM | POA: Diagnosis present

## 2019-01-11 DIAGNOSIS — Z23 Encounter for immunization: Secondary | ICD-10-CM | POA: Insufficient documentation

## 2019-01-11 DIAGNOSIS — S41111A Laceration without foreign body of right upper arm, initial encounter: Secondary | ICD-10-CM

## 2019-01-11 DIAGNOSIS — S6421XA Injury of radial nerve at wrist and hand level of right arm, initial encounter: Secondary | ICD-10-CM | POA: Insufficient documentation

## 2019-01-11 DIAGNOSIS — G7000 Myasthenia gravis without (acute) exacerbation: Secondary | ICD-10-CM | POA: Insufficient documentation

## 2019-01-11 DIAGNOSIS — Z79899 Other long term (current) drug therapy: Secondary | ICD-10-CM | POA: Insufficient documentation

## 2019-01-11 DIAGNOSIS — W19XXXA Unspecified fall, initial encounter: Secondary | ICD-10-CM

## 2019-01-11 DIAGNOSIS — Z7901 Long term (current) use of anticoagulants: Secondary | ICD-10-CM | POA: Diagnosis not present

## 2019-01-11 DIAGNOSIS — Z87891 Personal history of nicotine dependence: Secondary | ICD-10-CM | POA: Diagnosis not present

## 2019-01-11 DIAGNOSIS — S51811A Laceration without foreign body of right forearm, initial encounter: Secondary | ICD-10-CM | POA: Insufficient documentation

## 2019-01-11 DIAGNOSIS — Y999 Unspecified external cause status: Secondary | ICD-10-CM | POA: Diagnosis not present

## 2019-01-11 DIAGNOSIS — I10 Essential (primary) hypertension: Secondary | ICD-10-CM | POA: Diagnosis not present

## 2019-01-11 DIAGNOSIS — W134XXA Fall from, out of or through window, initial encounter: Secondary | ICD-10-CM | POA: Insufficient documentation

## 2019-01-11 DIAGNOSIS — Y939 Activity, unspecified: Secondary | ICD-10-CM | POA: Diagnosis not present

## 2019-01-11 DIAGNOSIS — S41122A Laceration with foreign body of left upper arm, initial encounter: Secondary | ICD-10-CM | POA: Insufficient documentation

## 2019-01-11 DIAGNOSIS — S5421XA Injury of radial nerve at forearm level, right arm, initial encounter: Secondary | ICD-10-CM

## 2019-01-11 LAB — CBC
HCT: 41.6 % (ref 39.0–52.0)
Hemoglobin: 14.1 g/dL (ref 13.0–17.0)
MCH: 32.8 pg (ref 26.0–34.0)
MCHC: 33.9 g/dL (ref 30.0–36.0)
MCV: 96.7 fL (ref 80.0–100.0)
Platelets: 194 10*3/uL (ref 150–400)
RBC: 4.3 MIL/uL (ref 4.22–5.81)
RDW: 13.1 % (ref 11.5–15.5)
WBC: 6.7 10*3/uL (ref 4.0–10.5)
nRBC: 0 % (ref 0.0–0.2)

## 2019-01-11 LAB — BASIC METABOLIC PANEL
Anion gap: 8 (ref 5–15)
BUN: 23 mg/dL (ref 8–23)
CO2: 22 mmol/L (ref 22–32)
Calcium: 8.5 mg/dL — ABNORMAL LOW (ref 8.9–10.3)
Chloride: 110 mmol/L (ref 98–111)
Creatinine, Ser: 1.29 mg/dL — ABNORMAL HIGH (ref 0.61–1.24)
GFR calc Af Amer: 59 mL/min — ABNORMAL LOW (ref >60)
GFR calc non Af Amer: 51 mL/min — ABNORMAL LOW (ref >60)
Glucose, Bld: 154 mg/dL — ABNORMAL HIGH (ref 70–99)
Potassium: 4.5 mmol/L (ref 3.5–5.1)
Sodium: 140 mmol/L (ref 135–145)

## 2019-01-11 MED ORDER — ONDANSETRON 4 MG PO TBDP
4.0000 mg | ORAL_TABLET | Freq: Once | ORAL | Status: AC
  Administered 2019-01-11: 4 mg via ORAL
  Filled 2019-01-11: qty 1

## 2019-01-11 MED ORDER — TETANUS-DIPHTHERIA TOXOIDS TD 5-2 LFU IM INJ
0.5000 mL | INJECTION | Freq: Once | INTRAMUSCULAR | Status: AC
  Administered 2019-01-11: 17:00:00 0.5 mL via INTRAMUSCULAR
  Filled 2019-01-11: qty 0.5

## 2019-01-11 MED ORDER — LIDOCAINE HCL (PF) 1 % IJ SOLN
INTRAMUSCULAR | Status: AC
  Filled 2019-01-11: qty 5

## 2019-01-11 MED ORDER — LIDOCAINE HCL (PF) 1 % IJ SOLN
INTRAMUSCULAR | Status: AC
  Administered 2019-01-11: 20 mL
  Filled 2019-01-11: qty 15

## 2019-01-11 MED ORDER — LIDOCAINE HCL (PF) 1 % IJ SOLN
20.0000 mL | Freq: Once | INTRAMUSCULAR | Status: AC
  Administered 2019-01-11: 16:00:00 20 mL

## 2019-01-11 MED ORDER — ONDANSETRON HCL 4 MG/2ML IJ SOLN: 4.0000 mg | Freq: Once | INTRAMUSCULAR | Status: DC

## 2019-01-11 MED ORDER — CEPHALEXIN 500 MG PO CAPS
500.0000 mg | ORAL_CAPSULE | Freq: Once | ORAL | Status: AC
  Administered 2019-01-11: 17:00:00 500 mg via ORAL
  Filled 2019-01-11: qty 1

## 2019-01-11 MED ORDER — OXYCODONE-ACETAMINOPHEN 5-325 MG PO TABS
1.0000 | ORAL_TABLET | ORAL | Status: DC | PRN
  Administered 2019-01-11: 1 via ORAL
  Filled 2019-01-11: qty 1

## 2019-01-11 MED ORDER — CEPHALEXIN 250 MG PO CAPS: 250.0000 mg | ORAL_CAPSULE | Freq: Two times a day (BID) | ORAL | 0 refills | Status: AC

## 2019-01-11 NOTE — ED Notes (Signed)
First nurse note: Pt here via EMS with c/o walking into glass window at [employer] today, lacs to right hand and left forearm wrapped by EMS, bleeding controlled at this time. States he is on blood thinner. Pt sitting in lobby chair, NAD.

## 2019-01-11 NOTE — Discharge Instructions (Signed)
Please take antibiotic as prescribed.  Please follow-up with orthopedics at 9 AM tomorrow morning.  Return to the emergency department for any symptom personally concerning to yourself.

## 2019-01-11 NOTE — ED Notes (Signed)
Pt refusing wheel chair out

## 2019-01-11 NOTE — ED Notes (Signed)
MD at bedside suturing

## 2019-01-11 NOTE — ED Provider Notes (Addendum)
[facility] Emergency Department Provider Note  Time seen: 3:19 PM  I have reviewed the triage vital signs and the nursing notes.   HISTORY  Chief Complaint Fall and Extremity Laceration   HPI Elijah Finner Sr. is a 84 y.o. male with a past medical history of gastric reflux, hypertension, hyperlipidemia, myasthenia gravis, presents to the emergency department after a fall through glass.  According to the patient he has myasthenia gravis and will go through episodes of weakness.  States he has been feeling well however he was trying to get to the door and somehow ran into a plate glass window falling through the window.  Patient had lacerations to his right and left upper extremities.  Patient takes Eliquis for anticoagulation and states significant bleeding.  His wounds were wrapped by EMS and they were brought to the emergency department for evaluation.  Denies any weakness currently.  Denies any fever cough or shortness of breath.  Denies any recent chest pain abdominal pain.   Past Medical History:  Diagnosis Date  . Dyslipidemia   . GERD (gastroesophageal reflux disease)   . Glaucoma   . Hyperlipidemia   . Hypertension   . Insomnia   . Myasthenia gravis ([place]) 09/08/2012  . Obesity   . Ocular myasthenia gravis ([place])   . Prostate cancer [facility])     Patient Active Problem List   Diagnosis Date Noted  . Syncope 03/31/2018  . Left-sided headache 01/25/2017  . OSA (obstructive sleep apnea) 12/24/2016  . Diarrhea 12/24/2016  . Squamous cell carcinoma 09/07/2016  . Cavitating mass of lung 04/17/2016  . Gastritis 04/17/2016  . Recurrent pulmonary emboli ([place]) 04/15/2016  . Essential hypertension 04/14/2016  . HLD (hyperlipidemia) 04/14/2016  . GERD (gastroesophageal reflux disease) 04/14/2016  . Hypokalemia 04/14/2016  . Weakness 04/14/2016  . Postcholecystectomy diarrhea 09/14/2013  . Dizziness and giddiness 08/02/2013  . Chronic cholecystitis 01/25/2013   . Myasthenia gravis ([place]) 09/08/2012    Past Surgical History:  Procedure Laterality Date  . CATARACT EXTRACTION Bilateral   . CHOLECYSTECTOMY    . TRANSURETHRAL RESECTION OF PROSTATE      Prior to Admission medications   Medication Sig Start Date End Date Taking? Authorizing Provider  acetaminophen (TYLENOL) 325 MG tablet Take 1 tablet (325 mg total) by mouth every 6 (six) hours as needed for mild pain (or Fever >/= 101). 01/10/17   Nicholes Mango, MD  apixaban (ELIQUIS) 5 MG TABS tablet Take 1 tablet (5 mg total) by mouth 2 (two) times daily. 02/24/17   Karamalegos, Devonne Doughty, DO  atorvastatin (LIPITOR) 10 MG tablet Take 10 mg by mouth daily.    [provider]  cyclobenzaprine (FLEXERIL) 10 MG tablet Take 10 mg by mouth at bedtime.    [provider]  feeding supplement, ENSURE ENLIVE, (ENSURE ENLIVE) LIQD Take 237 mLs by mouth 2 (two) times daily between meals. 01/10/17   Gouru, Illene Silver, MD  latanoprost (XALATAN) 0.005 % ophthalmic solution Place 1 drop into both eyes at bedtime.    [provider]  lisinopril (PRINIVIL,ZESTRIL) 40 MG tablet Take 0.5 tablets (20 mg total) by mouth daily. Patient taking differently: Take 40 mg by mouth daily.  01/10/17   Nicholes Mango, MD  meloxicam (MOBIC) 15 MG tablet Take 1 tablet (15 mg total) by mouth daily. 11/08/18   Edrick Kins, DPM  memantine (NAMENDA) 10 MG tablet Take 10 mg by mouth See admin instructions. Take one-half tablet at bedtime for 2 weeks, then take  one tablet at bedtime for 2 weeks, then take one and one-half tablets at bedtime for 2 weeks, then take two tablets at bedtime to slow memory loss    [provider]  methylPREDNISolone (MEDROL DOSEPAK) 4 MG TBPK tablet 6 day dose pack - take as directed 11/08/18   Edrick Kins, DPM  omeprazole (PRILOSEC) 20 MG capsule Take 2 capsules (40 mg total) by mouth 2 (two) times daily before a meal. 04/17/16   Debbe Odea, MD  ondansetron (ZOFRAN ODT) 4 MG  disintegrating tablet Take 1 tablet (4 mg total) by mouth every 8 (eight) hours as needed for nausea or vomiting. 12/24/16   Parks Ranger, Devonne Doughty, DO  predniSONE (DELTASONE) 5 MG tablet Take 5 mg by mouth daily. 05/15/14   [provider]  senna-docusate (SENOKOT-S) 8.6-50 MG tablet Take 1 tablet by mouth at bedtime as needed for mild constipation. 01/10/17   Gouru, Illene Silver, MD  zolpidem (AMBIEN) 5 MG tablet Take 5 mg by mouth at bedtime as needed for sleep.    [provider]    No Known Allergies  Family History  Problem Relation Age of Onset  . Heart attack Father     Social History Social History   Tobacco Use  . Smoking status: Former Smoker    Quit date: 04/06/1970    Years since quitting: 48.8  . Smokeless tobacco: Former Network engineer Use Topics  . Alcohol use: Yes    Alcohol/week: 0.0 standard drinks    Comment: Consumes alcohol on occasion  . Drug use: No    Review of Systems Constitutional: Negative for fever Cardiovascular: Negative for chest pain. Respiratory: Negative for shortness of breath. Gastrointestinal: Negative for abdominal pain Musculoskeletal: Patient unable to move right thumb Skin: Lacerations to right left upper extremity.  Laceration left upper extremity. Neurological: Negative for headache All other ROS negative  ____________________________________________   PHYSICAL EXAM:  VITAL SIGNS: ED Triage Vitals  Enc Vitals Group     BP 01/11/19 1400 (!) 173/67     Pulse Rate 01/11/19 1400 89     Resp 01/11/19 1400 16     Temp 01/11/19 1400 99 F (37.2 C)     Temp Source 01/11/19 1400 Oral     SpO2 01/11/19 1400 95 %     Weight 01/11/19 1401 195 lb (88.5 kg)     Height 01/11/19 1401 5\' 8"  (1.727 m)     Head Circumference --      Peak Flow --      Pain Score 01/11/19 1401 9     Pain Loc --      Pain Edu? --      Excl. in [place]? --    Constitutional: Alert and oriented. Well appearing and in no distress. Eyes: Normal  exam ENT      Head: Normocephalic and atraumatic.      Mouth/Throat: Mucous membranes are moist. Cardiovascular: Normal rate, regular rhythm. Respiratory: Normal respiratory effort without tachypnea nor retractions. Breath sounds are clear  Gastrointestinal: Soft and nontender. No distention.   Musculoskeletal: Patient has an approximate 5 cm laceration just above the left elbow.  Hemostatic.  Neurovascular intact distally.  Patient has approximate 2.5 cm laceration to the distal right forearm overlying the radial bone.  Appears to be neurovascular intact past this laceration.  Has good extension in the second and third digits with normal sensation of the second third digits.  Patient then has a more distal laceration overlying the dorsal  aspect of the first metacarpal on the right hand.  Patient is unable to extend his first digit and has no sensation on the dorsal aspect of the first digit distal to the laceration.  When he attempts to move the thumb there is no apparent muscle body movement. Neurologic:  Normal speech and language. No gross focal neurologic deficits  Skin:  Skin is warm, dry and intact.    ____________________________________________   RADIOLOGY  X-ray shows subtle transverse lucency across the radial aspect of the scaphoid concerning for possible mild nondisplaced fracture  Multiple glass shards in left upper extremity lacerations  ____________________________________________   INITIAL IMPRESSION / ASSESSMENT AND PLAN / ED COURSE  Pertinent labs & imaging results that were available during my care of the patient were reviewed by me and considered in my medical decision making (see chart for details).   Patient presents emergency department after falling through a glass window.  Patient has a history of myasthenia gravis and states he will often become weak.  Currently the patient appears well.  No fever, cough or shortness of breath.  Differential would include  myasthenia gravis exacerbation, electrolyte or metabolic abnormality, anemia.  On examination patient has 2 lacerations over the dorsal aspect of the right upper extremity 1 just proximal to the wrist and the other just distal to the wrist overlying the dorsal aspect of the first metacarpal.  Patient has good flexion in the thumb however he is not able to extend the thumb.  I do not see any obvious muscle body movement.  Differential would include superficial radial nerve injury versus tendon injury.  Given the decreased sensation of the dorsal aspect of the right thumb I highly suspect nerve injury possibly in addition to a tendon injury.  I reviewed the patient's x-rays.  No fractures evident.  Patient does appear to have a possible foreign body/glass in the left upper extremity laceration.  We washout and attempt to remove any foreign bodies.  I discussed the patient with orthopedics.  They state washout the lacerations very well and repair, and have the patient follow-up with Dr. Peggye Ley at [facility].    Laceration was repaired by myself.  2 small chunks of glass were removed from the left upper extremity laceration.  All lacerations were extensively washed out and explored for any foreign bodies.  Lacerations were repaired.  Patient will follow-up at 9 AM tomorrow with orthopedics.  We will update the patient's tetanus and put him on Keflex as a precaution.  X-ray does show possible nondisplaced scaphoid fracture on the right wrist.  Given this overlies the patient's laceration we will not place in a splint as the patient will likely require an operation tomorrow to surgically repair the radial nerve.  Will be seen by orthopedics at 9 AM tomorrow.  We will defer further treatment to orthopedics.  Elijah Paczkowski Sr. was evaluated in Emergency Department on 01/11/2019 for the symptoms described in the history of present illness. He was evaluated in the context of the global COVID-19 pandemic, which  necessitated consideration that the patient might be at risk for infection with the SARS-CoV-2 virus that causes COVID-19. Institutional protocols and algorithms that pertain to the evaluation of patients at risk for COVID-19 are in a state of rapid change based on information released by regulatory bodies including the CDC and federal and state organizations. These policies and algorithms were followed during the patient's care in the ED.  LACERATION REPAIR Performed by: Harvest Dark Authorized by:  Harvest Dark Consent: Verbal consent obtained. Risks and benefits: risks, benefits and alternatives were discussed Consent given by: patient Patient identity confirmed: provided demographic data Prepped and Draped in normal sterile fashion Wound explored  Laceration Location: Left upper extremity  Laceration Length: 5 cm  No Foreign Bodies seen or palpated  Anesthesia: local infiltration  Local anesthetic: lidocaine 1% without epinephrine  Anesthetic total: 10 ml  Irrigation method: syringe Amount of cleaning: standard  Deep sutures: 2 rapid vircyl 4-0 Skin closure: 6 superficial 5-0 Prolene sutures.  Technique: Simple interrupted  Patient tolerance: Patient tolerated the procedure well with no immediate complications.  LACERATION REPAIR 2 Performed by: Harvest Dark Authorized by: Harvest Dark Consent: Verbal consent obtained. Risks and benefits: risks, benefits and alternatives were discussed Consent given by: patient Patient identity confirmed: provided demographic data Prepped and Draped in normal sterile fashion Wound explored  Laceration Location: Right upper extremity/distal right forearm  Laceration Length: 2.5 cm  No Foreign Bodies seen or palpated  Anesthesia: local infiltration  Local anesthetic: lidocaine 1% without epinephrine  Anesthetic total: 3 ml  Irrigation method: syringe Amount of cleaning: standard  Skin closure: 5-0  Prolene  Number of sutures: 2  Technique: Simple interrupted  Patient tolerance: Patient tolerated the procedure well with no immediate complications.  LACERATION REPAIR 3 Performed by: Harvest Dark Authorized by: Harvest Dark Consent: Verbal consent obtained. Risks and benefits: risks, benefits and alternatives were discussed Consent given by: patient Patient identity confirmed: provided demographic data Prepped and Draped in normal sterile fashion Wound explored  Laceration Location: Dorsal aspect over right first metacarpal  Laceration Length: 2 cm   No Foreign Bodies seen or palpated  Anesthesia: local infiltration  Local anesthetic: lidocaine 1% without epinephrine  Anesthetic total: 3 ml  Irrigation method: syringe Amount of cleaning: standard  Skin closure: 5-0 Prolene  Number of sutures: 3  Technique: Simple interrupted  Patient tolerance: Patient tolerated the procedure well with no immediate complications.    ____________________________________________   FINAL CLINICAL IMPRESSION(S) / ED DIAGNOSES  Lacerations Nerve injury   Harvest Dark, MD 01/11/19 1631    Harvest Dark, MD 01/11/19 1635

## 2019-01-11 NOTE — ED Triage Notes (Signed)
Patient to ED via [facility]. Per patient, he ha been having dizzy spells lately and thinks he got dizzy and fell, head first into a plate glass window. Patient reports loss of consciousness. Patient with lacerations to left arm above elbow and right wrist and forearm. Lacerations were wrapped by EMS, PTA and bleeding appears controlled at this time. Patient able to move all fingers but his right thumb freely. States he thinks right thumb may be broken.

## 2019-01-26 ENCOUNTER — Other Ambulatory Visit: Payer: Self-pay | Admitting: Urology

## 2019-01-26 DIAGNOSIS — R310 Gross hematuria: Secondary | ICD-10-CM

## 2019-02-03 ENCOUNTER — Other Ambulatory Visit: Payer: Self-pay

## 2019-02-03 ENCOUNTER — Ambulatory Visit
Admission: RE | Admit: 2019-02-03 | Discharge: 2019-02-03 | Disposition: A | Discharge status: Home / Self Care | Payer: Medicare Other | Source: Ambulatory Visit | Attending: Urology | Admitting: Urology

## 2019-02-03 DIAGNOSIS — R310 Gross hematuria: Secondary | ICD-10-CM | POA: Diagnosis not present

## 2019-02-03 MED ORDER — IOHEXOL 300 MG/ML  SOLN
100.0000 mL | Freq: Once | INTRAMUSCULAR | Status: AC | PRN
  Administered 2019-02-03: 100 mL via INTRAVENOUS

## 2019-02-22 ENCOUNTER — Telehealth: Payer: Self-pay | Admitting: Family Medicine

## 2019-02-22 NOTE — Telephone Encounter (Signed)
I called the patient to schedule his AWV with Tiffany.  He asked that I call him back after 03/01/2019 when he knows his work schedule.

## 2019-05-24 ENCOUNTER — Other Ambulatory Visit: Payer: Self-pay | Admitting: Family Medicine

## 2019-05-24 DIAGNOSIS — K2100 Gastro-esophageal reflux disease with esophagitis, without bleeding: Secondary | ICD-10-CM

## 2019-05-24 NOTE — Telephone Encounter (Signed)
Pt is requesting refill on omeprazole 20 mg  Pt wife state she is having a hard time getting someone at the [place] to fill prescription

## 2019-05-25 MED ORDER — OMEPRAZOLE 40 MG PO CPDR: 40.0000 mg | DELAYED_RELEASE_CAPSULE | Freq: Two times a day (BID) | ORAL | 1 refills | Status: DC

## 2019-07-24 ENCOUNTER — Other Ambulatory Visit: Payer: Self-pay | Admitting: Family Medicine

## 2019-07-24 DIAGNOSIS — K2100 Gastro-esophageal reflux disease with esophagitis, without bleeding: Secondary | ICD-10-CM

## 2019-07-24 NOTE — Telephone Encounter (Signed)
Requested Prescriptions  Pending Prescriptions Disp Refills  . omeprazole (PRILOSEC) 40 MG capsule [Pharmacy Med Name: OMEPRAZOLE DR 40 MG CAPSULE] 60 capsule 0    Sig: Take 1 capsule (40 mg total) by mouth 2 (two) times daily before a meal.     Gastroenterology: Proton Pump Inhibitors Passed - 07/24/2019  1:05 PM      Passed - Valid encounter within last 12 months    Recent Outpatient Visits          10 months ago Dermatofibroma of calf, right   [place], DO   1 year ago Pneumonia of right lower lobe due to infectious organism [facility])   [place], DO   1 year ago Diarrhea, unspecified type   [place], DO   1 year ago Diarrhea, unspecified type   [place], DO   2 years ago Other acute pulmonary embolism without acute cor pulmonale [facility])   [place], DO              Pt. Is overdue for AWV (see note in chart from 02/22/2019)  Phone call to wife.  Stated she will have the pt. To call and schedule this, as he was not avail. At this time.  Gave a 1 mo. Courtesy refill.

## 2019-12-12 ENCOUNTER — Ambulatory Visit: Payer: Self-pay | Admitting: *Deleted

## 2019-12-12 NOTE — Telephone Encounter (Signed)
Headache for 1 week- patient states he only gets partial relief with Tylenol. Patient fails office visit protocol due to headache. Offered virtual visit- but patient is driving now- working. Advised immediate visit([facility]) if he has any other symptoms. Patient would like office appointment- he states he has had vaccines and has no other symptoms. Told patient I would forward his request for appointment to PCP for review.  Reason for Disposition  [1] SEVERE headache (e.g., excruciating) AND [2] not improved after 2 hours of pain medicine  Answer Assessment - Initial Assessment Questions 1. LOCATION: "Where does it hurt?"      L neck behind ear to eyebrow 2. ONSET: "When did the headache start?" (Minutes, hours or days)      constant 3. PATTERN: "Does the pain come and go, or has it been constant since it started?"     Medication helps the pain- but doesn't make it go away 4. SEVERITY: "How bad is the pain?" and "What does it keep you from doing?"  (e.g., Scale 1-10; mild, moderate, or severe)   - MILD (1-3): doesn't interfere with normal activities    - MODERATE (4-7): interferes with normal activities or awakens from sleep    - SEVERE (8-10): excruciating pain, unable to do any normal activities        moderate  to severe 5. RECURRENT SYMPTOM: "Have you ever had headaches before?" If Yes, ask: "When was the last time?" and "What happened that time?"      Not this bad- has been to see Chiropractor  6. CAUSE: "What do you think is causing the headache?"     unsure 7. MIGRAINE: "Have you been diagnosed with migraine headaches?" If Yes, ask: "Is this headache similar?"      no 8. HEAD INJURY: "Has there been any recent injury to the head?"      no 9. OTHER SYMPTOMS: "Do you have any other symptoms?" (fever, stiff neck, eye pain, sore throat, cold symptoms)     Neck is stiff with the pain- patient thinks neurologic-myathenia gravitis 10. PREGNANCY: "Is there any chance you are pregnant?" "When was  your last menstrual period?"       n/a  Protocols used: HEADACHE-A-AH

## 2019-12-12 NOTE — Telephone Encounter (Signed)
Patient's office appointment is scheduled for 12/13/2019 for HA around 11:20 am.

## 2019-12-13 ENCOUNTER — Other Ambulatory Visit: Payer: Self-pay

## 2019-12-13 ENCOUNTER — Ambulatory Visit (INDEPENDENT_AMBULATORY_CARE_PROVIDER_SITE_OTHER): Payer: Medicare Other | Admitting: Family Medicine

## 2019-12-13 ENCOUNTER — Encounter: Payer: Self-pay | Admitting: Family Medicine

## 2019-12-13 VITALS — BP 128/51 | HR 58 | Temp 97.7°F | Resp 16 | Ht 69.0 in | Wt 196.6 lb

## 2019-12-13 DIAGNOSIS — G7000 Myasthenia gravis without (acute) exacerbation: Secondary | ICD-10-CM | POA: Diagnosis not present

## 2019-12-13 DIAGNOSIS — R519 Headache, unspecified: Secondary | ICD-10-CM

## 2019-12-13 DIAGNOSIS — I2699 Other pulmonary embolism without acute cor pulmonale: Secondary | ICD-10-CM | POA: Diagnosis not present

## 2019-12-13 MED ORDER — GABAPENTIN 100 MG PO CAPS: ORAL_CAPSULE | ORAL | 1 refills | Status: DC

## 2019-12-13 NOTE — Progress Notes (Signed)
Subjective:    Patient ID: [name], male    DOB: 1934/08/14, 85 y.o.   MRN: [id number]  Hezikiah Retzloff Sr. is a 85 y.o. male presenting on 12/13/2019 for Headache (onset 4-5 days -blurred vision)   HPI   Myasthenia Gravis / Blurry Vision / Headache Followed by [facility] - Neurology, original dx 8-9 years ago, has had some difficulty getting in to see them recently, continues on Prednisone 6mg  daily (5+ 1 mg) Followed by Dr Birder Robson at [facility] for myasthenia, next apt 03/15/20, for his yearly, however they are asking about getting in sooner. Asking if we can contact the eye office to try to get him in sooner. - Describes headache mostly unilateral left posterior, persistent for hours/days, improved with tylenol 1-2 pills PRN dosing, yesterday was gone, then headache has come back mild today. Took some ibuprofen temporary it helped more but he is not supposed to take this with Eliquis - He reports some weakness with eyes and blurry vision worse during day or end of day, thinks flare with myasthenia gravis Admits occasional headache causes him to wake up at night, L side behind ear, throbbing aching pain Admits eyelid weakness Denies vision loss, dizziness, lightheaded, nausea vomiting focal neuro deficit or weakness, numbness tingling  Recurrent Pulmonary Emboli / Chronic Anticoagulation History of prior recurrent PE, on anticoagulation Eliquis from [place]  Depression screen [facility] 2/9 12/13/2019 04/04/2018 09/07/2016  Decreased Interest 0 0 0  Down, Depressed, Hopeless 0 0 0  PHQ - 2 Score 0 0 0    Social History   Tobacco Use  . Smoking status: Former Smoker    Quit date: 04/06/1970    Years since quitting: 49.7  . Smokeless tobacco: Former Network engineer  . Vaping Use: Never used  Substance Use Topics  . Alcohol use: Yes    Alcohol/week: 0.0 standard drinks    Comment: Consumes alcohol on occasion  . Drug use: No    Review of Systems Per HPI unless specifically  indicated above     Objective:    BP (!) 128/51   Pulse (!) 58   Temp 97.7 F (36.5 C) (Temporal)   Resp 16   Ht 5\' 9"  (1.753 m)   Wt 196 lb 9.6 oz (89.2 kg)   SpO2 96%   BMI 29.03 kg/m   Wt Readings from Last 3 Encounters:  12/13/19 196 lb 9.6 oz (89.2 kg)  01/11/19 195 lb (88.5 kg)  09/07/18 198 lb (89.8 kg)    Physical Exam Vitals and nursing note reviewed.  Constitutional:      General: He is not in acute distress.    Appearance: He is well-developed. He is not diaphoretic.     Comments: Well-appearing, comfortable, cooperative  HENT:     Head: Normocephalic and atraumatic.  Eyes:     General:        Right eye: No discharge.        Left eye: No discharge.     Conjunctiva/sclera: Conjunctivae normal.  Cardiovascular:     Rate and Rhythm: Normal rate.  Pulmonary:     Effort: Pulmonary effort is normal.  Skin:    General: Skin is warm and dry.     Findings: No erythema or rash.  Neurological:     Mental Status: He is alert and oriented to person, place, and time.  Psychiatric:        Behavior: Behavior normal.     Comments: Well  groomed, good eye contact, normal speech and thoughts       Results for orders placed or performed during the hospital encounter of 63/84/53  Basic metabolic panel  Result Value Ref Range   Sodium 140 135 - 145 mmol/L   Potassium 4.5 3.5 - 5.1 mmol/L   Chloride 110 98 - 111 mmol/L   CO2 22 22 - 32 mmol/L   Glucose, Bld 154 (H) 70 - 99 mg/dL   BUN 23 8 - 23 mg/dL   Creatinine, Ser 1.29 (H) 0.61 - 1.24 mg/dL   Calcium 8.5 (L) 8.9 - 10.3 mg/dL   GFR calc non Af Amer 51 (L) >60 mL/min   GFR calc Af Amer 59 (L) >60 mL/min   Anion gap 8 5 - 15  CBC  Result Value Ref Range   WBC 6.7 4.0 - 10.5 K/uL   RBC 4.30 4.22 - 5.81 MIL/uL   Hemoglobin 14.1 13.0 - 17.0 g/dL   HCT 41.6 39 - 52 %   MCV 96.7 80.0 - 100.0 fL   MCH 32.8 26.0 - 34.0 pg   MCHC 33.9 30.0 - 36.0 g/dL   RDW 13.1 11.5 - 15.5 %   Platelets 194 150 - 400 K/uL   nRBC  0.0 0.0 - 0.2 %      Assessment & Plan:   Problem List Items Addressed This Visit    Recurrent pulmonary emboli (HCC)   Myasthenia gravis (HCC) (Chronic)   Relevant Medications   gabapentin (NEURONTIN) 100 MG capsule    Other Visit Diagnoses    Acute nonintractable headache, unspecified headache type    -  Primary   Relevant Medications   gabapentin (NEURONTIN) 100 MG capsule      #Headache, acute Myasthenia gravis >8-9 years Followed by [facility] neuro, on prednisone chronically 6mg  daily Followed by [facility] Dr George Ina - will call their office today and leave message and request sooner apt if possible given patients worsening symptoms and concern with blurry vision weakening eyelid muscles.  **Update 12/13/19 1:15pm received call back from Dr George Ina office - they will contact Dr George Ina and submit my request and patient should be scheduled for sooner apt - they will notify the patient for scheduling **   #Recurrent pulmonary emboli Chronic problem No acute recurrence [facility]  Meds ordered this encounter  Medications  . gabapentin (NEURONTIN) 100 MG capsule    Sig: Start 1 capsule daily, increase by 1 cap every 2-3 days as tolerated up to 3 times a day, or may take 3 at once in evening.    Dispense:  90 capsule    Refill:  1      Follow up plan: Return if symptoms worsen or fail to improve, for headache.   Nobie Putnam, [facility] 12/13/2019, 11:46 AM

## 2019-12-13 NOTE — Patient Instructions (Addendum)
Thank you for coming to the office today.  Start Gabapentin 100mg  capsules, take at night for 2-3 nights only, and then increase to 2 times a day for a few days, and then may increase to 3 times a day, it may make you drowsy, if helps significantly at night only, then you can increase instead to 3 capsules at night, instead of 3 times a day - In the future if needed, we can significantly increase the dose if tolerated well, some common doses are 300mg  three times a day up to 600mg  three times a day, usually it takes several weeks or months to get to higher doses  Recommend to start taking Tylenol Extra Strength 500mg  tabs - take 1 to 2 tabs per dose (max 1000mg ) every 6-8 hours for pain (take regularly, don't skip a dose for next 7 days), max 24 hour daily dose is 6 tablets or 3000mg . In the future you can repeat the same everyday Tylenol course for 1-2 weeks at a time.   We will call Dr George Ina office to try to get you in sooner.  Please schedule a Follow-up Appointment to: Return if symptoms worsen or fail to improve, for headache.  If you have any other questions or concerns, please feel free to call the office or send a message through [place]. You may also schedule an earlier appointment if necessary.  Additionally, you may be receiving a survey about your experience at our office within a few days to 1 week by e-mail or mail. We value your feedback.  Nobie Putnam, DO [place]

## 2019-12-25 ENCOUNTER — Telehealth: Payer: Self-pay | Admitting: Family Medicine

## 2019-12-25 NOTE — Telephone Encounter (Signed)
Pt wife crystal is calling and would like dr Raliegh Ip to order her husband to have a ct scan of head due to daily headache and headaches are bad per wife. Pt has seen eye doctor that exam was normal  and seen dentist and had a tooth removed. Pt seen dr Raliegh Ip on 12-13-2019 for headaches

## 2019-12-25 NOTE — Telephone Encounter (Signed)
CT Scan of head does not always show the answer to a headache.  Given his history of myasthenia gravis, and other factors - I would recommend that he see a Neurologist. They may order an MRI or other test instead.  He has followed with Neurologist through [place].  Can he return to [facility] Neurologist for this headache?  Or we can refer him to other Neurologist locally at [facility] if he prefers.  Nobie Putnam, DO [facility] 12/25/2019, 1:16 PM

## 2019-12-25 NOTE — Telephone Encounter (Signed)
Inform the patient as per Dr Raliegh Ip he will follow up with [facility] neurologist.

## 2019-12-25 NOTE — Telephone Encounter (Signed)
Patient was seen on 12/13/19 for headaches.  Please advise if patient needs another appointment.

## 2019-12-26 NOTE — Telephone Encounter (Signed)
Spoke to [facility] they will try to double book the appointment requesting office notes to be faxed to [phone] and notify patient and also notify wife for updates.

## 2019-12-26 NOTE — Telephone Encounter (Signed)
Office notes were faxed

## 2019-12-26 NOTE — Telephone Encounter (Signed)
Thanks for calling to help schedule.  If it turns out we need to refer to [facility] or do anything else, let me know, otherwise [facility] can [facility].  Thanks  Nobie Putnam, DO [facility] 12/26/2019, 12:33 PM

## 2019-12-26 NOTE — Telephone Encounter (Signed)
Pts wife called and is requesting to speak with nurse regarding having a referral for a neurologist for pt since the CT scan was not possible. Please advise.

## 2020-09-11 ENCOUNTER — Institutional Professional Consult (permissible substitution): Payer: No Typology Code available for payment source | Admitting: Cardiology

## 2020-09-19 NOTE — Progress Notes (Signed)
Electrophysiology Office Note:    Date:  09/20/2020   ID:  Elijah Ponder Sr., DOB 01/21/1935, MRN [id number]  PCP:  Olin Hauser, DO  [facility] Cardiologist:  None  [facility] Electrophysiologist:  Vickie Epley, MD   Referring MD: Nobie Putnam *   Chief Complaint: Symptomatic high degree AV block  History of Present Illness:    Elijah Dinkins Sr. is a 86 y.o. male who presents for an evaluation of symptomatic high degree AV block at the request of Dr. Parks Ranger. Their medical history includes hypertension, hyperlipidemia, myasthenia gravis, obesity, prostate cancer.  The patient was seen Aug 13, 2020 at the [place].  From their notes, he has a history of PE on Eliquis, hypertension, myasthenia gravis.  The patient was seen by his primary care physician in March and reported falls and dizziness that are becoming frequent.  The dizziness was mostly with changes in position.  A ZIO monitor was ordered which showed nonsustained VT, AV Wenke block, 2 episodes of high degree AV block during the sleep hours.  He is also had a treadmill ECG test as part of his evaluation.  The treadmill ECG test was performed Aug 14, 2020.  The resting EKG showed sinus rhythm with first-degree AV block with a ventricular rate of 63 bpm.  The patient was exercised on a Bruce protocol where he achieved 4.6 METS and a peak heart rate of 125 bpm which was 91% of his maximum predicted heart rate.  During the exercise ECG, there were isolated PVCs and intermittent second-degree AV block type I and II.  The procedure was stopped because of leg discomfort.  Today the patient tells me that he has a long history of dizziness.  Most commonly the symptoms of dizziness occur with changes of position.  He tells me that the whole world starts to spin.  He works at a Agricultural consultant.  He tells me that frequently he will be at work and he has a prodrome of dizziness and room spinning and he knows he "needs  to get to the ground to make it stop".  He has never had abrupt syncope.  He does not feel palpitations prior to his dizziness.  He is not completely blacked out.  He tells me that sometimes he has a ringing in his right ear that occurs prior to the room spinning and him feeling lightheaded.  He did not have symptoms of lightheadedness or dizziness during the exercise ECG.  The patient tells me multiple times during my interview with him today that he does not think his heart rhythm is causing his lightheadedness and he is concerned that it is something with his ears.   Past Medical History:  Diagnosis Date   Dyslipidemia    GERD (gastroesophageal reflux disease)    Glaucoma    Hyperlipidemia    Hypertension    Insomnia    Myasthenia gravis ([place]) 09/08/2012   Obesity    Ocular myasthenia gravis ([place])    Prostate cancer ([place])     Past Surgical History:  Procedure Laterality Date   CATARACT EXTRACTION Bilateral    CHOLECYSTECTOMY     TRANSURETHRAL RESECTION OF PROSTATE      Current Medications: Current Meds  Medication Sig   acetaminophen (TYLENOL) 325 MG tablet Take 1 tablet (325 mg total) by mouth every 6 (six) hours as needed for mild pain (or Fever >/= 101).   apixaban (ELIQUIS) 5 MG TABS tablet Take 1 tablet (5 mg total)  by mouth 2 (two) times daily.   atorvastatin (LIPITOR) 10 MG tablet Take 10 mg by mouth daily.   feeding supplement, ENSURE ENLIVE, (ENSURE ENLIVE) LIQD Take 237 mLs by mouth 2 (two) times daily between meals.   gabapentin (NEURONTIN) 100 MG capsule Start 1 capsule daily, increase by 1 cap every 2-3 days as tolerated up to 3 times a day, or may take 3 at once in evening.   latanoprost (XALATAN) 0.005 % ophthalmic solution Place 1 drop into both eyes at bedtime.   lisinopril (PRINIVIL,ZESTRIL) 40 MG tablet Take 0.5 tablets (20 mg total) by mouth daily. (Patient taking differently: Take 40 mg by mouth daily.)   memantine (NAMENDA) 10 MG tablet Take 10 mg by mouth See  admin instructions. Take one-half tablet at bedtime for 2 weeks, then take one tablet at bedtime for 2 weeks, then take one and one-half tablets at bedtime for 2 weeks, then take two tablets at bedtime to slow memory loss   methocarbamol (ROBAXIN) 500 MG tablet Take 500 mg by mouth in the morning, at noon, and at bedtime.   omeprazole (PRILOSEC) 40 MG capsule Take 1 capsule (40 mg total) by mouth 2 (two) times daily before a meal.   predniSONE (DELTASONE) 1 MG tablet Take 1 mg by mouth daily with breakfast.   predniSONE (DELTASONE) 5 MG tablet Take 5 mg by mouth daily.   senna-docusate (SENOKOT-S) 8.6-50 MG tablet Take 1 tablet by mouth at bedtime as needed for mild constipation.   Vitamins/Minerals TABS Take by mouth.   zolpidem (AMBIEN) 5 MG tablet Take 5 mg by mouth at bedtime as needed for sleep.     Allergies:   Patient has no known allergies.   Social History   Socioeconomic History   Marital status: Married    Spouse name: Economist of children: 3   Years of education: 16   Highest education level: Not on file  Occupational History    Comment: retired  Tobacco Use   Smoking status: Former    Pack years: 0.00    Types: Cigarettes    Quit date: 04/06/1970    Years since quitting: 50.4   Smokeless tobacco: Former  Scientific laboratory technician Use: Never used  Substance and Sexual Activity   Alcohol use: Yes    Alcohol/week: 0.0 standard drinks    Comment: Consumes alcohol on occasion   Drug use: No   Sexual activity: Not on file  Other Topics Concern   Not on file  Social History Narrative   Patient lives at home with his wife Veterinary surgeon)   Retired - [employer]   [place]   Right handed.   Caffeine- four cups daily.            Social Determinants of Health   Financial Resource Strain: Not on file  Food Insecurity: Not on file  Transportation Needs: Not on file  Physical Activity: Not on file  Stress: Not on file  Social Connections: Not on file     Family  History: The patient's family history includes Heart attack in his father.  ROS:   Please see the history of present illness.    All other systems reviewed and are negative.  EKGs/Labs/Other Studies Reviewed:    [facility]  Outside treadmill stress test reviewed (report only) Resting EKG showed sinus rhythm with first-degree AV block Resting heart rate 63 bpm Patient exercised on a Bruce protocol for  1 minute 15 seconds achieving 4.6 METS Peak heart rate 125 bpm, 91% maximum predicted heart rate Isolated PVCs and second-degree AV block type I and II during stress  EKG:  The ekg ordered today demonstrates sinus rhythm with a first-degree AV delay.  Recent Labs: No results found for requested labs within last 8760 hours.  Recent Lipid Panel No results found for: CHOL, TRIG, HDL, CHOLHDL, VLDL, LDLCALC, LDLDIRECT  Physical Exam:    VS:  BP (!) 144/88   Pulse 60   Ht 5\' 9"  (1.753 m)   Wt 197 lb (89.4 kg)   BMI 29.09 kg/m     Wt Readings from Last 3 Encounters:  09/20/20 197 lb (89.4 kg)  12/13/19 196 lb 9.6 oz (89.2 kg)  01/11/19 195 lb (88.5 kg)     GEN:  Well nourished, well developed in no acute distress HEENT: Normal NECK: No JVD; No carotid bruits LYMPHATICS: No lymphadenopathy CARDIAC: RRR, no murmurs, rubs, gallops RESPIRATORY:  Clear to auscultation without rales, wheezing or rhonchi  ABDOMEN: Soft, non-tender, non-distended MUSCULOSKELETAL:  No edema; No deformity  SKIN: Warm and dry NEUROLOGIC:  Alert and oriented x 3 PSYCHIATRIC:  Normal affect   ASSESSMENT:    1. AV block, complete (HCC)   2. Dizziness   3. 2nd degree AV block    PLAN:    In order of problems listed above:  Dizziness  the patient has a long history of dizziness.  By history the symptoms of dizziness seem to be most consistent with a diagnosis of orthostatic intolerance.  I am also concerned that there may be something related to his  inner ear/vertigo causing some of his symptoms.  I am not convinced that there is an arrhythmic cause of his symptoms.  I think further diagnostic evaluation is warranted prior to proceeding with permanent pacemaker implant.  I discussed this extensively with the patient and his wife today who are in agreement that they would like to be sure the diagnosis prior to proceeding with permanent pacemaker implant. I discussed using a loop recorder for better diagnostic information.  I described the procedure, risks and recovery time and he wishes to proceed.  We will get him scheduled. Today also discussed the permanent pacemaker procedure in detail and case that is ultimately needed for him. I did discuss with the patient that it is very possible that we could put the loop recorder in and need to quickly proceed to permanent pacemaker implant and he is understanding.   Total time spent with patient today 65 minutes. This includes reviewing records, evaluating the patient and coordinating care.  Medication Adjustments/Labs and Tests Ordered: Current medicines are reviewed at length with the patient today.  Concerns regarding medicines are outlined above.  Orders Placed This Encounter  Procedures   EKG 12-Lead    No orders of the defined types were placed in this encounter.    Signed, Hilton Cork. Quentin Ore, MD, [facility], [facility] 09/20/2020 5:57 PM    Electrophysiology [facility]

## 2020-09-20 ENCOUNTER — Other Ambulatory Visit: Payer: Self-pay

## 2020-09-20 ENCOUNTER — Ambulatory Visit (INDEPENDENT_AMBULATORY_CARE_PROVIDER_SITE_OTHER): Payer: No Typology Code available for payment source | Admitting: Cardiology

## 2020-09-20 VITALS — BP 144/88 | HR 60 | Ht 69.0 in | Wt 197.0 lb

## 2020-09-20 DIAGNOSIS — I442 Atrioventricular block, complete: Secondary | ICD-10-CM | POA: Diagnosis not present

## 2020-09-20 DIAGNOSIS — R42 Dizziness and giddiness: Secondary | ICD-10-CM | POA: Diagnosis not present

## 2020-09-20 DIAGNOSIS — I441 Atrioventricular block, second degree: Secondary | ICD-10-CM | POA: Diagnosis not present

## 2020-09-20 NOTE — Patient Instructions (Signed)
Medication Instructions:  No changes *If you need a refill on your cardiac medications before your next appointment, please call your pharmacy*   Lab Work: none If you have labs (blood work) drawn today and your tests are completely normal, you will receive your results only by: [place] (if you have MyChart) OR A paper copy in the mail If you have any lab test that is abnormal or we need to change your treatment, we will call you to review the results.   Testing/Procedures: Loop Implant   Follow-Up:  As noted below for Loop Recorder Implant  Other Instructions

## 2020-11-19 ENCOUNTER — Ambulatory Visit: Payer: No Typology Code available for payment source | Admitting: Cardiology

## 2020-11-19 DIAGNOSIS — R42 Dizziness and giddiness: Secondary | ICD-10-CM

## 2020-11-19 NOTE — Progress Notes (Deleted)
Electrophysiology Office Follow up Visit Note:    Date:  11/19/2020   ID:  Elijah Ponder Sr., DOB February 17, 1935, MRN [id number]  PCP:  Olin Hauser, DO  [place] Cardiologist:  None  [facility] Electrophysiologist:  Vickie Epley, MD    Interval History:    Elijah Baile Sr. is a 86 y.o. male who presents for a follow up visit. They were last seen in clinic 09/20/2020 for dizziness. He does have a history of abnormal AV conduction but that has been predominantly at night.   At our last appointment we discussed using a loop recorder for ongoing monitoring of his heart rhythm in an effort to link symptoms to heart rhythm abnormalities.   Past Medical History:  Diagnosis Date   Dyslipidemia    GERD (gastroesophageal reflux disease)    Glaucoma    Hyperlipidemia    Hypertension    Insomnia    Myasthenia gravis ([place]) 09/08/2012   Obesity    Ocular myasthenia gravis ([place])    Prostate cancer [facility])     Past Surgical History:  Procedure Laterality Date   CATARACT EXTRACTION Bilateral    CHOLECYSTECTOMY     TRANSURETHRAL RESECTION OF PROSTATE      Current Medications: No outpatient medications have been marked as taking for the 11/19/20 encounter (Appointment) with Vickie Epley, MD.     Allergies:   Patient has no known allergies.   Social History   Socioeconomic History   Marital status: Married    Spouse name: Economist of children: 3   Years of education: 16   Highest education level: Not on file  Occupational History    Comment: retired  Tobacco Use   Smoking status: Former    Types: Cigarettes    Quit date: 04/06/1970    Years since quitting: 50.6   Smokeless tobacco: Former  Scientific laboratory technician Use: Never used  Substance and Sexual Activity   Alcohol use: Yes    Alcohol/week: 0.0 standard drinks    Comment: Consumes alcohol on occasion   Drug use: No   Sexual activity: Not on file  Other Topics Concern   Not on file   Social History Narrative   Patient lives at home with his wife Veterinary surgeon)   Retired - [employer]   [place]   Right handed.   Caffeine- four cups daily.            Social Determinants of Health   Financial Resource Strain: Not on file  Food Insecurity: Not on file  Transportation Needs: Not on file  Physical Activity: Not on file  Stress: Not on file  Social Connections: Not on file     Family History: The patient's family history includes Heart attack in his father.  ROS:   Please see the history of present illness.    All other systems reviewed and are negative.  EKGs/Labs/Other Studies Reviewed:    The following studies were reviewed today:   EKG:  The ekg ordered today demonstrates ***  Recent Labs: No results found for requested labs within last 8760 hours.  Recent Lipid Panel No results found for: CHOL, TRIG, HDL, CHOLHDL, VLDL, LDLCALC, LDLDIRECT  Physical Exam:    VS:  There were no vitals taken for this visit.    Wt Readings from Last 3 Encounters:  09/20/20 197 lb (89.4 kg)  12/13/19 196 lb 9.6 oz (89.2 kg)  01/11/19 195 lb (88.5 kg)  GEN: *** Well nourished, well developed in no acute distress HEENT: Normal NECK: No JVD; No carotid bruits LYMPHATICS: No lymphadenopathy CARDIAC: ***RRR, no murmurs, rubs, gallops RESPIRATORY:  Clear to auscultation without rales, wheezing or rhonchi  ABDOMEN: Soft, non-tender, non-distended MUSCULOSKELETAL:  No edema; No deformity  SKIN: Warm and dry NEUROLOGIC:  Alert and oriented x 3 PSYCHIATRIC:  Normal affect   ASSESSMENT:    1. Dizziness    PLAN:    In order of problems listed above:           Total time spent with patient today *** minutes. This includes reviewing records, evaluating the patient and coordinating care.   Medication Adjustments/Labs and Tests Ordered: Current medicines are reviewed at length with the patient today.  Concerns regarding medicines are outlined above.   No orders of the defined types were placed in this encounter.  No orders of the defined types were placed in this encounter.    Signed, Lars Mage, MD, [facility], [facility] 11/19/2020 5:48 AM    Electrophysiology [facility]   --------------------------------------------------------------------------  SURGEON:  Lars Mage, MD    PREPROCEDURE DIAGNOSIS:  Dizziness    POSTPROCEDURE DIAGNOSIS:  Dizziness     PROCEDURES:   1. Implantable loop recorder implantation    INTRODUCTION:  Elijah Faz Sr. is a 86 y.o. patient with a history of dizziness and complete heart block only at night presents for loop recorder implant in an effort to link daytime symptoms to any abnormal heart rhythm.  DESCRIPTION OF PROCEDURE:  Informed written consent was obtained.  The patient required no sedation for the procedure today.  Mapping over the patient's chest was performed to identify the area where electrograms were most prominent for ILR recording.  This area was found to be the left parasternal region over the 4th intercostal space. The patients left chest was therefore prepped and draped in the usual sterile fashion. The skin overlying the left parasternal region was infiltrated with lidocaine for local analgesia.  A 0.5-cm incision was made over the left parasternal region over the 3rd intercostal space.  A subcutaneous ILR pocket was fashioned using a combination of sharp and blunt dissection.  A Medtronic Reveal Linq model [id number] (***) implantable loop recorder was then placed into the pocket  R waves were very prominent and measured >0.105m.  Steri- Strips and a sterile dressing were then applied.  There were no early apparent complications.     CONCLUSIONS:   1. Successful implantation of a Medtronic Reveal LINQ implantable loop recorder for a history of dizziness  2. No early apparent complications.   CLars Mage MD

## 2020-12-17 ENCOUNTER — Ambulatory Visit (INDEPENDENT_AMBULATORY_CARE_PROVIDER_SITE_OTHER): Payer: Medicare Other

## 2020-12-17 VITALS — Ht 69.5 in | Wt 192.0 lb

## 2020-12-17 DIAGNOSIS — Z0000 Encounter for general adult medical examination without abnormal findings: Secondary | ICD-10-CM | POA: Diagnosis not present

## 2020-12-17 NOTE — Patient Instructions (Signed)
[name] , Thank you for taking time to come for your Medicare Wellness Visit. I appreciate your ongoing commitment to your health goals. Please review the following plan we discussed and let me know if I can assist you in the future.   Screening recommendations/referrals: Colonoscopy: not required Recommended yearly ophthalmology/optometry visit for glaucoma screening and checkup Recommended yearly dental visit for hygiene and checkup  Vaccinations: Influenza vaccine: due Pneumococcal vaccine: completed 11/11/2016 Tdap vaccine: completed 01/11/2019, due 01/10/2029 Shingles vaccine: discussed   Covid-19:  01/03/2020, 06/14/2019, 05/20/2019  Advanced directives: Please bring a copy of your POA (Power of Attorney) and/or Living Will to your next appointment.   Conditions/risks identified: none  Next appointment: Follow up in one year for your annual wellness visit.   Preventive Care 21 Years and Older, Male Preventive care refers to lifestyle choices and visits with your health care provider that can promote health and wellness. What does preventive care include? A yearly physical exam. This is also called an annual well check. Dental exams once or twice a year. Routine eye exams. Ask your health care provider how often you should have your eyes checked. Personal lifestyle choices, including: Daily care of your teeth and gums. Regular physical activity. Eating a healthy diet. Avoiding tobacco and drug use. Limiting alcohol use. Practicing safe sex. Taking low doses of aspirin every day. Taking vitamin and mineral supplements as recommended by your health care provider. What happens during an annual well check? The services and screenings done by your health care provider during your annual well check will depend on your age, overall health, lifestyle risk factors, and family history of disease. Counseling  Your health care provider may ask you questions about your: Alcohol  use. Tobacco use. Drug use. Emotional well-being. Home and relationship well-being. Sexual activity. Eating habits. History of falls. Memory and ability to understand (cognition). Work and work Statistician. Screening  You may have the following tests or measurements: Height, weight, and BMI. Blood pressure. Lipid and cholesterol levels. These may be checked every 5 years, or more frequently if you are over 56 years old. Skin check. Lung cancer screening. You may have this screening every year starting at age 40 if you have a 30-pack-year history of smoking and currently smoke or have quit within the past 15 years. Fecal occult blood test (FOBT) of the stool. You may have this test every year starting at age 83. Flexible sigmoidoscopy or colonoscopy. You may have a sigmoidoscopy every 5 years or a colonoscopy every 10 years starting at age 11. Prostate cancer screening. Recommendations will vary depending on your family history and other risks. Hepatitis C blood test. Hepatitis B blood test. Sexually transmitted disease (STD) testing. Diabetes screening. This is done by checking your blood sugar (glucose) after you have not eaten for a while (fasting). You may have this done every 1-3 years. Abdominal aortic aneurysm (AAA) screening. You may need this if you are a current or former smoker. Osteoporosis. You may be screened starting at age 28 if you are at high risk. Talk with your health care provider about your test results, treatment options, and if necessary, the need for more tests. Vaccines  Your health care provider may recommend certain vaccines, such as: Influenza vaccine. This is recommended every year. Tetanus, diphtheria, and acellular pertussis (Tdap, Td) vaccine. You may need a Td booster every 10 years. Zoster vaccine. You may need this after age 78. Pneumococcal 13-valent conjugate (PCV13) vaccine. One dose is recommended after  age 60. Pneumococcal polysaccharide  (PPSV23) vaccine. One dose is recommended after age 66. Talk to your health care provider about which screenings and vaccines you need and how often you need them. This information is not intended to replace advice given to you by your health care provider. Make sure you discuss any questions you have with your health care provider. Document Released: 04/19/2015 Document Revised: 12/11/2015 Document Reviewed: 01/22/2015 Elsevier Interactive Patient Education  2017 [place] Prevention in the Home Falls can cause injuries. They can happen to people of all ages. There are many things you can do to make your home safe and to help prevent falls. What can I do on the outside of my home? Regularly fix the edges of walkways and driveways and fix any cracks. Remove anything that might make you trip as you walk through a door, such as a raised step or threshold. Trim any bushes or trees on the path to your home. Use bright outdoor lighting. Clear any walking paths of anything that might make someone trip, such as rocks or tools. Regularly check to see if handrails are loose or broken. Make sure that both sides of any steps have handrails. Any raised decks and porches should have guardrails on the edges. Have any leaves, snow, or ice cleared regularly. Use sand or salt on walking paths during winter. Clean up any spills in your garage right away. This includes oil or grease spills. What can I do in the bathroom? Use night lights. Install grab bars by the toilet and in the tub and shower. Do not use towel bars as grab bars. Use non-skid mats or decals in the tub or shower. If you need to sit down in the shower, use a plastic, non-slip stool. Keep the floor dry. Clean up any water that spills on the floor as soon as it happens. Remove soap buildup in the tub or shower regularly. Attach bath mats securely with double-sided non-slip rug tape. Do not have throw rugs and other things on the  floor that can make you trip. What can I do in the bedroom? Use night lights. Make sure that you have a light by your bed that is easy to reach. Do not use any sheets or blankets that are too big for your bed. They should not hang down onto the floor. Have a firm chair that has side arms. You can use this for support while you get dressed. Do not have throw rugs and other things on the floor that can make you trip. What can I do in the kitchen? Clean up any spills right away. Avoid walking on wet floors. Keep items that you use a lot in easy-to-reach places. If you need to reach something above you, use a strong step stool that has a grab bar. Keep electrical cords out of the way. Do not use floor polish or wax that makes floors slippery. If you must use wax, use non-skid floor wax. Do not have throw rugs and other things on the floor that can make you trip. What can I do with my stairs? Do not leave any items on the stairs. Make sure that there are handrails on both sides of the stairs and use them. Fix handrails that are broken or loose. Make sure that handrails are as long as the stairways. Check any carpeting to make sure that it is firmly attached to the stairs. Fix any carpet that is loose or worn. Avoid having throw rugs  at the top or bottom of the stairs. If you do have throw rugs, attach them to the floor with carpet tape. Make sure that you have a light switch at the top of the stairs and the bottom of the stairs. If you do not have them, ask someone to add them for you. What else can I do to help prevent falls? Wear shoes that: Do not have high heels. Have rubber bottoms. Are comfortable and fit you well. Are closed at the toe. Do not wear sandals. If you use a stepladder: Make sure that it is fully opened. Do not climb a closed stepladder. Make sure that both sides of the stepladder are locked into place. Ask someone to hold it for you, if possible. Clearly mark and make  sure that you can see: Any grab bars or handrails. First and last steps. Where the edge of each step is. Use tools that help you move around (mobility aids) if they are needed. These include: Canes. Walkers. Scooters. Crutches. Turn on the lights when you go into a dark area. Replace any light bulbs as soon as they burn out. Set up your furniture so you have a clear path. Avoid moving your furniture around. If any of your floors are uneven, fix them. If there are any pets around you, be aware of where they are. Review your medicines with your doctor. Some medicines can make you feel dizzy. This can increase your chance of falling. Ask your doctor what other things that you can do to help prevent falls. This information is not intended to replace advice given to you by your health care provider. Make sure you discuss any questions you have with your health care provider. Document Released: 01/17/2009 Document Revised: 08/29/2015 Document Reviewed: 04/27/2014 Elsevier Interactive Patient Education  2017 [employer].

## 2020-12-17 NOTE — Progress Notes (Signed)
I connected with Elijah Ponder Sr. today by telephone and verified that I am speaking with the correct person using two identifiers. Location patient: home Location provider: work Persons participating in the virtual visit: Elijah Ponder Sr, Glenna Durand LPN.   I discussed the limitations, risks, security and privacy concerns of performing an evaluation and management service by telephone and the availability of in person appointments. I also discussed with the patient that there may be a patient responsible charge related to this service. The patient expressed understanding and verbally consented to this telephonic visit.    Interactive audio and video telecommunications were attempted between this provider and patient, however failed, due to patient having technical difficulties OR patient did not have access to video capability.  We continued and completed visit with audio only.     Vital signs may be patient reported or missing.  Subjective:   Elijah Bartold Sr. is a 86 y.o. male who presents for an Initial Medicare Annual Wellness Visit.  Review of Systems     Cardiac Risk Factors include: advanced age (>43mn, >>12women);dyslipidemia;hypertension;male gender;sedentary lifestyle     Objective:    Today's Vitals   12/17/20 1056  Weight: 192 lb (87.1 kg)  Height: 5' 9.5" (1.765 m)   Body mass index is 27.95 kg/m.  Advanced Directives 12/17/2020 01/11/2019 03/31/2018 01/12/2017 01/12/2017 01/08/2017 01/08/2017  Does Patient Have a Medical Advance Directive? Yes No Yes Yes No No;Yes No  Type of [place]amed[place]of [place] will - Living will Healthcare Power of [place] will -  Does patient want to make changes to medical advance directive? - - No - Patient declined No - Patient declined - - -  Copy of [place] Chart? No - copy requested - - - - No - copy requested -  Would patient like information  on creating a medical advance directive? - - - - - No - Patient declined No - Patient declined    Current Medications (verified) Outpatient Encounter Medications as of 12/17/2020  Medication Sig   acetaminophen (TYLENOL) 325 MG tablet Take 1 tablet (325 mg total) by mouth every 6 (six) hours as needed for mild pain (or Fever >/= 101).   apixaban (ELIQUIS) 5 MG TABS tablet Take 1 tablet (5 mg total) by mouth 2 (two) times daily.   atorvastatin (LIPITOR) 10 MG tablet Take 10 mg by mouth daily.   gabapentin (NEURONTIN) 100 MG capsule Start 1 capsule daily, increase by 1 cap every 2-3 days as tolerated up to 3 times a day, or may take 3 at once in evening.   latanoprost (XALATAN) 0.005 % ophthalmic solution Place 1 drop into both eyes at bedtime.   lisinopril (PRINIVIL,ZESTRIL) 40 MG tablet Take 0.5 tablets (20 mg total) by mouth daily. (Patient taking differently: Take 40 mg by mouth daily.)   memantine (NAMENDA) 10 MG tablet Take 10 mg by mouth See admin instructions. Take one-half tablet at bedtime for 2 weeks, then take one tablet at bedtime for 2 weeks, then take one and one-half tablets at bedtime for 2 weeks, then take two tablets at bedtime to slow memory loss   methocarbamol (ROBAXIN) 500 MG tablet Take 500 mg by mouth in the morning, at noon, and at bedtime.   omeprazole (PRILOSEC) 40 MG capsule Take 1 capsule (40 mg total) by mouth 2 (two) times daily before a meal.   predniSONE (DELTASONE) 1 MG tablet Take 1 mg by  mouth daily with breakfast.   predniSONE (DELTASONE) 5 MG tablet Take 5 mg by mouth daily.   senna-docusate (SENOKOT-S) 8.6-50 MG tablet Take 1 tablet by mouth at bedtime as needed for mild constipation.   Vitamins/Minerals TABS Take by mouth.   zolpidem (AMBIEN) 5 MG tablet Take 5 mg by mouth at bedtime as needed for sleep.   feeding supplement, ENSURE ENLIVE, (ENSURE ENLIVE) LIQD Take 237 mLs by mouth 2 (two) times daily between meals. (Patient not taking: Reported on  12/17/2020)   No facility-administered encounter medications on file as of 12/17/2020.    Allergies (verified) Patient has no known allergies.   History: Past Medical History:  Diagnosis Date   Dyslipidemia    GERD (gastroesophageal reflux disease)    Glaucoma    Hyperlipidemia    Hypertension    Insomnia    Myasthenia gravis ([place]) 09/08/2012   Obesity    Ocular myasthenia gravis ([place])    Prostate cancer ([place])    Past Surgical History:  Procedure Laterality Date   CATARACT EXTRACTION Bilateral    CHOLECYSTECTOMY     TRANSURETHRAL RESECTION OF PROSTATE     Family History  Problem Relation Age of Onset   Heart attack Father    Social History   Socioeconomic History   Marital status: Married    Spouse name: Crystal   Number of children: 3   Years of education: 16   Highest education level: Not on file  Occupational History    Comment: retired  Tobacco Use   Smoking status: Former    Types: Cigarettes    Quit date: 04/06/1970    Years since quitting: 50.7   Smokeless tobacco: Former  Scientific laboratory technician Use: Never used  Substance and Sexual Activity   Alcohol use: Yes    Alcohol/week: 0.0 standard drinks    Comment: Consumes alcohol on occasion   Drug use: No   Sexual activity: Not on file  Other Topics Concern   Not on file  Social History Narrative   Patient lives at home with his wife Veterinary surgeon)   Retired - [employer]   [place]   Right handed.   Caffeine- four cups daily.            Social Determinants of Health   Financial Resource Strain: Low Risk    Difficulty of Paying Living Expenses: Not hard at all  Food Insecurity: No Food Insecurity   Worried About Charity fundraiser in the Last Year: Never true   [place] in the Last Year: Never true  Transportation Needs: No Transportation Needs   Lack of Transportation (Medical): No   Lack of Transportation (Non-Medical): No  Physical Activity: Inactive   Days of Exercise per Week: 0 days    Minutes of Exercise per Session: 0 min  Stress: No Stress Concern Present   Feeling of Stress : Not at all  Social Connections: Not on file    Tobacco Counseling Counseling given: Not Answered   Clinical Intake:  Pre-visit preparation completed: Yes  Pain : No/denies pain     Nutritional Status: BMI 25 -29 Overweight Nutritional Risks: None Diabetes: No  How often do you need to have someone help you when you read instructions, pamphlets, or other written materials from your doctor or pharmacy?: 1 - Never What is the last grade level you completed in school?: 12th grade  Diabetic? no  Interpreter Needed?: No  Information entered by :: NAllen LPN  Activities of Daily Living In your present state of health, do you have any difficulty performing the following activities: 12/17/2020  Hearing? N  Vision? N  Difficulty concentrating or making decisions? N  Walking or climbing stairs? Y  Dressing or bathing? N  Doing errands, shopping? N  Preparing Food and eating ? N  Using the Toilet? N  In the past six months, have you accidently leaked urine? N  Do you have problems with loss of bowel control? N  Managing your Medications? N  Managing your Finances? N  Housekeeping or managing your Housekeeping? N  Some recent data might be hidden    Patient Care Team: Olin Hauser, DO as PCP - General (Family Medicine) Vickie Epley, MD as PCP - Electrophysiology (Cardiology)  Indicate any recent Medical Services you may have received from other than [facility] providers in the past year (date may be approximate).     Assessment:   This is a routine wellness examination for Shadi.  Hearing/Vision screen Vision Screening - Comments:: Regular eye exams, [facility]  Dietary issues and exercise activities discussed: Current Exercise Habits: The patient does not participate in regular exercise at present   Goals Addressed             This Visit's  Progress    Patient Stated       12/17/2020, no goals       Depression Screen PHQ 2/9 Scores 12/17/2020 12/13/2019 04/04/2018 09/07/2016  PHQ - 2 Score 0 0 0 0    Fall Risk Fall Risk  12/17/2020 12/13/2019 04/04/2018 09/07/2016  Falls in the past year? 0 0 0 No  Number falls in past yr: - 0 - -  Injury with Fall? - 0 - -  Risk for fall due to : Medication side effect - - -  Follow up Falls evaluation completed;Education provided;Falls prevention discussed Falls evaluation completed Falls evaluation completed -    FALL RISK PREVENTION PERTAINING TO THE HOME:  Any stairs in or around the home? No  If so, are there any without handrails?  N/a Home free of loose throw rugs in walkways, pet beds, electrical cords, etc? Yes  Adequate lighting in your home to reduce risk of falls? Yes   ASSISTIVE DEVICES UTILIZED TO PREVENT FALLS:  Life alert? No  Use of a cane, walker or w/c? No  Grab bars in the bathroom? Yes  Shower chair or bench in shower? Yes  Elevated toilet seat or a handicapped toilet? Yes   TIMED UP AND GO:  Was the test performed? No .      Cognitive Function:     6CIT Screen 12/17/2020  What Year? 0 points  What month? 0 points  What time? 0 points  Count back from 20 2 points  Months in reverse 0 points  Repeat phrase 10 points  Total Score 12    Immunizations Immunization History  Administered Date(s) Administered   Influenza, High Dose Seasonal PF 01/27/2016   Td 05/21/2014, 01/11/2019    TDAP status: Up to date  Flu Vaccine status: Due, Education has been provided regarding the importance of this vaccine. Advised may receive this vaccine at local pharmacy or Health Dept. Aware to provide a copy of the vaccination record if obtained from local pharmacy or Health Dept. Verbalized acceptance and understanding.  Pneumococcal vaccine status: Up to date  Covid-19 vaccine status: Completed vaccines  Qualifies for Shingles Vaccine? Yes   Zostavax completed  No  Shingrix Completed?: No.    Education has been provided regarding the importance of this vaccine. Patient has been advised to call insurance company to determine out of pocket expense if they have not yet received this vaccine. Advised may also receive vaccine at local pharmacy or Health Dept. Verbalized acceptance and understanding.  Screening Tests Health Maintenance  Topic Date Due   Zoster Vaccines- Shingrix (1 of 2) Never done   COVID-19 Vaccine (4 - Booster for Pfizer series) 03/27/2020   INFLUENZA VACCINE  11/04/2020   TETANUS/TDAP  01/10/2029   PNA vac Low Risk Adult  Completed   HPV VACCINES  Aged Out    Health Maintenance  Health Maintenance Due  Topic Date Due   Zoster Vaccines- Shingrix (1 of 2) Never done   COVID-19 Vaccine (4 - Booster for Pfizer series) 03/27/2020   INFLUENZA VACCINE  11/04/2020    Colorectal cancer screening: No longer required.   Lung Cancer Screening: (Low Dose CT Chest recommended if Age 23-80 years, 30 pack-year currently smoking OR have quit w/in 15years.) does not qualify.   Lung Cancer Screening Referral: no  Additional Screening:  Hepatitis C Screening: does not qualify;   Vision Screening: Recommended annual ophthalmology exams for early detection of glaucoma and other disorders of the eye. Is the patient up to date with their annual eye exam?  Yes  Who is the provider or what is the name of the office in which the patient attends annual eye exams? [facility] If pt is not established with a provider, would they like to be referred to a provider to establish care? No .   Dental Screening: Recommended annual dental exams for proper oral hygiene  Community Resource Referral / Chronic Care Management: CRR required this visit?  No   CCM required this visit?  No      Plan:     I have personally reviewed and noted the following in the patient's chart:   Medical and social history Use of alcohol, tobacco or illicit  drugs  Current medications and supplements including opioid prescriptions. Patient is not currently taking opioid prescriptions. Functional ability and status Nutritional status Physical activity Advanced directives List of other physicians Hospitalizations, surgeries, and ER visits in previous 12 months Vitals Screenings to include cognitive, depression, and falls Referrals and appointments  In addition, I have reviewed and discussed with patient certain preventive protocols, quality metrics, and best practice recommendations. A written personalized care plan for preventive services as well as general preventive health recommendations were provided to patient.     Kellie Simmering, LPN   [id number]   Nurse Notes:

## 2020-12-26 ENCOUNTER — Encounter: Payer: Self-pay | Admitting: Cardiology

## 2020-12-26 ENCOUNTER — Ambulatory Visit (INDEPENDENT_AMBULATORY_CARE_PROVIDER_SITE_OTHER): Payer: No Typology Code available for payment source | Admitting: Cardiology

## 2020-12-26 ENCOUNTER — Other Ambulatory Visit: Payer: Self-pay

## 2020-12-26 VITALS — BP 136/76 | HR 97 | Ht 69.5 in | Wt 194.6 lb

## 2020-12-26 DIAGNOSIS — R55 Syncope and collapse: Secondary | ICD-10-CM

## 2020-12-26 DIAGNOSIS — R42 Dizziness and giddiness: Secondary | ICD-10-CM

## 2020-12-26 NOTE — Progress Notes (Signed)
Electrophysiology Office Follow up Visit Note:    Date:  12/26/2020   ID:  [name], DOB 07/05/1934, MRN [id number]  PCP:  Olin Hauser, DO  [facility] Cardiologist:  None  [facility] Electrophysiologist:  Vickie Epley, MD    Interval History:    Halil Rentz Sr. is a 86 y.o. male who presents for a follow up visit. They were last seen in clinic September 20, 2020 for dizziness.  During my appointment I thought the most likely etiology of his dizziness was orthostatic intolerance but given the concern for possible arrhythmic cause, patient presents today for loop recorder implant.  He has had no syncopal episodes since her last appointment.     Past Medical History:  Diagnosis Date   Dyslipidemia    GERD (gastroesophageal reflux disease)    Glaucoma    Hyperlipidemia    Hypertension    Insomnia    Myasthenia gravis ([place]) 09/08/2012   Obesity    Ocular myasthenia gravis ([place])    Prostate cancer ([place])     Past Surgical History:  Procedure Laterality Date   CATARACT EXTRACTION Bilateral    CHOLECYSTECTOMY     TRANSURETHRAL RESECTION OF PROSTATE      Current Medications: Current Meds  Medication Sig   acetaminophen (TYLENOL) 325 MG tablet Take 1 tablet (325 mg total) by mouth every 6 (six) hours as needed for mild pain (or Fever >/= 101).   apixaban (ELIQUIS) 5 MG TABS tablet Take 1 tablet (5 mg total) by mouth 2 (two) times daily.   atorvastatin (LIPITOR) 10 MG tablet Take 10 mg by mouth daily.   feeding supplement, ENSURE ENLIVE, (ENSURE ENLIVE) LIQD Take 237 mLs by mouth 2 (two) times daily between meals.   gabapentin (NEURONTIN) 100 MG capsule Start 1 capsule daily, increase by 1 cap every 2-3 days as tolerated up to 3 times a day, or may take 3 at once in evening.   latanoprost (XALATAN) 0.005 % ophthalmic solution Place 1 drop into both eyes at bedtime.   lisinopril (PRINIVIL,ZESTRIL) 40 MG tablet Take 0.5 tablets (20 mg total) by mouth  daily.   memantine (NAMENDA) 10 MG tablet Take 10 mg by mouth See admin instructions. Take one-half tablet at bedtime for 2 weeks, then take one tablet at bedtime for 2 weeks, then take one and one-half tablets at bedtime for 2 weeks, then take two tablets at bedtime to slow memory loss   methocarbamol (ROBAXIN) 500 MG tablet Take 500 mg by mouth in the morning, at noon, and at bedtime.   omeprazole (PRILOSEC) 40 MG capsule Take 1 capsule (40 mg total) by mouth 2 (two) times daily before a meal.   predniSONE (DELTASONE) 1 MG tablet Take 1 mg by mouth daily with breakfast.   predniSONE (DELTASONE) 5 MG tablet Take 5 mg by mouth daily.   senna-docusate (SENOKOT-S) 8.6-50 MG tablet Take 1 tablet by mouth at bedtime as needed for mild constipation.   Vitamins/Minerals TABS Take by mouth.   zolpidem (AMBIEN) 5 MG tablet Take 5 mg by mouth at bedtime as needed for sleep.     Allergies:   Patient has no known allergies.   Social History   Socioeconomic History   Marital status: Married    Spouse name: Crystal   Number of children: 3   Years of education: 16   Highest education level: Not on file  Occupational History    Comment: retired  Tobacco Use   Smoking  status: Former    Types: Cigarettes    Quit date: 04/06/1970    Years since quitting: 50.7   Smokeless tobacco: Former  Scientific laboratory technician Use: Never used  Substance and Sexual Activity   Alcohol use: Yes    Alcohol/week: 0.0 standard drinks    Comment: Consumes alcohol on occasion   Drug use: No   Sexual activity: Not on file  Other Topics Concern   Not on file  Social History Narrative   Patient lives at home with his wife Veterinary surgeon)   Retired - [employer]   [place]   Right handed.   Caffeine- four cups daily.            Social Determinants of Health   Financial Resource Strain: Low Risk    Difficulty of Paying Living Expenses: Not hard at all  Food Insecurity: No Food Insecurity   Worried About Sales executive in the Last Year: Never true   [place] in the Last Year: Never true  Transportation Needs: No Transportation Needs   Lack of Transportation (Medical): No   Lack of Transportation (Non-Medical): No  Physical Activity: Inactive   Days of Exercise per Week: 0 days   Minutes of Exercise per Session: 0 min  Stress: No Stress Concern Present   Feeling of Stress : Not at all  Social Connections: Not on file     Family History: The patient's family history includes Heart attack in his father.  ROS:   Please see the history of present illness.    All other systems reviewed and are negative.  EKGs/Labs/Other Studies Reviewed:    The following studies were reviewed today:   EKG:  The ekg ordered today demonstrates sinus rhythm, PAC  Recent Labs: No results found for requested labs within last 8760 hours.  Recent Lipid Panel No results found for: CHOL, TRIG, HDL, CHOLHDL, VLDL, LDLCALC, LDLDIRECT  Physical Exam:    VS:  BP 136/76   Pulse 97   Ht 5' 9.5" (1.765 m)   Wt 194 lb 9.6 oz (88.3 kg)   SpO2 93%   BMI 28.33 kg/m     Wt Readings from Last 3 Encounters:  12/26/20 194 lb 9.6 oz (88.3 kg)  12/17/20 192 lb (87.1 kg)  09/20/20 197 lb (89.4 kg)     GEN:  Well nourished, well developed in no acute distress HEENT: Normal NECK: No JVD; No carotid bruits LYMPHATICS: No lymphadenopathy CARDIAC: RRR, no murmurs, rubs, gallops RESPIRATORY:  Clear to auscultation without rales, wheezing or rhonchi  ABDOMEN: Soft, non-tender, non-distended MUSCULOSKELETAL:  No edema; No deformity  SKIN: Warm and dry NEUROLOGIC:  Alert and oriented x 3 PSYCHIATRIC:  Normal affect   ASSESSMENT:    1. Syncope, unspecified syncope type   2. Dizziness    PLAN:    In order of problems listed above:   #Dizziness No episodes of syncope since I last saw him.  EKG today shows normal sinus rhythm with a single PAC.  We discussed the loop recorder procedure in detail including the  risks and he wishes to proceed.    Total time spent with patient today 33 minutes. This includes reviewing records, evaluating the patient and coordinating care.   Medication Adjustments/Labs and Tests Ordered: Current medicines are reviewed at length with the patient today.  Concerns regarding medicines are outlined above.  Orders Placed This Encounter  Procedures   EKG 12-Lead   No orders of  the defined types were placed in this encounter.    Signed, Lars Mage, MD, [facility], [facility] 12/26/2020 9:34 PM    Electrophysiology [facility]    ----------------------------------------------------------------------------   SURGEON:  Lars Mage, MD    PREPROCEDURE DIAGNOSIS:  Syncope    POSTPROCEDURE DIAGNOSIS:  Syncope     PROCEDURES:   1. Implantable loop recorder implantation    INTRODUCTION:  Mr Ida is an 86 y.o. patient with syncope who presents today for implantable loop implantation.      DESCRIPTION OF PROCEDURE:  Informed written consent was obtained.  The patient required no sedation for the procedure today.   The patients left chest was therefore prepped and draped in the usual sterile fashion. The skin overlying the left parasternal region was infiltrated with lidocaine for local analgesia.  A 0.5-cm incision was made over the left parasternal region over the 3rd intercostal space.  A Medtronic Reveal Linq model [id number]) implantable loop recorder was then placed into the pocket  R waves were very prominent and measured >0.57mV.  Steri- Strips and a sterile dressing were then applied.  There were no early apparent complications.     CONCLUSIONS:   1. Successful implantation of a Medtronic Reveal LINQ implantable loop recorder for syncope.  2. No early apparent complications.   Lysbeth Galas T. Quentin Ore, MD, [facility] Cardiac Electrophysiology

## 2020-12-26 NOTE — Patient Instructions (Addendum)
Medication Instructions:  Your physician recommends that you continue on your current medications as directed. Please refer to the Current Medication list given to you today.  Labwork: None ordered.  Testing/Procedures: None ordered.  Follow-Up:  Your physician wants you to follow-up in: one year with Dr. Quentin Ore.  You will receive a reminder letter in the mail two months in advance. If you don't receive a letter, please call our office to schedule the follow-up appointment.    Implantable Loop Recorder Placement, Care After This sheet gives you information about how to care for yourself after your procedure. Your health care provider may also give you more specific instructions. If you have problems or questions, contact your health care provider. What can I expect after the procedure? After the procedure, it is common to have: Soreness or discomfort near the incision. Some swelling or bruising near the incision.  Follow these instructions at home: Incision care   Leave your outer dressing on for 72 hours.  After 72 hours you can remove your outer dressing and shower. Leave adhesive strips in place. These skin closures may need to stay in place for 1-2 weeks. If adhesive strip edges start to loosen and curl up, you may trim the loose edges.  You may remove the strips if they have not fallen off after 2 weeks. Check your incision area every day for signs of infection. Check for: Redness, swelling, or pain. Fluid or blood. Warmth. Pus or a bad smell. Do not take baths, swim, or use a hot tub until your incision is completely healed. If your wound site starts to bleed apply pressure.      If you have any questions/concerns please call the device clinic at [phone].  Activity  Return to your normal activities.  General instructions Follow instructions from your health care provider about how to manage your implantable loop recorder and transmit the information. Learn how to  activate a recording if this is necessary for your type of device. Do not go through a metal detection gate, and do not let someone hold a metal detector over your chest. Show your ID card. Do not have an MRI unless you check with your health care provider first. Take over-the-counter and prescription medicines only as told by your health care provider. Keep all follow-up visits as told by your health care provider. This is important. Contact a health care provider if: You have redness, swelling, or pain around your incision. You have a fever. You have pain that is not relieved by your pain medicine. You have triggered your device because of fainting (syncope) or because of a heartbeat that feels like it is racing, slow, fluttering, or skipping (palpitations). Get help right away if you have: Chest pain. Difficulty breathing. Summary After the procedure, it is common to have soreness or discomfort near the incision. Change your dressing as told by your health care provider. Follow instructions from your health care provider about how to manage your implantable loop recorder and transmit the information. Keep all follow-up visits as told by your health care provider. This is important. This information is not intended to replace advice given to you by your health care provider. Make sure you discuss any questions you have with your health care provider. Document Released: 03/04/2015 Document Revised: 05/08/2017 Document Reviewed: 05/08/2017 Elsevier Patient Education  2020 [employer].

## 2021-01-31 ENCOUNTER — Telehealth: Payer: Self-pay

## 2021-01-31 NOTE — Telephone Encounter (Signed)
I spoke with the patient wife Chrystal and she confirmed that the patient wanted to be released the [facility]

## 2021-03-22 ENCOUNTER — Emergency Department: Payer: No Typology Code available for payment source

## 2021-03-22 ENCOUNTER — Inpatient Hospital Stay
Admission: EM | Admit: 2021-03-22 | Discharge: 2021-03-28 | DRG: 177 | Disposition: A | Discharge status: Home Health | Payer: No Typology Code available for payment source | Attending: Internal Medicine | Admitting: Internal Medicine

## 2021-03-22 ENCOUNTER — Other Ambulatory Visit: Payer: Self-pay

## 2021-03-22 ENCOUNTER — Encounter: Payer: Self-pay | Admitting: Emergency Medicine

## 2021-03-22 DIAGNOSIS — G7001 Myasthenia gravis with (acute) exacerbation: Secondary | ICD-10-CM | POA: Diagnosis present

## 2021-03-22 DIAGNOSIS — G4733 Obstructive sleep apnea (adult) (pediatric): Secondary | ICD-10-CM | POA: Diagnosis present

## 2021-03-22 DIAGNOSIS — U071 COVID-19: Principal | ICD-10-CM | POA: Diagnosis present

## 2021-03-22 DIAGNOSIS — Z8546 Personal history of malignant neoplasm of prostate: Secondary | ICD-10-CM

## 2021-03-22 DIAGNOSIS — R319 Hematuria, unspecified: Secondary | ICD-10-CM | POA: Diagnosis present

## 2021-03-22 DIAGNOSIS — Z9049 Acquired absence of other specified parts of digestive tract: Secondary | ICD-10-CM

## 2021-03-22 DIAGNOSIS — Z86711 Personal history of pulmonary embolism: Secondary | ICD-10-CM

## 2021-03-22 DIAGNOSIS — Z87891 Personal history of nicotine dependence: Secondary | ICD-10-CM

## 2021-03-22 DIAGNOSIS — E785 Hyperlipidemia, unspecified: Secondary | ICD-10-CM | POA: Diagnosis present

## 2021-03-22 DIAGNOSIS — I480 Paroxysmal atrial fibrillation: Secondary | ICD-10-CM | POA: Diagnosis present

## 2021-03-22 DIAGNOSIS — I10 Essential (primary) hypertension: Secondary | ICD-10-CM | POA: Diagnosis not present

## 2021-03-22 DIAGNOSIS — H409 Unspecified glaucoma: Secondary | ICD-10-CM | POA: Diagnosis present

## 2021-03-22 DIAGNOSIS — G4700 Insomnia, unspecified: Secondary | ICD-10-CM | POA: Diagnosis present

## 2021-03-22 DIAGNOSIS — Z7952 Long term (current) use of systemic steroids: Secondary | ICD-10-CM

## 2021-03-22 DIAGNOSIS — E669 Obesity, unspecified: Secondary | ICD-10-CM | POA: Diagnosis present

## 2021-03-22 DIAGNOSIS — E782 Mixed hyperlipidemia: Secondary | ICD-10-CM

## 2021-03-22 DIAGNOSIS — J1282 Pneumonia due to coronavirus disease 2019: Secondary | ICD-10-CM | POA: Diagnosis not present

## 2021-03-22 DIAGNOSIS — R778 Other specified abnormalities of plasma proteins: Secondary | ICD-10-CM | POA: Diagnosis present

## 2021-03-22 DIAGNOSIS — R55 Syncope and collapse: Secondary | ICD-10-CM | POA: Diagnosis not present

## 2021-03-22 DIAGNOSIS — N1831 Chronic kidney disease, stage 3a: Secondary | ICD-10-CM | POA: Diagnosis present

## 2021-03-22 DIAGNOSIS — K219 Gastro-esophageal reflux disease without esophagitis: Secondary | ICD-10-CM | POA: Diagnosis not present

## 2021-03-22 DIAGNOSIS — Z79899 Other long term (current) drug therapy: Secondary | ICD-10-CM

## 2021-03-22 DIAGNOSIS — F0390 Unspecified dementia without behavioral disturbance: Secondary | ICD-10-CM

## 2021-03-22 DIAGNOSIS — I951 Orthostatic hypotension: Secondary | ICD-10-CM

## 2021-03-22 DIAGNOSIS — F03918 Unspecified dementia, unspecified severity, with other behavioral disturbance: Secondary | ICD-10-CM | POA: Diagnosis present

## 2021-03-22 DIAGNOSIS — R339 Retention of urine, unspecified: Secondary | ICD-10-CM | POA: Diagnosis not present

## 2021-03-22 DIAGNOSIS — Z7901 Long term (current) use of anticoagulants: Secondary | ICD-10-CM

## 2021-03-22 DIAGNOSIS — R42 Dizziness and giddiness: Secondary | ICD-10-CM

## 2021-03-22 DIAGNOSIS — Z888 Allergy status to other drugs, medicaments and biological substances status: Secondary | ICD-10-CM

## 2021-03-22 DIAGNOSIS — R29818 Other symptoms and signs involving the nervous system: Secondary | ICD-10-CM

## 2021-03-22 DIAGNOSIS — I129 Hypertensive chronic kidney disease with stage 1 through stage 4 chronic kidney disease, or unspecified chronic kidney disease: Secondary | ICD-10-CM | POA: Diagnosis present

## 2021-03-22 DIAGNOSIS — I472 Ventricular tachycardia, unspecified: Secondary | ICD-10-CM | POA: Diagnosis present

## 2021-03-22 DIAGNOSIS — I441 Atrioventricular block, second degree: Secondary | ICD-10-CM | POA: Diagnosis present

## 2021-03-22 DIAGNOSIS — Z8249 Family history of ischemic heart disease and other diseases of the circulatory system: Secondary | ICD-10-CM

## 2021-03-22 DIAGNOSIS — I495 Sick sinus syndrome: Secondary | ICD-10-CM | POA: Diagnosis present

## 2021-03-22 HISTORY — DX: Atrioventricular block, second degree: I44.1

## 2021-03-22 HISTORY — DX: Personal history of other medical treatment: Z92.89

## 2021-03-22 HISTORY — DX: Syncope and collapse: R55

## 2021-03-22 LAB — CBC WITH DIFFERENTIAL/PLATELET
Abs Immature Granulocytes: 0.04 10*3/uL (ref 0.00–0.07)
Basophils Absolute: 0 10*3/uL (ref 0.0–0.1)
Basophils Relative: 0 %
Eosinophils Absolute: 0 10*3/uL (ref 0.0–0.5)
Eosinophils Relative: 0 %
HCT: 44.9 % (ref 39.0–52.0)
Hemoglobin: 15.2 g/dL (ref 13.0–17.0)
Immature Granulocytes: 0 %
Lymphocytes Relative: 8 %
Lymphs Abs: 0.9 10*3/uL (ref 0.7–4.0)
MCH: 32.3 pg (ref 26.0–34.0)
MCHC: 33.9 g/dL (ref 30.0–36.0)
MCV: 95.3 fL (ref 80.0–100.0)
Monocytes Absolute: 0.9 10*3/uL (ref 0.1–1.0)
Monocytes Relative: 8 %
Neutro Abs: 9 10*3/uL — ABNORMAL HIGH (ref 1.7–7.7)
Neutrophils Relative %: 84 %
Platelets: 180 10*3/uL (ref 150–400)
RBC: 4.71 MIL/uL (ref 4.22–5.81)
RDW: 12.9 % (ref 11.5–15.5)
WBC: 10.9 10*3/uL — ABNORMAL HIGH (ref 4.0–10.5)
nRBC: 0 % (ref 0.0–0.2)

## 2021-03-22 LAB — URINALYSIS, ROUTINE W REFLEX MICROSCOPIC
Bacteria, UA: NONE SEEN
Bilirubin Urine: NEGATIVE
Glucose, UA: NEGATIVE mg/dL
Ketones, ur: 20 mg/dL — AB
Leukocytes,Ua: NEGATIVE
Nitrite: NEGATIVE
Protein, ur: 100 mg/dL — AB
Specific Gravity, Urine: 1.024 (ref 1.005–1.030)
pH: 5 (ref 5.0–8.0)

## 2021-03-22 LAB — COMPREHENSIVE METABOLIC PANEL
ALT: 19 U/L (ref 0–44)
AST: 23 U/L (ref 15–41)
Albumin: 3.7 g/dL (ref 3.5–5.0)
Alkaline Phosphatase: 64 U/L (ref 38–126)
Anion gap: 8 (ref 5–15)
BUN: 22 mg/dL (ref 8–23)
CO2: 25 mmol/L (ref 22–32)
Calcium: 9 mg/dL (ref 8.9–10.3)
Chloride: 100 mmol/L (ref 98–111)
Creatinine, Ser: 1.54 mg/dL — ABNORMAL HIGH (ref 0.61–1.24)
GFR, Estimated: 44 mL/min — ABNORMAL LOW (ref >60)
Glucose, Bld: 114 mg/dL — ABNORMAL HIGH (ref 70–99)
Potassium: 4.2 mmol/L (ref 3.5–5.1)
Sodium: 133 mmol/L — ABNORMAL LOW (ref 135–145)
Total Bilirubin: 1.6 mg/dL — ABNORMAL HIGH (ref 0.3–1.2)
Total Protein: 7 g/dL (ref 6.5–8.1)

## 2021-03-22 LAB — TROPONIN I (HIGH SENSITIVITY): Troponin I (High Sensitivity): 497 ng/L (ref <18)

## 2021-03-22 LAB — PROTIME-INR
INR: 1.3 — ABNORMAL HIGH (ref 0.8–1.2)
Prothrombin Time: 15.7 seconds — ABNORMAL HIGH (ref 11.4–15.2)

## 2021-03-22 LAB — LACTIC ACID, PLASMA
Lactic Acid, Venous: 1.4 mmol/L (ref 0.5–1.9)
Lactic Acid, Venous: 1.5 mmol/L (ref 0.5–1.9)

## 2021-03-22 LAB — RESP PANEL BY RT-PCR (FLU A&B, COVID) ARPGX2
Influenza A by PCR: NEGATIVE
Influenza B by PCR: NEGATIVE
SARS Coronavirus 2 by RT PCR: POSITIVE — AB

## 2021-03-22 LAB — BRAIN NATRIURETIC PEPTIDE: B Natriuretic Peptide: 100.7 pg/mL — ABNORMAL HIGH (ref 0.0–100.0)

## 2021-03-22 LAB — CK: Total CK: 537 U/L — ABNORMAL HIGH (ref 49–397)

## 2021-03-22 MED ORDER — ASPIRIN 81 MG PO CHEW
324.0000 mg | CHEWABLE_TABLET | Freq: Once | ORAL | Status: AC
  Administered 2021-03-22: 324 mg via ORAL
  Filled 2021-03-22: qty 4

## 2021-03-22 MED ORDER — APIXABAN 5 MG PO TABS
5.0000 mg | ORAL_TABLET | Freq: Two times a day (BID) | ORAL | Status: DC
  Administered 2021-03-22: 5 mg via ORAL
  Filled 2021-03-22: qty 1

## 2021-03-22 MED ORDER — LATANOPROST 0.005 % OP SOLN
1.0000 [drp] | Freq: Every day | OPHTHALMIC | Status: DC
  Administered 2021-03-23 – 2021-03-27 (×5): 1 [drp] via OPHTHALMIC
  Filled 2021-03-22: qty 2.5

## 2021-03-22 MED ORDER — SODIUM CHLORIDE 0.9 % IV SOLN
2.0000 g | INTRAVENOUS | Status: DC
  Administered 2021-03-22 – 2021-03-23 (×2): 2 g via INTRAVENOUS
  Filled 2021-03-22 (×2): qty 20

## 2021-03-22 MED ORDER — SODIUM CHLORIDE 0.9 % IV SOLN
100.0000 mg | Freq: Every day | INTRAVENOUS | Status: DC
  Filled 2021-03-22: qty 20

## 2021-03-22 MED ORDER — ACETAMINOPHEN 650 MG RE SUPP
650.0000 mg | Freq: Four times a day (QID) | RECTAL | Status: AC | PRN
  Filled 2021-03-22: qty 1

## 2021-03-22 MED ORDER — ONDANSETRON HCL 4 MG PO TABS: 4.0000 mg | ORAL_TABLET | Freq: Four times a day (QID) | ORAL | Status: DC | PRN

## 2021-03-22 MED ORDER — LISINOPRIL 20 MG PO TABS
20.0000 mg | ORAL_TABLET | Freq: Every day | ORAL | Status: DC
  Administered 2021-03-23 – 2021-03-27 (×4): 20 mg via ORAL
  Filled 2021-03-22: qty 2
  Filled 2021-03-22: qty 1
  Filled 2021-03-22: qty 2
  Filled 2021-03-22 (×2): qty 1

## 2021-03-22 MED ORDER — DONEPEZIL HCL 5 MG PO TABS
10.0000 mg | ORAL_TABLET | Freq: Every day | ORAL | Status: DC
  Administered 2021-03-23 – 2021-03-27 (×5): 10 mg via ORAL
  Filled 2021-03-22 (×6): qty 2

## 2021-03-22 MED ORDER — HEPARIN (PORCINE) 25000 UT/250ML-% IV SOLN
1050.0000 [IU]/h | INTRAVENOUS | Status: DC
  Administered 2021-03-23: 01:00:00 1150 [IU]/h via INTRAVENOUS
  Filled 2021-03-22: qty 250

## 2021-03-22 MED ORDER — MEMANTINE HCL 5 MG PO TABS
10.0000 mg | ORAL_TABLET | Freq: Two times a day (BID) | ORAL | Status: DC
  Administered 2021-03-23 – 2021-03-28 (×11): 10 mg via ORAL
  Filled 2021-03-22 (×11): qty 2

## 2021-03-22 MED ORDER — SODIUM CHLORIDE 0.9 % IV BOLUS
500.0000 mL | Freq: Once | INTRAVENOUS | Status: AC
  Administered 2021-03-22: 500 mL via INTRAVENOUS

## 2021-03-22 MED ORDER — SENNOSIDES-DOCUSATE SODIUM 8.6-50 MG PO TABS: 1.0000 | ORAL_TABLET | Freq: Every evening | ORAL | Status: DC | PRN

## 2021-03-22 MED ORDER — PYRIDOSTIGMINE BROMIDE 60 MG PO TABS
60.0000 mg | ORAL_TABLET | Freq: Two times a day (BID) | ORAL | Status: DC
  Administered 2021-03-23 – 2021-03-27 (×10): 60 mg via ORAL
  Filled 2021-03-22 (×12): qty 1

## 2021-03-22 MED ORDER — ACETAMINOPHEN 325 MG PO TABS
650.0000 mg | ORAL_TABLET | Freq: Four times a day (QID) | ORAL | Status: AC | PRN
  Administered 2021-03-23: 12:00:00 650 mg via ORAL
  Filled 2021-03-22: qty 2

## 2021-03-22 MED ORDER — ACETAMINOPHEN 325 MG PO TABS
650.0000 mg | ORAL_TABLET | Freq: Once | ORAL | Status: AC
  Administered 2021-03-22: 650 mg via ORAL
  Filled 2021-03-22: qty 2

## 2021-03-22 MED ORDER — PANTOPRAZOLE SODIUM 40 MG PO TBEC
80.0000 mg | DELAYED_RELEASE_TABLET | Freq: Every day | ORAL | Status: DC
  Administered 2021-03-23 – 2021-03-28 (×6): 80 mg via ORAL
  Filled 2021-03-22 (×6): qty 2

## 2021-03-22 MED ORDER — ATORVASTATIN CALCIUM 20 MG PO TABS
10.0000 mg | ORAL_TABLET | Freq: Every day | ORAL | Status: DC
  Administered 2021-03-23: 10:00:00 10 mg via ORAL
  Filled 2021-03-22: qty 1

## 2021-03-22 MED ORDER — ONDANSETRON HCL 4 MG/2ML IJ SOLN: 4.0000 mg | Freq: Four times a day (QID) | INTRAMUSCULAR | Status: DC | PRN

## 2021-03-22 MED ORDER — ZOLPIDEM TARTRATE 5 MG PO TABS
5.0000 mg | ORAL_TABLET | Freq: Every evening | ORAL | Status: DC | PRN
  Administered 2021-03-25 – 2021-03-27 (×3): 5 mg via ORAL
  Filled 2021-03-22 (×3): qty 1

## 2021-03-22 MED ORDER — SODIUM CHLORIDE 0.9 % IV SOLN
200.0000 mg | Freq: Once | INTRAVENOUS | Status: AC
  Administered 2021-03-22: 200 mg via INTRAVENOUS
  Filled 2021-03-22: qty 200

## 2021-03-22 MED ORDER — ENSURE ENLIVE PO LIQD
237.0000 mL | Freq: Two times a day (BID) | ORAL | Status: DC
  Administered 2021-03-24 – 2021-03-25 (×2): 237 mL via ORAL

## 2021-03-22 NOTE — Progress Notes (Signed)
ANTICOAGULATION CONSULT NOTE  Pharmacy Consult for heparin infusion Indication: NSTEMI  Allergies  Allergen Reactions   Pravastatin Other (See Comments)   Simvastatin Other (See Comments)    Patient Measurements: Height: 5\' 9"  (175.3 cm) Weight: 89 kg (196 lb 3.4 oz) IBW/kg (Calculated) : 70.7 Heparin Dosing Weight: 88.6 kg  Vital Signs: Temp: 99.6 F (37.6 C) (12/17 2209) Temp Source: Oral (12/17 2209) BP: 135/68 (12/17 2315) Pulse Rate: 98 (12/17 2315)  Labs: Recent Labs    03/22/21 1432 03/22/21 2052  HGB 15.2  --   HCT 44.9  --   PLT 180  --   LABPROT 15.7*  --   INR 1.3*  --   CREATININE 1.54*  --   CKTOTAL  --  537*  TROPONINIHS  --  497*    Estimated Creatinine Clearance: 39.4 mL/min (A) (by C-G formula based on SCr of 1.54 mg/dL (H)).   Medical History: Past Medical History:  Diagnosis Date   Dyslipidemia    GERD (gastroesophageal reflux disease)    Glaucoma    Hyperlipidemia    Hypertension    Insomnia    Myasthenia gravis ([place]) 09/08/2012   Obesity    Ocular myasthenia gravis ([place])    Prostate cancer ([place])     Medications:  PTA Med:  Apixaban 5 mg BID, dose given 12/17 @ 2312  Assessment: Pt is 86 yo male presenting to ED due to syncope event found with elevated Troponin I.  Goal of Therapy:  Heparin level 0.3-0.7 units/ml aPTT 66-102 seconds Monitor platelets by anticoagulation protocol: Yes   Plan:  No bolus due to recent DOAC administration Start heparin infusion at 1150 units/hr Will follow aPTT until correlation with HL is confirmed. Recheck aPTT in 8 hours after start of infusion. HL and CBC daily while on heparin.  Renda Rolls, PharmD, [facility] 03/22/2021 11:35 PM

## 2021-03-22 NOTE — ED Triage Notes (Signed)
Pt in via EMS from home with c/o syncopal episode several hours ago while trying to get out of bed. Pt c/o weakness. CBG 139, 112/82, HR 100, 97% RA

## 2021-03-22 NOTE — Progress Notes (Signed)
Remdesivir - Pharmacy Brief Note   O:  ALT: 19 CXR: No active disease SpO2: 95% on RA   A/P:  Remdesivir 200 mg IVPB once followed by 100 mg IVPB daily x 4 days.   Sherilyn Banker, PharmD Clinical Pharmacist  03/22/2021 10:30 PM

## 2021-03-22 NOTE — ED Provider Notes (Addendum)
[facility] Emergency Department Provider Note    Event Date/Time   First MD Initiated Contact with Patient 03/22/21 2031     (approximate)  I have reviewed the triage vital signs and the nursing notes.   HISTORY  Chief Complaint Weakness    HPI Elijah Bromwell Sr. is a 86 y.o. male with a history of myasthenia as well as GERD presents to the ER for evaluation of weakness and syncope occurred this morning states he was trying to get out passed out was on the ground for quite some time.  Feels very weak and is not able to stand.  Has been having coughing fits over the past 24 hours since he came home from work yesterday.  Denies any nausea vomiting or diarrhea.  Past Medical History:  Diagnosis Date   Dyslipidemia    GERD (gastroesophageal reflux disease)    Glaucoma    Hyperlipidemia    Hypertension    Insomnia    Myasthenia gravis ([place]) 09/08/2012   Obesity    Ocular myasthenia gravis ([place])    Prostate cancer ([place])    Family History  Problem Relation Age of Onset   Heart attack Father    Past Surgical History:  Procedure Laterality Date   CATARACT EXTRACTION Bilateral    CHOLECYSTECTOMY     TRANSURETHRAL RESECTION OF PROSTATE     Patient Active Problem List   Diagnosis Date Noted   Syncope 03/31/2018   Left-sided headache 01/25/2017   OSA (obstructive sleep apnea) 12/24/2016   Diarrhea 12/24/2016   Squamous cell carcinoma 09/07/2016   Cavitating mass of lung 04/17/2016   Gastritis 04/17/2016   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 Authorizing Provider  acetaminophen (TYLENOL) 325 MG tablet Take 1 tablet (325 mg total) by mouth every 6 (six) hours as needed for mild pain (or Fever >/= 101). 01/10/17  Yes Gouru, Illene Silver, MD  apixaban (ELIQUIS) 5 MG TABS tablet Take 1 tablet (5 mg total) by mouth 2 (two) times daily. 02/24/17  Yes Karamalegos, Devonne Doughty, DO  atorvastatin (LIPITOR) 10 MG tablet Take 10 mg by mouth daily.   Yes [provider]  donepezil (ARICEPT) 10 MG tablet TAKE ONE TABLET BY MOUTH AT BEDTIME TO SLOW MEMORY LOSS (NOTE CHANGE IN DIRECTIONS AND TABLET STRENGTH) THIS IS NOT A DOSE CHANGE 12/27/20  Yes [provider]  feeding supplement, ENSURE ENLIVE, (ENSURE ENLIVE) LIQD Take 237 mLs by mouth 2 (two) times daily between meals. 01/10/17  Yes Gouru, Aruna, MD  latanoprost (XALATAN) 0.005 % ophthalmic solution Place 1 drop into both eyes at bedtime.   Yes [provider]  lisinopril (PRINIVIL,ZESTRIL) 40 MG tablet Take 0.5 tablets (20 mg total) by mouth daily. 01/10/17  Yes Gouru, Aruna, MD  memantine (NAMENDA) 10 MG tablet TAKE TWO TABLETS BY MOUTH AT BEDTIME TO SLOW MEMORY LOSS 12/27/20  Yes [provider]  methocarbamol (ROBAXIN) 500 MG tablet Take 500 mg by mouth in the morning, at noon, and at bedtime. 04/16/20  Yes [provider]  omeprazole (PRILOSEC) 40 MG capsule Take 1 capsule (40 mg total) by mouth  2 (two) times daily before a meal. 07/24/19  Yes Karamalegos, Devonne Doughty, DO  predniSONE (DELTASONE) 1 MG tablet Take 2 mg by mouth daily with breakfast.   Yes [provider]  predniSONE (DELTASONE) 5 MG tablet Take 5 mg by mouth daily. 05/15/14  Yes [provider]  pyridostigmine (MESTINON) 60 MG tablet TAKE ONE TABLET BY MOUTH TWICE A DAY - TAKE AT 1PM AND 5PM 12/27/20  Yes [provider]  senna-docusate (SENOKOT-S) 8.6-50 MG tablet Take 1 tablet by mouth at bedtime as needed for mild constipation. 01/10/17  Yes Gouru, Illene Silver, MD   Vitamins/Minerals TABS Take 1 tablet by mouth daily.   Yes [provider]  gabapentin (NEURONTIN) 100 MG capsule Start 1 capsule daily, increase by 1 cap every 2-3 days as tolerated up to 3 times a day, or may take 3 at once in evening. Patient not taking: Reported on 03/22/2021 12/13/19   Olin Hauser, DO  zolpidem (AMBIEN) 5 MG tablet Take 5 mg by mouth at bedtime as needed for sleep.    [provider]    Allergies Pravastatin and Simvastatin    Social History Social History   Tobacco Use   Smoking status: Former    Types: Cigarettes    Quit date: 04/06/1970    Years since quitting: 50.9   Smokeless tobacco: Former  Scientific laboratory technician Use: Never used  Substance Use Topics   Alcohol use: Yes    Alcohol/week: 0.0 standard drinks    Comment: Consumes alcohol on occasion   Drug use: No    Review of Systems Patient denies headaches, rhinorrhea, blurry vision, numbness, shortness of breath, chest pain, edema, cough, abdominal pain, nausea, vomiting, diarrhea, dysuria, fevers, rashes or hallucinations unless otherwise stated above in HPI. ____________________________________________   PHYSICAL EXAM:  VITAL SIGNS: Vitals:   03/22/21 2300 03/22/21 2315  BP: (!) 104/59 135/68  Pulse: 98 98  Resp: (!) 26 (!) 24  Temp:    SpO2: 92% 94%    Constitutional: Alert and oriented. Warm to touch Eyes: Conjunctivae are normal.  Head: Atraumatic. Nose: No congestion/rhinnorhea. Mouth/Throat: Mucous membranes are moist.   Neck: No stridor. Painless ROM.  Cardiovascular: Normal rate, regular rhythm. Grossly normal heart sounds.  Good peripheral circulation. Respiratory: Normal respiratory effort.  No retractions. Lungs CTAB. Gastrointestinal: Soft and nontender. No distention. No abdominal bruits. No CVA tenderness. Genitourinary:  Musculoskeletal: No lower extremity tenderness nor edema.  No joint effusions. Neurologic:  Normal speech and language.  No gross focal neurologic deficits are appreciated. No facial droop Skin:  Skin is warm, dry and intact. No rash noted. Psychiatric: Mood and affect are normal. Speech and behavior are normal.  ____________________________________________   LABS (all labs ordered are listed, but only abnormal results are displayed)  Results for orders placed or performed during the hospital encounter of 03/22/21 (from the past 24 hour(s))  Comprehensive metabolic panel     Status: Abnormal   Collection Time: 03/22/21  2:32 PM  Result Value Ref Range   Sodium 133 (L) 135 - 145 mmol/L   Potassium 4.2 3.5 - 5.1 mmol/L   Chloride 100 98 - 111 mmol/L   CO2 25 22 - 32 mmol/L   Glucose, Bld 114 (H) 70 - 99 mg/dL   BUN 22 8 - 23 mg/dL   Creatinine, Ser 1.54 (H) 0.61 - 1.24 mg/dL   Calcium 9.0 8.9 - 10.3 mg/dL   Total Protein 7.0 6.5 - 8.1 g/dL  Albumin 3.7 3.5 - 5.0 g/dL   AST 23 15 - 41 U/L   ALT 19 0 - 44 U/L   Alkaline Phosphatase 64 38 - 126 U/L   Total Bilirubin 1.6 (H) 0.3 - 1.2 mg/dL   GFR, Estimated 44 (L) >60 mL/min   Anion gap 8 5 - 15  CBC with Differential     Status: Abnormal   Collection Time: 03/22/21  2:32 PM  Result Value Ref Range   WBC 10.9 (H) 4.0 - 10.5 K/uL   RBC 4.71 4.22 - 5.81 MIL/uL   Hemoglobin 15.2 13.0 - 17.0 g/dL   HCT 44.9 39.0 - 52.0 %   MCV 95.3 80.0 - 100.0 fL   MCH 32.3 26.0 - 34.0 pg   MCHC 33.9 30.0 - 36.0 g/dL   RDW 12.9 11.5 - 15.5 %   Platelets 180 150 - 400 K/uL   nRBC 0.0 0.0 - 0.2 %   Neutrophils Relative % 84 %   Neutro Abs 9.0 (H) 1.7 - 7.7 K/uL   Lymphocytes Relative 8 %   Lymphs Abs 0.9 0.7 - 4.0 K/uL   Monocytes Relative 8 %   Monocytes Absolute 0.9 0.1 - 1.0 K/uL   Eosinophils Relative 0 %   Eosinophils Absolute 0.0 0.0 - 0.5 K/uL   Basophils Relative 0 %   Basophils Absolute 0.0 0.0 - 0.1 K/uL   Immature Granulocytes 0 %   Abs Immature Granulocytes 0.04 0.00 - 0.07 K/uL  Protime-INR     Status: Abnormal   Collection Time: 03/22/21  2:32  PM  Result Value Ref Range   Prothrombin Time 15.7 (H) 11.4 - 15.2 seconds   INR 1.3 (H) 0.8 - 1.2  Urinalysis, Routine w reflex microscopic Nasopharyngeal Swab     Status: Abnormal   Collection Time: 03/22/21  7:54 PM  Result Value Ref Range   Color, Urine AMBER (A) YELLOW   APPearance HAZY (A) CLEAR   Specific Gravity, Urine 1.024 1.005 - 1.030   pH 5.0 5.0 - 8.0   Glucose, UA NEGATIVE NEGATIVE mg/dL   Hgb urine dipstick LARGE (A) NEGATIVE   Bilirubin Urine NEGATIVE NEGATIVE   Ketones, ur 20 (A) NEGATIVE mg/dL   Protein, ur 100 (A) NEGATIVE mg/dL   Nitrite NEGATIVE NEGATIVE   Leukocytes,Ua NEGATIVE NEGATIVE   RBC / HPF 21-50 0 - 5 RBC/hpf   WBC, UA 0-5 0 - 5 WBC/hpf   Bacteria, UA NONE SEEN NONE SEEN   Squamous Epithelial / LPF 0-5 0 - 5   Mucus PRESENT   Resp Panel by RT-PCR (Flu A&B, Covid) Nasopharyngeal Swab     Status: Abnormal   Collection Time: 03/22/21  8:52 PM   Specimen: Nasopharyngeal Swab; Nasopharyngeal(NP) swabs in vial transport medium  Result Value Ref Range   SARS Coronavirus 2 by RT PCR POSITIVE (A) NEGATIVE   Influenza A by PCR NEGATIVE NEGATIVE   Influenza B by PCR NEGATIVE NEGATIVE  Lactic acid, plasma     Status: None   Collection Time: 03/22/21  8:52 PM  Result Value Ref Range   Lactic Acid, Venous 1.4 0.5 - 1.9 mmol/L  Troponin I (High Sensitivity)     Status: Abnormal   Collection Time: 03/22/21  8:52 PM  Result Value Ref Range   Troponin I (High Sensitivity) 497 (HH) <18 ng/L  CK     Status: Abnormal   Collection Time: 03/22/21  8:52 PM  Result Value Ref Range   Total CK 537 (H)  49 - 397 U/L  Lactic acid, plasma     Status: None   Collection Time: 03/22/21 10:43 PM  Result Value Ref Range   Lactic Acid, Venous 1.5 0.5 - 1.9 mmol/L   ____________________________________________  EKG My review and personal interpretation at Time: 14:26   Indication: syncope  Rate: 95  Rhythm: afib Axis: normal Other: no stemi, no  depressions ____________________________________________  RADIOLOGY  I personally reviewed all radiographic images ordered to evaluate for the above acute complaints and reviewed radiology reports and findings.  These findings were personally discussed with the patient.  Please see medical record for radiology report.  ____________________________________________   PROCEDURES  Procedure(s) performed:  Procedures    Critical Care performed: no ____________________________________________   INITIAL IMPRESSION / ASSESSMENT AND PLAN / ED COURSE  Pertinent labs & imaging results that were available during my care of the patient were reviewed by me and considered in my medical decision making (see chart for details).   DDX: sepsis, pna, covid, flu, sepsis, uti, cva, sdh  Elijah Blau Sr. is a 86 y.o. who presents to the ED with symptoms as described above.  Patient frail-appearing but nontoxic reported significant weakness.  Blood work is reassuring the patient is COVID-positive.  Does have nonproductive cough.  Given age risk factors and weakness with syncopal episode today we will discussed the hospitalist for observation we will start IV remdesivir.  Patient agreeable to plan.  Clinical Course as of 03/22/21 2320  Sat Mar 22, 2021  2320 Elevated troponin noted also mildly elevated CK noted.  Patient is pain-free at this time he is already on Eliquis we will give aspirin.  We will continue to observe. [PR]    Clinical Course User Index [PR] Merlyn Lot, MD    The patient was evaluated in Emergency Department today for the symptoms described in the history of present illness. He/she was evaluated in the context of the global COVID-19 pandemic, which necessitated consideration that the patient might be at risk for infection with the SARS-CoV-2 virus that causes COVID-19. Institutional protocols and algorithms that pertain to the evaluation of patients at risk for COVID-19 are in  a state of rapid change based on information released by regulatory bodies including the CDC and federal and state organizations. These policies and algorithms were followed during the patient's care in the ED.  As part of my medical decision making, I reviewed the following data within the [facility] notes reviewed and incorporated, Labs reviewed, notes from prior ED visits and [place] Controlled Substance Database   ____________________________________________   FINAL CLINICAL IMPRESSION(S) / ED DIAGNOSES  Final diagnoses:  Syncope and collapse  COVID-19      NEW MEDICATIONS STARTED DURING THIS VISIT:  New Prescriptions   No medications on file     Note:  This document was prepared using Dragon voice recognition software and may include unintentional dictation errors.    Merlyn Lot, MD 03/22/21 Janelle Floor    Merlyn Lot, MD 03/22/21 [phone]

## 2021-03-22 NOTE — ED Notes (Signed)
Pt taken to 5h via recliner, primary RN preferred pt to stay in recliner at this time.

## 2021-03-22 NOTE — ED Notes (Signed)
Wife pacing in the hall; slamming hand on the counter; facial grimace; tense body posture; glaring at staff; RN Charge April; Security staff notified

## 2021-03-22 NOTE — ED Triage Notes (Signed)
Pt reports he passed out and does not recall anything from the fall. EMS reports pt fell and wife waiting 1.5 hours before calling EMS. Pt confused in triage, no family present, unable to determine pt baseline.

## 2021-03-22 NOTE — ED Provider Notes (Signed)
°  Emergency Medicine Provider Triage Evaluation Note  Elijah Brothers Sr. , a 86 y.o.male,  was evaluated in triage.  Pt complains of weakness/syncope.  Patient states that he passed out this morning and fell off his bed.  He is unsure if he hit his head or not.  This was an unwitnessed fall.  He states that he currently feels weak, but overall well.  Denies headache, cough, chest pain, abdominal pain, or urinary symptoms.   Review of Systems  Positive: Weakness, syncope Negative: Denies fever, chest pain, vomiting  Physical Exam  There were no vitals filed for this visit. Gen:   Awake, no distress   Resp:  Normal effort  MSK:   Moves extremities without difficulty  Other:    Medical Decision Making  Given the patient's initial medical screening exam, the following diagnostic evaluation has been ordered. The patient will be placed in the appropriate treatment space, once one is available, to complete the evaluation and treatment. I have discussed the plan of care with the patient and I have advised the patient that an ED physician or mid-level practitioner will reevaluate their condition after the test results have been received, as the results may give them additional insight into the type of treatment they may need.    Diagnostics: Labs, EKG, CXR, head CT, neck CT.  Treatments: none immediately   Teodoro Spray, [place] 03/22/21 1427    Vanessa [place], MD 03/22/21 [phone]

## 2021-03-22 NOTE — H&P (Signed)
History and Physical   Elijah Michalik Sr. JTT:[id number] DOB: 22-May-1934 DOA: 03/22/2021  PCP: Olin Hauser, DO  Outpatient Specialists: Dr. Quentin Ore, cardiology Patient coming from: Home via EMS  I have personally briefly reviewed patient's old medical records in [place].  Chief Concern: Syncope  HPI: Elijah Thomason Sr. is a 86 y.o. male with medical history significant for myasthenia gravis, dementia, hyperlipidemia, hypertension, GERD, OSA, glaucoma, insomnia, who presents to the emergency department for chief concerns of syncopal event.  At bedside, he was able to tell me his name. He knows he is in the hospital but does not know why. He was not able to tell me his age or current year.  He was laying in the hospital bed and does not appear to be in acute distress.  He denies being in any pain.  He does not remember passing out.  Per spouse, patient was weak last evening so spouse helped him get to bed. This morning she was in the living room when she heard him call out to her. She went to the bedroom and saw him sitting on the floor with his legs underneath him and she helped him get his legs out from underneath him.   When they were at home, patient stated to wife, that he thinks he passed out. Per spouse, he was incoherent with EMS.   Social history: He states he lives at home with his wife. He denies tobacco use. He states he only drinks on Saturdays, it is 6 beers. He denies recreational dru guse. He is retired and formerly worked for [employer] and it is classified.   Vaccination history: unknown  ROS: Unable to complete due to dementia   ED Course: Discussed with emergency medicine provider, patient requiring hospitalization for chief concerns of syncopal event.  Vitals in the emergency department was remarkable for T-max of 102.1, current temperature of 99.6, respiration rate of 20, heart rate of 91, blood pressure 119/60, SPO2 of 100% on room air.  Labs in the  emergency department was remarkable for serum sodium 133, potassium 4.2, chloride 100, bicarb 25, BUN of 22, serum creatinine of 1.54, GFR is 44, nonfasting blood glucose 114, WBC 10.9, hemoglobin 15.2, platelets 180.  Patient tested positive for COVID-19.  UA was positive for large leukocytes.  Assessment/Plan  Principal Problem:   Syncope Active Problems:   Dizziness and giddiness   Essential hypertension   HLD (hyperlipidemia)   GERD (gastroesophageal reflux disease)   OSA (obstructive sleep apnea)   # Syncope-etiology work-up in progress differentials include COVID-19 infection versus UTI versus NSTEMI # Met sepsis criteria with elevated respiration rate, T-max of 102.1, leukocytosis, source of urine versus COVID - Telemetry medical floor, observation - Continue remdesivir per EDP - Ceftriaxone 2 g IV ordered  # Hypertension-resumed lisinopril 20 mg daily  # NSTEMI - We will follow second troponin - Heparin GTT per pharmacy ordered  # Dementia/memory disturbances-donepezil 10 mg nightly; memantine 10 mg p.o. twice daily  # Insomnia-zolpidem 5 mg p.o. nightly as needed for sleep  Chart reviewed.   DVT prophylaxis: Heparin GTT Code Status: Full code Diet: Heart healthy Family Communication: Discussed with spouse at bedside, time was offered for spouse to ask questions Disposition Plan: Pending clinical course Consults called: None at this time Admission status: Observation, telemetry medical floor  Past Medical History:  Diagnosis Date   Dyslipidemia    GERD (gastroesophageal reflux disease)    Glaucoma    Hyperlipidemia  Hypertension    Insomnia    Myasthenia gravis ([place]) 09/08/2012   Obesity    Ocular myasthenia gravis ([place])    Prostate cancer [facility])    Past Surgical History:  Procedure Laterality Date   CATARACT EXTRACTION Bilateral    CHOLECYSTECTOMY     TRANSURETHRAL RESECTION OF PROSTATE     Social History:  reports that he quit smoking about 50  years ago. His smoking use included cigarettes. He has quit using smokeless tobacco. He reports current alcohol use. He reports that he does not use drugs.  Allergies  Allergen Reactions   Pravastatin Other (See Comments)   Simvastatin Other (See Comments)   Family History  Problem Relation Age of Onset   Heart attack Father    Family history: Family history reviewed and pertinent for history of heart attack in father.  Prior to Admission medications   Medication Sig Start Date End Date Taking? Authorizing Provider  acetaminophen (TYLENOL) 325 MG tablet Take 1 tablet (325 mg total) by mouth every 6 (six) hours as needed for mild pain (or Fever >/= 101). 01/10/17  Yes Gouru, Illene Silver, MD  apixaban (ELIQUIS) 5 MG TABS tablet Take 1 tablet (5 mg total) by mouth 2 (two) times daily. 02/24/17  Yes Karamalegos, Devonne Doughty, DO  atorvastatin (LIPITOR) 10 MG tablet Take 10 mg by mouth daily.   Yes [provider]  donepezil (ARICEPT) 10 MG tablet TAKE ONE TABLET BY MOUTH AT BEDTIME TO SLOW MEMORY LOSS (NOTE CHANGE IN DIRECTIONS AND TABLET STRENGTH) THIS IS NOT A DOSE CHANGE 12/27/20  Yes [provider]  feeding supplement, ENSURE ENLIVE, (ENSURE ENLIVE) LIQD Take 237 mLs by mouth 2 (two) times daily between meals. 01/10/17  Yes Gouru, Aruna, MD  latanoprost (XALATAN) 0.005 % ophthalmic solution Place 1 drop into both eyes at bedtime.   Yes [provider]  lisinopril (PRINIVIL,ZESTRIL) 40 MG tablet Take 0.5 tablets (20 mg total) by mouth daily. 01/10/17  Yes Gouru, Aruna, MD  memantine (NAMENDA) 10 MG tablet TAKE TWO TABLETS BY MOUTH AT BEDTIME TO SLOW MEMORY LOSS 12/27/20  Yes [provider]  methocarbamol (ROBAXIN) 500 MG tablet Take 500 mg by mouth in the morning, at noon, and at bedtime. 04/16/20  Yes [provider]  omeprazole (PRILOSEC) 40 MG capsule Take 1 capsule (40 mg total) by mouth 2 (two) times daily before a meal. 07/24/19  Yes Karamalegos,  Devonne Doughty, DO  predniSONE (DELTASONE) 1 MG tablet Take 2 mg by mouth daily with breakfast.   Yes [provider]  predniSONE (DELTASONE) 5 MG tablet Take 5 mg by mouth daily. 05/15/14  Yes [provider]  pyridostigmine (MESTINON) 60 MG tablet TAKE ONE TABLET BY MOUTH TWICE A DAY - TAKE AT 1PM AND 5PM 12/27/20  Yes [provider]  senna-docusate (SENOKOT-S) 8.6-50 MG tablet Take 1 tablet by mouth at bedtime as needed for mild constipation. 01/10/17  Yes Gouru, Illene Silver, MD  Vitamins/Minerals TABS Take 1 tablet by mouth daily.   Yes [provider]  gabapentin (NEURONTIN) 100 MG capsule Start 1 capsule daily, increase by 1 cap every 2-3 days as tolerated up to 3 times a day, or may take 3 at once in evening. Patient not taking: Reported on 03/22/2021 12/13/19   Olin Hauser, DO  zolpidem (AMBIEN) 5 MG tablet Take 5 mg by mouth at bedtime as needed for sleep.    [provider]   Physical Exam: Vitals:   03/22/21 2230 03/22/21  2245 03/22/21 2300 03/22/21 2315  BP: 113/65 104/61 (!) 104/59 135/68  Pulse: (!) 102 94 98 98  Resp: (!) 23 (!) 21 (!) 26 (!) 24  Temp:      TempSrc:      SpO2: 96% 95% 92% 94%  Weight:      Height:       Constitutional: appears age-appropriate, NAD, calm, comfortable Eyes: PERRL, lids and conjunctivae normal ENMT: Mucous membranes are moist. Posterior pharynx clear of any exudate or lesions. Age-appropriate dentition.  Mild hearing loss Neck: normal, supple, no masses, no thyromegaly Respiratory: clear to auscultation bilaterally, no wheezing, no crackles. Normal respiratory effort. No accessory muscle use.  Cardiovascular: Regular rate and rhythm, no murmurs / rubs / gallops. No extremity edema. 2+ pedal pulses. No carotid bruits.  Abdomen: Obese abdomen, no tenderness, no masses palpated, no hepatosplenomegaly. Bowel sounds positive.  Musculoskeletal: no clubbing / cyanosis. No joint deformity upper and lower  extremities. Good ROM, no contractures, no atrophy. Normal muscle tone.  Skin: no rashes, lesions, ulcers. No induration Neurologic: Sensation intact. Strength 5/5 in all 4.  Psychiatric: Normal judgment and insight. Alert and oriented x self and current location. Normal mood.   EKG: independently reviewed, showing atrial fibrillation with rate of 96, QTc 421  Chest x-ray on Admission: I personally reviewed and I agree with radiologist reading as below.  DG Chest 1 View  Result Date: 03/22/2021 CLINICAL DATA:  Weakness. EXAM: CHEST  1 VIEW COMPARISON:  March 31, 2018 FINDINGS: The heart size and mediastinal contours are within normal limits. Both lungs are clear. The visualized skeletal structures are unremarkable. IMPRESSION: No active disease. Electronically Signed   By: Dorise Bullion III M.D.   On: 03/22/2021 16:46   CT Head Wo Contrast  Result Date: 03/22/2021 CLINICAL DATA:  Head trauma EXAM: CT HEAD WITHOUT CONTRAST TECHNIQUE: Contiguous axial images were obtained from the base of the skull through the vertex without intravenous contrast. COMPARISON:  CT head 06/06/2007 FINDINGS: Brain: No acute intracranial hemorrhage, mass effect, or herniation. No extra-axial fluid collections. No evidence of acute territorial infarct. No hydrocephalus. Mild cortical volume loss. Extensive hypodensities throughout the periventricular and subcortical white matter, likely secondary to chronic microvascular ischemic changes. Vascular: Calcified plaques in the carotid siphons. Skull: Normal. Negative for fracture or focal lesion. Sinuses/Orbits: Small air-fluid levels in the bilateral maxillary sinuses and mild mucosal thickening throughout the paranasal sinuses. Other: None. IMPRESSION: 1. No acute intracranial process identified. 2. Advanced chronic microvascular ischemic changes. 3. Small air-fluid levels in the maxillary sinuses, correlate for evidence of acute sinusitis. Electronically Signed   By:  Ofilia Neas M.D.   On: 03/22/2021 15:25   CT Cervical Spine Wo Contrast  Result Date: 03/22/2021 CLINICAL DATA:  Neck trauma EXAM: CT CERVICAL SPINE WITHOUT CONTRAST TECHNIQUE: Multidetector CT imaging of the cervical spine was performed without intravenous contrast. Multiplanar CT image reconstructions were also generated. COMPARISON:  None. FINDINGS: Alignment: Grade 1 anterolisthesis of C4 on C5. Skull base and vertebrae: No acute fracture. No primary bone lesion or focal pathologic process. Soft tissues and spinal canal: No prevertebral fluid or swelling. No visible canal hematoma. Disc levels: Severe intervertebral disc space narrowing at C5-C6 with endplate sclerosis, uncovertebral spurring and prominent dorsal endplate osteophytes. Moderate to severe disc space narrowing at C6-C7 and C7-T1 with small dorsal endplate osteophytes. Bilateral facet arthropathy. Multilevel neural foraminal narrowing most significant at C3-C4 and C5-C6. Multilevel spinal canal stenoses most significant at C5-C6. Upper chest: No  acute process visualized. Other: Calcified plaques in the carotid arteries. IMPRESSION: Multilevel degenerative changes of the cervical spine with no acute fracture or subluxation identified. Electronically Signed   By: Ofilia Neas M.D.   On: 03/22/2021 15:29    Labs on Admission: I have personally reviewed following labs  CBC: Recent Labs  Lab 03/22/21 1432  WBC 10.9*  NEUTROABS 9.0*  HGB 15.2  HCT 44.9  MCV 95.3  PLT 623   Basic Metabolic Panel: Recent Labs  Lab 03/22/21 1432  NA 133*  K 4.2  CL 100  CO2 25  GLUCOSE 114*  BUN 22  CREATININE 1.54*  CALCIUM 9.0   GFR: Estimated Creatinine Clearance: 39.4 mL/min (A) (by C-G formula based on SCr of 1.54 mg/dL (H)).  Liver Function Tests: Recent Labs  Lab 03/22/21 1432  AST 23  ALT 19  ALKPHOS 64  BILITOT 1.6*  PROT 7.0  ALBUMIN 3.7   Coagulation Profile: Recent Labs  Lab 03/22/21 1432  INR 1.3*    Urine analysis:    Component Value Date/Time   COLORURINE AMBER (A) 03/22/2021 1954   APPEARANCEUR HAZY (A) 03/22/2021 1954   APPEARANCEUR Clear 01/07/2014 1435   LABSPEC 1.024 03/22/2021 1954   LABSPEC 1.020 01/07/2014 1435   PHURINE 5.0 03/22/2021 1954   GLUCOSEU NEGATIVE 03/22/2021 1954   GLUCOSEU Negative 01/07/2014 1435   HGBUR LARGE (A) 03/22/2021 1954   BILIRUBINUR NEGATIVE 03/22/2021 1954   BILIRUBINUR Negative 01/07/2014 1435   KETONESUR 20 (A) 03/22/2021 1954   PROTEINUR 100 (A) 03/22/2021 1954   NITRITE NEGATIVE 03/22/2021 1954   LEUKOCYTESUR NEGATIVE 03/22/2021 1954   LEUKOCYTESUR Negative 01/07/2014 1435   Dr. Tobie Poet [facility]  If 7PM-7AM, please contact overnight-coverage provider If 7AM-7PM, please contact day coverage provider [email or web address]  03/22/2021, 11:33 PM

## 2021-03-23 DIAGNOSIS — Z8546 Personal history of malignant neoplasm of prostate: Secondary | ICD-10-CM | POA: Diagnosis not present

## 2021-03-23 DIAGNOSIS — R55 Syncope and collapse: Secondary | ICD-10-CM | POA: Diagnosis present

## 2021-03-23 DIAGNOSIS — I472 Ventricular tachycardia, unspecified: Secondary | ICD-10-CM | POA: Diagnosis present

## 2021-03-23 DIAGNOSIS — R42 Dizziness and giddiness: Secondary | ICD-10-CM | POA: Diagnosis not present

## 2021-03-23 DIAGNOSIS — U071 COVID-19: Secondary | ICD-10-CM

## 2021-03-23 DIAGNOSIS — Z86711 Personal history of pulmonary embolism: Secondary | ICD-10-CM | POA: Diagnosis not present

## 2021-03-23 DIAGNOSIS — R531 Weakness: Secondary | ICD-10-CM | POA: Diagnosis not present

## 2021-03-23 DIAGNOSIS — H409 Unspecified glaucoma: Secondary | ICD-10-CM | POA: Diagnosis present

## 2021-03-23 DIAGNOSIS — I129 Hypertensive chronic kidney disease with stage 1 through stage 4 chronic kidney disease, or unspecified chronic kidney disease: Secondary | ICD-10-CM | POA: Diagnosis present

## 2021-03-23 DIAGNOSIS — R778 Other specified abnormalities of plasma proteins: Secondary | ICD-10-CM | POA: Diagnosis present

## 2021-03-23 DIAGNOSIS — F03918 Unspecified dementia, unspecified severity, with other behavioral disturbance: Secondary | ICD-10-CM | POA: Diagnosis present

## 2021-03-23 DIAGNOSIS — I480 Paroxysmal atrial fibrillation: Secondary | ICD-10-CM | POA: Diagnosis present

## 2021-03-23 DIAGNOSIS — I10 Essential (primary) hypertension: Secondary | ICD-10-CM | POA: Diagnosis not present

## 2021-03-23 DIAGNOSIS — I495 Sick sinus syndrome: Secondary | ICD-10-CM | POA: Diagnosis present

## 2021-03-23 DIAGNOSIS — N1831 Chronic kidney disease, stage 3a: Secondary | ICD-10-CM | POA: Diagnosis present

## 2021-03-23 DIAGNOSIS — G4700 Insomnia, unspecified: Secondary | ICD-10-CM | POA: Diagnosis present

## 2021-03-23 DIAGNOSIS — G7001 Myasthenia gravis with (acute) exacerbation: Secondary | ICD-10-CM | POA: Diagnosis present

## 2021-03-23 DIAGNOSIS — I951 Orthostatic hypotension: Secondary | ICD-10-CM | POA: Diagnosis not present

## 2021-03-23 DIAGNOSIS — K219 Gastro-esophageal reflux disease without esophagitis: Secondary | ICD-10-CM | POA: Diagnosis present

## 2021-03-23 DIAGNOSIS — Z87891 Personal history of nicotine dependence: Secondary | ICD-10-CM | POA: Diagnosis not present

## 2021-03-23 DIAGNOSIS — Z8249 Family history of ischemic heart disease and other diseases of the circulatory system: Secondary | ICD-10-CM | POA: Diagnosis not present

## 2021-03-23 DIAGNOSIS — I441 Atrioventricular block, second degree: Secondary | ICD-10-CM | POA: Diagnosis present

## 2021-03-23 DIAGNOSIS — E669 Obesity, unspecified: Secondary | ICD-10-CM | POA: Diagnosis present

## 2021-03-23 DIAGNOSIS — J1282 Pneumonia due to coronavirus disease 2019: Secondary | ICD-10-CM | POA: Diagnosis not present

## 2021-03-23 DIAGNOSIS — Z7901 Long term (current) use of anticoagulants: Secondary | ICD-10-CM | POA: Diagnosis not present

## 2021-03-23 DIAGNOSIS — E785 Hyperlipidemia, unspecified: Secondary | ICD-10-CM | POA: Diagnosis present

## 2021-03-23 DIAGNOSIS — Z79899 Other long term (current) drug therapy: Secondary | ICD-10-CM | POA: Diagnosis not present

## 2021-03-23 DIAGNOSIS — G4733 Obstructive sleep apnea (adult) (pediatric): Secondary | ICD-10-CM | POA: Diagnosis present

## 2021-03-23 LAB — BASIC METABOLIC PANEL
Anion gap: 7 (ref 5–15)
BUN: 25 mg/dL — ABNORMAL HIGH (ref 8–23)
CO2: 26 mmol/L (ref 22–32)
Calcium: 8.3 mg/dL — ABNORMAL LOW (ref 8.9–10.3)
Chloride: 103 mmol/L (ref 98–111)
Creatinine, Ser: 1.57 mg/dL — ABNORMAL HIGH (ref 0.61–1.24)
GFR, Estimated: 43 mL/min — ABNORMAL LOW (ref >60)
Glucose, Bld: 88 mg/dL (ref 70–99)
Potassium: 4.1 mmol/L (ref 3.5–5.1)
Sodium: 136 mmol/L (ref 135–145)

## 2021-03-23 LAB — BLOOD CULTURE ID PANEL (REFLEXED) - BCID2

## 2021-03-23 LAB — CBC
HCT: 39.8 % (ref 39.0–52.0)
Hemoglobin: 13.6 g/dL (ref 13.0–17.0)
MCH: 32.8 pg (ref 26.0–34.0)
MCHC: 34.2 g/dL (ref 30.0–36.0)
MCV: 95.9 fL (ref 80.0–100.0)
Platelets: 161 10*3/uL (ref 150–400)
RBC: 4.15 MIL/uL — ABNORMAL LOW (ref 4.22–5.81)
RDW: 13.3 % (ref 11.5–15.5)
WBC: 7.8 10*3/uL (ref 4.0–10.5)
nRBC: 0 % (ref 0.0–0.2)

## 2021-03-23 LAB — TROPONIN I (HIGH SENSITIVITY): Troponin I (High Sensitivity): 422 ng/L (ref <18)

## 2021-03-23 LAB — C-REACTIVE PROTEIN: CRP: 17.9 mg/dL — ABNORMAL HIGH (ref <1.0)

## 2021-03-23 LAB — D-DIMER, QUANTITATIVE: D-Dimer, Quant: 1.42 ug/mL-FEU — ABNORMAL HIGH (ref 0.00–0.50)

## 2021-03-23 LAB — APTT: aPTT: 103 seconds — ABNORMAL HIGH (ref 24–36)

## 2021-03-23 LAB — CK: Total CK: 605 U/L — ABNORMAL HIGH (ref 49–397)

## 2021-03-23 LAB — FERRITIN: Ferritin: 260 ng/mL (ref 24–336)

## 2021-03-23 LAB — HEPARIN LEVEL (UNFRACTIONATED): Heparin Unfractionated: >1.1 IU/mL — ABNORMAL HIGH (ref 0.30–0.70)

## 2021-03-23 MED ORDER — PREDNISONE 20 MG PO TABS
50.0000 mg | ORAL_TABLET | Freq: Every day | ORAL | Status: DC
  Administered 2021-03-23 – 2021-03-26 (×4): 50 mg via ORAL
  Filled 2021-03-23: qty 1
  Filled 2021-03-23 (×2): qty 3
  Filled 2021-03-23: qty 1

## 2021-03-23 MED ORDER — NIRMATRELVIR/RITONAVIR (PAXLOVID) TABLET (RENAL DOSING)
2.0000 | ORAL_TABLET | Freq: Two times a day (BID) | ORAL | Status: AC
  Administered 2021-03-23 – 2021-03-27 (×10): 2 via ORAL
  Filled 2021-03-23: qty 20
  Filled 2021-03-23: qty 2

## 2021-03-23 MED ORDER — ATORVASTATIN CALCIUM 20 MG PO TABS: 10.0000 mg | ORAL_TABLET | Freq: Every day | ORAL | Status: DC

## 2021-03-23 MED ORDER — APIXABAN 5 MG PO TABS
5.0000 mg | ORAL_TABLET | Freq: Two times a day (BID) | ORAL | Status: DC
  Administered 2021-03-23 – 2021-03-25 (×5): 5 mg via ORAL
  Filled 2021-03-23 (×5): qty 1

## 2021-03-23 NOTE — ED Notes (Signed)
Pt resting with eyes closed, respirations even and unlabored at this time.

## 2021-03-23 NOTE — ED Notes (Signed)
PT at bedside.

## 2021-03-23 NOTE — Progress Notes (Addendum)
PHARMACY - PHYSICIAN COMMUNICATION CRITICAL VALUE ALERT - BLOOD CULTURE IDENTIFICATION (BCID)  Elijah Feigel Sr. is an 86 y.o. male who presented to [facility] on 03/22/2021 with a chief complaint of syncope.  Assessment:  1/4 bottles (aerobic) GPC - Staph species, possible contaminant. Consider vancomycin.   Name of physician (or Provider) Contacted: Dr. Reesa Chew  Current antibiotics: Ceftriaxone  Changes to prescribed antibiotics recommended:  No changes to current antibiotics. MD to order repeat blood cultures.  Results for orders placed or performed during the hospital encounter of 03/22/21  Blood Culture ID Panel (Reflexed) (Collected: 03/22/2021  9:00 PM)  Result Value Ref Range   Enterococcus faecalis NOT DETECTED NOT DETECTED   Enterococcus Faecium NOT DETECTED NOT DETECTED   Listeria monocytogenes NOT DETECTED NOT DETECTED   Staphylococcus species DETECTED (A) NOT DETECTED   Staphylococcus aureus (BCID) NOT DETECTED NOT DETECTED   Staphylococcus epidermidis NOT DETECTED NOT DETECTED   Staphylococcus lugdunensis NOT DETECTED NOT DETECTED   Streptococcus species NOT DETECTED NOT DETECTED   Streptococcus agalactiae NOT DETECTED NOT DETECTED   Streptococcus pneumoniae NOT DETECTED NOT DETECTED   Streptococcus pyogenes NOT DETECTED NOT DETECTED   A.calcoaceticus-baumannii NOT DETECTED NOT DETECTED   Bacteroides fragilis NOT DETECTED NOT DETECTED   Enterobacterales NOT DETECTED NOT DETECTED   Enterobacter cloacae complex NOT DETECTED NOT DETECTED   Escherichia coli NOT DETECTED NOT DETECTED   Klebsiella aerogenes NOT DETECTED NOT DETECTED   Klebsiella oxytoca NOT DETECTED NOT DETECTED   Klebsiella pneumoniae NOT DETECTED NOT DETECTED   Proteus species NOT DETECTED NOT DETECTED   Salmonella species NOT DETECTED NOT DETECTED   Serratia marcescens NOT DETECTED NOT DETECTED   Haemophilus influenzae NOT DETECTED NOT DETECTED   Neisseria meningitidis NOT DETECTED NOT DETECTED    Pseudomonas aeruginosa NOT DETECTED NOT DETECTED   Stenotrophomonas maltophilia NOT DETECTED NOT DETECTED   Candida albicans NOT DETECTED NOT DETECTED   Candida auris NOT DETECTED NOT DETECTED   Candida glabrata NOT DETECTED NOT DETECTED   Candida krusei NOT DETECTED NOT DETECTED   Candida parapsilosis NOT DETECTED NOT DETECTED   Candida tropicalis NOT DETECTED NOT DETECTED   Cryptococcus neoformans/gattii NOT DETECTED NOT DETECTED    Katasha Riga O Lanah Steines 03/23/2021  4:05 PM

## 2021-03-23 NOTE — ED Notes (Signed)
Lab notified of new blood culture order.  Contacted d/t previous set possibly being contaminated and Pt being a hard stick.

## 2021-03-23 NOTE — ED Notes (Signed)
Per previous RN, foley was placed yesterday d/t urinary retention.

## 2021-03-23 NOTE — ED Notes (Signed)
Lab reports all add-ons can be ran on blood already in the lab.

## 2021-03-23 NOTE — ED Notes (Signed)
Pt noted to be increasingly agitated.  Pt redirected to not pull on foley and cardiac leads several times.  Additionally, Pt keeps removing covers and taking off gown.  Pt will not allow the writer to retake his temperature.    Will continue to monitor.

## 2021-03-23 NOTE — ED Notes (Signed)
Pt noted to be sleeping soundly.  

## 2021-03-23 NOTE — Evaluation (Signed)
Physical Therapy Evaluation Patient Details Name: Elijah Asfaw Sr. MRN: [id number] DOB: [birth year] Today's Date: 03/23/2021  History of Present Illness  Patient is a 86 year old male with medical history significant for myasthenia gravis, dementia, hyperlipidemia, hypertension, GERD, OSA, glaucoma, insomnia, who presents to the emergency department for chief concerns of syncopal event where patient was found on the ground at home.  Clinical Impression  Patient agreeable to PT evaluation. He seems to have mild confusion about recent events but is able to follow commands with extra time. He reports he is independent at baseline at home and walks without assistive device. He also reports he has fallen more than once at home as well. No family in the room to confirm (called spouse with no answer).  Patient currently needs assistance with functional mobility. Moderate assistance required for bed mobility. Increased time and effort required. Fair sitting balance with occasional posterior lean. Patient initially agreeable to stand, then he declined due to reported fatigue. Vitals monitored throughout session without significant change noted during mobility efforts. Patient is likely not at his baseline level of functional mobility. Consider SNF placement unless patient has adequate assistance at home. I called to discuss discharge planning with the spouse with no answer. Also discussed with TOC and MD. PT will continue to follow to maximize independence and facilitate return to prior level of function.      Recommendations for follow up therapy are one component of a multi-disciplinary discharge planning process, led by the attending physician.  Recommendations may be updated based on patient status, additional functional criteria and insurance authorization.  Follow Up Recommendations Skilled nursing-short term rehab (<3 hours/day)    Assistance Recommended at Discharge Frequent or constant  Supervision/Assistance  Functional Status Assessment Patient has had a recent decline in their functional status and demonstrates the ability to make significant improvements in function in a reasonable and predictable amount of time.  Equipment Recommendations   (ongoing assessment)    Recommendations for Other Services       Precautions / Restrictions Precautions Precautions: Fall Restrictions Weight Bearing Restrictions: No      Mobility  Bed Mobility Overal bed mobility: Needs Assistance Bed Mobility: Supine to Sit     Supine to sit: Mod assist     General bed mobility comments: trunk assistance required to sit upright. cues for sequencing provided    Transfers                   General transfer comment: patient initially agreeable to stand. after sitting for ~ 2 minutes, he reports he needs to return to bed. he denied dizziness with upright activity and reports just feeling fatigued and needing to rest. vitals monitored throughout without significant changes noted    Ambulation/Gait                  Stairs            Wheelchair Mobility    Modified Rankin (Stroke Patients Only)       Balance Overall balance assessment: Needs assistance;History of Falls Sitting-balance support: Single extremity supported;Feet unsupported Sitting balance-Leahy Scale: Fair Sitting balance - Comments: occasional loss of balance posteriorly that required cues to maintain midline sitting balance                                     Pertinent Vitals/Pain Pain Assessment: No/denies pain Breathing: normal Negative Vocalization:  occasional moan/groan, low speech, negative/disapproving quality Facial Expression: smiling or inexpressive Body Language: relaxed Consolability: no need to console PAINAD Score: 1    Home Living Family/patient expects to be discharged to:: Private residence Living Arrangements: Spouse/significant other Available  Help at Discharge: Family               Additional Comments: patient is a poor historian. called the spouse x 2 with no answer    Prior Function Prior Level of Function : Independent/Modified Independent             Mobility Comments: per patient report he is independent. reports he has fallen in the past ADLs Comments: patient reports he is independent     Hand Dominance        Extremity/Trunk Assessment   Upper Extremity Assessment Upper Extremity Assessment: Generalized weakness    Lower Extremity Assessment Lower Extremity Assessment: Generalized weakness       Communication      Cognition Arousal/Alertness: Awake/alert Behavior During Therapy: WFL for tasks assessed/performed Overall Cognitive Status: History of cognitive impairments - at baseline                                 General Comments: patient able to follow commands with increased time        General Comments      Exercises     Assessment/Plan    PT Assessment Patient needs continued PT services  PT Problem List Decreased strength;Decreased activity tolerance;Decreased balance;Decreased mobility;Decreased safety awareness;Decreased cognition       PT Treatment Interventions DME instruction;Gait training;Functional mobility training;Stair training;Therapeutic activities;Therapeutic exercise;Balance training;Neuromuscular re-education;Cognitive remediation;Patient/family education    PT Goals (Current goals can be found in the Care Plan section)  Acute Rehab PT Goals Patient Stated Goal: none stated PT Goal Formulation: With patient Time For Goal Achievement: 04/06/21 Potential to Achieve Goals: Fair    Frequency Min 2X/week   Barriers to discharge        Co-evaluation               AM-PAC PT "6 Clicks" Mobility  Outcome Measure Help needed turning from your back to your side while in a flat bed without using bedrails?: A Little Help needed moving from  lying on your back to sitting on the side of a flat bed without using bedrails?: A Lot Help needed moving to and from a bed to a chair (including a wheelchair)?: A Lot Help needed standing up from a chair using your arms (e.g., wheelchair or bedside chair)?: A Lot Help needed to walk in hospital room?: A Lot Help needed climbing 3-5 steps with a railing? : A Lot 6 Click Score: 13    End of Session   Activity Tolerance: Patient limited by fatigue Patient left: in bed;with call bell/phone within reach Nurse Communication: Mobility status PT Visit Diagnosis: Unsteadiness on feet (R26.81);Muscle weakness (generalized) (M62.81)    Time: 6803-2122 PT Time Calculation (min) (ACUTE ONLY): 24 min   Charges:   PT Evaluation $PT Eval Moderate Complexity: 1 Mod PT Treatments $Therapeutic Activity: 8-22 mins        Minna Merritts, PT, MPT  Percell Locus 03/23/2021, 3:24 PM

## 2021-03-23 NOTE — Progress Notes (Signed)
ANTICOAGULATION CONSULT NOTE  Pharmacy Consult for heparin infusion Indication: NSTEMI  Allergies  Allergen Reactions   Pravastatin Other (See Comments)   Simvastatin Other (See Comments)    Patient Measurements: Height: 5\' 9"  (175.3 cm) Weight: 89 kg (196 lb 3.4 oz) IBW/kg (Calculated) : 70.7 Heparin Dosing Weight: 88.6 kg  Vital Signs: Temp: 99.6 F (37.6 C) (12/17 2209) Temp Source: Oral (12/17 2209) BP: 160/77 (12/18 0900) Pulse Rate: 112 (12/18 0900)  Labs: Recent Labs    03/22/21 1432 03/22/21 2052 03/23/21 0042 03/23/21 0437 03/23/21 0838  HGB 15.2  --   --  13.6  --   HCT 44.9  --   --  39.8  --   PLT 180  --   --  161  --   APTT  --   --   --   --  103*  LABPROT 15.7*  --   --   --   --   INR 1.3*  --   --   --   --   HEPARINUNFRC  --   --   --   --  >1.10*  CREATININE 1.54*  --   --  1.57*  --   CKTOTAL  --  537*  --   --   --   TROPONINIHS  --  497* 422*  --   --      Estimated Creatinine Clearance: 38.6 mL/min (A) (by C-G formula based on SCr of 1.57 mg/dL (H)).   Medical History: Past Medical History:  Diagnosis Date   Dyslipidemia    GERD (gastroesophageal reflux disease)    Glaucoma    Hyperlipidemia    Hypertension    Insomnia    Myasthenia gravis ([place]) 09/08/2012   Obesity    Ocular myasthenia gravis ([place])    Prostate cancer ([place])     Medications:  PTA Med:  Apixaban 5 mg BID, dose given 12/17 @ 2312  Assessment: Pt is 86 yo male presenting to ED due to syncope event found with elevated Troponin I.  12/18 0838 aPTT 103 HL > 1.1    Goal of Therapy:  Heparin level 0.3-0.7 units/ml once aPTT and heparin level correlate.  aPTT 66-102 seconds Monitor platelets by anticoagulation protocol: Yes   Plan:  aPTT is slightly supratherapeutic. Will decrease the heparin infusion to 1050 units/hr. Recheck aPTT in 8 hours. Heparin level and CBC with AM labs while on heparin level. Switch to heparin level monitoring once aPTT and heparin  level correlate.    Eleonore Chiquito, PharmD 03/23/2021 9:21 AM

## 2021-03-23 NOTE — Consult Note (Signed)
Cardiology Consultation:   Patient ID: Elijah Ponder Sr. MRN: [id number]; DOB: 05-10-1934  Admit date: 03/22/2021 Date of Consult: 03/23/2021  PCP:  Olin Hauser, DO   [place] Providers Cardiologist:  None  Electrophysiologist:  Vickie Epley, MD       Patient Profile:   Elijah Rivero Sr. is a 86 y.o. male with a hx of recurrent syncope who is being seen 03/23/2021 for the evaluation of syncope at the request of Dr. Reesa Chew.  History of Present Illness:   Elijah Jackson is known to Dr. Quentin Ore. He underwent ILR insertion for syncope 2 months ago. He had recurrent syncope prompting the current admit. The patient cannot give me much info about what happened and his wife is not present on my visit. He denies sob or chest pain. He appears to have some dementia. He did not injure himself. He cannot give me any details about the event stating "I don't remember."   Past Medical History:  Diagnosis Date   Dyslipidemia    GERD (gastroesophageal reflux disease)    Glaucoma    Hyperlipidemia    Hypertension    Insomnia    Myasthenia gravis ([place]) 09/08/2012   Obesity    Ocular myasthenia gravis ([place])    Prostate cancer ([place])     Past Surgical History:  Procedure Laterality Date   CATARACT EXTRACTION Bilateral    CHOLECYSTECTOMY     TRANSURETHRAL RESECTION OF PROSTATE       Home Medications:  Prior to Admission medications   Medication Sig Start Date End Date Taking? Authorizing Provider  acetaminophen (TYLENOL) 325 MG tablet Take 1 tablet (325 mg total) by mouth every 6 (six) hours as needed for mild pain (or Fever >/= 101). 01/10/17  Yes Gouru, Illene Silver, MD  apixaban (ELIQUIS) 5 MG TABS tablet Take 1 tablet (5 mg total) by mouth 2 (two) times daily. 02/24/17  Yes Karamalegos, Devonne Doughty, DO  atorvastatin (LIPITOR) 10 MG tablet Take 10 mg by mouth daily.   Yes [provider]  donepezil (ARICEPT) 10 MG tablet TAKE ONE TABLET BY MOUTH AT BEDTIME TO SLOW  MEMORY LOSS (NOTE CHANGE IN DIRECTIONS AND TABLET STRENGTH) THIS IS NOT A DOSE CHANGE 12/27/20  Yes [provider]  feeding supplement, ENSURE ENLIVE, (ENSURE ENLIVE) LIQD Take 237 mLs by mouth 2 (two) times daily between meals. 01/10/17  Yes Gouru, Aruna, MD  latanoprost (XALATAN) 0.005 % ophthalmic solution Place 1 drop into both eyes at bedtime.   Yes [provider]  lisinopril (PRINIVIL,ZESTRIL) 40 MG tablet Take 0.5 tablets (20 mg total) by mouth daily. 01/10/17  Yes Gouru, Aruna, MD  memantine (NAMENDA) 10 MG tablet TAKE TWO TABLETS BY MOUTH AT BEDTIME TO SLOW MEMORY LOSS 12/27/20  Yes [provider]  methocarbamol (ROBAXIN) 500 MG tablet Take 500 mg by mouth in the morning, at noon, and at bedtime. 04/16/20  Yes [provider]  omeprazole (PRILOSEC) 40 MG capsule Take 1 capsule (40 mg total) by mouth 2 (two) times daily before a meal. 07/24/19  Yes Karamalegos, Devonne Doughty, DO  predniSONE (DELTASONE) 1 MG tablet Take 2 mg by mouth daily with breakfast.   Yes [provider]  predniSONE (DELTASONE) 5 MG tablet Take 5 mg by mouth daily. 05/15/14  Yes [provider]  pyridostigmine (MESTINON) 60 MG tablet TAKE ONE TABLET BY MOUTH TWICE A DAY - TAKE AT 1PM AND 5PM 12/27/20  Yes [provider]  senna-docusate (SENOKOT-S) 8.6-50 MG tablet Take  1 tablet by mouth at bedtime as needed for mild constipation. 01/10/17  Yes Gouru, Illene Silver, MD  Vitamins/Minerals TABS Take 1 tablet by mouth daily.   Yes [provider]  gabapentin (NEURONTIN) 100 MG capsule Start 1 capsule daily, increase by 1 cap every 2-3 days as tolerated up to 3 times a day, or may take 3 at once in evening. Patient not taking: Reported on 03/22/2021 12/13/19   Olin Hauser, DO  zolpidem (AMBIEN) 5 MG tablet Take 5 mg by mouth at bedtime as needed for sleep.    [provider]    Inpatient Medications: Scheduled Meds:  [START ON 03/28/2021]  atorvastatin  10 mg Oral Daily   donepezil  10 mg Oral QHS   feeding supplement  237 mL Oral BID BM   latanoprost  1 drop Both Eyes QHS   lisinopril  20 mg Oral Daily   memantine  10 mg Oral BID   nirmatrelvir/ritonavir EUA (renal dosing)  2 tablet Oral BID   pantoprazole  80 mg Oral Daily   pyridostigmine  60 mg Oral BID   Continuous Infusions:  cefTRIAXone (ROCEPHIN)  IV Stopped (03/22/21 2341)   heparin 1,050 Units/hr (03/23/21 0931)   PRN Meds: acetaminophen **OR** acetaminophen, ondansetron **OR** ondansetron (ZOFRAN) IV, senna-docusate, zolpidem  Allergies:    Allergies  Allergen Reactions   Pravastatin Other (See Comments)   Simvastatin Other (See Comments)    Social History:   Social History   Socioeconomic History   Marital status: Married    Spouse name: Crystal   Number of children: 3   Years of education: 16   Highest education level: Not on file  Occupational History    Comment: retired  Tobacco Use   Smoking status: Former    Types: Cigarettes    Quit date: 04/06/1970    Years since quitting: 50.9   Smokeless tobacco: Former  Scientific laboratory technician Use: Never used  Substance and Sexual Activity   Alcohol use: Yes    Alcohol/week: 0.0 standard drinks    Comment: Consumes alcohol on occasion   Drug use: No   Sexual activity: Not on file  Other Topics Concern   Not on file  Social History Narrative   Patient lives at home with his wife Veterinary surgeon)   Retired - [employer]   [place]   Right handed.   Caffeine- four cups daily.            Social Determinants of Health   Financial Resource Strain: Low Risk    Difficulty of Paying Living Expenses: Not hard at all  Food Insecurity: No Food Insecurity   Worried About Charity fundraiser in the Last Year: Never true   [place] in the Last Year: Never true  Transportation Needs: No Transportation Needs   Lack of Transportation (Medical): No   Lack of Transportation (Non-Medical): No   Physical Activity: Inactive   Days of Exercise per Week: 0 days   Minutes of Exercise per Session: 0 min  Stress: No Stress Concern Present   Feeling of Stress : Not at all  Social Connections: Not on file  Intimate Partner Violence: Not on file    Family History:    Family History  Problem Relation Age of Onset   Heart attack Father      ROS:  Please see the history of present illness.   All other ROS reviewed and negative.     Physical Exam/Data:  Vitals:   03/23/21 1000 03/23/21 1015 03/23/21 1030 03/23/21 1045  BP: (!) 162/74 (!) 146/77 (!) 155/76 (!) 161/73  Pulse: (!) 102 (!) 103 (!) 104 (!) 106  Resp: (!) 29 10 (!) 30 20  Temp:      TempSrc:      SpO2: 92% 94% 94% 94%  Weight:      Height:        Intake/Output Summary (Last 24 hours) at 03/23/2021 1115 Last data filed at 03/23/2021 0534 Gross per 24 hour  Intake 888.62 ml  Output 370 ml  Net 518.62 ml   Last 3 Weights 03/22/2021 12/26/2020 12/17/2020  Weight (lbs) 196 lb 3.4 oz 194 lb 9.6 oz 192 lb  Weight (kg) 89 kg 88.27 kg 87.091 kg     Body mass index is 28.98 kg/m.  General:  elderly appering, Well nourished, well developed, in no acute distress HEENT: normal Neck: no JVD Vascular: No carotid bruits; Distal pulses 2+ bilaterally Cardiac:  normal S1, S2; IRIRR; no murmur  Lungs:  clear to auscultation bilaterally, no wheezing, rhonchi or rales  Abd: soft, nontender, no hepatomegaly  Ext: no edema Musculoskeletal:  No deformities, BUE and BLE strength normal and equal Skin: warm and dry  Neuro:  CNs 2-12 intact, no focal abnormalities noted Psych:  Normal affect   EKG:  The EKG was personally reviewed and demonstrates:  atrial fib with a controlled Vr Telemetry:  Telemetry was personally reviewed and demonstrates:  atrial fib with a controlled VR  Relevant CV Studies: none  Laboratory Data:  High Sensitivity Troponin:   Recent Labs  Lab 03/22/21 2052 03/23/21 0042  TROPONINIHS 497*  422*     Chemistry Recent Labs  Lab 03/22/21 1432 03/23/21 0437  NA 133* 136  K 4.2 4.1  CL 100 103  CO2 25 26  GLUCOSE 114* 88  BUN 22 25*  CREATININE 1.54* 1.57*  CALCIUM 9.0 8.3*  GFRNONAA 44* 43*  ANIONGAP 8 7    Recent Labs  Lab 03/22/21 1432  PROT 7.0  ALBUMIN 3.7  AST 23  ALT 19  ALKPHOS 64  BILITOT 1.6*   Lipids No results for input(s): CHOL, TRIG, HDL, LABVLDL, LDLCALC, CHOLHDL in the last 168 hours.  Hematology Recent Labs  Lab 03/22/21 1432 03/23/21 0437  WBC 10.9* 7.8  RBC 4.71 4.15*  HGB 15.2 13.6  HCT 44.9 39.8  MCV 95.3 95.9  MCH 32.3 32.8  MCHC 33.9 34.2  RDW 12.9 13.3  PLT 180 161   Thyroid No results for input(s): TSH, FREET4 in the last 168 hours.  BNP Recent Labs  Lab 03/22/21 1432  BNP 100.7*    DDimer  Recent Labs  Lab 03/23/21 0838  DDIMER 1.42*     Radiology/Studies:  DG Chest 1 View  Result Date: 03/22/2021 CLINICAL DATA:  Weakness. EXAM: CHEST  1 VIEW COMPARISON:  March 31, 2018 FINDINGS: The heart size and mediastinal contours are within normal limits. Both lungs are clear. The visualized skeletal structures are unremarkable. IMPRESSION: No active disease. Electronically Signed   By: Dorise Bullion III M.D.   On: 03/22/2021 16:46   CT Head Wo Contrast  Result Date: 03/22/2021 CLINICAL DATA:  Head trauma EXAM: CT HEAD WITHOUT CONTRAST TECHNIQUE: Contiguous axial images were obtained from the base of the skull through the vertex without intravenous contrast. COMPARISON:  CT head 06/06/2007 FINDINGS: Brain: No acute intracranial hemorrhage, mass effect, or herniation. No extra-axial fluid collections. No evidence of acute territorial  infarct. No hydrocephalus. Mild cortical volume loss. Extensive hypodensities throughout the periventricular and subcortical white matter, likely secondary to chronic microvascular ischemic changes. Vascular: Calcified plaques in the carotid siphons. Skull: Normal. Negative for fracture or  focal lesion. Sinuses/Orbits: Small air-fluid levels in the bilateral maxillary sinuses and mild mucosal thickening throughout the paranasal sinuses. Other: None. IMPRESSION: 1. No acute intracranial process identified. 2. Advanced chronic microvascular ischemic changes. 3. Small air-fluid levels in the maxillary sinuses, correlate for evidence of acute sinusitis. Electronically Signed   By: Ofilia Neas M.D.   On: 03/22/2021 15:25   CT Cervical Spine Wo Contrast  Result Date: 03/22/2021 CLINICAL DATA:  Neck trauma EXAM: CT CERVICAL SPINE WITHOUT CONTRAST TECHNIQUE: Multidetector CT imaging of the cervical spine was performed without intravenous contrast. Multiplanar CT image reconstructions were also generated. COMPARISON:  None. FINDINGS: Alignment: Grade 1 anterolisthesis of C4 on C5. Skull base and vertebrae: No acute fracture. No primary bone lesion or focal pathologic process. Soft tissues and spinal canal: No prevertebral fluid or swelling. No visible canal hematoma. Disc levels: Severe intervertebral disc space narrowing at C5-C6 with endplate sclerosis, uncovertebral spurring and prominent dorsal endplate osteophytes. Moderate to severe disc space narrowing at C6-C7 and C7-T1 with small dorsal endplate osteophytes. Bilateral facet arthropathy. Multilevel neural foraminal narrowing most significant at C3-C4 and C5-C6. Multilevel spinal canal stenoses most significant at C5-C6. Upper chest: No acute process visualized. Other: Calcified plaques in the carotid arteries. IMPRESSION: Multilevel degenerative changes of the cervical spine with no acute fracture or subluxation identified. Electronically Signed   By: Ofilia Neas M.D.   On: 03/22/2021 15:29     Assessment and Plan:   Syncope - he has an ILR. He will need to have this interrogated tomorrow. He has evidence of conduction system disease. We will see if he is having pauses. Paroxsmal atrial fib- he has been on eliquis since 2018.   HTN - his bp is up a bit. He will need some rate control. As he does not appear to feel his atrial fib, I would recommend rate control alone.   Risk Assessment/Risk Scores:      CHA2DS2-VASc Score =   4  This indicates a  % annual risk of stroke. The patient's score is based upon:      For questions or updates, please contact [place] Please consult [email or web address] for contact info under   Signed, Cristopher Peru, MD  03/23/2021 11:15 AM

## 2021-03-23 NOTE — ED Notes (Signed)
Main Lab at bedside.

## 2021-03-23 NOTE — ED Notes (Signed)
Pt's wife provided an update and informed TOC/SW should be contacting her soon.  Pt told her that PT had not come to assess him.  Wife reassured PT had already come and wife informed of recommendation.  Secure chat sent to TOC/SW.

## 2021-03-23 NOTE — Progress Notes (Addendum)
PROGRESS NOTE    Elijah Durkee Sr.  AFB:[id number] DOB: 1935-03-19 DOA: 03/22/2021 PCP: Olin Hauser, DO   Brief Narrative: Taken from H&P. Elijah Ponder Sr. is a 86 y.o. male with medical history significant for myasthenia gravis, dementia, hyperlipidemia, hypertension, GERD, OSA, glaucoma, insomnia, who presents to the emergency department for chief concerns of syncopal event. Patient unable to participate with review of system or giving any meaningful history due to underlying dementia. He was found to have temperature of 102.1, otherwise stable vitals. Tested positive for COVID-19 UA was also concerning with some hematuria and leukocytosis-urine cultures pending.  Preliminary blood cultures negative. PT is recommending SNF placement. 1/4, it will be a black culture with gram-positive cocci, staph species, most likely a contaminant-we will repeat blood culture  Subjective: Patient was seen and examined today.  He was oriented to self only, stating that he is at home.  He was not answering any of the questions appropriately.  Per wife he is little more confused than his baseline.  Assessment & Plan:   Principal Problem:   Syncope Active Problems:   Dizziness and giddiness   Essential hypertension   HLD (hyperlipidemia)   GERD (gastroesophageal reflux disease)   OSA (obstructive sleep apnea)  Syncope.  History of prior syncopal episodes.  Most likely multifactorial with recent COVID-19 infection versus UTI. Patient had loop recorder in place-cardiology will investigate tomorrow for any arrhythmia. -PT is recommending SNF  COVID-19 infection.  Elevated inflammatory markers.  He was started on remdesivir which was later transitioned to [place]. Remained on room air. -Continue with Paxlovid for 5 days -Keep holding statin for 5 days while he remains on antiviral. -Add prednisone as that will also help with his worsening fatigue which can be related to myasthenia  gravis exacerbation. -Continue with supportive care  Concern of UTI.  Unable to explain any symptoms.  UA with some hematuria and leukocytosis.  He was started on ceftriaxone -continue with ceftriaxone -F/U urine cultures.  History of CAD/elevated troponin.  No chest pain.  Mildly positive troponin with a flat curve.  Most likely secondary to demand ischemia. Patient was initially started on heparin infusion for concern of NSTEMI.  Cardiology was also consulted. -Stop heparin infusion -Restart home Eliquis  History of paroxysmal atrial fibrillation.  Rate little variable. -Restart home Eliquis as we are discontinuing heparin infusion.  History of myasthenia gravis. -Continue home dose of pyridostigmine  Hypertension.  Blood pressure within goal. -Continue home dose of lisinopril  History of dementia. -Continue home dose of donepezil and memantine.  History of insomnia. -Continue home dose of zolpidem as needed.  Objective: Vitals:   03/23/21 1350 03/23/21 1415 03/23/21 1430 03/23/21 1445  BP: 135/88 115/64 134/63 120/73  Pulse: (!) 104 67 (!) 101 (!) 112  Resp: (!) 26 (!) 28 (!) 22 19  Temp:      TempSrc:      SpO2: 96% 94% 97% 92%  Weight:      Height:        Intake/Output Summary (Last 24 hours) at 03/23/2021 1537 Last data filed at 03/23/2021 0534 Gross per 24 hour  Intake 888.62 ml  Output 370 ml  Net 518.62 ml   Filed Weights   03/22/21 1419  Weight: 89 kg    Examination:  General exam: Appears calm and comfortable  Respiratory system: Clear to auscultation. Respiratory effort normal. Cardiovascular system: S1 & S2 heard, RRR.  Gastrointestinal system: Soft, nontender, nondistended, bowel sounds positive. Central nervous system:  Alert and oriented to self only. No focal neurological deficits. Extremities: No edema, no cyanosis, pulses intact and symmetrical. Psychiatry: Judgement and insight appear impaired.  DVT prophylaxis: Eliquis Code Status:  Full Family Communication: Discussed with wife at bedside Disposition Plan:  Status is: Inpatient  Remains inpatient appropriate because: Severity of illness   Level of care: Telemetry Medical  All the records are reviewed and case discussed with Care Management/Social Worker. Management plans discussed with the patient, nursing and they are in agreement.  Consultants:  Cardiology  Procedures:  Antimicrobials:  Ceftriaxone  Data Reviewed: I have personally reviewed following labs and imaging studies  CBC: Recent Labs  Lab 03/22/21 1432 03/23/21 0437  WBC 10.9* 7.8  NEUTROABS 9.0*  --   HGB 15.2 13.6  HCT 44.9 39.8  MCV 95.3 95.9  PLT 180 332   Basic Metabolic Panel: Recent Labs  Lab 03/22/21 1432 03/23/21 0437  NA 133* 136  K 4.2 4.1  CL 100 103  CO2 25 26  GLUCOSE 114* 88  BUN 22 25*  CREATININE 1.54* 1.57*  CALCIUM 9.0 8.3*   GFR: Estimated Creatinine Clearance: 38.6 mL/min (A) (by C-G formula based on SCr of 1.57 mg/dL (H)). Liver Function Tests: Recent Labs  Lab 03/22/21 1432  AST 23  ALT 19  ALKPHOS 64  BILITOT 1.6*  PROT 7.0  ALBUMIN 3.7   No results for input(s): LIPASE, AMYLASE in the last 168 hours. No results for input(s): AMMONIA in the last 168 hours. Coagulation Profile: Recent Labs  Lab 03/22/21 1432  INR 1.3*   Cardiac Enzymes: Recent Labs  Lab 03/22/21 2052 03/23/21 0838  CKTOTAL 537* 605*   BNP (last 3 results) No results for input(s): PROBNP in the last 8760 hours. HbA1C: No results for input(s): HGBA1C in the last 72 hours. CBG: No results for input(s): GLUCAP in the last 168 hours. Lipid Profile: No results for input(s): CHOL, HDL, LDLCALC, TRIG, CHOLHDL, LDLDIRECT in the last 72 hours. Thyroid Function Tests: No results for input(s): TSH, T4TOTAL, FREET4, T3FREE, THYROIDAB in the last 72 hours. Anemia Panel: Recent Labs    03/23/21 0838  FERRITIN 260   Sepsis Labs: Recent Labs  Lab 03/22/21 2052  03/22/21 2243  LATICACIDVEN 1.4 1.5    Recent Results (from the past 240 hour(s))  Resp Panel by RT-PCR (Flu A&B, Covid) Nasopharyngeal Swab     Status: Abnormal   Collection Time: 03/22/21  8:52 PM   Specimen: Nasopharyngeal Swab; Nasopharyngeal(NP) swabs in vial transport medium  Result Value Ref Range Status   SARS Coronavirus 2 by RT PCR POSITIVE (A) NEGATIVE Final    Comment: (NOTE) SARS-CoV-2 target nucleic acids are DETECTED.  The SARS-CoV-2 RNA is generally detectable in upper respiratory specimens during the acute phase of infection. Positive results are indicative of the presence of the identified virus, but do not rule out bacterial infection or co-infection with other pathogens not detected by the test. Clinical correlation with patient history and other diagnostic information is necessary to determine patient infection status. The expected result is Negative.  Fact Sheet for Patients: EntrepreneurPulse.com.au  Fact Sheet for Healthcare Providers: IncredibleEmployment.be  This test is not yet approved or cleared by the [place] FDA and  has been authorized for detection and/or diagnosis of SARS-CoV-2 by FDA under an Emergency Use Authorization (EUA).  This EUA will remain in effect (meaning this test can be used) for the duration of  the COVID-19 declaration under Section 564(b)(1) of the A ct,  21 U.S.C. section 360bbb-3(b)(1), unless the authorization is terminated or revoked sooner.     Influenza A by PCR NEGATIVE NEGATIVE Final   Influenza B by PCR NEGATIVE NEGATIVE Final    Comment: (NOTE) The Xpert Xpress SARS-CoV-2/FLU/RSV plus assay is intended as an aid in the diagnosis of influenza from Nasopharyngeal swab specimens and should not be used as a sole basis for treatment. Nasal washings and aspirates are unacceptable for Xpert Xpress SARS-CoV-2/FLU/RSV testing.  Fact Sheet for  Patients: EntrepreneurPulse.com.au  Fact Sheet for Healthcare Providers: IncredibleEmployment.be  This test is not yet approved or cleared by the [place] FDA and has been authorized for detection and/or diagnosis of SARS-CoV-2 by FDA under an Emergency Use Authorization (EUA). This EUA will remain in effect (meaning this test can be used) for the duration of the COVID-19 declaration under Section 564(b)(1) of the Act, 21 U.S.C. section 360bbb-3(b)(1), unless the authorization is terminated or revoked.  Performed at [facility], [address] [place], [place] [address]   Blood Culture (routine x 2)     Status: None (Preliminary result)   Collection Time: 03/22/21  8:52 PM   Specimen: BLOOD  Result Value Ref Range Status   Specimen Description BLOOD LEFT ANTECUBITAL  Final   Special Requests   Final    BOTTLES DRAWN AEROBIC AND ANAEROBIC Blood Culture adequate volume   Culture   Final    NO GROWTH < 12 HOURS Performed at [facility], [address] [place], [place] [address]    Report Status PENDING  Incomplete  Blood Culture (routine x 2)     Status: None (Preliminary result)   Collection Time: 03/22/21  9:00 PM   Specimen: BLOOD  Result Value Ref Range Status   Specimen Description BLOOD RIGHT ANTECUBITAL  Final   Special Requests   Final    BOTTLES DRAWN AEROBIC AND ANAEROBIC Blood Culture adequate volume   Culture  Setup Time Organism ID to follow  Final   Culture   Final    NO GROWTH < 12 HOURS Performed at [facility], [address] [place], [place] [address]    Report Status PENDING  Incomplete     Radiology Studies: DG Chest 1 View  Result Date: 03/22/2021 CLINICAL DATA:  Weakness. EXAM: CHEST  1 VIEW COMPARISON:  March 31, 2018 FINDINGS: The heart size and mediastinal contours are within normal limits. Both lungs are clear. The visualized skeletal structures are unremarkable.  IMPRESSION: No active disease. Electronically Signed   By: Dorise Bullion III M.D.   On: 03/22/2021 16:46   CT Head Wo Contrast  Result Date: 03/22/2021 CLINICAL DATA:  Head trauma EXAM: CT HEAD WITHOUT CONTRAST TECHNIQUE: Contiguous axial images were obtained from the base of the skull through the vertex without intravenous contrast. COMPARISON:  CT head 06/06/2007 FINDINGS: Brain: No acute intracranial hemorrhage, mass effect, or herniation. No extra-axial fluid collections. No evidence of acute territorial infarct. No hydrocephalus. Mild cortical volume loss. Extensive hypodensities throughout the periventricular and subcortical white matter, likely secondary to chronic microvascular ischemic changes. Vascular: Calcified plaques in the carotid siphons. Skull: Normal. Negative for fracture or focal lesion. Sinuses/Orbits: Small air-fluid levels in the bilateral maxillary sinuses and mild mucosal thickening throughout the paranasal sinuses. Other: None. IMPRESSION: 1. No acute intracranial process identified. 2. Advanced chronic microvascular ischemic changes. 3. Small air-fluid levels in the maxillary sinuses, correlate for evidence of acute sinusitis. Electronically Signed   By: Tiburcio Pea.D.  On: 03/22/2021 15:25   CT Cervical Spine Wo Contrast  Result Date: 03/22/2021 CLINICAL DATA:  Neck trauma EXAM: CT CERVICAL SPINE WITHOUT CONTRAST TECHNIQUE: Multidetector CT imaging of the cervical spine was performed without intravenous contrast. Multiplanar CT image reconstructions were also generated. COMPARISON:  None. FINDINGS: Alignment: Grade 1 anterolisthesis of C4 on C5. Skull base and vertebrae: No acute fracture. No primary bone lesion or focal pathologic process. Soft tissues and spinal canal: No prevertebral fluid or swelling. No visible canal hematoma. Disc levels: Severe intervertebral disc space narrowing at C5-C6 with endplate sclerosis, uncovertebral spurring and prominent dorsal  endplate osteophytes. Moderate to severe disc space narrowing at C6-C7 and C7-T1 with small dorsal endplate osteophytes. Bilateral facet arthropathy. Multilevel neural foraminal narrowing most significant at C3-C4 and C5-C6. Multilevel spinal canal stenoses most significant at C5-C6. Upper chest: No acute process visualized. Other: Calcified plaques in the carotid arteries. IMPRESSION: Multilevel degenerative changes of the cervical spine with no acute fracture or subluxation identified. Electronically Signed   By: Ofilia Neas M.D.   On: 03/22/2021 15:29    Scheduled Meds:  apixaban  5 mg Oral BID   donepezil  10 mg Oral QHS   feeding supplement  237 mL Oral BID BM   latanoprost  1 drop Both Eyes QHS   lisinopril  20 mg Oral Daily   memantine  10 mg Oral BID   nirmatrelvir/ritonavir EUA (renal dosing)  2 tablet Oral BID   pantoprazole  80 mg Oral Daily   pyridostigmine  60 mg Oral BID   Continuous Infusions:  cefTRIAXone (ROCEPHIN)  IV Stopped (03/22/21 2341)     LOS: 0 days   Time spent: 40 minutes. More than 50% of the time was spent in counseling/coordination of care  Lorella Nimrod, MD [facility]  If 7PM-7AM, please contact night-coverage [email or web address]  03/23/2021, 3:37 PM   This record has been created using Systems analyst. Errors have been sought and corrected,but may not always be located. Such creation errors do not reflect on the standard of care.

## 2021-03-23 NOTE — ED Notes (Signed)
Wife speaking to Hospitalist on the phone.

## 2021-03-23 NOTE — ED Notes (Signed)
Pt's wife called this RN into the room reporting "he is bleeding everywhere."  Pt noted to have pulled out IV.  Pt sts "I was trying to get out of bed to use the restroom."  Pt reoriented to the fact he has a catheter.  Linens and gown changed.  New IV started.

## 2021-03-24 ENCOUNTER — Inpatient Hospital Stay: Payer: No Typology Code available for payment source

## 2021-03-24 ENCOUNTER — Encounter: Payer: Self-pay | Admitting: Internal Medicine

## 2021-03-24 LAB — CBC
HCT: 40.1 % (ref 39.0–52.0)
Hemoglobin: 13.7 g/dL (ref 13.0–17.0)
MCH: 31.8 pg (ref 26.0–34.0)
MCHC: 34.2 g/dL (ref 30.0–36.0)
MCV: 93 fL (ref 80.0–100.0)
Platelets: 170 10*3/uL (ref 150–400)
RBC: 4.31 MIL/uL (ref 4.22–5.81)
RDW: 13 % (ref 11.5–15.5)
WBC: 7.6 10*3/uL (ref 4.0–10.5)
nRBC: 0 % (ref 0.0–0.2)

## 2021-03-24 LAB — URINE CULTURE: Culture: NO GROWTH

## 2021-03-24 LAB — CK: Total CK: 269 U/L (ref 49–397)

## 2021-03-24 LAB — PROCALCITONIN: Procalcitonin: 1.84 ng/mL

## 2021-03-24 NOTE — ED Notes (Signed)
Cardiology at bedside.

## 2021-03-24 NOTE — ED Notes (Signed)
Informed RN bed assigned 

## 2021-03-24 NOTE — ED Notes (Signed)
Lab in room at this time. 

## 2021-03-24 NOTE — Progress Notes (Signed)
PROGRESS NOTE    Elijah Monje Sr.  DGU:[id number] DOB: 28-Oct-1934 DOA: 03/22/2021 PCP: Olin Hauser, DO   Brief Narrative: Taken from H&P. Elijah Ponder Sr. is a 86 y.o. male with medical history significant for myasthenia gravis, dementia, hyperlipidemia, hypertension, GERD, OSA, glaucoma, insomnia, who presents to the emergency department for chief concerns of syncopal event. Patient unable to participate with review of system or giving any meaningful history due to underlying dementia. He was found to have temperature of 102.1, otherwise stable vitals. Tested positive for COVID-19 UA was also concerning with some hematuria and leukocytosis-urine cultures pending.  Preliminary blood cultures negative. PT is recommending SNF placement. 1/4, it will be a black culture with gram-positive cocci, staph species, most likely a contaminant-we will repeat blood culture.  12/19: Repeat blood cultures remain negative.  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  History of Morbitz type I and II, also seen to have A. fib on this interrogation.  Cardiology is recommending EP evaluation for permanent pacemaker placement, if they cannot get to him before discharge they will arrange outpatient follow-up. PT is recommending SNF  Subjective: Patient was seen and examined today.  Continues to feel weak and wife was very concerned about his walking as she cannot take care of him if he cannot walk.  No other complaints.  Denies any shortness of breath or chest pain.  Assessment & Plan:   Principal Problem:   Syncope and collapse Active Problems:   Dizziness and giddiness   Essential hypertension   HLD (hyperlipidemia)   GERD (gastroesophageal reflux disease)   OSA (obstructive sleep apnea)  Syncope.  History of prior syncopal episodes.  Most likely multifactorial with recent COVID-19 infection versus UTI, urine cultures negative. Patient had loop recorder in  place-and interrogation did show 2 episodes of A. fib.  Has a longstanding history of morbidz type I and II.  Concern of tacky and bradycardia syndrome.  Cardiology is recommending EP evaluation, which can be done as an outpatient if he is ready to discharge before they can see him.  He might need permanent pacemaker. -PT is recommending SNF -TOC consult  COVID-19 infection.  Elevated inflammatory markers.  He was started on remdesivir which was later transitioned to [place]. Remained on room air. -Continue with Paxlovid for 5 days -Keep holding statin for 5 days while he remains on antiviral. -Add prednisone as that will also help with his worsening fatigue which can be related to myasthenia gravis exacerbation. -Continue with supportive care  Concern of UTI.  Unable to explain any symptoms.  UA with some hematuria and leukocytosis.  He was started on ceftriaxone Urine cultures negative. -DC ceftriaxone  Concern of urinary retention.  Foley was placed in triage with concern of urinary retention.  No proper documentation.  Per wife he did not have any urinary problems, except he cannot stand up for urination. -DC Foley catheter -Give him a voiding trial  History of CAD/elevated troponin.  No chest pain.  Mildly positive troponin with a flat curve.  Most likely secondary to demand ischemia. Patient was initially started on heparin infusion for concern of NSTEMI.  Cardiology was also consulted.  Heparin infusion was discontinued next morning -Continue home Eliquis  History of paroxysmal atrial fibrillation.  Rate little variable. -Restarted home Eliquis   History of myasthenia gravis. -Continue home dose of pyridostigmine  Hypertension.  Blood pressure within goal. -Continue home dose of lisinopril  History of dementia. -Continue home dose of donepezil  and memantine.  History of insomnia. -Continue home dose of zolpidem as needed.  Objective: Vitals:   03/24/21 0700 03/24/21  0959 03/24/21 1000 03/24/21 1230  BP: 120/74 99/74 118/73 115/72  Pulse: 76  88 77  Resp: 19  18 17   Temp:      TempSrc:      SpO2: 95%  92% 93%  Weight:      Height:       No intake or output data in the 24 hours ending 03/24/21 1353  Filed Weights   03/22/21 1419  Weight: 89 kg    Examination:  General.  Well-developed elderly man, in no acute distress. Pulmonary.  Lungs clear bilaterally, normal respiratory effort. CV.  Regular rate and rhythm, no JVD, rub or murmur. Abdomen.  Soft, nontender, nondistended, BS positive. CNS.  Alert and oriented .  No focal neurologic deficit. Extremities.  No edema, no cyanosis, pulses intact and symmetrical. Psychiatry.  Appears to have mild cognitive impairment  DVT prophylaxis: Eliquis Code Status: Full Family Communication: Discussed with wife at bedside Disposition Plan:  Status is: Inpatient  Remains inpatient appropriate because: Severity of illness   Level of care: Telemetry Medical  All the records are reviewed and case discussed with Care Management/Social Worker. Management plans discussed with the patient, nursing and they are in agreement.  Consultants:  Cardiology  Procedures:  Antimicrobials:   Data Reviewed: I have personally reviewed following labs and imaging studies  CBC: Recent Labs  Lab 03/22/21 1432 03/23/21 0437 03/24/21 0658  WBC 10.9* 7.8 7.6  NEUTROABS 9.0*  --   --   HGB 15.2 13.6 13.7  HCT 44.9 39.8 40.1  MCV 95.3 95.9 93.0  PLT 180 161 916    Basic Metabolic Panel: Recent Labs  Lab 03/22/21 1432 03/23/21 0437  NA 133* 136  K 4.2 4.1  CL 100 103  CO2 25 26  GLUCOSE 114* 88  BUN 22 25*  CREATININE 1.54* 1.57*  CALCIUM 9.0 8.3*    GFR: Estimated Creatinine Clearance: 38.6 mL/min (A) (by C-G formula based on SCr of 1.57 mg/dL (H)). Liver Function Tests: Recent Labs  Lab 03/22/21 1432  AST 23  ALT 19  ALKPHOS 64  BILITOT 1.6*  PROT 7.0  ALBUMIN 3.7    No results for  input(s): LIPASE, AMYLASE in the last 168 hours. No results for input(s): AMMONIA in the last 168 hours. Coagulation Profile: Recent Labs  Lab 03/22/21 1432  INR 1.3*    Cardiac Enzymes: Recent Labs  Lab 03/22/21 2052 03/23/21 0838 03/24/21 0659  CKTOTAL 537* 605* 269    BNP (last 3 results) No results for input(s): PROBNP in the last 8760 hours. HbA1C: No results for input(s): HGBA1C in the last 72 hours. CBG: No results for input(s): GLUCAP in the last 168 hours. Lipid Profile: No results for input(s): CHOL, HDL, LDLCALC, TRIG, CHOLHDL, LDLDIRECT in the last 72 hours. Thyroid Function Tests: No results for input(s): TSH, T4TOTAL, FREET4, T3FREE, THYROIDAB in the last 72 hours. Anemia Panel: Recent Labs    03/23/21 0838  FERRITIN 260    Sepsis Labs: Recent Labs  Lab 03/22/21 2052 03/22/21 2243 03/24/21 0659  PROCALCITON  --   --  1.84  LATICACIDVEN 1.4 1.5  --      Recent Results (from the past 240 hour(s))  Resp Panel by RT-PCR (Flu A&B, Covid) Nasopharyngeal Swab     Status: Abnormal   Collection Time: 03/22/21  8:52 PM   Specimen: Nasopharyngeal  Swab; Nasopharyngeal(NP) swabs in vial transport medium  Result Value Ref Range Status   SARS Coronavirus 2 by RT PCR POSITIVE (A) NEGATIVE Final    Comment: (NOTE) SARS-CoV-2 target nucleic acids are DETECTED.  The SARS-CoV-2 RNA is generally detectable in upper respiratory specimens during the acute phase of infection. Positive results are indicative of the presence of the identified virus, but do not rule out bacterial infection or co-infection with other pathogens not detected by the test. Clinical correlation with patient history and other diagnostic information is necessary to determine patient infection status. The expected result is Negative.  Fact Sheet for Patients: EntrepreneurPulse.com.au  Fact Sheet for Healthcare Providers: IncredibleEmployment.be  This  test is not yet approved or cleared by the [place] FDA and  has been authorized for detection and/or diagnosis of SARS-CoV-2 by FDA under an Emergency Use Authorization (EUA).  This EUA will remain in effect (meaning this test can be used) for the duration of  the COVID-19 declaration under Section 564(b)(1) of the A ct, 21 U.S.C. section 360bbb-3(b)(1), unless the authorization is terminated or revoked sooner.     Influenza A by PCR NEGATIVE NEGATIVE Final   Influenza B by PCR NEGATIVE NEGATIVE Final    Comment: (NOTE) The Xpert Xpress SARS-CoV-2/FLU/RSV plus assay is intended as an aid in the diagnosis of influenza from Nasopharyngeal swab specimens and should not be used as a sole basis for treatment. Nasal washings and aspirates are unacceptable for Xpert Xpress SARS-CoV-2/FLU/RSV testing.  Fact Sheet for Patients: EntrepreneurPulse.com.au  Fact Sheet for Healthcare Providers: IncredibleEmployment.be  This test is not yet approved or cleared by the [place] FDA and has been authorized for detection and/or diagnosis of SARS-CoV-2 by FDA under an Emergency Use Authorization (EUA). This EUA will remain in effect (meaning this test can be used) for the duration of the COVID-19 declaration under Section 564(b)(1) of the Act, 21 U.S.C. section 360bbb-3(b)(1), unless the authorization is terminated or revoked.  Performed at [facility], [address] [place], [place] [address]   Blood Culture (routine x 2)     Status: None (Preliminary result)   Collection Time: 03/22/21  8:52 PM   Specimen: BLOOD  Result Value Ref Range Status   Specimen Description BLOOD LEFT ANTECUBITAL  Final   Special Requests   Final    BOTTLES DRAWN AEROBIC AND ANAEROBIC Blood Culture adequate volume   Culture  Setup Time   Final    GRAM POSITIVE COCCI ANAEROBIC BOTTLE ONLY Performed at [facility], [address] [place],  [place] [address]    Culture [facility] POSITIVE COCCI  Final   Report Status PENDING  Incomplete  Urine Culture     Status: None   Collection Time: 03/22/21  8:52 PM   Specimen: In/Out Cath Urine  Result Value Ref Range Status   Specimen Description   Final    IN/OUT CATH URINE Performed at [facility], [address], [place], [place] [address]    Special Requests   Final    NONE Performed at [facility], [address] [place], [place] [address]    Culture   Final    NO GROWTH Performed at [facility], [place] [address], [place], [place] [address]    Report Status 03/24/2021 FINAL  Final  Blood Culture (routine x 2)     Status: Abnormal (Preliminary result)   Collection Time: 03/22/21  9:00 PM   Specimen: BLOOD  Result Value Ref  Range Status   Specimen Description   Final    BLOOD RIGHT ANTECUBITAL Performed at [facility], [address] [place], [place] [address]    Special Requests   Final    BOTTLES DRAWN AEROBIC AND ANAEROBIC Blood Culture adequate volume Performed at [facility], [address] [place], [place] [address]    Culture  Setup Time   Final    Organism ID to follow GRAM POSITIVE COCCI IN BOTH AEROBIC AND ANAEROBIC BOTTLES CRITICAL RESULT CALLED TO, READ BACK BY AND VERIFIED WITH: SAMANTHA RAUER AT 0814 03/23/21.PMF Performed at [facility], [address] [place], [place] [address]    Culture STAPHYLOCOCCUS CAPITIS (A)  Final   Report Status PENDING  Incomplete  Blood Culture ID Panel (Reflexed)     Status: Abnormal   Collection Time: 03/22/21  9:00 PM  Result Value Ref Range Status   Enterococcus faecalis NOT DETECTED NOT DETECTED Final   Enterococcus Faecium NOT DETECTED NOT DETECTED Final   Listeria monocytogenes NOT DETECTED NOT DETECTED Final   Staphylococcus species DETECTED (A) NOT DETECTED Final    Comment: CRITICAL RESULT CALLED TO, READ BACK BY AND VERIFIED WITH: SAMANTHA RAUER AT 1603  03/23/21.PMF    Staphylococcus aureus (BCID) NOT DETECTED NOT DETECTED Final   Staphylococcus epidermidis NOT DETECTED NOT DETECTED Final   Staphylococcus lugdunensis NOT DETECTED NOT DETECTED Final   Streptococcus species NOT DETECTED NOT DETECTED Final   Streptococcus agalactiae NOT DETECTED NOT DETECTED Final   Streptococcus pneumoniae NOT DETECTED NOT DETECTED Final   Streptococcus pyogenes NOT DETECTED NOT DETECTED Final   A.calcoaceticus-baumannii NOT DETECTED NOT DETECTED Final   Bacteroides fragilis NOT DETECTED NOT DETECTED Final   Enterobacterales NOT DETECTED NOT DETECTED Final   Enterobacter cloacae complex NOT DETECTED NOT DETECTED Final   Escherichia coli NOT DETECTED NOT DETECTED Final   Klebsiella aerogenes NOT DETECTED NOT DETECTED Final   Klebsiella oxytoca NOT DETECTED NOT DETECTED Final   Klebsiella pneumoniae NOT DETECTED NOT DETECTED Final   Proteus species NOT DETECTED NOT DETECTED Final   Salmonella species NOT DETECTED NOT DETECTED Final   Serratia marcescens NOT DETECTED NOT DETECTED Final   Haemophilus influenzae NOT DETECTED NOT DETECTED Final   Neisseria meningitidis NOT DETECTED NOT DETECTED Final   Pseudomonas aeruginosa NOT DETECTED NOT DETECTED Final   Stenotrophomonas maltophilia NOT DETECTED NOT DETECTED Final   Candida albicans NOT DETECTED NOT DETECTED Final   Candida auris NOT DETECTED NOT DETECTED Final   Candida glabrata NOT DETECTED NOT DETECTED Final   Candida krusei NOT DETECTED NOT DETECTED Final   Candida parapsilosis NOT DETECTED NOT DETECTED Final   Candida tropicalis NOT DETECTED NOT DETECTED Final   Cryptococcus neoformans/gattii NOT DETECTED NOT DETECTED Final    Comment: Performed at [facility], [address] [place], [place] [address]  CULTURE, BLOOD (ROUTINE X 2) w Reflex to ID Panel     Status: None (Preliminary result)   Collection Time: 03/23/21  4:33 PM   Specimen: BLOOD  Result Value Ref Range Status    Specimen Description BLOOD BLOOD RIGHT ARM  Final   Special Requests   Final    BOTTLES DRAWN AEROBIC AND ANAEROBIC Blood Culture adequate volume   Culture   Final    NO GROWTH < 24 HOURS Performed at [facility], [address] [place],  [address]    Report Status PENDING  Incomplete  CULTURE, BLOOD (ROUTINE X 2) w Reflex to ID Panel  Status: None (Preliminary result)   Collection Time: 03/23/21  4:33 PM   Specimen: BLOOD  Result Value Ref Range Status   Specimen Description BLOOD RIGHT ANTECUBITAL  Final   Special Requests   Final    BOTTLES DRAWN AEROBIC AND ANAEROBIC Blood Culture adequate volume   Culture   Final    NO GROWTH < 24 HOURS Performed at [facility], [address] [place], [place] [address]    Report Status PENDING  Incomplete      Radiology Studies: DG Chest 1 View  Result Date: 03/22/2021 CLINICAL DATA:  Weakness. EXAM: CHEST  1 VIEW COMPARISON:  March 31, 2018 FINDINGS: The heart size and mediastinal contours are within normal limits. Both lungs are clear. The visualized skeletal structures are unremarkable. IMPRESSION: No active disease. Electronically Signed   By: Dorise Bullion III M.D.   On: 03/22/2021 16:46   CT Head Wo Contrast  Result Date: 03/22/2021 CLINICAL DATA:  Head trauma EXAM: CT HEAD WITHOUT CONTRAST TECHNIQUE: Contiguous axial images were obtained from the base of the skull through the vertex without intravenous contrast. COMPARISON:  CT head 06/06/2007 FINDINGS: Brain: No acute intracranial hemorrhage, mass effect, or herniation. No extra-axial fluid collections. No evidence of acute territorial infarct. No hydrocephalus. Mild cortical volume loss. Extensive hypodensities throughout the periventricular and subcortical white matter, likely secondary to chronic microvascular ischemic changes. Vascular: Calcified plaques in the carotid siphons. Skull: Normal. Negative for fracture or focal lesion.  Sinuses/Orbits: Small air-fluid levels in the bilateral maxillary sinuses and mild mucosal thickening throughout the paranasal sinuses. Other: None. IMPRESSION: 1. No acute intracranial process identified. 2. Advanced chronic microvascular ischemic changes. 3. Small air-fluid levels in the maxillary sinuses, correlate for evidence of acute sinusitis. Electronically Signed   By: Ofilia Neas M.D.   On: 03/22/2021 15:25   CT Cervical Spine Wo Contrast  Result Date: 03/22/2021 CLINICAL DATA:  Neck trauma EXAM: CT CERVICAL SPINE WITHOUT CONTRAST TECHNIQUE: Multidetector CT imaging of the cervical spine was performed without intravenous contrast. Multiplanar CT image reconstructions were also generated. COMPARISON:  None. FINDINGS: Alignment: Grade 1 anterolisthesis of C4 on C5. Skull base and vertebrae: No acute fracture. No primary bone lesion or focal pathologic process. Soft tissues and spinal canal: No prevertebral fluid or swelling. No visible canal hematoma. Disc levels: Severe intervertebral disc space narrowing at C5-C6 with endplate sclerosis, uncovertebral spurring and prominent dorsal endplate osteophytes. Moderate to severe disc space narrowing at C6-C7 and C7-T1 with small dorsal endplate osteophytes. Bilateral facet arthropathy. Multilevel neural foraminal narrowing most significant at C3-C4 and C5-C6. Multilevel spinal canal stenoses most significant at C5-C6. Upper chest: No acute process visualized. Other: Calcified plaques in the carotid arteries. IMPRESSION: Multilevel degenerative changes of the cervical spine with no acute fracture or subluxation identified. Electronically Signed   By: Ofilia Neas M.D.   On: 03/22/2021 15:29    Scheduled Meds:  apixaban  5 mg Oral BID   donepezil  10 mg Oral QHS   feeding supplement  237 mL Oral BID BM   latanoprost  1 drop Both Eyes QHS   lisinopril  20 mg Oral Daily   memantine  10 mg Oral BID   nirmatrelvir/ritonavir EUA (renal dosing)  2  tablet Oral BID   pantoprazole  80 mg Oral Daily   predniSONE  50 mg Oral Q breakfast   pyridostigmine  60 mg Oral BID   Continuous Infusions:  cefTRIAXone (ROCEPHIN)  IV Stopped (03/23/21 2251)  LOS: 1 day   Time spent: 40 minutes. More than 50% of the time was spent in counseling/coordination of care  Lorella Nimrod, MD [facility]  If 7PM-7AM, please contact night-coverage [email or web address]  03/24/2021, 1:53 PM   This record has been created using Systems analyst. Errors have been sought and corrected,but may not always be located. Such creation errors do not reflect on the standard of care.

## 2021-03-24 NOTE — ED Notes (Signed)
Pt's urinal emptied at this time. 50 mL output.

## 2021-03-24 NOTE — Progress Notes (Addendum)
Progress Note  Patient Name: Elijah Mulvey Sr. Date of Encounter: 03/24/2021  Primary Cardiologist: C. Quentin Ore, MD  Subjective   Feels well.  No chest pain, dyspnea, palpitations.  No significant events on tele.  Inpatient Medications    Scheduled Meds:  apixaban  5 mg Oral BID   donepezil  10 mg Oral QHS   feeding supplement  237 mL Oral BID BM   latanoprost  1 drop Both Eyes QHS   lisinopril  20 mg Oral Daily   memantine  10 mg Oral BID   nirmatrelvir/ritonavir EUA (renal dosing)  2 tablet Oral BID   pantoprazole  80 mg Oral Daily   predniSONE  50 mg Oral Q breakfast   pyridostigmine  60 mg Oral BID   Continuous Infusions:  cefTRIAXone (ROCEPHIN)  IV Stopped (03/23/21 2251)   PRN Meds: acetaminophen **OR** acetaminophen, ondansetron **OR** ondansetron (ZOFRAN) IV, senna-docusate, zolpidem   Vital Signs    Vitals:   03/24/21 0645 03/24/21 0700 03/24/21 0959 03/24/21 1000  BP: 119/69 120/74 99/74 118/73  Pulse: 74 76  88  Resp: 20 19  18   Temp:      TempSrc:      SpO2: 94% 95%  92%  Weight:      Height:       No intake or output data in the 24 hours ending 03/24/21 1223 Filed Weights   03/22/21 1419  Weight: 89 kg    Physical Exam   GEN: Well nourished, well developed, in no acute distress.  HEENT: Grossly normal.  Neck: Supple, no JVD, carotid bruits, or masses. Cardiac: RRR, no murmurs, rubs, or gallops. No clubbing, cyanosis, edema.  Radials 2+, DP/PT 2+ and equal bilaterally.  Respiratory:  Respirations regular and unlabored, clear to auscultation bilaterally. GI: Obese, soft, nontender, nondistended, BS + x 4. MS: no deformity or atrophy. Skin: warm and dry, no rash. Neuro:  Strength and sensation are intact. Psych: AAOx3.  Normal affect.  Labs    Chemistry Recent Labs  Lab 03/22/21 1432 03/23/21 0437  NA 133* 136  K 4.2 4.1  CL 100 103  CO2 25 26  GLUCOSE 114* 88  BUN 22 25*  CREATININE 1.54* 1.57*  CALCIUM 9.0 8.3*  PROT 7.0  --    ALBUMIN 3.7  --   AST 23  --   ALT 19  --   ALKPHOS 64  --   BILITOT 1.6*  --   GFRNONAA 44* 43*  ANIONGAP 8 7     Hematology Recent Labs  Lab 03/22/21 1432 03/23/21 0437 03/24/21 0658  WBC 10.9* 7.8 7.6  RBC 4.71 4.15* 4.31  HGB 15.2 13.6 13.7  HCT 44.9 39.8 40.1  MCV 95.3 95.9 93.0  MCH 32.3 32.8 31.8  MCHC 33.9 34.2 34.2  RDW 12.9 13.3 13.0  PLT 180 161 170    Cardiac Enzymes  Recent Labs  Lab 03/22/21 2052 03/23/21 0042  TROPONINIHS 497* 422*      BNP Recent Labs  Lab 03/22/21 1432  BNP 100.7*     DDimer  Recent Labs  Lab 03/23/21 0838  DDIMER 1.42*     Radiology    DG Chest 1 View  Result Date: 03/22/2021 CLINICAL DATA:  Weakness. EXAM: CHEST  1 VIEW COMPARISON:  March 31, 2018 FINDINGS: The heart size and mediastinal contours are within normal limits. Both lungs are clear. The visualized skeletal structures are unremarkable. IMPRESSION: No active disease. Electronically Signed   By: Dorise Bullion  III M.D.   On: 03/22/2021 16:46   CT Head Wo Contrast  Result Date: 03/22/2021 CLINICAL DATA:  Head trauma EXAM: CT HEAD WITHOUT CONTRAST TECHNIQUE: Contiguous axial images were obtained from the base of the skull through the vertex without intravenous contrast. COMPARISON:  CT head 06/06/2007 FINDINGS: Brain: No acute intracranial hemorrhage, mass effect, or herniation. No extra-axial fluid collections. No evidence of acute territorial infarct. No hydrocephalus. Mild cortical volume loss. Extensive hypodensities throughout the periventricular and subcortical white matter, likely secondary to chronic microvascular ischemic changes. Vascular: Calcified plaques in the carotid siphons. Skull: Normal. Negative for fracture or focal lesion. Sinuses/Orbits: Small air-fluid levels in the bilateral maxillary sinuses and mild mucosal thickening throughout the paranasal sinuses. Other: None. IMPRESSION: 1. No acute intracranial process identified. 2. Advanced  chronic microvascular ischemic changes. 3. Small air-fluid levels in the maxillary sinuses, correlate for evidence of acute sinusitis. Electronically Signed   By: Ofilia Neas M.D.   On: 03/22/2021 15:25   CT Cervical Spine Wo Contrast  Result Date: 03/22/2021 CLINICAL DATA:  Neck trauma EXAM: CT CERVICAL SPINE WITHOUT CONTRAST TECHNIQUE: Multidetector CT imaging of the cervical spine was performed without intravenous contrast. Multiplanar CT image reconstructions were also generated. COMPARISON:  None. FINDINGS: Alignment: Grade 1 anterolisthesis of C4 on C5. Skull base and vertebrae: No acute fracture. No primary bone lesion or focal pathologic process. Soft tissues and spinal canal: No prevertebral fluid or swelling. No visible canal hematoma. Disc levels: Severe intervertebral disc space narrowing at C5-C6 with endplate sclerosis, uncovertebral spurring and prominent dorsal endplate osteophytes. Moderate to severe disc space narrowing at C6-C7 and C7-T1 with small dorsal endplate osteophytes. Bilateral facet arthropathy. Multilevel neural foraminal narrowing most significant at C3-C4 and C5-C6. Multilevel spinal canal stenoses most significant at C5-C6. Upper chest: No acute process visualized. Other: Calcified plaques in the carotid arteries. IMPRESSION: Multilevel degenerative changes of the cervical spine with no acute fracture or subluxation identified. Electronically Signed   By: Ofilia Neas M.D.   On: 03/22/2021 15:29    Telemetry    Sinus rhythm, PACs, PVCs, occas mobitz I. No prolonged pauses. No recurrent Afib - Personally Reviewed  Cardiac Studies   2D Echocardiogram 03/2018  Left ventricle: The cavity size was normal. Wall thickness was    normal. Systolic function was normal. The estimated ejection    fraction was in the range of 60% to 65%. Wall motion was normal;    there were no regional wall motion abnormalities. Doppler    parameters are consistent with abnormal  left ventricular    relaxation (grade 1 diastolic dysfunction).  - Right ventricle: The cavity size was severely dilated. Wall    thickness was normal.  _____________  Elwyn Reach 08/2020  "A ZIO monitor was ordered which showed nonsustained VT, AV Wenke block, 2 episodes of high degree AV block during the sleep hours." _____________   ETT 08/2020 The resting EKG showed sinus rhythm with first-degree AV block with a ventricular rate of 63 bpm.  The patient was exercised on a Bruce protocol where he achieved 4.6 METS and a peak heart rate of 125 bpm which was 91% of his maximum predicted heart rate.  During the exercise ECG, there were isolated PVCs and intermittent second-degree AV block type I and II.  The procedure was stopped because of leg discomfort.  _____________   Patient Profile     86 y.o. 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 Syncope.  Found to be COVID+.  Assessment & Plan    1.  Presyncope/? Syncope/fall:  Pt says that he came to hospital b/c he was weak.  This AM, he denies experiencing syncope prior to arrival, but has known chronic presyncope.  I personally submitted a carelink express interrogation this afternoon w/ finding of PAF on the morning and afternoon of admission (see #2).  No prolonged pauses.  2.  PAF/Tachybrady syndrome:  LINQ interrogated this AM.  Pt w/ 2 episodes of Afib on the day of admission - 1. Beginning 09:48 x 2h 62m w/ avg rate of 90 and max rate of 158; 2. Beginning 12:40 x 4h 66m w/ avg rate of 91 and max rate of 167.  No recurrent afib overnight.  Admission ECG showed afib in the 90's.  ? Potential role of rapid afib in presyncope/weak spells.  As he also has a h/o Mobitz I & II, we have been avoiding AVN blocking agents.  W/ new finding of PAF, will ask EP to see  this week for consideration of PPM.  He is chronically anticoagulated due to h/o PE.  CHA2DS2VASc = 3.  Echo pending.  3.  COVID-19 infection:  Hemodynamically stable.  CXR w/o acute dzs.  Nl O2 sats.  Paxlovid, steroids per IM.  4.  H/o Mobitz I & II:  Mobitz I prev noted on Zio in May w/ higher grade HB during periods of sleep.  Mobitz I/II noted on ETT in May as well.  S/p LINQ in September by Dr. Quentin Ore.  Occas Mobitz I on tele, but I do not see higher grades of HB.  PAF on LINQ  Tachybrady.  Will ask EP to see this week, but if he's discharged prior to their availability, will arrange outpt f/u.  Avoiding AVN blocking agents.  Low threshold to d/c aricept if he develops significant bradycardia.  5.  Elevated HsTroponin/Demand Ischemia:  In the setting of above, HsTrop moderately elevated w/ downtrend  497  422.  He denies chest pain this AM or prior to admission.  Suspect demand ischemia - possibly in the setting of rapid afib prior to arrival (though avg rates were only in the 90's).  Echo pending.  Will need ischemic eval @ some point, but can likely be deferred to the outpt setting following completion of treatment for COVID.  6.  Essential HTN:  stable on lisinopril.  7.  H/o PE:  on chronic eliquis.  Signed, Murray Hodgkins, NP  03/24/2021, 12:23 PM    For questions or updates, please contact   Please consult [email or web address] for contact info under Cardiology/STEMI.

## 2021-03-25 ENCOUNTER — Inpatient Hospital Stay (HOSPITAL_COMMUNITY)
Admit: 2021-03-25 | Discharge: 2021-03-25 | Disposition: A | Discharge status: Home / Self Care | Payer: No Typology Code available for payment source | Attending: Nurse Practitioner | Admitting: Nurse Practitioner

## 2021-03-25 DIAGNOSIS — R55 Syncope and collapse: Secondary | ICD-10-CM

## 2021-03-25 LAB — ECHOCARDIOGRAM COMPLETE
AR max vel: 2.73 cm2
AV Area VTI: 2.37 cm2
AV Area mean vel: 2.61 cm2
AV Mean grad: 2 mmHg
AV Peak grad: 3.4 mmHg
Ao pk vel: 0.92 m/s
Area-P 1/2: 3.17 cm2
Height: 69 in
MV VTI: 1.68 cm2
S' Lateral: 3.58 cm
Weight: 3139.35 oz

## 2021-03-25 MED ORDER — AMOXICILLIN-POT CLAVULANATE 875-125 MG PO TABS
1.0000 | ORAL_TABLET | Freq: Two times a day (BID) | ORAL | Status: DC
  Administered 2021-03-25 – 2021-03-28 (×7): 1 via ORAL
  Filled 2021-03-25 (×7): qty 1

## 2021-03-25 MED ORDER — ENSURE ENLIVE PO LIQD
237.0000 mL | Freq: Three times a day (TID) | ORAL | Status: DC
  Administered 2021-03-26 (×2): 237 mL via ORAL

## 2021-03-25 MED ORDER — ADULT MULTIVITAMIN W/MINERALS CH
1.0000 | ORAL_TABLET | Freq: Every day | ORAL | Status: DC
  Administered 2021-03-26 – 2021-03-28 (×3): 1 via ORAL
  Filled 2021-03-25 (×3): qty 1

## 2021-03-25 MED ORDER — ENOXAPARIN SODIUM 100 MG/ML IJ SOSY
1.0000 mg/kg | PREFILLED_SYRINGE | Freq: Two times a day (BID) | INTRAMUSCULAR | Status: AC
  Administered 2021-03-25 – 2021-03-27 (×5): 90 mg via SUBCUTANEOUS
  Filled 2021-03-25 (×5): qty 0.9

## 2021-03-25 MED ORDER — DOXYCYCLINE HYCLATE 100 MG PO TABS
100.0000 mg | ORAL_TABLET | Freq: Two times a day (BID) | ORAL | Status: DC
  Administered 2021-03-25 – 2021-03-28 (×7): 100 mg via ORAL
  Filled 2021-03-25 (×7): qty 1

## 2021-03-25 NOTE — Progress Notes (Addendum)
PROGRESS NOTE    Elijah Petteway Sr.  EQA:[id number] DOB: 1934/07/01 DOA: 03/22/2021 PCP: Olin Hauser, DO   Brief Narrative: Taken from H&P. Elijah Ponder Sr. is a 86 y.o. male with medical history significant for myasthenia gravis, dementia, hyperlipidemia, hypertension, GERD, OSA, glaucoma, insomnia, who presents to the emergency department for chief concerns of syncopal event. Patient unable to participate with review of system or giving any meaningful history due to underlying dementia. He was found to have temperature of 102.1, otherwise stable vitals. Tested positive for COVID-19 UA was also concerning with some hematuria and leukocytosis-urine cultures pending.  Preliminary blood cultures negative. PT is recommending SNF placement. 1/4, it will be a black culture with gram-positive cocci, staph species, most likely a contaminant-we will repeat blood culture.  12/19: Repeat blood cultures remain negative.  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  History of Morbitz type I and II, also seen to have A. fib on this interrogation.  Cardiology is recommending EP evaluation for permanent pacemaker placement, if they cannot get to him before discharge they will arrange outpatient follow-up. PT is recommending SNF  12/20: No new event.  Medically stable for rehab.  Echocardiogram without any significant abnormality.  EP evaluation still pending.  Subjective: Patient was seen and examined today.  No new complaints.  Feeling that he is improving slowly and able to go to the restroom by himself.  Continues to have cough.  Wife at bedside but still very worried about mobility and wants him to go to rehab before returning home.  Assessment & Plan:   Principal Problem:   Syncope and collapse Active Problems:   Dizziness and giddiness   Essential hypertension   HLD (hyperlipidemia)   GERD (gastroesophageal reflux disease)   OSA (obstructive sleep  apnea)  Syncope.  History of prior syncopal episodes.  Most likely multifactorial with recent COVID-19 infection versus UTI, urine cultures negative. Patient had loop recorder in place-and interrogation did show 2 episodes of A. fib.  Has a longstanding history of morbidz type I and II.  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  Cardiology is recommending EP evaluation, which can be done as an outpatient if he is ready to discharge before they can see him.  He might need permanent pacemaker. -PT is recommending SNF -TOC consult  COVID-19 infection.  Elevated inflammatory markers.  He was started on remdesivir which was later transitioned to [place]. Remained on room air. Repeat chest x-ray due to worsening cough with some concern of right upper lobe infiltrate.  Procalcitonin elevated at 1.84. -Start him on Augmentin and doxycycline-Levaquin and Zithromax can interfere with his myasthenia symptoms. -Continue with Paxlovid for 5 days -Keep holding statin for 5 days while he remains on antiviral. -Added prednisone as that will also help with his worsening fatigue which can be related to myasthenia gravis exacerbation. -Continue with supportive care  Concern of UTI.  Unable to explain any symptoms.  UA with some hematuria and leukocytosis.  He was started on ceftriaxone Urine cultures negative, so ceftriaxone was discontinued.  Concern of urinary retention.  Foley was placed in triage with concern of urinary retention.  No proper documentation.  Per wife he did not have any urinary problems, except he cannot stand up for urination.  Patient is able to void after removal of catheter.  History of CAD/elevated troponin.  No chest pain.  Mildly positive troponin with a flat curve.  Most likely secondary to demand ischemia. Patient was initially started  on heparin infusion for concern of NSTEMI.  Cardiology was also consulted.  Heparin infusion was discontinued next morning -Home Eliquis is switched with  Lovenox because of Paxlovid  History of paroxysmal atrial fibrillation.  Rate little variable. -Currently on Lovenox  History of myasthenia gravis. -Continue home dose of pyridostigmine  Hypertension.  Blood pressure within goal. -Continue home dose of lisinopril  History of dementia. -Continue home dose of donepezil and memantine.  History of insomnia. -Continue home dose of zolpidem as needed.  Objective: Vitals:   03/24/21 1505 03/24/21 2042 03/25/21 0326 03/25/21 0901  BP: 129/65 133/78 126/75 140/71  Pulse: 73 71 (!) 57 75  Resp: 16 16 16 16   Temp: 97.7 F (36.5 C) 97.7 F (36.5 C) 98.3 F (36.8 C) 97.6 F (36.4 C)  TempSrc: Oral Oral    SpO2: 97% 94% 95% 95%  Weight:      Height:       No intake or output data in the 24 hours ending 03/25/21 1500  Filed Weights   03/22/21 1419  Weight: 89 kg    Examination:  General.  Well-developed gentleman, in no acute distress. Pulmonary.  Lungs clear bilaterally, normal respiratory effort. CV.  Regular rate and rhythm, no JVD, rub or murmur. Abdomen.  Soft, nontender, nondistended, BS positive. CNS.  Alert and oriented .  No focal neurologic deficit. Extremities.  No edema, no cyanosis, pulses intact and symmetrical. Psychiatry.  Appears to have some cognitive impairment  DVT prophylaxis: Lovenox Code Status: Full Family Communication: Discussed with wife at bedside Disposition Plan:  Status is: Inpatient  Remains inpatient appropriate because: Severity of illness   Level of care: Telemetry Medical  All the records are reviewed and case discussed with Care Management/Social Worker. Management plans discussed with the patient, nursing and they are in agreement.  Consultants:  Cardiology  Procedures:  Antimicrobials:  Augmentin Doxycycline  Data Reviewed: I have personally reviewed following labs and imaging studies  CBC: Recent Labs  Lab 03/22/21 1432 03/23/21 0437 03/24/21 0658  WBC 10.9* 7.8  7.6  NEUTROABS 9.0*  --   --   HGB 15.2 13.6 13.7  HCT 44.9 39.8 40.1  MCV 95.3 95.9 93.0  PLT 180 161 347    Basic Metabolic Panel: Recent Labs  Lab 03/22/21 1432 03/23/21 0437  NA 133* 136  K 4.2 4.1  CL 100 103  CO2 25 26  GLUCOSE 114* 88  BUN 22 25*  CREATININE 1.54* 1.57*  CALCIUM 9.0 8.3*    GFR: Estimated Creatinine Clearance: 38.6 mL/min (A) (by C-G formula based on SCr of 1.57 mg/dL (H)). Liver Function Tests: Recent Labs  Lab 03/22/21 1432  AST 23  ALT 19  ALKPHOS 64  BILITOT 1.6*  PROT 7.0  ALBUMIN 3.7    No results for input(s): LIPASE, AMYLASE in the last 168 hours. No results for input(s): AMMONIA in the last 168 hours. Coagulation Profile: Recent Labs  Lab 03/22/21 1432  INR 1.3*    Cardiac Enzymes: Recent Labs  Lab 03/22/21 2052 03/23/21 0838 03/24/21 0659  CKTOTAL 537* 605* 269    BNP (last 3 results) No results for input(s): PROBNP in the last 8760 hours. HbA1C: No results for input(s): HGBA1C in the last 72 hours. CBG: No results for input(s): GLUCAP in the last 168 hours. Lipid Profile: No results for input(s): CHOL, HDL, LDLCALC, TRIG, CHOLHDL, LDLDIRECT in the last 72 hours. Thyroid Function Tests: No results for input(s): TSH, T4TOTAL, FREET4, T3FREE, THYROIDAB in  the last 72 hours. Anemia Panel: Recent Labs    03/23/21 0838  FERRITIN 260    Sepsis Labs: Recent Labs  Lab 03/22/21 2052 03/22/21 2243 03/24/21 0659  PROCALCITON  --   --  1.84  LATICACIDVEN 1.4 1.5  --      Recent Results (from the past 240 hour(s))  Resp Panel by RT-PCR (Flu A&B, Covid) Nasopharyngeal Swab     Status: Abnormal   Collection Time: 03/22/21  8:52 PM   Specimen: Nasopharyngeal Swab; Nasopharyngeal(NP) swabs in vial transport medium  Result Value Ref Range Status   SARS Coronavirus 2 by RT PCR POSITIVE (A) NEGATIVE Final    Comment: (NOTE) SARS-CoV-2 target nucleic acids are DETECTED.  The SARS-CoV-2 RNA is generally detectable  in upper respiratory specimens during the acute phase of infection. Positive results are indicative of the presence of the identified virus, but do not rule out bacterial infection or co-infection with other pathogens not detected by the test. Clinical correlation with patient history and other diagnostic information is necessary to determine patient infection status. The expected result is Negative.  Fact Sheet for Patients: EntrepreneurPulse.com.au  Fact Sheet for Healthcare Providers: IncredibleEmployment.be  This test is not yet approved or cleared by the [place] FDA and  has been authorized for detection and/or diagnosis of SARS-CoV-2 by FDA under an Emergency Use Authorization (EUA).  This EUA will remain in effect (meaning this test can be used) for the duration of  the COVID-19 declaration under Section 564(b)(1) of the A ct, 21 U.S.C. section 360bbb-3(b)(1), unless the authorization is terminated or revoked sooner.     Influenza A by PCR NEGATIVE NEGATIVE Final   Influenza B by PCR NEGATIVE NEGATIVE Final    Comment: (NOTE) The Xpert Xpress SARS-CoV-2/FLU/RSV plus assay is intended as an aid in the diagnosis of influenza from Nasopharyngeal swab specimens and should not be used as a sole basis for treatment. Nasal washings and aspirates are unacceptable for Xpert Xpress SARS-CoV-2/FLU/RSV testing.  Fact Sheet for Patients: EntrepreneurPulse.com.au  Fact Sheet for Healthcare Providers: IncredibleEmployment.be  This test is not yet approved or cleared by the [place] FDA and has been authorized for detection and/or diagnosis of SARS-CoV-2 by FDA under an Emergency Use Authorization (EUA). This EUA will remain in effect (meaning this test can be used) for the duration of the COVID-19 declaration under Section 564(b)(1) of the Act, 21 U.S.C. section 360bbb-3(b)(1), unless the authorization  is terminated or revoked.  Performed at [facility], [address] [place], [place] [address]   Blood Culture (routine x 2)     Status: Abnormal (Preliminary result)   Collection Time: 03/22/21  8:52 PM   Specimen: BLOOD  Result Value Ref Range Status   Specimen Description   Final    BLOOD LEFT ANTECUBITAL Performed at [facility], [address], [place], [place] [address]    Special Requests   Final    BOTTLES DRAWN AEROBIC AND ANAEROBIC Blood Culture adequate volume Performed at [facility], [address] [place], [place] [address]    Culture  Setup Time   Final    GRAM POSITIVE COCCI ANAEROBIC BOTTLE ONLY Performed at [facility], [address] [place], [place] [address]    Culture STAPHYLOCOCCUS CAPITIS (A)  Final   Report Status PENDING  Incomplete  Urine Culture     Status: None   Collection Time: 03/22/21  8:52 PM   Specimen: In/Out Cath Urine  Result Value  Ref Range Status   Specimen Description   Final    IN/OUT CATH URINE Performed at [facility], [address], [place], [place] [address]    Special Requests   Final    NONE Performed at [facility], [address] [place], [place] [address]    Culture   Final    NO GROWTH Performed at [facility], [place] [address] [place], [place] [address]    Report Status 03/24/2021 FINAL  Final  Blood Culture (routine x 2)     Status: Abnormal (Preliminary result)   Collection Time: 03/22/21  9:00 PM   Specimen: BLOOD  Result Value Ref Range Status   Specimen Description   Final    BLOOD RIGHT ANTECUBITAL Performed at [facility], [address], [place], [place] [address]    Special Requests   Final    BOTTLES DRAWN AEROBIC AND ANAEROBIC Blood Culture adequate volume Performed at [facility], [address] [place], [place] [address]    Culture  Setup Time   Final    Organism ID to follow GRAM POSITIVE  COCCI IN BOTH AEROBIC AND ANAEROBIC BOTTLES CRITICAL RESULT CALLED TO, READ BACK BY AND VERIFIED WITH: SAMANTHA RAUER AT 9798 03/23/21.PMF Performed at [facility], [address] [place], [place] [address]    Culture (A)  Final    STAPHYLOCOCCUS CAPITIS SUSCEPTIBILITIES TO FOLLOW STAPHYLOCOCCUS AURICULARIS THE SIGNIFICANCE OF ISOLATING THIS ORGANISM FROM A SINGLE SET OF BLOOD CULTURES WHEN MULTIPLE SETS ARE DRAWN IS UNCERTAIN. PLEASE NOTIFY THE MICROBIOLOGY DEPARTMENT WITHIN ONE WEEK IF SPECIATION AND SENSITIVITIES ARE REQUIRED. Performed at [facility], [place] [address] [place],  [address]    Report Status PENDING  Incomplete  Blood Culture ID Panel (Reflexed)     Status: Abnormal   Collection Time: 03/22/21  9:00 PM  Result Value Ref Range Status   Enterococcus faecalis NOT DETECTED NOT DETECTED Final   Enterococcus Faecium NOT DETECTED NOT DETECTED Final   Listeria monocytogenes NOT DETECTED NOT DETECTED Final   Staphylococcus species DETECTED (A) NOT DETECTED Final    Comment: CRITICAL RESULT CALLED TO, READ BACK BY AND VERIFIED WITH: SAMANTHA RAUER AT 1603 03/23/21.PMF    Staphylococcus aureus (BCID) NOT DETECTED NOT DETECTED Final   Staphylococcus epidermidis NOT DETECTED NOT DETECTED Final   Staphylococcus lugdunensis NOT DETECTED NOT DETECTED Final   Streptococcus species NOT DETECTED NOT DETECTED Final   Streptococcus agalactiae NOT DETECTED NOT DETECTED Final   Streptococcus pneumoniae NOT DETECTED NOT DETECTED Final   Streptococcus pyogenes NOT DETECTED NOT DETECTED Final   A.calcoaceticus-baumannii NOT DETECTED NOT DETECTED Final   Bacteroides fragilis NOT DETECTED NOT DETECTED Final   Enterobacterales NOT DETECTED NOT DETECTED Final   Enterobacter cloacae complex NOT DETECTED NOT DETECTED Final   Escherichia coli NOT DETECTED NOT DETECTED Final   Klebsiella aerogenes NOT DETECTED NOT DETECTED Final   Klebsiella oxytoca NOT DETECTED NOT  DETECTED Final   Klebsiella pneumoniae NOT DETECTED NOT DETECTED Final   Proteus species NOT DETECTED NOT DETECTED Final   Salmonella species NOT DETECTED NOT DETECTED Final   Serratia marcescens NOT DETECTED NOT DETECTED Final   Haemophilus influenzae NOT DETECTED NOT DETECTED Final   Neisseria meningitidis NOT DETECTED NOT DETECTED Final   Pseudomonas aeruginosa NOT DETECTED NOT DETECTED Final   Stenotrophomonas maltophilia NOT DETECTED NOT DETECTED Final   Candida albicans NOT DETECTED NOT DETECTED Final   Candida auris NOT DETECTED NOT DETECTED Final  Candida glabrata NOT DETECTED NOT DETECTED Final   Candida krusei NOT DETECTED NOT DETECTED Final   Candida parapsilosis NOT DETECTED NOT DETECTED Final   Candida tropicalis NOT DETECTED NOT DETECTED Final   Cryptococcus neoformans/gattii NOT DETECTED NOT DETECTED Final    Comment: Performed at [facility], [address] [place], [place] [address]  CULTURE, BLOOD (ROUTINE X 2) w Reflex to ID Panel     Status: None (Preliminary result)   Collection Time: 03/23/21  4:33 PM   Specimen: BLOOD  Result Value Ref Range Status   Specimen Description BLOOD BLOOD RIGHT ARM  Final   Special Requests   Final    BOTTLES DRAWN AEROBIC AND ANAEROBIC Blood Culture adequate volume   Culture   Final    NO GROWTH 2 DAYS Performed at [facility], [address], [place], [place] [address]    Report Status PENDING  Incomplete  CULTURE, BLOOD (ROUTINE X 2) w Reflex to ID Panel     Status: None (Preliminary result)   Collection Time: 03/23/21  4:33 PM   Specimen: BLOOD  Result Value Ref Range Status   Specimen Description BLOOD RIGHT ANTECUBITAL  Final   Special Requests   Final    BOTTLES DRAWN AEROBIC AND ANAEROBIC Blood Culture adequate volume   Culture   Final    NO GROWTH 2 DAYS Performed at [facility], [address], [place], [place] [address]    Report Status PENDING  Incomplete      Radiology  Studies: DG Chest 2 View  Result Date: 03/24/2021 CLINICAL DATA:  Per MD note, COVID 19 virus on 12/17 EXAM: CHEST - 2 VIEW COMPARISON:  03/22/2021 FINDINGS: Prominent interstitial markings in the lung bases as before. Slight increase in interstitial opacities in the right upper lung. No confluent airspace disease. Heart size and mediastinal contours are within normal limits. Subcutaneous monitoring device overlies the left chest. No effusion.  No pneumothorax. Visualized bones unremarkable. IMPRESSION: Subtle right upper lobe interstitial infiltrate more conspicuous than on prior study. Electronically Signed   By: Lucrezia Europe M.D.   On: 03/24/2021 16:17   ECHOCARDIOGRAM COMPLETE  Result Date: 03/25/2021    ECHOCARDIOGRAM REPORT   Patient Name:   Sr. Elijah Jackson Sr. Date of Exam: 03/25/2021 Medical Rec #:  [id number]             Height:       69.0 in Accession #:    [id number]            Weight:       196.2 lb Date of Birth:  09-Apr-1934             BSA:          2.049 m Patient Age:    46 years              BP:           126/75 mmHg Patient Gender: M                     HR:           57 bpm. Exam Location:  [facility] Procedure: 2D Echo, Color Doppler and Cardiac Doppler Indications:     R55 Syncope  History:         Patient has prior history of Echocardiogram examinations. Risk                  Factors:Hypertension and Dyslipidemia.  Pt tested positive for                  COVID-19 on 03/22/21.  Sonographer:     Charmayne Sheer Referring Phys:  [id number]oll BERGE Diagnosing Phys: Kathlyn Sacramento MD  Sonographer Comments: Suboptimal apical window and suboptimal subcostal window. IMPRESSIONS  1. Left ventricular ejection fraction, by estimation, is 55 to 60%. The left ventricle has normal function. The left ventricle has no regional wall motion abnormalities. There is mild left ventricular hypertrophy. Left ventricular diastolic parameters are indeterminate.  2. Right ventricular systolic function is normal.  The right ventricular size is normal. Tricuspid regurgitation signal is inadequate for assessing PA pressure.  3. Left atrial size was mild to moderately dilated.  4. The mitral valve is normal in structure. No evidence of mitral valve regurgitation. No evidence of mitral stenosis.  5. The aortic valve is normal in structure. Aortic valve regurgitation is not visualized. Aortic valve sclerosis/calcification is present, without any evidence of aortic stenosis. FINDINGS  Left Ventricle: Left ventricular ejection fraction, by estimation, is 55 to 60%. The left ventricle has normal function. The left ventricle has no regional wall motion abnormalities. The left ventricular internal cavity size was normal in size. There is  mild left ventricular hypertrophy. Left ventricular diastolic parameters are indeterminate. Right Ventricle: The right ventricular size is normal. No increase in right ventricular wall thickness. Right ventricular systolic function is normal. Tricuspid regurgitation signal is inadequate for assessing PA pressure. Left Atrium: Left atrial size was mild to moderately dilated. Right Atrium: Right atrial size was normal in size. Pericardium: There is no evidence of pericardial effusion. Mitral Valve: The mitral valve is normal in structure. No evidence of mitral valve regurgitation. No evidence of mitral valve stenosis. MV peak gradient, 6.2 mmHg. The mean mitral valve gradient is 2.0 mmHg. Tricuspid Valve: The tricuspid valve is normal in structure. Tricuspid valve regurgitation is not demonstrated. No evidence of tricuspid stenosis. Aortic Valve: The aortic valve is normal in structure. Aortic valve regurgitation is not visualized. Aortic valve sclerosis/calcification is present, without any evidence of aortic stenosis. Aortic valve mean gradient measures 2.0 mmHg. Aortic valve peak  gradient measures 3.4 mmHg. Aortic valve area, by VTI measures 2.37 cm. Pulmonic Valve: The pulmonic valve was normal in  structure. Pulmonic valve regurgitation is not visualized. No evidence of pulmonic stenosis. Aorta: The aortic root is normal in size and structure. Venous: The inferior vena cava was not well visualized. IAS/Shunts: No atrial level shunt detected by color flow Doppler.  LEFT VENTRICLE PLAX 2D LVIDd:         4.48 cm   Diastology LVIDs:         3.58 cm   LV e' medial:   9.46 cm/s LV PW:         1.27 cm   LV E/e' medial: 10.0 LV IVS:        0.98 cm LVOT diam:     2.20 cm LV SV:         39 LV SV Index:   19 LVOT Area:     3.80 cm  LEFT ATRIUM           Index LA diam:      3.30 cm 1.61 cm/m LA Vol (A2C): 63.7 ml 31.08 ml/m LA Vol (A4C): 87.6 ml 42.75 ml/m  AORTIC VALVE                    PULMONIC VALVE  AV Area (Vmax):    2.73 cm     PV Vmax:       0.92 m/s AV Area (Vmean):   2.61 cm     PV Vmean:      61.700 cm/s AV Area (VTI):     2.37 cm     PV VTI:        0.151 m AV Vmax:           91.80 cm/s   PV Peak grad:  3.4 mmHg AV Vmean:          63.300 cm/s  PV Mean grad:  2.0 mmHg AV VTI:            0.165 m AV Peak Grad:      3.4 mmHg AV Mean Grad:      2.0 mmHg LVOT Vmax:         65.90 cm/s LVOT Vmean:        43.400 cm/s LVOT VTI:          0.103 m LVOT/AV VTI ratio: 0.62  AORTA Ao Root diam: 3.60 cm MITRAL VALVE MV Area (PHT): 3.17 cm    SHUNTS MV Area VTI:   1.68 cm    Systemic VTI:  0.10 m MV Peak grad:  6.2 mmHg    Systemic Diam: 2.20 cm MV Mean grad:  2.0 mmHg MV Vmax:       1.24 m/s MV Vmean:      65.5 cm/s MV Decel Time: 240 msec MV E velocity: 94.40 cm/s Kathlyn Sacramento MD Electronically signed by Kathlyn Sacramento MD Signature Date/Time: 03/25/2021/11:43:37 AM    Final     Scheduled Meds:  amoxicillin-clavulanate  1 tablet Oral Q12H   apixaban  5 mg Oral BID   donepezil  10 mg Oral QHS   doxycycline  100 mg Oral Q12H   feeding supplement  237 mL Oral TID BM   latanoprost  1 drop Both Eyes QHS   lisinopril  20 mg Oral Daily   memantine  10 mg Oral BID   [START ON 03/26/2021] multivitamin with minerals   1 tablet Oral Daily   nirmatrelvir/ritonavir EUA (renal dosing)  2 tablet Oral BID   pantoprazole  80 mg Oral Daily   predniSONE  50 mg Oral Q breakfast   pyridostigmine  60 mg Oral BID   Continuous Infusions:     LOS: 2 days   Time spent: 40 minutes. More than 50% of the time was spent in counseling/coordination of care  Lorella Nimrod, MD [facility]  If 7PM-7AM, please contact night-coverage [email or web address]  03/25/2021, 3:00 PM   This record has been created using Systems analyst. Errors have been sought and corrected,but may not always be located. Such creation errors do not reflect on the standard of care.

## 2021-03-25 NOTE — Progress Notes (Addendum)
Progress Note  Patient Name: Elijah Going Sr. Date of Encounter: 03/25/2021  Primary Cardiologist: Vickie Epley, MD  Subjective   Feels well.  No chest pain, sob, palpitations.  Inpatient Medications    Scheduled Meds:  amoxicillin-clavulanate  1 tablet Oral Q12H   donepezil  10 mg Oral QHS   doxycycline  100 mg Oral Q12H   enoxaparin (LOVENOX) injection  1 mg/kg Subcutaneous Q12H   feeding supplement  237 mL Oral TID BM   latanoprost  1 drop Both Eyes QHS   lisinopril  20 mg Oral Daily   memantine  10 mg Oral BID   [START ON 03/26/2021] multivitamin with minerals  1 tablet Oral Daily   nirmatrelvir/ritonavir EUA (renal dosing)  2 tablet Oral BID   pantoprazole  80 mg Oral Daily   predniSONE  50 mg Oral Q breakfast   pyridostigmine  60 mg Oral BID   Continuous Infusions:  PRN Meds: acetaminophen **OR** acetaminophen, ondansetron **OR** ondansetron (ZOFRAN) IV, senna-docusate, zolpidem   Vital Signs    Vitals:   03/24/21 1505 03/24/21 2042 03/25/21 0326 03/25/21 0901  BP: 129/65 133/78 126/75 140/71  Pulse: 73 71 (!) 57 75  Resp: 16 16 16 16   Temp: 97.7 F (36.5 C) 97.7 F (36.5 C) 98.3 F (36.8 C) 97.6 F (36.4 C)  TempSrc: Oral Oral    SpO2: 97% 94% 95% 95%  Weight:      Height:       No intake or output data in the 24 hours ending 03/25/21 1556 Filed Weights   03/22/21 1419  Weight: 89 kg    Physical Exam   GEN: Well nourished, well developed, in no acute distress.  HEENT: Grossly normal.  Neck: Supple, no JVD, carotid bruits, or masses. Cardiac: RRR, no murmurs, rubs, or gallops. No clubbing, cyanosis, edema.  Radials 2+, DP/PT 2+ and equal bilaterally.  Respiratory:  Respirations regular and unlabored, clear to auscultation bilaterally. GI: Obese, soft, nontender, nondistended, BS + x 4. MS: no deformity or atrophy. Skin: warm and dry, no rash. Neuro:  Strength and sensation are intact. Psych: AAOx3.  Normal affect.  Labs     Chemistry Recent Labs  Lab 03/22/21 1432 03/23/21 0437  NA 133* 136  K 4.2 4.1  CL 100 103  CO2 25 26  GLUCOSE 114* 88  BUN 22 25*  CREATININE 1.54* 1.57*  CALCIUM 9.0 8.3*  PROT 7.0  --   ALBUMIN 3.7  --   AST 23  --   ALT 19  --   ALKPHOS 64  --   BILITOT 1.6*  --   GFRNONAA 44* 43*  ANIONGAP 8 7     Hematology Recent Labs  Lab 03/22/21 1432 03/23/21 0437 03/24/21 0658  WBC 10.9* 7.8 7.6  RBC 4.71 4.15* 4.31  HGB 15.2 13.6 13.7  HCT 44.9 39.8 40.1  MCV 95.3 95.9 93.0  MCH 32.3 32.8 31.8  MCHC 33.9 34.2 34.2  RDW 12.9 13.3 13.0  PLT 180 161 170    Cardiac Enzymes  Recent Labs  Lab 03/22/21 2052 03/23/21 0042  TROPONINIHS 497* 422*      BNP Recent Labs  Lab 03/22/21 1432  BNP 100.7*     DDimer  Recent Labs  Lab 03/23/21 0838  DDIMER 1.42*    Radiology    DG Chest 1 View  Result Date: 03/22/2021 CLINICAL DATA:  Weakness. EXAM: CHEST  1 VIEW COMPARISON:  March 31, 2018 FINDINGS: The heart size  and mediastinal contours are within normal limits. Both lungs are clear. The visualized skeletal structures are unremarkable. IMPRESSION: No active disease. Electronically Signed   By: Dorise Bullion III M.D.   On: 03/22/2021 16:46   DG Chest 2 View  Result Date: 03/24/2021 CLINICAL DATA:  Per MD note, COVID 19 virus on 12/17 EXAM: CHEST - 2 VIEW COMPARISON:  03/22/2021 FINDINGS: Prominent interstitial markings in the lung bases as before. Slight increase in interstitial opacities in the right upper lung. No confluent airspace disease. Heart size and mediastinal contours are within normal limits. Subcutaneous monitoring device overlies the left chest. No effusion.  No pneumothorax. Visualized bones unremarkable. IMPRESSION: Subtle right upper lobe interstitial infiltrate more conspicuous than on prior study. Electronically Signed   By: Lucrezia Europe M.D.   On: 03/24/2021 16:17   CT Head Wo Contrast  Result Date: 03/22/2021 CLINICAL DATA:  Head trauma  EXAM: CT HEAD WITHOUT CONTRAST TECHNIQUE: Contiguous axial images were obtained from the base of the skull through the vertex without intravenous contrast. COMPARISON:  CT head 06/06/2007 FINDINGS: Brain: No acute intracranial hemorrhage, mass effect, or herniation. No extra-axial fluid collections. No evidence of acute territorial infarct. No hydrocephalus. Mild cortical volume loss. Extensive hypodensities throughout the periventricular and subcortical white matter, likely secondary to chronic microvascular ischemic changes. Vascular: Calcified plaques in the carotid siphons. Skull: Normal. Negative for fracture or focal lesion. Sinuses/Orbits: Small air-fluid levels in the bilateral maxillary sinuses and mild mucosal thickening throughout the paranasal sinuses. Other: None. IMPRESSION: 1. No acute intracranial process identified. 2. Advanced chronic microvascular ischemic changes. 3. Small air-fluid levels in the maxillary sinuses, correlate for evidence of acute sinusitis. Electronically Signed   By: Ofilia Neas M.D.   On: 03/22/2021 15:25   CT Cervical Spine Wo Contrast  Result Date: 03/22/2021 CLINICAL DATA:  Neck trauma EXAM: CT CERVICAL SPINE WITHOUT CONTRAST TECHNIQUE: Multidetector CT imaging of the cervical spine was performed without intravenous contrast. Multiplanar CT image reconstructions were also generated. COMPARISON:  None. FINDINGS: Alignment: Grade 1 anterolisthesis of C4 on C5. Skull base and vertebrae: No acute fracture. No primary bone lesion or focal pathologic process. Soft tissues and spinal canal: No prevertebral fluid or swelling. No visible canal hematoma. Disc levels: Severe intervertebral disc space narrowing at C5-C6 with endplate sclerosis, uncovertebral spurring and prominent dorsal endplate osteophytes. Moderate to severe disc space narrowing at C6-C7 and C7-T1 with small dorsal endplate osteophytes. Bilateral facet arthropathy. Multilevel neural foraminal narrowing  most significant at C3-C4 and C5-C6. Multilevel spinal canal stenoses most significant at C5-C6. Upper chest: No acute process visualized. Other: Calcified plaques in the carotid arteries. IMPRESSION: Multilevel degenerative changes of the cervical spine with no acute fracture or subluxation identified. Electronically Signed   By: Ofilia Neas M.D.   On: 03/22/2021 15:29   Telemetry    Pt is not on tele   Cardiac Studies   Zio 08/2020   "A ZIO monitor was ordered which showed nonsustained VT, AV Wenke block, 2 episodes of high degree AV block during the sleep hours." _____________    ETT 08/2020 The resting EKG showed sinus rhythm with first-degree AV block with a ventricular rate of 63 bpm.  The patient was exercised on a Bruce protocol where he achieved 4.6 METS and a peak heart rate of 125 bpm which was 91% of his maximum predicted heart rate.  During the exercise ECG, there were isolated PVCs and intermittent second-degree AV block type I and II.  The  procedure was stopped because of leg discomfort.  _____________   2D Echocardiogram 12.20.2022  1. Left ventricular ejection fraction, by estimation, is 55 to 60%. The  left ventricle has normal function. The left ventricle has no regional  wall motion abnormalities. There is mild left ventricular hypertrophy.  Left ventricular diastolic parameters  are indeterminate.   2. Right ventricular systolic function is normal. The right ventricular  size is normal. Tricuspid regurgitation signal is inadequate for assessing  PA pressure.   3. Left atrial size was mild to moderately dilated.   4. The mitral valve is normal in structure. No evidence of mitral valve  regurgitation. No evidence of mitral stenosis.   5. The aortic valve is normal in structure. Aortic valve regurgitation is  not visualized. Aortic valve sclerosis/calcification is present, without  any evidence of aortic stenosis.  _____________   Patient Profile     86 y.o.  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 Syncope.  Found to be COVID+.  Assessment & Plan    1.  Presyncope/? Syncope/Fall:   Pt says that he came to hospital b/c he was weak w/ fall @ home.  Unclear h/o syncope.  [facility] express interrogation of LINQ did not show any significant pauses, however, he did have Afib w/ avg rates in the 90's and max rate of 167.  No recurrent presyncope/syncope.  Denies palps.  Overall feels well despite COVID infection.  Not on tele.  Echo w/ nl EF.  PT rec SNF.  See #2.  2.  PAF/Tachybrady syndrome:   LINQ interrogated 12/19.  Pt w/ 2 episodes of Afib on the day of admission - 1. Beginning 09:48 x 2h 71m w/ avg rate of 90 and max rate of 158; 2. Beginning 12:40 x 4h 50m w/ avg rate of 91 and max rate of 167.  No recurrent afib overnight.  Admission ECG showed afib in the 90's.  ? Potential role of rapid afib in presyncope/weak spells.  As he also has a h/o Mobitz I & II, we have been avoiding AVN blocking agents.  W/ new finding of PAF, will ask EP to see this week for consideration of PPM - will have to be as outpt as EP is not available @ [place] this week.  He is chronically anticoagulated due to h/o PE.  CHA2DS2VASc = 3.  If d/c'd prior to seeing EP, we will arrange for outpt f/u.  3.  COVID-19 infection:  Hemodynamically stable.  Asymptomatic.  Paxlovid/steroids per IM.  4.  H/o Mobitz I & II Heart block: Mobitz I prev noted on Zio in May w/ higher grade HB during periods of sleep.  Mobitz I/II noted on ETT in May as well.  S/p LINQ in September by Dr. Quentin Ore. In light of new finding of PAF w/ ? Role in presyncope/syncope, will ask EP to eval as outpt.  5.  Demand Ischemia:  In the setting of above, HsTrop moderately elevated w/ downtrend  497  422.  Echo w/ nl EF w/o rwma. nO  Chest pain prior to admission.  Suspect demand ischemia - possibly in the setting of elevated rates in AFib prior to arrival (though avg rates were only in the 90's).  Ideally will have ischemic eval @ some point following recovery from [place].  No ASA in  setting of chronic [place].  No ? blocker in setting of h/o Mobitz I/II.  Will hold off on statin given paxlovid use and drug-drug interactions.  6.  Essential HTN: stable.  7.  H/o PE:  on chronic eliquis @ home  Held in setting of concomitant paxlovid use  Lovenox.  Signed, Murray Hodgkins, NP  03/25/2021, 3:56 PM    For questions or updates, please contact   Please consult [email or web address] for contact info under Cardiology/STEMI.

## 2021-03-25 NOTE — Progress Notes (Signed)
Initial Nutrition Assessment  DOCUMENTATION CODES:   Not applicable  INTERVENTION:   Ensure Enlive po TID, each supplement provides 350 kcal and 20 grams of protein  MVI po daily   NUTRITION DIAGNOSIS:   Increased nutrient needs related to chronic illness (prostate cancer, COVID 19) as evidenced by estimated needs.  GOAL:   Patient will meet greater than or equal to 90% of their needs  MONITOR:   PO intake, Supplement acceptance, Labs, Weight trends, Skin, I & O's  REASON FOR ASSESSMENT:   Malnutrition Screening Tool    ASSESSMENT:   86 y.o. male with medical history significant for myasthenia gravis, dementia, hyperlipidemia, hypertension, GERD, OSA, glaucoma, insomnia and prostate cancer s/p TURP who presents to the emergency department for chief concerns of syncopal event and COVID 19.  Met with pt in room today. Pt reports good appetite and oral intake pta and in hospital. Pt reports eating 100% of his breakfast and lunch. Pt is asking when dinner will be served. Pt reports that he is drinking the Ensure supplements; pt reports that he likes the strawberry flavor the best. Recommend continue Ensure supplements. RD discussed with pt the importance of adequate nutrition needed to preserve lean muscle. Pt reports chronic loose stools since having his gallbladder out. Per chart, pt is weight stable at baseline.   Medications reviewed and include: doxycycline, protonix, prednisone  Labs reviewed: K 4.1 wnl, BUN 25(H), creat 1.57(H)  NUTRITION - FOCUSED PHYSICAL EXAM:  Flowsheet Row Most Recent Value  Orbital Region No depletion  Upper Arm Region No depletion  Thoracic and Lumbar Region No depletion  Buccal Region No depletion  Temple Region No depletion  Clavicle Bone Region No depletion  Clavicle and Acromion Bone Region No depletion  Scapular Bone Region No depletion  Dorsal Hand Mild depletion  Patellar Region Mild depletion  Anterior Thigh Region No depletion   Posterior Calf Region No depletion  Edema (RD Assessment) None  Hair Reviewed  Eyes Reviewed  Mouth Reviewed  Skin Reviewed  Nails Reviewed   Diet Order:   Diet Order             Diet Heart Room service appropriate? Yes; Fluid consistency: Nectar Thick  Diet effective now                  EDUCATION NEEDS:   Education needs have been addressed  Skin:  Skin Assessment: Reviewed RN Assessment  Last BM:  PTA  Height:   Ht Readings from Last 1 Encounters:  03/22/21 _0  (1.753 m)    Weight:   Wt Readings from Last 1 Encounters:  03/22/21 89 kg    Ideal Body Weight:  72.7 kg  BMI:  Body mass index is 28.98 kg/m.  Estimated Nutritional Needs:   Kcal:  1900-2200kcal/day  Protein:  95-110g/day  Fluid:  1.9-2.2L/day  Koleen Distance MS, RD, LDN Please refer to [facility] for RD and/or RD on-call/weekend/after hours pager

## 2021-03-25 NOTE — Progress Notes (Signed)
Physical Therapy Treatment Patient Details Name: Elijah Jackson Sr. MRN: [id number] DOB: 1934-12-22 Today's Date: 03/25/2021   History of Present Illness Patient is a 86 year old male with medical history significant for myasthenia gravis, dementia, hyperlipidemia, hypertension, GERD, OSA, glaucoma, insomnia, who presents to the emergency department for chief concerns of syncopal event where patient was found on the ground at home.    PT Comments    Patient agreeable to PT. He did have some improvement in activity tolerance this session, however he continues to require physical assistance with mobility. Patient needs moderate trunk assistance to sit upright without use of bed rails. He did walk in the room without rolling walker, however he required up to minimal assistance for steadying and had a loss of balance that required correction to midline. He could return home only if he has assistance with mobility for fall prevention. Otherwise, SNF is still recommended at this time. PT will continue to follow to maximize independence and facilitate return to prior level of function.     Recommendations for follow up therapy are one component of a multi-disciplinary discharge planning process, led by the attending physician.  Recommendations may be updated based on patient status, additional functional criteria and insurance authorization.  Follow Up Recommendations  Skilled nursing-short term rehab (<3 hours/day)     Assistance Recommended at Discharge Frequent or constant Supervision/Assistance  Equipment Recommendations   (to be determined at next level of care)    Recommendations for Other Services       Precautions / Restrictions Precautions Precautions: Fall Restrictions Weight Bearing Restrictions: No     Mobility  Bed Mobility Overal bed mobility: Needs Assistance Bed Mobility: Supine to Sit;Sit to Supine     Supine to sit: Mod assist Sit to supine: Min guard   General  bed mobility comments: trunk assistance required to sit upright. cues for sequencing provided    Transfers Overall transfer level: Needs assistance Equipment used: None Transfers: Sit to/from Stand Sit to Stand: Min assist           General transfer comment: steadying assistance with sit to stand transfer. no dizziness is reported with upright activity    Ambulation/Gait Ambulation/Gait assistance: Min assist Gait Distance (Feet): 25 Feet Assistive device: None Gait Pattern/deviations: Step-through pattern;Trunk flexed Gait velocity: decreased     General Gait Details: occasional steadying assistance provided. verbal cues for safety   Stairs             Wheelchair Mobility    Modified Rankin (Stroke Patients Only)       Balance Overall balance assessment: Needs assistance Sitting-balance support: Feet supported Sitting balance-Leahy Scale: Good     Standing balance support: No upper extremity supported Standing balance-Leahy Scale: Poor Standing balance comment: patient has a loss of balance to the right that required minimal assistance to correct to midline                            Cognition Arousal/Alertness: Awake/alert Behavior During Therapy: WFL for tasks assessed/performed Overall Cognitive Status: History of cognitive impairments - at baseline                                 General Comments: Patient is mildly impulsive with activity. He is confused about recent events and does not think he has COVID.        Exercises  General Comments General comments (skin integrity, edema, etc.): Sp02 in the 90's on room air. pulse in the 70's after activity      Pertinent Vitals/Pain Pain Assessment: No/denies pain    Home Living                          Prior Function            PT Goals (current goals can now be found in the care plan section) Acute Rehab PT Goals Patient Stated Goal: none  stated PT Goal Formulation: With patient Time For Goal Achievement: 04/06/21 Potential to Achieve Goals: Fair Progress towards PT goals: Progressing toward goals    Frequency    Min 2X/week      PT Plan Current plan remains appropriate    Co-evaluation PT/OT/SLP Co-Evaluation/Treatment: Yes Reason for Co-Treatment: Necessary to address cognition/behavior during functional activity PT goals addressed during session: Mobility/safety with mobility OT goals addressed during session: ADL's and self-care      AM-PAC PT "6 Clicks" Mobility   Outcome Measure  Help needed turning from your back to your side while in a flat bed without using bedrails?: A Lot Help needed moving from lying on your back to sitting on the side of a flat bed without using bedrails?: A Lot Help needed moving to and from a bed to a chair (including a wheelchair)?: A Little Help needed standing up from a chair using your arms (e.g., wheelchair or bedside chair)?: A Little Help needed to walk in hospital room?: A Little Help needed climbing 3-5 steps with a railing? : A Lot 6 Click Score: 15    End of Session   Activity Tolerance: Patient limited by fatigue;Patient tolerated treatment well Patient left: in bed;with call bell/phone within reach;with bed alarm set   PT Visit Diagnosis: Unsteadiness on feet (R26.81);Muscle weakness (generalized) (M62.81)     Time: 1303 (co-treated with OT, will split the time for billing)-1328 PT Time Calculation (min) (ACUTE ONLY): 25 min  Charges:  $Therapeutic Activity: 8-22 mins                     Minna Merritts, PT, MPT   Percell Locus 03/25/2021, 2:12 PM

## 2021-03-25 NOTE — Evaluation (Signed)
Occupational Therapy Evaluation Patient Details Name: Elijah Lindahl Sr. MRN: [id number] DOB: 1935/02/09 Today's Date: 03/25/2021   History of Present Illness Patient is a 86 year old male with medical history significant for myasthenia gravis, dementia, hyperlipidemia, hypertension, GERD, OSA, glaucoma, insomnia, who presents to the emergency department for chief concerns of syncopal event where patient was found on the ground at home.   Clinical Impression   Patient presenting with decreased Ind in self care, balance, endurance,safety awareness and strength.Patient is likely poor historian and no family present to confirm baseline.Pt reports living with wife at baseline and being independent in all aspects of care. 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 Pt needing redirection throughout session secondary to safety awareness and impulsivity. Pt needing mod A for bed mobility and min A for mobility without use of AD. He does experience 1 LOB requiring mod A to correct for safety. Pt told he was at hospital secondary to covid and does not believe therapist to be truthful.  Patient will benefit from acute OT to increase overall independence in the areas of ADLs, functional mobility, and safety awareness in order to safely discharge to next venue of care.      Recommendations for follow up therapy are one component of a multi-disciplinary discharge planning process, led by the attending physician.  Recommendations may be updated based on patient status, additional functional criteria and insurance authorization.   Follow Up Recommendations  Skilled nursing-short term rehab (<3 hours/day)    Assistance Recommended at Discharge Frequent or constant Supervision/Assistance  Functional Status Assessment  Patient has had a recent decline in their functional status and demonstrates the ability to make significant improvements in function in a reasonable and predictable amount of time.   Equipment Recommendations  Other (comment) (defer to next venue of care)       Precautions / Restrictions Precautions Precautions: Fall Restrictions Weight Bearing Restrictions: No      Mobility Bed Mobility Overal bed mobility: Needs Assistance Bed Mobility: Supine to Sit;Sit to Supine     Supine to sit: Mod assist Sit to supine: Min guard   General bed mobility comments: trunk assistance required to sit upright. cues for sequencing provided    Transfers Overall transfer level: Needs assistance Equipment used: 1 person hand held assist Transfers: Sit to/from Stand Sit to Stand: Min assist           General transfer comment: steadying assistance with sit to stand transfer. no dizziness is reported with upright activity      Balance Overall balance assessment: Needs assistance Sitting-balance support: Feet supported Sitting balance-Leahy Scale: Good     Standing balance support: No upper extremity supported Standing balance-Leahy Scale: Poor Standing balance comment: patient has a loss of balance to the right that required minimal assistance to correct to midline                           ADL either performed or assessed with clinical judgement   ADL Overall ADL's : Needs assistance/impaired     Grooming: Wash/dry hands;Minimal assistance;Standing;Oral care       Lower Body Bathing: Min guard;Sitting/lateral leans Lower Body Bathing Details (indicate cue type and reason): seated on EOB with figure four position to don B socks                             Vision Patient  Visual Report: No change from baseline              Pertinent Vitals/Pain Pain Assessment: No/denies pain     Hand Dominance Right   Extremity/Trunk Assessment Upper Extremity Assessment Upper Extremity Assessment: Generalized weakness   Lower Extremity Assessment Lower Extremity Assessment: Generalized weakness       Communication  Communication Communication: No difficulties   Cognition Arousal/Alertness: Awake/alert Behavior During Therapy: WFL for tasks assessed/performed Overall Cognitive Status: History of cognitive impairments - at baseline                                 General Comments: Patient is mildly impulsive with activity. He is confused about recent events and does not think he has COVID.     General Comments  Sp02 in the 90's on room air. pulse in the 70's after activity            Home Living Family/patient expects to be discharged to:: Private residence Living Arrangements: Spouse/significant other Available Help at Discharge: Family Type of Home: House       Home Layout: One level     Bathroom Shower/Tub: Tub/shower unit         Home Equipment: Conservation officer, nature (2 wheels);Cane - single point          Prior Functioning/Environment Prior Level of Function : Independent/Modified Independent             Mobility Comments: per patient report he is independent. reports he has fallen in the past ADLs Comments: patient reports he is independent and still working as a Research scientist (medical) Problem List: Decreased strength;Decreased cognition;Decreased activity tolerance;Decreased safety awareness;Impaired balance (sitting and/or standing)      OT Treatment/Interventions: Self-care/ADL training;Therapeutic exercise;Therapeutic activities;Cognitive remediation/compensation;Energy conservation;DME and/or AE instruction;Patient/family education;Balance training;Manual therapy    OT Goals(Current goals can be found in the care plan section) Acute Rehab OT Goals Patient Stated Goal: to go home OT Goal Formulation: With patient Time For Goal Achievement: 04/08/21 Potential to Achieve Goals: Fair  OT Frequency: Min 2X/week   Barriers to D/C: Decreased caregiver support  wife requesting SNF as she is unable to assist pt at home       Co-evaluation    Reason for Co-Treatment: Necessary to address cognition/behavior during functional activity PT goals addressed during session: Mobility/safety with mobility OT goals addressed during session: ADL's and self-care      AM-PAC OT "6 Clicks" Daily Activity     Outcome Measure Help from another person eating meals?: None Help from another person taking care of personal grooming?: A Little Help from another person toileting, which includes using toliet, bedpan, or urinal?: A Little Help from another person bathing (including washing, rinsing, drying)?: A Little Help from another person to put on and taking off regular upper body clothing?: None Help from another person to put on and taking off regular lower body clothing?: A Little 6 Click Score: 20   End of Session Equipment Utilized During Treatment: Rolling walker (2 wheels) Nurse Communication: Mobility status  Activity Tolerance: Patient tolerated treatment well;Patient limited by fatigue Patient left: in bed;with call bell/phone within reach;with bed alarm set  OT Visit Diagnosis: Unsteadiness on feet (R26.81);Repeated falls (R29.6);Muscle weakness (generalized) (M62.81);History of falling (Z91.81)                Time: 1696-7893 OT Time Calculation (  min): 12 min Charges:  OT General Charges $OT Visit: 1 Visit OT Evaluation $OT Eval Moderate Complexity: 1 [address], [place], OTR/L , CBIS ascom [phone]  03/25/21, 3:34 PM

## 2021-03-25 NOTE — Progress Notes (Signed)
*  PRELIMINARY RESULTS* Echocardiogram 2D Echocardiogram has been performed.  Elijah Jackson Lille Karim 03/25/2021, 9:06 AM

## 2021-03-26 ENCOUNTER — Inpatient Hospital Stay: Payer: No Typology Code available for payment source

## 2021-03-26 DIAGNOSIS — I480 Paroxysmal atrial fibrillation: Secondary | ICD-10-CM

## 2021-03-26 DIAGNOSIS — F0390 Unspecified dementia without behavioral disturbance: Secondary | ICD-10-CM

## 2021-03-26 DIAGNOSIS — R29818 Other symptoms and signs involving the nervous system: Secondary | ICD-10-CM

## 2021-03-26 DIAGNOSIS — G7000 Myasthenia gravis without (acute) exacerbation: Secondary | ICD-10-CM

## 2021-03-26 DIAGNOSIS — R531 Weakness: Secondary | ICD-10-CM

## 2021-03-26 LAB — CULTURE, BLOOD (ROUTINE X 2)
Special Requests: ADEQUATE
Special Requests: ADEQUATE

## 2021-03-26 MED ORDER — PREDNISONE 20 MG PO TABS
40.0000 mg | ORAL_TABLET | Freq: Every day | ORAL | Status: AC
  Administered 2021-03-27: 09:00:00 40 mg via ORAL
  Filled 2021-03-26: qty 2

## 2021-03-26 NOTE — TOC Progression Note (Signed)
Transition of Care (TOC) - Progression Note    Patient Details  Name: Elijah Gornick Sr. MRN: [id number] Date of Birth: 1935/01/07  Transition of Care [facility]) CM/SW Contact  Eileen Stanford, LCSW Phone Number: 03/26/2021, 12:59 PM  Clinical Narrative:   Spoke with pt's spouse regarding SNF recommendation. Pt's spouse states she is understanding that the pt can't go anywhere for 10 days SNF wise due to COVID+. Pt's spouse hopes that pt is able to get up and walk steadily and she would prefer pt to dc home with services. MD updated. Will need therapy to continue to work with pt.         Expected Discharge Plan and Services                                                 Social Determinants of Health (SDOH) Interventions    Readmission Risk Interventions No flowsheet data found.

## 2021-03-26 NOTE — Progress Notes (Addendum)
Mobility Specialist - Progress Note   03/26/21 1400  Mobility  Activity Ambulated in hall  Level of Assistance Contact guard assist, steadying assist  Distance Ambulated (ft) 320 ft  Mobility Ambulated with assistance in hallway  Mobility Response Tolerated well  Mobility performed by Mobility specialist  $Mobility charge 1 Mobility    During mobility: 104 HR, 92% SpO2 Post-mobility: 106 HR, 92% SpO2   Pt ambulated in hallway with CGA. No LOB, but mildly unsteady---does occasionally drift to R during ambulation. No AD. Denied SOB on RA. Pt returned to bed with needs in reach.    Kathee Delton Mobility Specialist 03/26/21, 2:04 PM

## 2021-03-26 NOTE — Progress Notes (Addendum)
Patient ID: Elijah Ponder Sr., male   DOB: 07/19/1934, 86 y.o.   MRN: [id number] [facility] PROGRESS NOTE  Even Elijah Sr. VOH:[id number] DOB: 01-22-1935 DOA: 03/22/2021 PCP: Olin Hauser, DO  HPI/Subjective: Patient feels okay.  Admitted with fall and syncope.  Found to have COVID-19 infection.  Feels weak.  Objective: Vitals:   03/26/21 0443 03/26/21 0920  BP: (!) 129/94 (!) 141/71  Pulse: 68 63  Resp: 16 18  Temp: 97.6 F (36.4 C) (!) 97.4 F (36.3 C)  SpO2: 95% 96%   No intake or output data in the 24 hours ending 03/26/21 1621 Filed Weights   03/22/21 1419  Weight: 89 kg    ROS: Review of Systems  Respiratory:  Negative for shortness of breath.   Cardiovascular:  Negative for chest pain.  Gastrointestinal:  Negative for abdominal pain, nausea and vomiting.  Neurological:  Positive for weakness.  Exam: Physical Exam HENT:     Head: Normocephalic.     Mouth/Throat:     Pharynx: No oropharyngeal exudate.  Eyes:     General: Lids are normal.     Conjunctiva/sclera: Conjunctivae normal.  Cardiovascular:     Rate and Rhythm: Normal rate and regular rhythm.     Heart sounds: Normal heart sounds, S1 normal and S2 normal.  Pulmonary:     Breath sounds: No decreased breath sounds, wheezing, rhonchi or rales.  Abdominal:     Palpations: Abdomen is soft.     Tenderness: There is no abdominal tenderness.  Musculoskeletal:     Right lower leg: Swelling present.     Left lower leg: Swelling present.  Skin:    General: Skin is warm.     Findings: No rash.  Neurological:     Mental Status: He is alert.     Comments: Answers questions appropriately.  Able to straight leg raise bilaterally.  Able to get out of the bed and stand up but was a little wobbly with walking with me.  Positive Romberg      Scheduled Meds:  amoxicillin-clavulanate  1 tablet Oral Q12H   donepezil  10 mg Oral QHS   doxycycline  100 mg Oral Q12H   enoxaparin (LOVENOX)  injection  1 mg/kg Subcutaneous Q12H   feeding supplement  237 mL Oral TID BM   latanoprost  1 drop Both Eyes QHS   lisinopril  20 mg Oral Daily   memantine  10 mg Oral BID   multivitamin with minerals  1 tablet Oral Daily   nirmatrelvir/ritonavir EUA (renal dosing)  2 tablet Oral BID   pantoprazole  80 mg Oral Daily   [START ON 03/27/2021] predniSONE  40 mg Oral Q breakfast   pyridostigmine  60 mg Oral BID     Assessment/Plan:  Syncope.  Linq recorder did not show any significant pauses but did have atrial fibrillation.  Will need outpatient EP evaluation as per cardiology. Paroxysmal atrial fibrillation.  Potential tachybradycardia syndrome.  Will need EP as outpatient. COVID-19 pneumonia on Paxlovid, Augmentin and doxycycline. Weakness with positive Romberg..  Patient was able to get up and walk with me but was unsteady and did have a positive Romberg.  Obtain an MRI of the brain.  Check orthostatic vital signs.  Continue working with physical therapy.  Last PT note recommended rehab. Urinary retention resolved after discontinuing Foley catheter Myasthenia gravis on pyridostigmine Essential hypertension on lisinopril Dementia on donezepil and Namenda     Code Status:     Code Status  Orders  (From admission, onward)           Start     Ordered   03/22/21 2244  Full code  Continuous        03/22/21 2244           Code Status History     Date Active Date Inactive Code Status Order ID Comments User Context   03/31/2018 1308 04/01/2018 1548 Full Code [id number]  Epifanio Lesches, MD ED   01/12/2017 2227 01/14/2017 1431 Full Code [id number]  Phillips Grout, MD Inpatient   01/08/2017 1201 01/10/2017 1626 Full Code [id number]  Demetrios Loll, MD ED   04/14/2016 2155 04/17/2016 1804 Full Code [id number]  Ivor Costa, MD ED   04/02/2016 1529 04/03/2016 1621 Full Code [id number]  Demetrios Loll, MD Inpatient      Advance Directive Documentation    Flowsheet Row Most Recent Value   Type of Advance Directive Living will, Healthcare Power of Attorney  Pre-existing out of facility DNR order (yellow form or pink MOST form) --  "MOST" Form in Place? --      Family Communication: Spoke with wife at the bedside Disposition Plan: Status is: Inpatient  Consultants: Cardiology  Antibiotics: Augmentin and doxycycline  [place]  [facility]

## 2021-03-27 ENCOUNTER — Inpatient Hospital Stay: Payer: No Typology Code available for payment source

## 2021-03-27 DIAGNOSIS — I951 Orthostatic hypotension: Secondary | ICD-10-CM

## 2021-03-27 DIAGNOSIS — J1282 Pneumonia due to coronavirus disease 2019: Secondary | ICD-10-CM

## 2021-03-27 LAB — CBC
HCT: 40.8 % (ref 39.0–52.0)
Hemoglobin: 13.7 g/dL (ref 13.0–17.0)
MCH: 31.6 pg (ref 26.0–34.0)
MCHC: 33.6 g/dL (ref 30.0–36.0)
MCV: 94.2 fL (ref 80.0–100.0)
Platelets: 251 10*3/uL (ref 150–400)
RBC: 4.33 MIL/uL (ref 4.22–5.81)
RDW: 13.1 % (ref 11.5–15.5)
WBC: 8.6 10*3/uL (ref 4.0–10.5)
nRBC: 0 % (ref 0.0–0.2)

## 2021-03-27 LAB — BASIC METABOLIC PANEL
Anion gap: 5 (ref 5–15)
BUN: 37 mg/dL — ABNORMAL HIGH (ref 8–23)
CO2: 25 mmol/L (ref 22–32)
Calcium: 8.8 mg/dL — ABNORMAL LOW (ref 8.9–10.3)
Chloride: 108 mmol/L (ref 98–111)
Creatinine, Ser: 1.52 mg/dL — ABNORMAL HIGH (ref 0.61–1.24)
GFR, Estimated: 45 mL/min — ABNORMAL LOW (ref >60)
Glucose, Bld: 131 mg/dL — ABNORMAL HIGH (ref 70–99)
Potassium: 4.1 mmol/L (ref 3.5–5.1)
Sodium: 138 mmol/L (ref 135–145)

## 2021-03-27 MED ORDER — GADOBUTROL 1 MMOL/ML IV SOLN
9.0000 mL | Freq: Once | INTRAVENOUS | Status: AC | PRN
  Administered 2021-03-27: 12:00:00 9 mL via INTRAVENOUS

## 2021-03-27 MED ORDER — SODIUM CHLORIDE 0.9 % IV BOLUS
500.0000 mL | Freq: Once | INTRAVENOUS | Status: AC
  Administered 2021-03-27: 17:00:00 500 mL via INTRAVENOUS

## 2021-03-27 MED ORDER — APIXABAN 5 MG PO TABS
5.0000 mg | ORAL_TABLET | Freq: Two times a day (BID) | ORAL | Status: DC
  Administered 2021-03-28: 10:00:00 5 mg via ORAL
  Filled 2021-03-27: qty 1

## 2021-03-27 NOTE — Progress Notes (Signed)
Physical Therapy Treatment Patient Details Name: Elijah Dilks Sr. MRN: [id number] DOB: 15-Oct-1934 Today's Date: 03/27/2021   History of Present Illness Patient is a 86 year old male with medical history significant for myasthenia gravis, dementia, hyperlipidemia, hypertension, GERD, OSA, glaucoma, insomnia, who presents to the emergency department for chief concerns of syncopal event where patient was found on the ground at home.    PT Comments    The patient is cooperative and able to follow all commands today without difficulty. Functional independence has improved since last session. He is modified independent with bed mobility and transfers. Min guard initially with ambulation progressing to supervision with increased ambulation distance. Mild gait deficits are noted and occasional cues for safety with walking. He ambulated a lap in the hallway and around the room without difficulty, no shortness of breath noted. No loss of  balance noted today with functional activity. I am changing the discharged recommendation to home with HHPT based on improvement in functional independence. PT will continue to follow to maximize independence and facilitate return to prior level of function.    Recommendations for follow up therapy are one component of a multi-disciplinary discharge planning process, led by the attending physician.  Recommendations may be updated based on patient status, additional functional criteria and insurance authorization.  Follow Up Recommendations  Home health PT     Assistance Recommended at Discharge Set up Supervision/Assistance  Equipment Recommendations  None recommended by PT    Recommendations for Other Services       Precautions / Restrictions Precautions Precautions: Fall Restrictions Weight Bearing Restrictions: No     Mobility  Bed Mobility Overal bed mobility: Modified Independent                  Transfers Overall transfer level: Modified  independent                      Ambulation/Gait Ambulation/Gait assistance: Supervision;Min guard Gait Distance (Feet): 175 Feet Assistive device: None Gait Pattern/deviations: Step-through pattern;Decreased stride length Gait velocity: decreased     General Gait Details: Min guard initially progressing to supervision with increased ambulation distance. no loss of balance with ambulation. occasional cues for safety and to avoid obstacles in hallway   Stairs             Wheelchair Mobility    Modified Rankin (Stroke Patients Only)       Balance Overall balance assessment: Needs assistance Sitting-balance support: Feet supported Sitting balance-Leahy Scale: Good     Standing balance support: No upper extremity supported Standing balance-Leahy Scale: Fair Standing balance comment: no loss of balance with standing activity. Min guard to supervision provided for safety                            Cognition Arousal/Alertness: Awake/alert Behavior During Therapy: WFL for tasks assessed/performed Overall Cognitive Status: History of cognitive impairments - at baseline                                 General Comments: no impulsive behavior noted today. patient following all directions without difficulty        Exercises      General Comments        Pertinent Vitals/Pain Pain Assessment: No/denies pain    Home Living  Prior Function            PT Goals (current goals can now be found in the care plan section) Acute Rehab PT Goals Patient Stated Goal: to return home PT Goal Formulation: With patient Time For Goal Achievement: 04/06/21 Potential to Achieve Goals: Fair Progress towards PT goals: Progressing toward goals    Frequency    Min 2X/week      PT Plan Discharge plan needs to be updated    Co-evaluation              AM-PAC PT "6 Clicks" Mobility   Outcome  Measure  Help needed turning from your back to your side while in a flat bed without using bedrails?: None Help needed moving from lying on your back to sitting on the side of a flat bed without using bedrails?: None Help needed moving to and from a bed to a chair (including a wheelchair)?: None Help needed standing up from a chair using your arms (e.g., wheelchair or bedside chair)?: None Help needed to walk in hospital room?: A Little Help needed climbing 3-5 steps with a railing? : A Little 6 Click Score: 22    End of Session   Activity Tolerance: Patient tolerated treatment well Patient left: in bed;with call bell/phone within reach;with bed alarm set Nurse Communication: Mobility status PT Visit Diagnosis: Unsteadiness on feet (R26.81);Muscle weakness (generalized) (M62.81)     Time: 0923-3007 PT Time Calculation (min) (ACUTE ONLY): 24 min  Charges:  $Gait Training: 8-22 mins $Therapeutic Activity: 8-22 mins                     Elijah Jackson, PT, MPT    Elijah Jackson 03/27/2021, 1:31 PM

## 2021-03-27 NOTE — Evaluation (Signed)
Clinical/Bedside Swallow Evaluation Patient Details  Name: Elijah Altschuler Sr. MRN: [id number] Date of Birth: Jun 08, 1934  Today's Date: 03/27/2021 Time: SLP Start Time (ACUTE ONLY): 0940 SLP Stop Time (ACUTE ONLY): 1020 SLP Time Calculation (min) (ACUTE ONLY): 40 min  Past Medical History:  Past Medical History:  Diagnosis Date   Dyslipidemia    GERD (gastroesophageal reflux disease)    Glaucoma    History of stress test    a. 08/2020 ETT: Max HR 125 (91%). 4.6 METS. ECG w/ isolated PVCs, intermittent Mobitz I & II.   Hyperlipidemia    Hypertension    Insomnia    Mobitz type 1 second degree atrioventricular block    Mobitz type II atrioventricular block    Myasthenia gravis ([place]) 09/08/2012   Obesity    Ocular myasthenia gravis ([place])    Pre-syncope    a. 08/2020 Zio: NSVT, Mobitz I, 2 episodes of high degree AVB during sleep.   Prostate cancer [facility])    Past Surgical History:  Past Surgical History:  Procedure Laterality Date   CATARACT EXTRACTION Bilateral    CHOLECYSTECTOMY     TRANSURETHRAL RESECTION OF PROSTATE     HPI:  Pt  is a 86 y.o. male admitted COVID19+ with medical history significant for myasthenia gravis, dementia, hyperlipidemia, hypertension, GERD, OSA, glaucoma, insomnia, who presents to the emergency department for chief concerns of syncopal event. Per spouse, patient was weak last evening so spouse helped him get to bed. This morning she was in the living room when she heard him call out to her. She went to the bedroom and saw him sitting on the floor with his legs underneath him and she helped him get his legs out from underneath him. When they were at home, patient stated to wife, that he thinks he passed out. Per spouse, he was incoherent with EMS. Social history: He states he lives at home with his wife. He denies tobacco use. He states he only drinks on Saturdays, it is 6 beers.  CXR at admit: no active disease.  MRI: No acute intracranial abnormality.   Age-related cerebral atrophy with advanced chronic microvascular  ischemic disease. Superimposed chronic left frontal infarct, with a  few additional tiny remote cerebellar infarcts.    Assessment / Plan / Recommendation  Clinical Impression  Pt appears to present w/ adequate oropharyngeal phase swallow function w/ No overt oropharyngeal phase dysphagia noted, No neuromuscular deficits noted. Pt consumed po trials of thin liquids w/ No overt, clinical s/s of aspiration during po trials. Pt appears at reduced risk for aspiration following general aspiration precautions. During po trials, pt consumed trials of thin liquids w/ no overt coughing, decline in vocal quality, or change in respiratory presentation during/post trials. Oral phase appeared [facility] w/ timely bolus management and control of bolus propulsion for A-P transfer for swallowing. Oral clearing achieved w/ all trial consistencies. Pt and family denied any difficulty chewing/eating solid foods -- he is currently on a Regular diet consistency for foods per MD order. OM Exam appeared [facility] w/ no unilateral weakness noted. Speech Clear. Pt fed self w/ setup support.  Recommend a Regular consistency diet w/ Cut meats, moistened foods; Thin liquids.  Recommend general aspiration precautions, Pills WHOLE in Puree for safer, easier swallowing IF any difficulty swallowing w/ liquids is noted. Education given on Pills in Puree; food consistencies and easy to eat options; general aspiration precautions. NSG to reconsult if any new needs arise. NSG agreed. SLP Visit Diagnosis: Dysphagia, unspecified (R13.10)  Aspiration Risk   (reduced)    Diet Recommendation   Regular consistency diet w/ Cut meats, moistened foods; Thin liquids.  Recommend general aspiration precautions  Medication Administration: Whole meds with liquid (or, in Puree if needed)    Other  Recommendations Recommended Consults:  (n/a) Oral Care Recommendations: Oral care BID;Oral care  before and after PO;Patient independent with oral care Other Recommendations:  (n/a)    Recommendations for follow up therapy are one component of a multi-disciplinary discharge planning process, led by the attending physician.  Recommendations may be updated based on patient status, additional functional criteria and insurance authorization.  Follow up Recommendations No SLP follow up      Assistance Recommended at Discharge None  Functional Status Assessment Patient has had a recent decline in their functional status and demonstrates the ability to make significant improvements in function in a reasonable and predictable amount of time.  Frequency and Duration  (n/a)   (n/a)       Prognosis Prognosis for Safe Diet Advancement: Good      Swallow Study   General Date of Onset: 03/22/21 HPI: Pt  is a 86 y.o. male admitted COVID19+ with medical history significant for myasthenia gravis, dementia, hyperlipidemia, hypertension, GERD, OSA, glaucoma, insomnia, who presents to the emergency department for chief concerns of syncopal event. Per spouse, patient was weak last evening so spouse helped him get to bed. This morning she was in the living room when she heard him call out to her. She went to the bedroom and saw him sitting on the floor with his legs underneath him and she helped him get his legs out from underneath him. When they were at home, patient stated to wife, that he thinks he passed out. Per spouse, he was incoherent with EMS. Social history: He states he lives at home with his wife. He denies tobacco use. He states he only drinks on Saturdays, it is 6 beers.  CXR at admit: no active disease.  MRI: No acute intracranial abnormality.  Age-related cerebral atrophy with advanced chronic microvascular  ischemic disease. Superimposed chronic left frontal infarct, with a  few additional tiny remote cerebellar infarcts. Type of Study: Bedside Swallow Evaluation Previous Swallow Assessment:  none Diet Prior to this Study: Regular;Nectar-thick liquids Temperature Spikes Noted: No (wbc 8.6) Respiratory Status: Room air History of Recent Intubation: No Behavior/Cognition: Alert;Cooperative;Pleasant mood;Distractible;Requires cueing Oral Cavity Assessment: Within Functional Limits Oral Care Completed by SLP: Recent completion by staff Oral Cavity - Dentition: Adequate natural dentition Vision: Functional for self-feeding Self-Feeding Abilities: Able to feed self;Needs set up Patient Positioning: Upright in bed (needed cues to sit up fully) Baseline Vocal Quality: Normal Volitional Cough: Strong    Oral/Motor/Sensory Function Overall Oral Motor/Sensory Function: Within functional limits   Ice Chips Ice chips: Within functional limits Presentation: Spoon (fed; 2 trials)   Thin Liquid Thin Liquid: Within functional limits Presentation: Cup;Self Fed (~4 ozs total) Other Comments: water then juice    Nectar Thick Nectar Thick Liquid: Not tested   Honey Thick Honey Thick Liquid: Not tested   Puree Puree: Not tested   Solid     Solid: Not tested Other Comments: denied any trouble eating solid foods - per pt/family member        Orinda Kenner, MS, [employer] Speech Language Pathologist [facility] [phone] Magdalynn Davilla 03/27/2021,2:16 PM

## 2021-03-27 NOTE — Progress Notes (Signed)
PT Cancellation Note  Patient Details Name: Elijah Sigg Sr. MRN: [id number] DOB: February 17, 1935   Cancelled Treatment:    Reason Eval/Treat Not Completed: Patient at procedure or test/unavailable Patient just went off the floor for a test. PT will return this afternoon for PT treatment. Discussed with the nurse.   Minna Merritts, PT, MPT   Percell Locus 03/27/2021, 11:29 AM

## 2021-03-27 NOTE — Progress Notes (Signed)
Occupational Therapy Treatment Patient Details Name: Elijah Mccaughey Sr. MRN: [id number] DOB: 1935-02-24 Today's Date: 03/27/2021   History of present illness Patient is a 86 year old male with medical history significant for myasthenia gravis, dementia, hyperlipidemia, hypertension, GERD, OSA, glaucoma, insomnia, who presents to the emergency department for chief concerns of syncopal event where patient was found on the ground at home.   OT comments  Upon entering the room, pt supine in bed and agreeable to OT intervention. Pt is pleasant and cooperative overall. Pt performed bed mobility without assistance. Pt standing and ambulating within room for self care tasks and ADLs with supervision. Pt with increased safety awareness this session and needing less cuing throughout. Pt's wife not present for edu this session. Recommendation changed to [facility] secondary to pt's progress.    Recommendations for follow up therapy are one component of a multi-disciplinary discharge planning process, led by the attending physician.  Recommendations may be updated based on patient status, additional functional criteria and insurance authorization.    Follow Up Recommendations  Home health OT    Assistance Recommended at Discharge Set up Supervision/Assistance  Equipment Recommendations  None recommended by OT       Precautions / Restrictions Precautions Precautions: Fall Restrictions Weight Bearing Restrictions: No       Mobility Bed Mobility Overal bed mobility: Modified Independent Bed Mobility: Supine to Sit;Sit to Supine           General bed mobility comments: no physical assistance    Transfers Overall transfer level: Modified independent Equipment used: None Transfers: Sit to/from Stand;Bed to chair/wheelchair/BSC Sit to Stand: Modified independent (Device/Increase time)                 Balance Overall balance assessment: Needs assistance Sitting-balance support: Feet  supported Sitting balance-Leahy Scale: Good     Standing balance support: No upper extremity supported Standing balance-Leahy Scale: Fair Standing balance comment: no loss of balance with standing activity. Min guard to supervision provided for safety                           ADL either performed or assessed with clinical judgement   ADL Overall ADL's : Needs assistance/impaired                                       General ADL Comments: supervision overall without use of AD for functional mobility, transfers, LB dressing.    Extremity/Trunk Assessment Upper Extremity Assessment Upper Extremity Assessment: Generalized weakness   Lower Extremity Assessment Lower Extremity Assessment: Generalized weakness        Vision Patient Visual Report: No change from baseline            Cognition Arousal/Alertness: Awake/alert Behavior During Therapy: WFL for tasks assessed/performed Overall Cognitive Status: History of cognitive impairments - at baseline                                 General Comments: improved safety awareness Functional Status Assessment: Patient has had a recent decline in their functional status and demonstrates the ability to make significant improvements in function in a reasonable and predictable amount of time.                   Pertinent Vitals/ Pain  Pain Assessment: No/denies pain         Frequency  Min 2X/week        Progress Toward Goals  OT Goals(current goals can now be found in the care plan section)  Progress towards OT goals: Progressing toward goals  Acute Rehab OT Goals Patient Stated Goal: to go home OT Goal Formulation: With patient Time For Goal Achievement: 04/08/21 Potential to Achieve Goals: [place] Discharge plan needs to be updated;Frequency remains appropriate       AM-PAC OT "6 Clicks" Daily Activity     Outcome Measure   Help from another person eating  meals?: None Help from another person taking care of personal grooming?: None Help from another person toileting, which includes using toliet, bedpan, or urinal?: A Little Help from another person bathing (including washing, rinsing, drying)?: A Little Help from another person to put on and taking off regular upper body clothing?: None Help from another person to put on and taking off regular lower body clothing?: A Little 6 Click Score: 21    End of Session    OT Visit Diagnosis: Unsteadiness on feet (R26.81);Repeated falls (R29.6);Muscle weakness (generalized) (M62.81);History of falling (Z91.81)   Activity Tolerance Patient tolerated treatment well   Patient Left in bed;with call bell/phone within reach;with bed alarm set   Nurse Communication Mobility status        Time: 2481-8590 OT Time Calculation (min): 15 min  Charges: OT General Charges $OT Visit: 1 Visit OT Treatments $Self Care/Home Management : 8-22 mins  Darleen Crocker, MS, OTR/L , CBIS ascom [phone]  03/27/21, 4:04 PM

## 2021-03-27 NOTE — Consult Note (Signed)
ANTICOAGULATION CONSULT NOTE  Pharmacy Consult for Apixaban Indication: atrial fibrillation  Patient Measurements: Height: 5\' 9"  (175.3 cm) Weight: 89 kg (196 lb 3.4 oz) IBW/kg (Calculated) : 70.7   Labs: Recent Labs    03/27/21 0913  HGB 13.7  HCT 40.8  PLT 251  CREATININE 1.52*    Estimated Creatinine Clearance: 39.9 mL/min (A) (by C-G formula based on SCr of 1.52 mg/dL (H)).   Medical History: Past Medical History:  Diagnosis Date   Dyslipidemia    GERD (gastroesophageal reflux disease)    Glaucoma    History of stress test    a. 08/2020 ETT: Max HR 125 (91%). 4.6 METS. ECG w/ isolated PVCs, intermittent Mobitz I & II.   Hyperlipidemia    Hypertension    Insomnia    Mobitz type 1 second degree atrioventricular block    Mobitz type II atrioventricular block    Myasthenia gravis ([place]) 09/08/2012   Obesity    Ocular myasthenia gravis ([place])    Pre-syncope    a. 08/2020 Zio: NSVT, Mobitz I, 2 episodes of high degree AVB during sleep.   Prostate cancer ([facility])     Medications:  Apixaban 5 mg BID prior to admission (history of recurrent PE / Afib)  Assessment: Patient is an 86 y/o M with medical history as above and including Afib / history of PE on apixaban who is admitted with COVID-19 pneumonia and superimposed bacterial pneumonia. Patient was placed on Paxlovid for treatment and during this time apixaban was placed on hold given drug-drug interaction. Patient has been on therapeutic Lovenox. Patient to complete Paxlovid therapy 12/22. Plan is to re-start apixaban 12/23.   Plan:  --Stop Lovenox after today --Re-start apixaban 5 mg BID tomorrow AM (home dose) --CBC at least every 3 days per protocol  Benita Gutter 03/27/2021,1:47 PM

## 2021-03-27 NOTE — Progress Notes (Signed)
Patient ID: [name], male   DOB: 1934-09-24, 86 y.o.   MRN: [id number] [facility] PROGRESS NOTE  Aaryan Essman Sr. TIR:[id number] DOB: [birth year] DOA: 03/22/2021 PCP: Olin Hauser, DO  HPI/Subjective: Patient feeling okay.  He stated he walked to the bathroom and held onto the counter but felt better on his feet today.  Patient did better this afternoon with physical therapy and was able to walk around the nursing station.  Objective: Vitals:   03/26/21 1745 03/26/21 2020  BP: 140/86 131/70  Pulse: 85 63  Resp: 18 18  Temp:  97.7 F (36.5 C)  SpO2: 95% 94%    Intake/Output Summary (Last 24 hours) at 03/27/2021 1543 Last data filed at 03/26/2021 1700 Gross per 24 hour  Intake 120 ml  Output --  Net 120 ml   Filed Weights   03/22/21 1419  Weight: 89 kg    ROS: Review of Systems  Respiratory:  Negative for shortness of breath.   Cardiovascular:  Negative for chest pain.  Gastrointestinal:  Negative for abdominal pain, nausea and vomiting.  Exam: Physical Exam HENT:     Head: Normocephalic.     Mouth/Throat:     Pharynx: No oropharyngeal exudate.  Eyes:     General: Lids are normal.     Conjunctiva/sclera: Conjunctivae normal.  Cardiovascular:     Rate and Rhythm: Normal rate and regular rhythm.     Heart sounds: Normal heart sounds, S1 normal and S2 normal.  Pulmonary:     Breath sounds: Normal breath sounds. No decreased breath sounds, wheezing, rhonchi or rales.  Abdominal:     Palpations: Abdomen is soft.     Tenderness: There is no abdominal tenderness.  Musculoskeletal:     Right lower leg: No swelling.     Left lower leg: No swelling.  Skin:    General: Skin is warm.     Findings: No rash.  Neurological:     Mental Status: He is alert.     Comments: Answers questions appropriately.      Scheduled Meds:  amoxicillin-clavulanate  1 tablet Oral Q12H   [START ON 03/28/2021] apixaban  5 mg Oral BID   donepezil  10 mg Oral  QHS   doxycycline  100 mg Oral Q12H   enoxaparin (LOVENOX) injection  1 mg/kg Subcutaneous Q12H   feeding supplement  237 mL Oral TID BM   latanoprost  1 drop Both Eyes QHS   lisinopril  20 mg Oral Daily   memantine  10 mg Oral BID   multivitamin with minerals  1 tablet Oral Daily   nirmatrelvir/ritonavir EUA (renal dosing)  2 tablet Oral BID   pantoprazole  80 mg Oral Daily   pyridostigmine  60 mg Oral BID    Assessment/Plan:  Orthostatic hypotension last night.  We will give a fluid bolus and hold off on antihypertensive medications.  Hold lisinopril.  Recheck tomorrow. Syncope.  Linq recorder did did show brief atrial fibrillation.  Seen by cardiology and recommended EP evaluation. Paroxysmal atrial fibrillation.  Potential tachybradycardia syndrome.  Will need EP as outpatient.  On high-dose Lovenox while on Paxil bid.  Will be able to go back on Eliquis tomorrow morning. COVID-19 pneumonia on Paxil bed.  Patient also on Augmentin and doxycycline.  Last Paxil did dose this evening. Weakness and positive Romberg.  MRI of the brain did not show any acute strokes.  Did show chronic left frontal infarct with tiny remote cerebellar infarcts, MRI could not  rule out venous thrombosis.Marland Kitchen  MRV was done also which did not show any venous thrombosis. Urinary retention resolved after discontinuing Foley catheter Myasthenia gravis on pyridostigmine Essential hypertension.  Holding lisinopril with orthostatic hypotension. Dementia on donezepil and Namenda        Code Status:     Code Status Orders  (From admission, onward)           Start     Ordered   03/22/21 2244  Full code  Continuous        03/22/21 2244           Code Status History     Date Active Date Inactive Code Status Order ID Comments User Context   03/31/2018 1308 04/01/2018 1548 Full Code [id number]  Epifanio Lesches, MD ED   01/12/2017 2227 01/14/2017 1431 Full Code [id number]  Phillips Grout, MD Inpatient    01/08/2017 1201 01/10/2017 1626 Full Code [id number]  Demetrios Loll, MD ED   04/14/2016 2155 04/17/2016 1804 Full Code [id number]  Ivor Costa, MD ED   04/02/2016 1529 04/03/2016 1621 Full Code [id number]  Demetrios Loll, MD Inpatient      Advance Directive Documentation    Flowsheet Row Most Recent Value  Type of Advance Directive Living will, Healthcare Power of Attorney  Pre-existing out of facility DNR order (yellow form or pink MOST form) --  "MOST" Form in Place? --      Family Communication: Spoke with wife at the bedside this morning and on the phone this afternoon Disposition Plan: Status is: Inpatient.  Likely discharge tomorrow morning  Case discussed with nursing staff   Loletha Grayer  [facility]

## 2021-03-28 DIAGNOSIS — N1831 Chronic kidney disease, stage 3a: Secondary | ICD-10-CM

## 2021-03-28 LAB — CULTURE, BLOOD (ROUTINE X 2)
Culture: NO GROWTH
Culture: NO GROWTH
Special Requests: ADEQUATE
Special Requests: ADEQUATE

## 2021-03-28 MED ORDER — PREDNISONE 20 MG PO TABS
10.0000 mg | ORAL_TABLET | Freq: Every day | ORAL | Status: DC
  Administered 2021-03-28: 09:00:00 10 mg via ORAL
  Filled 2021-03-28: qty 1

## 2021-03-28 NOTE — Discharge Summary (Signed)
[facility] [place] NAME: Elijah Jackson    MR#:  [id number]  DATE OF BIRTH:  [birth year]  DATE OF ADMISSION:  03/22/2021 ADMITTING PHYSICIAN: Amy N Cox, DO  DATE OF DISCHARGE: 03/28/2021 10:31 AM  PRIMARY CARE PHYSICIAN: Olin Hauser, DO    ADMISSION DIAGNOSIS:  Syncope and collapse [R55] Syncope [R55] COVID-19 virus infection [U07.1] COVID-19 [U07.1]  DISCHARGE DIAGNOSIS:  Principal Problem:   Syncope and collapse Active Problems:   Dizziness and giddiness   Essential hypertension   HLD (hyperlipidemia)   GERD (gastroesophageal reflux disease)   OSA (obstructive sleep apnea)   Pneumonia due to COVID-19 virus   AF (paroxysmal atrial fibrillation) (HCC)   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 08/2020 ETT: Max HR 125 (91%). 4.6 METS. ECG w/ isolated PVCs, intermittent Mobitz I & II.   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 08/2020 Zio: NSVT, Mobitz I, 2 episodes of high degree AVB during sleep.   Prostate cancer [facility])     HOSPITAL COURSE:   Syncope Linq recorder did show brief atrial fibrillation.  Seen by cardiology and recommended outpatient EP evaluation.  Controlling medications with history of heart block. Paroxysmal atrial fibrillation.  Potential tachybradycardia syndrome.  Will need outpatient EP follow-up.  Continue Eliquis for anticoagulation Orthostatic hypotension the other day.  Given fluid bolus.  Holding  lisinopril.  Not orthostatic upon discharge.  TED hose. COVID-19 pneumonia right upper lobe.  Completed 5 days of Paxlovid.  Completed 5 days of antibiotics completed Augmentin and doxycycline.  Blood culture likely skin contaminant with repeat blood cultures negative.  Recommend repeat chest x-ray in 6 weeks to ensure clearing. Weakness and positive Romberg the other day.  MRI of the brain did not show any acute strokes.  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  MRV did not show any venous thrombosis. Myasthenia gravis on pyridostigmine Essential hypertension.  Holding lisinopril with orthostatic hypotension.  Before starting any antihypertensive medications must check orthostatics. Dementia on donezepil and Namenda CKD stage IIIa with GFR 45 upon disposition  DISCHARGE CONDITIONS:   Satisfactory  CONSULTS OBTAINED:  Cardiology  DRUG ALLERGIES:   Allergies  Allergen Reactions   Pravastatin Other (See Comments)   Simvastatin Other (See Comments)    DISCHARGE MEDICATIONS:   Allergies as of 03/28/2021       Reactions   Pravastatin Other (See Comments)   Simvastatin Other (See Comments)        Medication List     STOP taking these medications    cyclobenzaprine 10 MG tablet Commonly known as: FLEXERIL   gabapentin 100 MG capsule Commonly known as: NEURONTIN   lisinopril 40 MG tablet Commonly known as: ZESTRIL   methocarbamol 500 MG tablet Commonly known as: ROBAXIN       TAKE these medications    acetaminophen 325 MG tablet Commonly known as: TYLENOL Take  1 tablet (325 mg total) by mouth every 6 (six) hours as needed for mild pain (or Fever >/= 101).   apixaban 5 MG Tabs tablet Commonly known as: ELIQUIS Take 1 tablet (5 mg total) by mouth 2 (two) times daily.   donepezil 10 MG tablet Commonly known as: ARICEPT Take 10 mg by mouth at bedtime.   feeding supplement Liqd Take 237 mLs by mouth 2 (two) times daily  between meals.   latanoprost 0.005 % ophthalmic solution Commonly known as: XALATAN Place 1 drop into both eyes at bedtime.   memantine 10 MG tablet Commonly known as: NAMENDA Take 20 mg by mouth at bedtime.   omeprazole 40 MG capsule Commonly known as: PRILOSEC Take 1 capsule (40 mg total) by mouth 2 (two) times daily before a meal.   predniSONE 5 MG tablet Commonly known as: DELTASONE Take 5 mg by mouth daily. (Take with 2mg  tablets to equal 7mg  total)   predniSONE 1 MG tablet Commonly known as: DELTASONE Take 2 mg by mouth daily with breakfast. (Take with 5mg  tablet to equal 7mg  total)   pyridostigmine 60 MG tablet Commonly known as: MESTINON Take 60 mg by mouth 2 (two) times daily.   senna-docusate 8.6-50 MG tablet Commonly known as: Senokot-S Take 1 tablet by mouth at bedtime as needed for mild constipation.   Vitamins/Minerals Tabs Take 1 tablet by mouth daily.         DISCHARGE INSTRUCTIONS:   Follow-up PMD 5 days Follow-up EP specialist as scheduled  If you experience worsening of your admission symptoms, develop shortness of breath, life threatening emergency, suicidal or homicidal thoughts you must seek medical attention immediately by calling 911 or calling your MD immediately  if symptoms less severe.  You Must read complete instructions/literature along with all the possible adverse reactions/side effects for all the Medicines you take and that have been prescribed to you. Take any new Medicines after you have completely understood and accept all the possible adverse reactions/side effects.   Please note  You were cared for by a hospitalist during your hospital stay. If you have any questions about your discharge medications or the care you received while you were in the hospital after you are discharged, you can call the unit and asked to speak with the hospitalist on call if the hospitalist that took care of you is not available. Once you are  discharged, your primary care physician will handle any further medical issues. Please note that NO REFILLS for any discharge medications will be authorized once you are discharged, as it is imperative that you return to your primary care physician (or establish a relationship with a primary care physician if you do not have one) for your aftercare needs so that they can reassess your need for medications and monitor your lab values.    Today   CHIEF COMPLAINT:   Chief Complaint  Patient presents with   Weakness    HISTORY OF PRESENT ILLNESS:  Elijah Jackson  is a 86 y.o. male came in with weakness and syncope and found to have COVID-19 infection   VITAL SIGNS:  Blood pressure 130/75, pulse 79, temperature 99.3 F (37.4 C), temperature source Oral, resp. rate 16, height 5\' 9"  (1.753 m), weight 89 kg, SpO2 95 %.  I/O:   Intake/Output Summary (Last 24 hours) at 03/28/2021 1650 Last data filed at 03/27/2021 2100 Gross per 24 hour  Intake --  Output 200 ml  Net -200 ml    PHYSICAL  EXAMINATION:  GENERAL:  86 y.o.-year-old patient lying in the bed with no acute distress.  EYES: Pupils equal, round, reactive to light and accommodation. No scleral icterus. Extraocular muscles intact.  HEENT: Head atraumatic, normocephalic. Oropharynx and nasopharynx clear.  LUNGS: Normal breath sounds bilaterally, no wheezing, rales,rhonchi or crepitation. No use of accessory muscles of respiration.  CARDIOVASCULAR: S1, S2 normal. No murmurs, rubs, or gallops.  ABDOMEN: Soft, non-tender, non-distended. Bowel sounds present. No organomegaly or mass.  EXTREMITIES: No pedal edema, cyanosis, or clubbing.  NEUROLOGIC: Cranial nerves II through XII are intact. Muscle strength 5/5 in all extremities. Sensation intact. Gait not checked.  PSYCHIATRIC: The patient is alert and oriented x 3.  SKIN: No obvious rash, lesion, or ulcer.   DATA REVIEW:   CBC Recent Labs  Lab 03/27/21 0913  WBC 8.6  HGB 13.7   HCT 40.8  PLT 251    Chemistries  Recent Labs  Lab 03/22/21 1432 03/23/21 0437 03/27/21 0913  NA 133*   < > 138  K 4.2   < > 4.1  CL 100   < > 108  CO2 25   < > 25  GLUCOSE 114*   < > 131*  BUN 22   < > 37*  CREATININE 1.54*   < > 1.52*  CALCIUM 9.0   < > 8.8*  AST 23  --   --   ALT 19  --   --   ALKPHOS 64  --   --   BILITOT 1.6*  --   --    < > = values in this interval not displayed.     Microbiology Results  Results for orders placed or performed during the hospital encounter of 03/22/21  Resp Panel by RT-PCR (Flu A&B, Covid) Nasopharyngeal Swab     Status: Abnormal   Collection Time: 03/22/21  8:52 PM   Specimen: Nasopharyngeal Swab; Nasopharyngeal(NP) swabs in vial transport medium  Result Value Ref Range Status   SARS Coronavirus 2 by RT PCR POSITIVE (A) NEGATIVE Final    Comment: (NOTE) SARS-CoV-2 target nucleic acids are DETECTED.  The SARS-CoV-2 RNA is generally detectable in upper respiratory specimens during the acute phase of infection. Positive results are indicative of the presence of the identified virus, but do not rule out bacterial infection or co-infection with other pathogens not detected by the test. Clinical correlation with patient history and other diagnostic information is necessary to determine patient infection status. The expected result is Negative.  Fact Sheet for Patients: EntrepreneurPulse.com.au  Fact Sheet for Healthcare Providers: IncredibleEmployment.be  This test is not yet approved or cleared by the [place] FDA and  has been authorized for detection and/or diagnosis of SARS-CoV-2 by FDA under an Emergency Use Authorization (EUA).  This EUA will remain in effect (meaning this test can be used) for the duration of  the COVID-19 declaration under Section 564(b)(1) of the A ct, 21 U.S.C. section 360bbb-3(b)(1), unless the authorization is terminated or revoked sooner.     Influenza  A by PCR NEGATIVE NEGATIVE Final   Influenza B by PCR NEGATIVE NEGATIVE Final    Comment: (NOTE) The Xpert Xpress SARS-CoV-2/FLU/RSV plus assay is intended as an aid in the diagnosis of influenza from Nasopharyngeal swab specimens and should not be used as a sole basis for treatment. Nasal washings and aspirates are unacceptable for Xpert Xpress SARS-CoV-2/FLU/RSV testing.  Fact Sheet for Patients: EntrepreneurPulse.com.au  Fact Sheet for Healthcare Providers: IncredibleEmployment.be  This test is not yet  approved or cleared by the [place] and has been authorized for detection and/or diagnosis of SARS-CoV-2 by FDA under an Emergency Use Authorization (EUA). This EUA will remain in effect (meaning this test can be used) for the duration of the COVID-19 declaration under Section 564(b)(1) of the Act, 21 U.S.C. section 360bbb-3(b)(1), unless the authorization is terminated or revoked.  Performed at [facility], [address] [place], [place] [address]   Blood Culture (routine x 2)     Status: Abnormal   Collection Time: 03/22/21  8:52 PM   Specimen: BLOOD  Result Value Ref Range Status   Specimen Description   Final    BLOOD LEFT ANTECUBITAL Performed at [facility], [address] [place], [place] [address]    Special Requests   Final    BOTTLES DRAWN AEROBIC AND ANAEROBIC Blood Culture adequate volume Performed at [facility], [address], [place], [place] [address]    Culture  Setup Time   Final    GRAM POSITIVE COCCI ANAEROBIC BOTTLE ONLY Performed at [facility], [address] [place], [place] [address]    Culture (A)  Final    STAPHYLOCOCCUS CAPITIS SUSCEPTIBILITIES PERFORMED ON PREVIOUS CULTURE WITHIN THE LAST 5 DAYS. Performed at [facility], [place] [address] [place], [place] [address]    Report Status 03/26/2021 FINAL  Final  Urine Culture     Status: None    Collection Time: 03/22/21  8:52 PM   Specimen: In/Out Cath Urine  Result Value Ref Range Status   Specimen Description   Final    IN/OUT CATH URINE Performed at [facility], [address] [place], [place] [address]    Special Requests   Final    NONE Performed at [facility], [address] [place], [place] [address]    Culture   Final    NO GROWTH Performed at [facility], [place] [address], [place], [place] [address]    Report Status 03/24/2021 FINAL  Final  Blood Culture (routine x 2)     Status: Abnormal   Collection Time: 03/22/21  9:00 PM   Specimen: BLOOD  Result Value Ref Range Status   Specimen Description   Final    BLOOD RIGHT ANTECUBITAL Performed at [facility], [address] [place], [place] [address]    Special Requests   Final    BOTTLES DRAWN AEROBIC AND ANAEROBIC Blood Culture adequate volume Performed at [facility], [address], [place], [place] [address]    Culture  Setup Time   Final    GRAM POSITIVE COCCI IN BOTH AEROBIC AND ANAEROBIC BOTTLES CRITICAL RESULT CALLED TO, READ BACK BY AND VERIFIED WITH: SAMANTHA RAUER AT 1603 03/23/21.PMF    Culture (A)  Final    STAPHYLOCOCCUS CAPITIS STAPHYLOCOCCUS AURICULARIS THE SIGNIFICANCE OF ISOLATING THIS ORGANISM FROM A SINGLE SET OF BLOOD CULTURES WHEN MULTIPLE SETS ARE DRAWN IS UNCERTAIN. PLEASE NOTIFY THE MICROBIOLOGY DEPARTMENT WITHIN ONE WEEK IF SPECIATION AND SENSITIVITIES ARE REQUIRED. Performed at [facility], [place] [address], [place], [place] [address]    Report Status 03/26/2021 FINAL  Final   Organism ID, Bacteria STAPHYLOCOCCUS CAPITIS  Final      Susceptibility   Staphylococcus capitis - MIC*    CIPROFLOXACIN <=0.5 SENSITIVE Sensitive     ERYTHROMYCIN <=0.25 SENSITIVE Sensitive     GENTAMICIN <=0.5 SENSITIVE Sensitive     OXACILLIN SENSITIVE Sensitive  TETRACYCLINE <=1 SENSITIVE Sensitive     VANCOMYCIN 1 SENSITIVE Sensitive      TRIMETH/SULFA <=10 SENSITIVE Sensitive     CLINDAMYCIN <=0.25 SENSITIVE Sensitive     RIFAMPIN <=0.5 SENSITIVE Sensitive     Inducible Clindamycin NEGATIVE Sensitive     * STAPHYLOCOCCUS CAPITIS  Blood Culture ID Panel (Reflexed)     Status: Abnormal   Collection Time: 03/22/21  9:00 PM  Result Value Ref Range Status   Enterococcus faecalis NOT DETECTED NOT DETECTED Final   Enterococcus Faecium NOT DETECTED NOT DETECTED Final   Listeria monocytogenes NOT DETECTED NOT DETECTED Final   Staphylococcus species DETECTED (A) NOT DETECTED Final    Comment: CRITICAL RESULT CALLED TO, READ BACK BY AND VERIFIED WITH: SAMANTHA RAUER AT 1603 03/23/21.PMF    Staphylococcus aureus (BCID) NOT DETECTED NOT DETECTED Final   Staphylococcus epidermidis NOT DETECTED NOT DETECTED Final   Staphylococcus lugdunensis NOT DETECTED NOT DETECTED Final   Streptococcus species NOT DETECTED NOT DETECTED Final   Streptococcus agalactiae NOT DETECTED NOT DETECTED Final   Streptococcus pneumoniae NOT DETECTED NOT DETECTED Final   Streptococcus pyogenes NOT DETECTED NOT DETECTED Final   A.calcoaceticus-baumannii NOT DETECTED NOT DETECTED Final   Bacteroides fragilis NOT DETECTED NOT DETECTED Final   Enterobacterales NOT DETECTED NOT DETECTED Final   Enterobacter cloacae complex NOT DETECTED NOT DETECTED Final   Escherichia coli NOT DETECTED NOT DETECTED Final   Klebsiella aerogenes NOT DETECTED NOT DETECTED Final   Klebsiella oxytoca NOT DETECTED NOT DETECTED Final   Klebsiella pneumoniae NOT DETECTED NOT DETECTED Final   Proteus species NOT DETECTED NOT DETECTED Final   Salmonella species NOT DETECTED NOT DETECTED Final   Serratia marcescens NOT DETECTED NOT DETECTED Final   Haemophilus influenzae NOT DETECTED NOT DETECTED Final   Neisseria meningitidis NOT DETECTED NOT DETECTED Final   Pseudomonas aeruginosa NOT DETECTED NOT DETECTED Final   Stenotrophomonas maltophilia NOT DETECTED NOT DETECTED Final   Candida  albicans NOT DETECTED NOT DETECTED Final   Candida auris NOT DETECTED NOT DETECTED Final   Candida glabrata NOT DETECTED NOT DETECTED Final   Candida krusei NOT DETECTED NOT DETECTED Final   Candida parapsilosis NOT DETECTED NOT DETECTED Final   Candida tropicalis NOT DETECTED NOT DETECTED Final   Cryptococcus neoformans/gattii NOT DETECTED NOT DETECTED Final    Comment: Performed at [facility], [address] [place], [place] [address]  CULTURE, BLOOD (ROUTINE X 2) w Reflex to ID Panel     Status: None   Collection Time: 03/23/21  4:33 PM   Specimen: BLOOD  Result Value Ref Range Status   Specimen Description BLOOD BLOOD RIGHT ARM  Final   Special Requests   Final    BOTTLES DRAWN AEROBIC AND ANAEROBIC Blood Culture adequate volume   Culture   Final    NO GROWTH 5 DAYS Performed at [facility], [address] [place], [place] [address]    Report Status 03/28/2021 FINAL  Final  CULTURE, BLOOD (ROUTINE X 2) w Reflex to ID Panel     Status: None   Collection Time: 03/23/21  4:33 PM   Specimen: BLOOD  Result Value Ref Range Status   Specimen Description BLOOD RIGHT ANTECUBITAL  Final   Special Requests   Final    BOTTLES DRAWN AEROBIC AND ANAEROBIC Blood Culture adequate volume   Culture   Final    NO GROWTH 5 DAYS Performed at [facility], [address], [place], [place] [address]  Report Status 03/28/2021 FINAL  Final    RADIOLOGY:  MR BRAIN WO CONTRAST  Result Date: 03/26/2021 CLINICAL DATA:  Initial evaluation for acute dizziness. EXAM: MRI HEAD WITHOUT CONTRAST TECHNIQUE: Multiplanar, multiecho pulse sequences of the brain and surrounding structures were obtained without intravenous contrast. COMPARISON:  Comparison made with prior CT from 03/22/2021 FINDINGS: Brain: Diffuse prominence of the CSF containing spaces compatible with generalized age-related cerebral atrophy. Extensive patchy and confluent T2/FLAIR hyperintensity involving the  periventricular, deep, and subcortical white matter of both cerebral hemispheres, with additional more mild patchy involvement of the deep gray nuclei and pons. Changes are most consistent with advanced chronic microvascular ischemic disease. Area of encephalomalacia involving the anterior left frontal lobe consistent with a chronic left MCA distribution infarct. Few scattered tiny remote bilateral cerebellar infarcts noted as well. No abnormal foci of restricted diffusion to suggest acute or subacute ischemia. Gray-white matter differentiation maintained. No foci of susceptibility artifact to suggest acute or chronic intracranial hemorrhage. No mass lesion, midline shift or mass effect. Mild ventricular prominence related to global parenchymal volume loss without hydrocephalus. No extra-axial fluid collection. Pituitary gland suprasellar region normal. Midline structures intact. Vascular: Major intracranial arterial vascular flow voids are maintained. Asymmetric FLAIR signal intensity noted involving the distal left transverse and sigmoid sinuses, likely distal to the vein of Labbe (series 15, image 20). While this likely reflects slow/sluggish flow, possible dural sinus thrombosis is difficult to exclude. Skull and upper cervical spine: Craniocervical junction within normal limits. Bone marrow signal intensity normal. No scalp soft tissue abnormality. Sinuses/Orbits: Patient status post bilateral ocular lens replacement. Globes and orbital soft tissues demonstrate no acute finding. Scattered mucosal thickening noted throughout the paranasal sinuses. Trace layering fluid noted within the right sphenoid sinus. No significant mastoid effusion. Inner ear structures grossly normal. Other: None. IMPRESSION: 1. No acute intracranial abnormality. 2. Asymmetric FLAIR signal intensity involving the distal left transverse and sigmoid sinuses. While this likely reflects slow/sluggish flow, possible dural sinus thrombosis is  difficult to exclude. Further assessment with dedicated MRV suggested for further evaluation as clinically warranted. 3. Age-related cerebral atrophy with advanced chronic microvascular ischemic disease. Superimposed chronic left frontal infarct, with a few additional tiny remote cerebellar infarcts. Electronically Signed   By: Jeannine Boga M.D.   On: 03/26/2021 23:48   MR MRV HEAD W WO CONTRAST  Result Date: 03/27/2021 CLINICAL DATA:  Suspect dural venous sinus thrombosis. EXAM: MR VENOGRAM HEAD WITHOUT AND WITH CONTRAST TECHNIQUE: Angiographic images of the intracranial venous structures were acquired using MRV technique without and with intravenous contrast. CONTRAST:  81mL GADAVIST GADOBUTROL 1 MMOL/ML IV SOLN COMPARISON:  MRI head 03/26/2021.  CT head 03/22/2021 FINDINGS: Superior sagittal sinus normal. Right transverse sinus dominant. Right transverse sinus and sigmoid sinus widely patent. Hypoplastic left transverse sinus. There is a small filling defect, 5 x 10 mm, in the mid left transverse sinus. Remainder of the left transverse sinus enhances normally. This small filling defect would not account for the finding seen on MRI yesterday which is therefore felt to be most likely due to flow related artifact. IMPRESSION: Non dominant left transverse sinus. Increased signal on FLAIR and T2 on yesterday's study in the left transverse sinus most likely flow related artifact. There is a small filling defect left transverse sinus which could represent arachnoid granulation or flow related artifact. If symptoms progress, follow-up MR venogram with contrast may be helpful. Electronically Signed   By: Franchot Gallo M.D.   On: 03/27/2021 12:11  Management plans discussed with the patient, family and they are in agreement.  CODE STATUS:  Code Status History     Date Active Date Inactive Code Status Order ID Comments User Context   03/22/2021 2244 03/28/2021 1532 Full Code [id number]  Cox[facility] Cedar,  DO ED   03/31/2018 1308 04/01/2018 1548 Full Code [id number]  Epifanio Lesches, MD ED   01/12/2017 2227 01/14/2017 1431 Full Code [id number]  Phillips Grout, MD Inpatient   01/08/2017 1201 01/10/2017 1626 Full Code [id number]  Demetrios Loll, MD ED   04/14/2016 2155 04/17/2016 1804 Full Code [id number]  Ivor Costa, MD ED   04/02/2016 1529 04/03/2016 1621 Full Code [id number]  Demetrios Loll, MD Inpatient      Advance Directive Documentation    Flowsheet Row Most Recent Value  Type of Advance Directive Living will, Healthcare Power of Attorney  Pre-existing out of facility DNR order (yellow form or pink MOST form) --  "MOST" Form in Place? --       TOTAL TIME TAKING CARE OF THIS PATIENT: 34 minutes.    Loletha Grayer M.D on 03/28/2021 at 4:50 PM  [facility]  CC: Primary care physician; Olin Hauser, DO

## 2021-03-28 NOTE — TOC Progression Note (Signed)
Transition of Care (TOC) - Progression Note    Patient Details  Name: Elijah Jackson. MRN: [id number] Date of Birth: 24-Feb-1935  Transition of Care [facility]) CM/SW Rebecca, LCSW Phone Number: 03/28/2021, 8:43 AM  Clinical Narrative:   Patient's wife is agreeable to home health. She confirmed he does not have a PCP affiliated with the [place]. Calling around to home health agencies to see if anyone can accept him. He has not had home health in the past. No DME recommendations.  Expected Discharge Plan and Services           Expected Discharge Date: 03/28/21                                     Social Determinants of Health (SDOH) Interventions    Readmission Risk Interventions No flowsheet data found.

## 2021-03-28 NOTE — TOC Transition Note (Signed)
Transition of Care [facility]) - CM/SW Discharge Note   Patient Details  Name: Elijah Usery Sr. MRN: [id number] Date of Birth: Apr 30, 1934  [facility]) CM/SW Contact:  Candie Chroman, LCSW Phone Number: 03/28/2021, 8:55 AM   Clinical Narrative:   Patient has orders to discharge home today. Set up with [facility] for PT and OT. They will start authorization through the [place] today. May not get approval until Monday. Wife is aware and agreeable. No further concerns. CSW signing off.  Final next level of care: Home w Home Health Services Barriers to Discharge: Barriers Resolved   Patient Goals and CMS Choice     Choice offered to / list presented to : Spouse  Discharge Placement                  Name of family member notified: Elijah Jackson Patient and family notified of of transfer: 03/28/21  Discharge Plan and Services                          HH Arranged: PT, OT [place] Agency: [place] ([place]) Date HH Agency Contacted: 03/28/21   Representative spoke with at [place]: Wylene Men  Social Determinants of Health ([place]) Interventions     Readmission Risk Interventions No flowsheet data found.

## 2021-03-28 NOTE — Care Plan (Signed)
Pt is alert and oriented to person only. V/S stable. Denies any pain or SOB. Oxygen saturation at 95% on RA.

## 2021-04-04 ENCOUNTER — Other Ambulatory Visit: Payer: Self-pay

## 2021-04-04 ENCOUNTER — Inpatient Hospital Stay: Payer: Medicare Other | Admitting: Family Medicine

## 2021-04-04 ENCOUNTER — Inpatient Hospital Stay (HOSPITAL_COMMUNITY)
Admission: EM | Admit: 2021-04-04 | Discharge: 2021-04-15 | DRG: 177 | Disposition: A | Discharge status: Home Health | Payer: No Typology Code available for payment source | Attending: Internal Medicine | Admitting: Internal Medicine

## 2021-04-04 DIAGNOSIS — D72828 Other elevated white blood cell count: Secondary | ICD-10-CM | POA: Diagnosis present

## 2021-04-04 DIAGNOSIS — R5381 Other malaise: Secondary | ICD-10-CM | POA: Diagnosis present

## 2021-04-04 DIAGNOSIS — U071 COVID-19: Principal | ICD-10-CM

## 2021-04-04 DIAGNOSIS — Z7901 Long term (current) use of anticoagulants: Secondary | ICD-10-CM

## 2021-04-04 DIAGNOSIS — J189 Pneumonia, unspecified organism: Secondary | ICD-10-CM | POA: Diagnosis not present

## 2021-04-04 DIAGNOSIS — I130 Hypertensive heart and chronic kidney disease with heart failure and stage 1 through stage 4 chronic kidney disease, or unspecified chronic kidney disease: Secondary | ICD-10-CM | POA: Diagnosis present

## 2021-04-04 DIAGNOSIS — E860 Dehydration: Secondary | ICD-10-CM | POA: Diagnosis present

## 2021-04-04 DIAGNOSIS — Z86718 Personal history of other venous thrombosis and embolism: Secondary | ICD-10-CM

## 2021-04-04 DIAGNOSIS — I480 Paroxysmal atrial fibrillation: Secondary | ICD-10-CM | POA: Diagnosis present

## 2021-04-04 DIAGNOSIS — Z86711 Personal history of pulmonary embolism: Secondary | ICD-10-CM

## 2021-04-04 DIAGNOSIS — I5032 Chronic diastolic (congestive) heart failure: Secondary | ICD-10-CM | POA: Diagnosis present

## 2021-04-04 DIAGNOSIS — Z79899 Other long term (current) drug therapy: Secondary | ICD-10-CM

## 2021-04-04 DIAGNOSIS — F05 Delirium due to known physiological condition: Secondary | ICD-10-CM | POA: Diagnosis present

## 2021-04-04 DIAGNOSIS — G7000 Myasthenia gravis without (acute) exacerbation: Secondary | ICD-10-CM | POA: Diagnosis present

## 2021-04-04 DIAGNOSIS — F0390 Unspecified dementia without behavioral disturbance: Secondary | ICD-10-CM | POA: Diagnosis present

## 2021-04-04 DIAGNOSIS — Z0184 Encounter for antibody response examination: Secondary | ICD-10-CM

## 2021-04-04 DIAGNOSIS — E785 Hyperlipidemia, unspecified: Secondary | ICD-10-CM | POA: Diagnosis present

## 2021-04-04 DIAGNOSIS — J9601 Acute respiratory failure with hypoxia: Secondary | ICD-10-CM | POA: Diagnosis present

## 2021-04-04 DIAGNOSIS — Z8546 Personal history of malignant neoplasm of prostate: Secondary | ICD-10-CM

## 2021-04-04 DIAGNOSIS — J1282 Pneumonia due to coronavirus disease 2019: Secondary | ICD-10-CM | POA: Diagnosis present

## 2021-04-04 DIAGNOSIS — Z7952 Long term (current) use of systemic steroids: Secondary | ICD-10-CM

## 2021-04-04 DIAGNOSIS — J159 Unspecified bacterial pneumonia: Secondary | ICD-10-CM | POA: Diagnosis present

## 2021-04-04 DIAGNOSIS — Z87891 Personal history of nicotine dependence: Secondary | ICD-10-CM

## 2021-04-04 DIAGNOSIS — R159 Full incontinence of feces: Secondary | ICD-10-CM | POA: Diagnosis present

## 2021-04-04 DIAGNOSIS — N182 Chronic kidney disease, stage 2 (mild): Secondary | ICD-10-CM | POA: Diagnosis present

## 2021-04-04 DIAGNOSIS — K219 Gastro-esophageal reflux disease without esophagitis: Secondary | ICD-10-CM | POA: Diagnosis present

## 2021-04-04 DIAGNOSIS — I4430 Unspecified atrioventricular block: Secondary | ICD-10-CM | POA: Diagnosis present

## 2021-04-04 DIAGNOSIS — Z8249 Family history of ischemic heart disease and other diseases of the circulatory system: Secondary | ICD-10-CM

## 2021-04-04 LAB — COMPREHENSIVE METABOLIC PANEL
ALT: 53 U/L — ABNORMAL HIGH (ref 0–44)
AST: 29 U/L (ref 15–41)
Albumin: 2.7 g/dL — ABNORMAL LOW (ref 3.5–5.0)
Alkaline Phosphatase: 52 U/L (ref 38–126)
Anion gap: 8 (ref 5–15)
BUN: 24 mg/dL — ABNORMAL HIGH (ref 8–23)
CO2: 23 mmol/L (ref 22–32)
Calcium: 8.2 mg/dL — ABNORMAL LOW (ref 8.9–10.3)
Chloride: 104 mmol/L (ref 98–111)
Creatinine, Ser: 1.7 mg/dL — ABNORMAL HIGH (ref 0.61–1.24)
GFR, Estimated: 39 mL/min — ABNORMAL LOW (ref >60)
Glucose, Bld: 125 mg/dL — ABNORMAL HIGH (ref 70–99)
Potassium: 4 mmol/L (ref 3.5–5.1)
Sodium: 135 mmol/L (ref 135–145)
Total Bilirubin: 1 mg/dL (ref 0.3–1.2)
Total Protein: 6 g/dL — ABNORMAL LOW (ref 6.5–8.1)

## 2021-04-04 LAB — CBC
HCT: 41.8 % (ref 39.0–52.0)
Hemoglobin: 14.1 g/dL (ref 13.0–17.0)
MCH: 32 pg (ref 26.0–34.0)
MCHC: 33.7 g/dL (ref 30.0–36.0)
MCV: 95 fL (ref 80.0–100.0)
Platelets: 234 10*3/uL (ref 150–400)
RBC: 4.4 MIL/uL (ref 4.22–5.81)
RDW: 13.1 % (ref 11.5–15.5)
WBC: 12.6 10*3/uL — ABNORMAL HIGH (ref 4.0–10.5)
nRBC: 0 % (ref 0.0–0.2)

## 2021-04-04 NOTE — ED Provider Notes (Signed)
Emergency Medicine Provider Triage Evaluation Note  Dale Ribeiro Sr. , a 86 y.o. male  was evaluated in triage.  Pt complains of bowel incontinence x1 week. Denies urinary incontinence or abd pain  Level 5 caveat, pt with dementia  Review of Systems  Positive: Bowel incontinence Negative: urinary incontinence or abd pain  Physical Exam  BP 123/77 (BP Location: Right Arm)    Pulse 82    Temp 98.4 F (36.9 C)    Resp 16    SpO2 95%  Gen:   Awake, no distress   Resp:  Normal effort  MSK:   Moves extremities without difficulty  Other:  Abd soft and nontender  Medical Decision Making  Medically screening exam initiated at 11:11 PM.  Appropriate orders placed.  Telford Elias Sr. was informed that the remainder of the evaluation will be completed by another provider, this initial triage assessment does not replace that evaluation, and the importance of remaining in the ED until their evaluation is complete.     Rodney Booze, PA-C 04/04/21 2314    Veryl Speak, MD 04/05/21 [phone]

## 2021-04-04 NOTE — ED Triage Notes (Signed)
Pt BIB unknown family member. In triage he is unable to states why he is here. He says "i'm just not getting better." Check in complain UTI, pt unable to elaborate. Called spouse x2 on home phone and cell phone to collaborate information. PA aware.

## 2021-04-04 NOTE — ED Notes (Signed)
Attempted to call spouse no answer. Phone rings once and then is sent to voicemail. Pt to wait for room in triage.

## 2021-04-04 NOTE — ED Triage Notes (Signed)
Unable to collaborate information with family. Note dementia in history. No fever VS WDL

## 2021-04-05 ENCOUNTER — Emergency Department (HOSPITAL_COMMUNITY): Payer: No Typology Code available for payment source

## 2021-04-05 DIAGNOSIS — K219 Gastro-esophageal reflux disease without esophagitis: Secondary | ICD-10-CM | POA: Diagnosis present

## 2021-04-05 DIAGNOSIS — R5381 Other malaise: Secondary | ICD-10-CM | POA: Diagnosis not present

## 2021-04-05 DIAGNOSIS — I4430 Unspecified atrioventricular block: Secondary | ICD-10-CM | POA: Diagnosis present

## 2021-04-05 DIAGNOSIS — E860 Dehydration: Secondary | ICD-10-CM | POA: Diagnosis present

## 2021-04-05 DIAGNOSIS — U071 COVID-19: Secondary | ICD-10-CM | POA: Diagnosis present

## 2021-04-05 DIAGNOSIS — I130 Hypertensive heart and chronic kidney disease with heart failure and stage 1 through stage 4 chronic kidney disease, or unspecified chronic kidney disease: Secondary | ICD-10-CM | POA: Diagnosis present

## 2021-04-05 DIAGNOSIS — Z79899 Other long term (current) drug therapy: Secondary | ICD-10-CM | POA: Diagnosis not present

## 2021-04-05 DIAGNOSIS — R159 Full incontinence of feces: Secondary | ICD-10-CM | POA: Diagnosis present

## 2021-04-05 DIAGNOSIS — E785 Hyperlipidemia, unspecified: Secondary | ICD-10-CM | POA: Diagnosis present

## 2021-04-05 DIAGNOSIS — J9601 Acute respiratory failure with hypoxia: Secondary | ICD-10-CM | POA: Diagnosis present

## 2021-04-05 DIAGNOSIS — Z7901 Long term (current) use of anticoagulants: Secondary | ICD-10-CM | POA: Diagnosis not present

## 2021-04-05 DIAGNOSIS — F0390 Unspecified dementia without behavioral disturbance: Secondary | ICD-10-CM | POA: Diagnosis present

## 2021-04-05 DIAGNOSIS — Z87891 Personal history of nicotine dependence: Secondary | ICD-10-CM | POA: Diagnosis not present

## 2021-04-05 DIAGNOSIS — Z7952 Long term (current) use of systemic steroids: Secondary | ICD-10-CM | POA: Diagnosis not present

## 2021-04-05 DIAGNOSIS — J189 Pneumonia, unspecified organism: Secondary | ICD-10-CM | POA: Diagnosis present

## 2021-04-05 DIAGNOSIS — J159 Unspecified bacterial pneumonia: Secondary | ICD-10-CM | POA: Diagnosis present

## 2021-04-05 DIAGNOSIS — I5032 Chronic diastolic (congestive) heart failure: Secondary | ICD-10-CM | POA: Diagnosis present

## 2021-04-05 DIAGNOSIS — Z86711 Personal history of pulmonary embolism: Secondary | ICD-10-CM | POA: Diagnosis not present

## 2021-04-05 DIAGNOSIS — Z8249 Family history of ischemic heart disease and other diseases of the circulatory system: Secondary | ICD-10-CM | POA: Diagnosis not present

## 2021-04-05 DIAGNOSIS — J1282 Pneumonia due to coronavirus disease 2019: Secondary | ICD-10-CM | POA: Diagnosis present

## 2021-04-05 DIAGNOSIS — F05 Delirium due to known physiological condition: Secondary | ICD-10-CM | POA: Diagnosis present

## 2021-04-05 DIAGNOSIS — D72828 Other elevated white blood cell count: Secondary | ICD-10-CM | POA: Diagnosis present

## 2021-04-05 DIAGNOSIS — I480 Paroxysmal atrial fibrillation: Secondary | ICD-10-CM | POA: Diagnosis present

## 2021-04-05 DIAGNOSIS — Z8546 Personal history of malignant neoplasm of prostate: Secondary | ICD-10-CM | POA: Diagnosis not present

## 2021-04-05 DIAGNOSIS — G7000 Myasthenia gravis without (acute) exacerbation: Secondary | ICD-10-CM | POA: Diagnosis present

## 2021-04-05 DIAGNOSIS — Z86718 Personal history of other venous thrombosis and embolism: Secondary | ICD-10-CM | POA: Diagnosis not present

## 2021-04-05 LAB — RESP PANEL BY RT-PCR (FLU A&B, COVID) ARPGX2
Influenza A by PCR: NEGATIVE
Influenza B by PCR: NEGATIVE
SARS Coronavirus 2 by RT PCR: POSITIVE — AB

## 2021-04-05 LAB — TROPONIN I (HIGH SENSITIVITY)
Troponin I (High Sensitivity): 33 ng/L — ABNORMAL HIGH (ref <18)
Troponin I (High Sensitivity): 42 ng/L — ABNORMAL HIGH (ref <18)

## 2021-04-05 MED ORDER — APIXABAN 5 MG PO TABS
5.0000 mg | ORAL_TABLET | Freq: Two times a day (BID) | ORAL | Status: DC
  Administered 2021-04-05 – 2021-04-15 (×20): 5 mg via ORAL
  Filled 2021-04-05 (×20): qty 1

## 2021-04-05 MED ORDER — PANTOPRAZOLE SODIUM 40 MG PO TBEC
40.0000 mg | DELAYED_RELEASE_TABLET | Freq: Every day | ORAL | Status: DC
  Administered 2021-04-05 – 2021-04-15 (×11): 40 mg via ORAL
  Filled 2021-04-05 (×11): qty 1

## 2021-04-05 MED ORDER — DONEPEZIL HCL 10 MG PO TABS
10.0000 mg | ORAL_TABLET | Freq: Every day | ORAL | Status: DC
  Administered 2021-04-06 – 2021-04-14 (×10): 10 mg via ORAL
  Filled 2021-04-05 (×11): qty 1

## 2021-04-05 MED ORDER — AZITHROMYCIN 250 MG PO TABS: 500.0000 mg | ORAL_TABLET | Freq: Every day | ORAL | Status: DC

## 2021-04-05 MED ORDER — PREDNISONE 5 MG PO TABS
15.0000 mg | ORAL_TABLET | Freq: Every day | ORAL | Status: DC
  Administered 2021-04-06 – 2021-04-11 (×6): 15 mg via ORAL
  Filled 2021-04-05: qty 3
  Filled 2021-04-05: qty 1
  Filled 2021-04-05 (×2): qty 3
  Filled 2021-04-05: qty 1
  Filled 2021-04-05 (×2): qty 3

## 2021-04-05 MED ORDER — MEMANTINE HCL 10 MG PO TABS
20.0000 mg | ORAL_TABLET | Freq: Every day | ORAL | Status: DC
  Administered 2021-04-06 – 2021-04-14 (×10): 20 mg via ORAL
  Filled 2021-04-05 (×12): qty 2

## 2021-04-05 MED ORDER — PYRIDOSTIGMINE BROMIDE 60 MG PO TABS
60.0000 mg | ORAL_TABLET | Freq: Two times a day (BID) | ORAL | Status: DC
  Administered 2021-04-05 – 2021-04-15 (×20): 60 mg via ORAL
  Filled 2021-04-05 (×20): qty 1

## 2021-04-05 MED ORDER — LATANOPROST 0.005 % OP SOLN
1.0000 [drp] | Freq: Every day | OPHTHALMIC | Status: DC
  Administered 2021-04-06 – 2021-04-14 (×6): 1 [drp] via OPHTHALMIC
  Filled 2021-04-05: qty 2.5

## 2021-04-05 MED ORDER — IPRATROPIUM-ALBUTEROL 0.5-2.5 (3) MG/3ML IN SOLN
3.0000 mL | Freq: Four times a day (QID) | RESPIRATORY_TRACT | Status: DC
  Administered 2021-04-05 – 2021-04-07 (×4): 3 mL via RESPIRATORY_TRACT
  Filled 2021-04-05 (×6): qty 3

## 2021-04-05 MED ORDER — SENNOSIDES-DOCUSATE SODIUM 8.6-50 MG PO TABS: 1.0000 | ORAL_TABLET | Freq: Every evening | ORAL | Status: DC | PRN

## 2021-04-05 MED ORDER — ACETAMINOPHEN 325 MG PO TABS
325.0000 mg | ORAL_TABLET | Freq: Four times a day (QID) | ORAL | Status: DC | PRN
  Administered 2021-04-07 – 2021-04-09 (×3): 325 mg via ORAL
  Filled 2021-04-05 (×2): qty 1

## 2021-04-05 MED ORDER — GUAIFENESIN ER 600 MG PO TB12
1200.0000 mg | ORAL_TABLET | Freq: Two times a day (BID) | ORAL | Status: DC
  Administered 2021-04-05 – 2021-04-06 (×3): 1200 mg via ORAL
  Filled 2021-04-05 (×3): qty 2

## 2021-04-05 MED ORDER — ALBUTEROL SULFATE HFA 108 (90 BASE) MCG/ACT IN AERS
1.0000 | INHALATION_SPRAY | Freq: Four times a day (QID) | RESPIRATORY_TRACT | Status: DC | PRN
  Filled 2021-04-05: qty 6.7

## 2021-04-05 MED ORDER — DOXYCYCLINE HYCLATE 100 MG PO TABS
100.0000 mg | ORAL_TABLET | Freq: Two times a day (BID) | ORAL | Status: DC
  Administered 2021-04-05 – 2021-04-10 (×11): 100 mg via ORAL
  Filled 2021-04-05 (×11): qty 1

## 2021-04-05 MED ORDER — SODIUM CHLORIDE 0.9 % IV SOLN
2.0000 g | INTRAVENOUS | Status: AC
  Administered 2021-04-05 – 2021-04-08 (×4): 2 g via INTRAVENOUS
  Filled 2021-04-05 (×4): qty 20

## 2021-04-05 MED ORDER — SODIUM CHLORIDE 0.9 % IV SOLN: INTRAVENOUS | Status: AC

## 2021-04-05 MED ORDER — ENSURE ENLIVE PO LIQD
237.0000 mL | Freq: Two times a day (BID) | ORAL | Status: DC
Start: 2021-04-05 — End: 2021-04-15
  Administered 2021-04-05 – 2021-04-13 (×10): 237 mL via ORAL
  Filled 2021-04-05: qty 237

## 2021-04-05 NOTE — Progress Notes (Signed)
SLP Cancellation Note  Patient Details Name: Elijah Roback Sr. MRN: [id number] DOB: 02/08/1935   Cancelled treatment:       Reason Eval/Treat Not Completed: Patient declined, no reason specified;Other (comment) (Patient pleasantly declined PO's. Of note, patient was seen by SLP on 12/22 at [facility] for bedside swallow eval and oropharyngeal swallow was [facility] at that time. SLP will f/u next date.)   Sonia Baller, MA, CCC-SLP Speech Therapy

## 2021-04-05 NOTE — ED Notes (Signed)
Pt cleaned some brown stool in his diaper bed changed and warm blankets placed..  the pt is alert to name knows he lives in [place] but cannot tell me why he s here   no family present he is asking about his wife.  Not here at 1500 when I came on  iv rt hand pulled out  bruiese all over his body  redness and irritation rectal area and scrotum

## 2021-04-05 NOTE — ED Notes (Signed)
Lunch tray ordered,

## 2021-04-05 NOTE — ED Notes (Signed)
Loop recorder interrogated

## 2021-04-05 NOTE — ED Notes (Signed)
O2 stays between 88-92% on RA. O2 at 2LPM initiated for comfort.

## 2021-04-05 NOTE — H&P (Signed)
History and Physical    Elijah Kreitzer Sr. HUT:[id number] DOB: Nov 30, 1934 DOA: 04/04/2021  PCP: Olin Hauser, DO (Confirm with patient/family/NH records and if not entered, this has to be entered at [facility] point of entry) Patient coming from: Home  I have personally briefly reviewed patient's old medical records in [place]  Chief Complaint: Cough, fever, light headed.  HPI: Elijah Littles Sr. is a 86 y.o. 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  Patient was treated for COVID-19 pneumonia and discharged home on 12/23.  Family reported that the patient however continues to have dry cough, subjective fever and generalized weakness.  And patient continue to experience lightheadedness when standing up but no loss of consciousness or fall.  Denied any diarrhea, no urinary problems no chest pain or abdominal pain.  ED Course: Low-grade fever 99, no tachycardia, blood pressure borderline low.  Chest x-ray suspicious for left lower lobe infiltrates for pneumonia.  WBC 12.6, creatinine 1.7  Review of Systems: As per HPI otherwise 14 point review of systems negative.    Past Medical History:  Diagnosis Date   Dyslipidemia    GERD (gastroesophageal reflux disease)    Glaucoma    History of stress test    a. 08/2020 ETT: Max HR 125 (91%). 4.6 METS. ECG w/ isolated PVCs, intermittent Mobitz I & II.   Hyperlipidemia    Hypertension    Insomnia    Mobitz type 1 second degree atrioventricular block    Mobitz type II atrioventricular block    Myasthenia gravis ([place]) 09/08/2012   Obesity    Ocular myasthenia gravis ([place])    Pre-syncope    a. 08/2020 [place]: NSVT, Mobitz I, 2 episodes of high degree AVB during sleep.   Prostate  cancer [facility])     Past Surgical History:  Procedure Laterality Date   CATARACT EXTRACTION Bilateral    CHOLECYSTECTOMY     TRANSURETHRAL RESECTION OF PROSTATE       reports that he quit smoking about 51 years ago. His smoking use included cigarettes. He has quit using smokeless tobacco. He reports current alcohol use. He reports that he does not use drugs.  Allergies  Allergen Reactions   Pravastatin Other (See Comments)   Simvastatin Other (See Comments)    Family History  Problem Relation Age of Onset   Heart attack Father      Prior to Admission medications   Medication Sig Start Date End Date Taking? Authorizing Provider  acetaminophen (TYLENOL) 325 MG tablet Take 1 tablet (325 mg total) by mouth every 6 (six) hours as needed for mild pain (or Fever >/= 101). 01/10/17   Nicholes Mango, MD  apixaban (ELIQUIS) 5 MG TABS tablet Take 1 tablet (5 mg total) by mouth 2 (two) times daily. 02/24/17   Karamalegos, Devonne Doughty, DO  donepezil (ARICEPT) 10 MG tablet Take 10 mg by mouth at bedtime.    [provider]  feeding supplement, ENSURE ENLIVE, (ENSURE ENLIVE) LIQD Take 237 mLs by mouth 2 (two) times daily between meals. 01/10/17   Gouru, Illene Silver, MD  latanoprost (XALATAN) 0.005 % ophthalmic solution Place 1 drop into both eyes at bedtime.    [provider]  memantine (NAMENDA) 10 MG tablet Take 20 mg by mouth at bedtime.    [provider]  omeprazole (PRILOSEC) 40 MG capsule Take 1 capsule (40 mg total) by mouth 2 (two) times daily before a meal. 07/24/19   Karamalegos, Devonne Doughty, DO  predniSONE (DELTASONE) 1 MG tablet Take 2 mg by mouth daily with breakfast. (Take with 5mg  tablet to equal 7mg  total)    [provider]  predniSONE (DELTASONE) 5 MG tablet Take 5 mg by mouth daily. (Take with 2mg  tablets to equal 7mg  total)    [provider]  pyridostigmine (MESTINON) 60 MG tablet Take 60 mg by mouth 2 (two) times daily.    [provider]  senna-docusate (SENOKOT-S) 8.6-50 MG tablet Take 1 tablet by mouth at bedtime as needed for mild constipation. 01/10/17   Nicholes Mango, MD  Vitamins/Minerals TABS Take 1 tablet by mouth daily.    [provider]    Physical Exam: Vitals:   04/05/21 0900 04/05/21 0945 04/05/21 1151 04/05/21 1200  BP: (!) 113/59 102/73 104/71 101/64  Pulse: (!) 101 81 88 82  Resp: (!) 26 (!) 29 (!) 33 (!) 28  Temp:      TempSrc:      SpO2: 93% 90% 92% 95%    Constitutional: NAD, calm, comfortable Vitals:   04/05/21 0900 04/05/21 0945 04/05/21 1151 04/05/21 1200  BP: (!) 113/59 102/73 104/71 101/64  Pulse: (!) 101 81 88 82  Resp: (!) 26 (!) 29 (!) 33 (!) 28  Temp:      TempSrc:      SpO2: 93% 90% 92% 95%   Eyes: PERRL, lids and conjunctivae normal ENMT: Mucous membranes are dry. Posterior pharynx clear of any exudate or lesions.Normal dentition.  Neck: normal, supple, no masses, no thyromegaly Respiratory: clear to auscultation bilaterally, no wheezing, fine crackles on left lower field. Increasing respiratory effort. No accessory muscle use.  Cardiovascular: Regular rate and rhythm, no murmurs / rubs / gallops. No extremity edema. 2+ pedal pulses. No carotid bruits.  Abdomen: no tenderness, no masses palpated. No hepatosplenomegaly. Bowel sounds positive.  Musculoskeletal: no clubbing / cyanosis. No joint deformity upper and lower extremities. Good ROM, no contractures. Normal muscle tone.  Skin: no rashes, lesions, ulcers. No induration Neurologic: CN 2-12 grossly intact. Sensation intact, DTR normal. Strength 5/5 in all 4.  Psychiatric: Normal judgment and insight. Alert and oriented x 3. Normal mood.     Labs on Admission: I have personally reviewed following labs and imaging studies  CBC: Recent Labs  Lab 04/04/21 2254  WBC 12.6*  HGB 14.1  HCT 41.8  MCV 95.0  PLT 119   Basic Metabolic Panel: Recent Labs  Lab 04/04/21 2254  NA 135  K 4.0  CL 104  CO2  23  GLUCOSE 125*  BUN 24*  CREATININE 1.70*  CALCIUM 8.2*   GFR: Estimated Creatinine Clearance: 35.7 mL/min (A) (by C-G formula based on SCr of 1.7 mg/dL (H)). Liver Function Tests: Recent Labs  Lab 04/04/21 2254  AST 29  ALT 53*  ALKPHOS 52  BILITOT 1.0  PROT 6.0*  ALBUMIN 2.7*   No results for input(s): LIPASE, AMYLASE in the last 168 hours. No results for input(s): AMMONIA in the last 168 hours. Coagulation Profile: No results for input(s): INR, PROTIME in the last 168 hours. Cardiac Enzymes: No results for input(s): CKTOTAL, CKMB, CKMBINDEX, TROPONINI in the last 168 hours. BNP (last 3 results) No results for input(s): PROBNP in the last 8760 hours. HbA1C: No results for input(s): HGBA1C in the last 72 hours. CBG: No results for input(s):  GLUCAP in the last 168 hours. Lipid Profile: No results for input(s): CHOL, HDL, LDLCALC, TRIG, CHOLHDL, LDLDIRECT in the last 72 hours. Thyroid Function Tests: No results for input(s): TSH, T4TOTAL, FREET4, T3FREE, THYROIDAB in the last 72 hours. Anemia Panel: No results for input(s): VITAMINB12, FOLATE, FERRITIN, TIBC, IRON, RETICCTPCT in the last 72 hours. Urine analysis:    Component Value Date/Time   COLORURINE AMBER (A) 03/22/2021 1954   APPEARANCEUR HAZY (A) 03/22/2021 1954   APPEARANCEUR Clear 01/07/2014 1435   LABSPEC 1.024 03/22/2021 1954   LABSPEC 1.020 01/07/2014 1435   PHURINE 5.0 03/22/2021 1954   GLUCOSEU NEGATIVE 03/22/2021 1954   GLUCOSEU Negative 01/07/2014 1435   HGBUR LARGE (A) 03/22/2021 1954   BILIRUBINUR NEGATIVE 03/22/2021 1954   BILIRUBINUR Negative 01/07/2014 1435   KETONESUR 20 (A) 03/22/2021 1954   PROTEINUR 100 (A) 03/22/2021 1954   NITRITE NEGATIVE 03/22/2021 1954   LEUKOCYTESUR NEGATIVE 03/22/2021 1954   LEUKOCYTESUR Negative 01/07/2014 1435    Radiological Exams on Admission: CT Head Wo Contrast  Result Date: 04/05/2021 CLINICAL DATA:  Mental status change. EXAM: CT HEAD WITHOUT  CONTRAST TECHNIQUE: Contiguous axial images were obtained from the base of the skull through the vertex without intravenous contrast. COMPARISON:  03/26/2021 FINDINGS: Brain: No evidence of acute infarction, hemorrhage, hydrocephalus, extra-axial collection or mass lesion/mass effect. There is marked diffuse low-attenuation within the subcortical and periventricular white matter compatible with chronic microvascular disease. Prominence of the sulci and ventricles compatible with brain atrophy. Vascular: No hyperdense vessel or unexpected calcification. Skull: Normal. Negative for fracture or focal lesion. Sinuses/Orbits: No acute abnormality. Mucosal thickening is again noted involving the maxillary sinuses, ethmoid air cells, frontal sinus and sphenoid sinus. Mastoid air cells are clear. Orbits are unremarkable. Other: None IMPRESSION: 1. No acute intracranial abnormalities. 2. Chronic small vessel ischemic disease and brain atrophy. 3. Chronic sinus inflammation. Electronically Signed   By: Kerby Moors M.D.   On: 04/05/2021 06:16   DG Chest Portable 1 View  Result Date: 04/05/2021 CLINICAL DATA:  Weakness.  COVID positive.  Shortness of breath. EXAM: PORTABLE CHEST 1 VIEW COMPARISON:  03/24/2021 and 03/22/2021. FINDINGS: Again noted are patchy densities in the right upper lung which have minimally changed. Patchy densities in left lower chest have slightly progressed and could represent acute on chronic disease. Heart is prominent for size but there are also low lung volumes. Again noted is a cardiac recorder in the left anterior chest. Negative for pneumothorax. IMPRESSION: Bilateral patchy lung densities particularly in the left lower lung. Left lower lung densities appear to have a chronic component but cannot exclude acute on chronic disease. Findings are concerning for multifocal pneumonia. Electronically Signed   By: Markus Daft M.D.   On: 04/05/2021 10:44    EKG: Independently reviewed.  A. fib,  rate controlled, no acute ST changes.  Assessment/Plan Principal Problem:   CAP (community acquired pneumonia) Active Problems:   Community acquired pneumonia  (please populate well all problems here in Problem List. (For example, if patient is on BP meds at home and you resume or decide to hold them, it is a problem that needs to be her. Same for CAD, COPD, HLD and so on)  Postviral bacterial pneumonia left lower lung, CURB65=2 -Empirical treatment with ceftriaxone and doxycycline, recommended by pharmacist due to patient history of myasthenia gravis -Bronchodilators and incentive spirometry -Culture sputum, atypical pneumonia study. -Other DDx, according to wife, patient has no history of aspiration, no cough or choke after eating  or drinking, low suspicion for aspiration pneumonia.  Patient has a normal echocardiogram this month, thus low suspicion for CHF.  And COVID was treated with 5 days of Paxlovid.  Deconditioning/generalized weakness -PT evaluation  Dehydration -We will give short course of 12 hours IV fluid and reevaluate.  Near syncope with baseline secondary to AV block and PAF, -Suspect from dehydration and volume depletion -Duration as above, check orthostatic vital signs -Discussed with on-call cardiology Dr. Debara Pickett, who will contact EP physician to interrogate the loop regular as early as today.   Myasthenia gravis -he has generalized weakness, but muscle strength is preserved, denied any lid lagging or trouble taking deep breath, low suspicion for flareup.  Continue pyridostigmine -We will double dose of his baseline prednisone for his pneumonia.  PAF -Rate controlled -Continue Eliquis  CKD stage II -Symptoms signs of hypovolemia, short course IV fluid as above.  Advanced dementia -Stable.    DVT prophylaxis: Eliquis Code Status: Full code Family Communication: Wife at bedside Disposition Plan: Elderly patient came with postviral pneumonia, generalized  weakness, expect more than 2 midnight hospital stay, PT evaluation expect discharge to SNF. Consults called: Cardiology Admission status: MedSurg admission   Lequita Halt MD [facility] Pager [phone]  04/05/2021, 12:57 PM

## 2021-04-05 NOTE — ED Notes (Signed)
Repeat EKG given to Dr.Pfeiffer.

## 2021-04-05 NOTE — ED Notes (Signed)
Spouse with pt. Pt moved to lobby pending room assignment

## 2021-04-05 NOTE — ED Provider Notes (Signed)
[facility] EMERGENCY DEPARTMENT Provider Note   CSN: [id number] Arrival date & time: 04/04/21  2137     History Chief Complaint  Patient presents with   Urinary Tract Infection    Elijah Layson Sr. is a 86 y.o. male.  86 year old male with past medical history of myasthenia gravis, GERD, dementia, recent hospitalization for syncope presents today for evaluation of generalized weakness and complaint of shortness of breath.  Wife is at bedside reports since patient was discharge from the hospital on 12/17 he has progressively worsening weakness to the point he is unable to perform his activities of daily living.  He also endorses shortness of breath, but denies chest pain, palpitations, abdominal pain, lightheadedness, or dysuria.  Of note he is on Eliquis for history of PE and DVT.  Reports compliance.  The history is provided by the patient and the spouse. No language interpreter was used.      Past Medical History:  Diagnosis Date   Dyslipidemia    GERD (gastroesophageal reflux disease)    Glaucoma    History of stress test    a. 08/2020 ETT: Max HR 125 (91%). 4.6 METS. ECG w/ isolated PVCs, intermittent Mobitz I & II.   Hyperlipidemia    Hypertension    Insomnia    Mobitz type 1 second degree atrioventricular block    Mobitz type II atrioventricular block    Myasthenia gravis ([place]) 09/08/2012   Obesity    Ocular myasthenia gravis ([place])    Pre-syncope    a. 08/2020 Zio: NSVT, Mobitz I, 2 episodes of high degree AVB during sleep.   Prostate cancer [facility])     Patient Active Problem List   Diagnosis Date Noted   Stage 3a chronic kidney disease (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    Dementia without behavioral disturbance, psychotic disturbance, mood disturbance, or anxiety ([place])    Pneumonia due to COVID-19 virus    Syncope and collapse 03/31/2018   Left-sided headache 01/25/2017    OSA (obstructive sleep apnea) 12/24/2016   Diarrhea 12/24/2016   Squamous cell carcinoma 09/07/2016   Cavitating mass of lung 04/17/2016   Gastritis 04/17/2016   Recurrent pulmonary emboli ([place]) 04/15/2016   Essential hypertension 04/14/2016   HLD (hyperlipidemia) 04/14/2016   GERD (gastroesophageal reflux disease) 04/14/2016   Hypokalemia 04/14/2016   Weakness 04/14/2016   Postcholecystectomy diarrhea 09/14/2013   Dizziness and giddiness 08/02/2013   Chronic cholecystitis 01/25/2013   Myasthenia gravis ([place]) 09/08/2012    Past Surgical History:  Procedure Laterality Date   CATARACT EXTRACTION Bilateral    CHOLECYSTECTOMY     TRANSURETHRAL RESECTION OF PROSTATE         Family History  Problem Relation Age of Onset   Heart attack Father     Social History   Tobacco Use   Smoking status: Former    Types: Cigarettes    Quit date: 04/06/1970    Years since quitting: 51.0   Smokeless tobacco: Former  Scientific laboratory technician Use: Never used  Substance Use Topics   Alcohol use: Yes    Alcohol/week: 0.0 standard drinks    Comment: Consumes alcohol on occasion   Drug use: No    Home Medications Prior to Admission medications   Medication Sig Start Date End Date Taking? Authorizing Provider  acetaminophen (TYLENOL) 325 MG tablet Take 1 tablet (325 mg total) by mouth every 6 (six)  hours as needed for mild pain (or Fever >/= 101). 01/10/17   Nicholes Mango, MD  apixaban (ELIQUIS) 5 MG TABS tablet Take 1 tablet (5 mg total) by mouth 2 (two) times daily. 02/24/17   Karamalegos, Devonne Doughty, DO  donepezil (ARICEPT) 10 MG tablet Take 10 mg by mouth at bedtime.    [provider]  feeding supplement, ENSURE ENLIVE, (ENSURE ENLIVE) LIQD Take 237 mLs by mouth 2 (two) times daily between meals. 01/10/17   Gouru, Illene Silver, MD  latanoprost (XALATAN) 0.005 % ophthalmic solution Place 1 drop into both eyes at bedtime.    [provider]  memantine (NAMENDA) 10 MG tablet Take 20  mg by mouth at bedtime.    [provider]  omeprazole (PRILOSEC) 40 MG capsule Take 1 capsule (40 mg total) by mouth 2 (two) times daily before a meal. 07/24/19   Karamalegos, Devonne Doughty, DO  predniSONE (DELTASONE) 1 MG tablet Take 2 mg by mouth daily with breakfast. (Take with 5mg  tablet to equal 7mg  total)    [provider]  predniSONE (DELTASONE) 5 MG tablet Take 5 mg by mouth daily. (Take with 2mg  tablets to equal 7mg  total)    [provider]  pyridostigmine (MESTINON) 60 MG tablet Take 60 mg by mouth 2 (two) times daily.    [provider]  senna-docusate (SENOKOT-S) 8.6-50 MG tablet Take 1 tablet by mouth at bedtime as needed for mild constipation. 01/10/17   Nicholes Mango, MD  Vitamins/Minerals TABS Take 1 tablet by mouth daily.    [provider]    Allergies    Pravastatin and Simvastatin  Review of Systems   Review of Systems  Constitutional:  Negative for activity change, chills and fever.  Respiratory:  Positive for shortness of breath.   Cardiovascular:  Negative for chest pain and palpitations.  Gastrointestinal:  Negative for abdominal pain, diarrhea, nausea and vomiting.  Genitourinary:  Negative for dysuria.  Neurological:  Positive for weakness. Negative for light-headedness.  All other systems reviewed and are negative.  Physical Exam Updated Vital Signs BP (!) 113/59    Pulse (!) 101    Temp 99 F (37.2 C) (Oral)    Resp (!) 26    SpO2 93%   Physical Exam Vitals and nursing note reviewed.  Constitutional:      General: He is not in acute distress.    Appearance: Normal appearance. He is not ill-appearing.  HENT:     Head: Normocephalic and atraumatic.     Nose: Nose normal.  Eyes:     General: No scleral icterus.    Extraocular Movements: Extraocular movements intact.     Conjunctiva/sclera: Conjunctivae normal.  Cardiovascular:     Rate and Rhythm: Tachycardia present. Rhythm irregular.     Pulses: Normal  pulses.     Heart sounds: Normal heart sounds.  Pulmonary:     Effort: Pulmonary effort is normal. No respiratory distress.     Breath sounds: Normal breath sounds. No wheezing or rales.  Abdominal:     General: There is no distension.     Tenderness: There is no abdominal tenderness.  Musculoskeletal:        General: Normal range of motion.     Cervical back: Normal range of motion.     Right lower leg: No edema.     Left lower leg: No edema.  Skin:    General: Skin is warm and dry.  Neurological:     General: No focal  deficit present.     Mental Status: He is alert. Mental status is at baseline.    ED Results / Procedures / Treatments   Labs (all labs ordered are listed, but only abnormal results are displayed) Labs Reviewed  RESP PANEL BY RT-PCR (FLU A&B, COVID) ARPGX2 - Abnormal; Notable for the following components:      Result Value   SARS Coronavirus 2 by RT PCR POSITIVE (*)    All other components within normal limits  COMPREHENSIVE METABOLIC PANEL - Abnormal; Notable for the following components:   Glucose, Bld 125 (*)    BUN 24 (*)    Creatinine, Ser 1.70 (*)    Calcium 8.2 (*)    Total Protein 6.0 (*)    Albumin 2.7 (*)    ALT 53 (*)    GFR, Estimated 39 (*)    All other components within normal limits  CBC - Abnormal; Notable for the following components:   WBC 12.6 (*)    All other components within normal limits  TROPONIN I (HIGH SENSITIVITY) - Abnormal; Notable for the following components:   Troponin I (High Sensitivity) 33 (*)    All other components within normal limits  TROPONIN I (HIGH SENSITIVITY) - Abnormal; Notable for the following components:   Troponin I (High Sensitivity) 42 (*)    All other components within normal limits  URINALYSIS, ROUTINE W REFLEX MICROSCOPIC    EKG None  Radiology CT Head Wo Contrast  Result Date: 04/05/2021 CLINICAL DATA:  Mental status change. EXAM: CT HEAD WITHOUT CONTRAST TECHNIQUE: Contiguous axial images  were obtained from the base of the skull through the vertex without intravenous contrast. COMPARISON:  03/26/2021 FINDINGS: Brain: No evidence of acute infarction, hemorrhage, hydrocephalus, extra-axial collection or mass lesion/mass effect. There is marked diffuse low-attenuation within the subcortical and periventricular white matter compatible with chronic microvascular disease. Prominence of the sulci and ventricles compatible with brain atrophy. Vascular: No hyperdense vessel or unexpected calcification. Skull: Normal. Negative for fracture or focal lesion. Sinuses/Orbits: No acute abnormality. Mucosal thickening is again noted involving the maxillary sinuses, ethmoid air cells, frontal sinus and sphenoid sinus. Mastoid air cells are clear. Orbits are unremarkable. Other: None IMPRESSION: 1. No acute intracranial abnormalities. 2. Chronic small vessel ischemic disease and brain atrophy. 3. Chronic sinus inflammation. Electronically Signed   By: Kerby Moors M.D.   On: 04/05/2021 06:16    Procedures Procedures   Medications Ordered in ED Medications - No data to display  ED Course  I have reviewed the triage vital signs and the nursing notes.  Pertinent labs & imaging results that were available during my care of the patient were reviewed by me and considered in my medical decision making (see chart for details).    MDM Rules/Calculators/A&P                         86 year old male recently discharged from [facility] regional hospital on 12/17 presents today for progressively worsening weakness and shortness of breath.  Patient found to be in A. fib rate controlled here in the emergency room.  Work-up significant for respiratory panel positive for COVID and chest x-ray concerning for multifocal pneumonia.  CBC with mild leukocytosis of 12.6, creatinine of 1.7 mildly up from his baseline which is around 1.5, mildly elevated troponin which is significantly improved from recent hospitalization this  is likely demand.  Doubt myocarditis given patient is without any chest pain.  Patient also desatted  to 87% on room air while at rest and was placed on 2 L nasal cannula.  Case discussed with hospitalist who will evaluate patient for admission.     Final Clinical Impression(s) / ED Diagnoses Final diagnoses:  COVID-19  Pneumonia due to COVID-19 virus    Rx / DC Orders ED Discharge Orders     None        Evlyn Courier, PA-C 04/05/21 1343    Charlesetta Shanks, MD 04/09/21 [id number]

## 2021-04-06 DIAGNOSIS — U071 COVID-19: Principal | ICD-10-CM

## 2021-04-06 DIAGNOSIS — J189 Pneumonia, unspecified organism: Secondary | ICD-10-CM

## 2021-04-06 LAB — BASIC METABOLIC PANEL
Anion gap: 11 (ref 5–15)
BUN: 27 mg/dL — ABNORMAL HIGH (ref 8–23)
CO2: 18 mmol/L — ABNORMAL LOW (ref 22–32)
Calcium: 8.1 mg/dL — ABNORMAL LOW (ref 8.9–10.3)
Chloride: 105 mmol/L (ref 98–111)
Creatinine, Ser: 1.56 mg/dL — ABNORMAL HIGH (ref 0.61–1.24)
GFR, Estimated: 44 mL/min — ABNORMAL LOW (ref >60)
Glucose, Bld: 102 mg/dL — ABNORMAL HIGH (ref 70–99)
Potassium: 4 mmol/L (ref 3.5–5.1)
Sodium: 134 mmol/L — ABNORMAL LOW (ref 135–145)

## 2021-04-06 LAB — CBC
HCT: 43.8 % (ref 39.0–52.0)
Hemoglobin: 14.4 g/dL (ref 13.0–17.0)
MCH: 31.6 pg (ref 26.0–34.0)
MCHC: 32.9 g/dL (ref 30.0–36.0)
MCV: 96.1 fL (ref 80.0–100.0)
Platelets: 219 10*3/uL (ref 150–400)
RBC: 4.56 MIL/uL (ref 4.22–5.81)
RDW: 13.2 % (ref 11.5–15.5)
WBC: 11.6 10*3/uL — ABNORMAL HIGH (ref 4.0–10.5)
nRBC: 0 % (ref 0.0–0.2)

## 2021-04-06 LAB — SAR COV2 SEROLOGY (COVID19)AB(IGG),IA: SARS-CoV-2 Ab, IgG: NONREACTIVE

## 2021-04-06 LAB — HEMOGLOBIN A1C
Hgb A1c MFr Bld: 5.9 % — ABNORMAL HIGH (ref 4.8–5.6)
Mean Plasma Glucose: 122.63 mg/dL

## 2021-04-06 MED ORDER — GUAIFENESIN ER 600 MG PO TB12
600.0000 mg | ORAL_TABLET | Freq: Two times a day (BID) | ORAL | Status: DC
  Administered 2021-04-06 – 2021-04-15 (×18): 600 mg via ORAL
  Filled 2021-04-06 (×18): qty 1

## 2021-04-06 MED ORDER — HYDROCOD POLST-CPM POLST ER 10-8 MG/5ML PO SUER
5.0000 mL | Freq: Two times a day (BID) | ORAL | Status: DC | PRN
  Administered 2021-04-07 – 2021-04-08 (×2): 5 mL via ORAL
  Filled 2021-04-06 (×2): qty 5

## 2021-04-06 MED ORDER — GUAIFENESIN-DM 100-10 MG/5ML PO SYRP
5.0000 mL | ORAL_SOLUTION | ORAL | Status: DC | PRN
  Administered 2021-04-07: 5 mL via ORAL
  Filled 2021-04-06: qty 5

## 2021-04-06 NOTE — Evaluation (Signed)
Occupational Therapy Evaluation Patient Details Name: Elijah Petersen Sr. MRN: [id number] DOB: 11-23-1934 Today's Date: 04/06/2021   History of Present Illness 87 year old male who mpresented 12/30 with generalized weakness and complaint of shortness of breath, worsening since d/c on 12/17. Pt with past medical history of myasthenia gravis, GERD, dementia, recent hospitalization for syncope   Clinical Impression   Rakeen reports being indep PTA his recent December admission, however since d/c he has been going "downhill," per his wife. He was initially mod I and eventually required max A for simple transfers, and not able to get OOB or care for himself. Upon evaluation pt was agitated and disoriented, but willing to participate with max encouragement. Overall he required max A+2 to get to sitting, he transferred with min A +2 and was able to return to supine with min A+2. He completed pivotal steps at the bedside with RW and min A, and declined further mobility. He currently requires up to mod A +2 for ADLs. He will benefit from continued OT acutely. Pt and pt's wife refusing SNF at this time, pt will benefit from max [facility] services at d/c to reduce caregiver burden.      Recommendations for follow up therapy are one component of a multi-disciplinary discharge planning process, led by the attending physician.  Recommendations may be updated based on patient status, additional functional criteria and insurance authorization.   Follow Up Recommendations  Home health OT (Pt will benefit from max [facility] services to reduce caregiver burden.)    Assistance Recommended at Discharge Frequent or constant Supervision/Assistance  Functional Status Assessment  Patient has had a recent decline in their functional status and demonstrates the ability to make significant improvements in function in a reasonable and predictable amount of time.  Equipment Recommendations  Wheelchair (measurements OT)       Precautions /  Restrictions Precautions Precautions: Fall Restrictions Weight Bearing Restrictions: No      Mobility Bed Mobility Overal bed mobility: Needs Assistance Bed Mobility: Supine to Sit;Sit to Supine     Supine to sit: Max assist;+2 for physical assistance;+2 for safety/equipment Sit to supine: Min assist;+2 for physical assistance;+2 for safety/equipment;HOB elevated        Transfers Overall transfer level: Needs assistance Equipment used: 2 person hand held assist Transfers: Sit to/from Stand Sit to Stand: Min assist;+2 physical assistance;+2 safety/equipment           General transfer comment: max encouragement to participate      Balance Overall balance assessment: Needs assistance Sitting-balance support: Feet supported Sitting balance-Leahy Scale: Fair     Standing balance support: Bilateral upper extremity supported;During functional activity Standing balance-Leahy Scale: Poor                             ADL either performed or assessed with clinical judgement   ADL Overall ADL's : Needs assistance/impaired Eating/Feeding: Set up;Sitting   Grooming: Set up;Sitting;Supervision/safety   Upper Body Bathing: Moderate assistance;Sitting   Lower Body Bathing: Moderate assistance;+2 for physical assistance;+2 for safety/equipment;Sit to/from stand   Upper Body Dressing : Minimal assistance;Sitting   Lower Body Dressing: Moderate assistance;+2 for physical assistance;Sit to/from stand   Toilet Transfer: Moderate assistance;+2 for physical assistance;+2 for safety/equipment;Stand-pivot Toilet Transfer Details (indicate cue type and reason): pt self limiting to SP only Toileting- Clothing Manipulation and Hygiene: Moderate assistance;+2 for physical assistance;+2 for safety/equipment;Sit to/from stand       Functional mobility during ADLs: Moderate assistance;Maximal  assistance;+2 for physical assistance;+2 for safety/equipment (max A +2 for bed  mobility, min A +2 onces standing) General ADL Comments: Pt required max encouragement to complete all OOB tasks. Declining anything more than pivotal steps. Pt with complaints of pain "all over"     Vision Baseline Vision/History: 0 No visual deficits Ability to See in Adequate Light: 0 Adequate Patient Visual Report: No change from baseline Vision Assessment?: No apparent visual deficits            Pertinent Vitals/Pain Pain Assessment: Faces Faces Pain Scale: Hurts little more Pain Location: "my body" - pt would not specify Pain Descriptors / Indicators: Discomfort;Grimacing Pain Intervention(s): Limited activity within patient's tolerance;Monitored during session     Hand Dominance Right   Extremity/Trunk Assessment Upper Extremity Assessment Upper Extremity Assessment: Generalized weakness (mildly limited overhead ROM, he is able to touch the back of his head with increased time.)   Lower Extremity Assessment Lower Extremity Assessment: Defer to PT evaluation   Cervical / Trunk Assessment Cervical / Trunk Assessment: Kyphotic   Communication Communication Communication: No difficulties   Cognition Arousal/Alertness: Awake/alert Behavior During Therapy: Agitated;Flat affect Overall Cognitive Status: History of cognitive impairments - at baseline                                 General Comments: dementia at baseline. Originally stating it is "2008," unable to state location or situation. agitated throughout session, declining to participate and required max encouragement to move. Pt declining SNF despite max A needed.     General Comments  VSS on RA, pt's wife present and supportive    Exercises     Shoulder Instructions      Home Living Family/patient expects to be discharged to:: Private residence Living Arrangements: Spouse/significant other Available Help at Discharge: Family Type of Home: House Home Access: Stairs to enter [employer] of Steps: 4 ste in the back, 3 ste in the back (b rails in the front, U in the back)   Home Layout: One level     Bathroom Shower/Tub: Teacher, early years/pre: Standard     Home Equipment: Conservation officer, nature (2 wheels);Cane - single point;Shower seat   Additional Comments: went home being able to walk around with a cane, "went downhill" to the point he could no longer stand up or try to walk at all before coming back to the hospital      Prior Functioning/Environment Prior Level of Function : Independent/Modified Independent             Mobility Comments: indep prior to recent admission; since d/c pt has went "downhill" according to his wife. He was unable to get up. 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        OT Problem List: Decreased strength;Decreased cognition;Decreased activity tolerance;Decreased safety awareness;Impaired balance (sitting and/or standing)      OT Treatment/Interventions: Self-care/ADL training;Therapeutic exercise;Therapeutic activities;Cognitive remediation/compensation;Energy conservation;DME and/or AE instruction;Patient/family education;Balance training;Manual therapy    OT Goals(Current goals can be found in the care plan section) Acute Rehab OT Goals Patient Stated Goal: to go home OT Goal Formulation: With patient Time For Goal Achievement: 04/20/21 Potential to Achieve Goals: Good ADL Goals Pt Will Perform Grooming: with supervision;standing Pt Will Perform Upper Body Dressing: with set-up;sitting Pt Will Perform Lower Body Dressing: with set-up;with supervision;sit to/from stand Pt Will Transfer to Toilet: with  supervision;ambulating  OT Frequency: Min 2X/week   Barriers to D/C:            Co-evaluation PT/OT/SLP Co-Evaluation/Treatment: Yes Reason for Co-Treatment: Complexity of the patient's impairments (multi-system involvement);Necessary to address cognition/behavior  during functional activity;For patient/therapist safety;To address functional/ADL transfers   OT goals addressed during session: ADL's and self-care;Proper use of Adaptive equipment and DME      AM-PAC OT "6 Clicks" Daily Activity     Outcome Measure Help from another person eating meals?: A Little Help from another person taking care of personal grooming?: A Little Help from another person toileting, which includes using toliet, bedpan, or urinal?: A Lot Help from another person bathing (including washing, rinsing, drying)?: A Lot Help from another person to put on and taking off regular upper body clothing?: A Little Help from another person to put on and taking off regular lower body clothing?: A Lot 6 Click Score: 15   End of Session Equipment Utilized During Treatment: Gait belt Nurse Communication: Mobility status  Activity Tolerance: Patient tolerated treatment well Patient left: in bed;with family/visitor present  OT Visit Diagnosis: Unsteadiness on feet (R26.81);Other abnormalities of gait and mobility (R26.89);Muscle weakness (generalized) (M62.81);Pain                Time: 3709-6438 OT Time Calculation (min): 24 min Charges:  OT General Charges $OT Visit: 1 Visit OT Evaluation $OT Eval Moderate Complexity: 1 Mod   Zyra Parrillo A Tashera Montalvo 04/06/2021, 12:50 PM

## 2021-04-06 NOTE — Evaluation (Signed)
Clinical/Bedside Swallow Evaluation Patient Details  Name: Elijah Ripberger Sr. MRN: [id number] Date of Birth: 02-27-1935  Today's Date: 04/06/2021 Time: SLP Start Time (ACUTE ONLY): 1345 SLP Stop Time (ACUTE ONLY): 1355 SLP Time Calculation (min) (ACUTE ONLY): 10 min  Past Medical History:  Past Medical History:  Diagnosis Date   Dyslipidemia    GERD (gastroesophageal reflux disease)    Glaucoma    History of stress test    a. 08/2020 ETT: Max HR 125 (91%). 4.6 METS. ECG w/ isolated PVCs, intermittent Mobitz I & II.   Hyperlipidemia    Hypertension    Insomnia    Mobitz type 1 second degree atrioventricular block    Mobitz type II atrioventricular block    Myasthenia gravis ([place]) 09/08/2012   Obesity    Ocular myasthenia gravis ([place])    Pre-syncope    a. 08/2020 Zio: NSVT, Mobitz I, 2 episodes of high degree AVB during sleep.   Prostate cancer [facility])    Past Surgical History:  Past Surgical History:  Procedure Laterality Date   CATARACT EXTRACTION Bilateral    CHOLECYSTECTOMY     TRANSURETHRAL RESECTION OF PROSTATE     HPI:  87yo male wh presented on 12/30 with persistent cough, fever, and weakness. Of note had recent hospitalization for Covid and DCed home 12/23. Now readmitted with CAP, postviral bacterial pneumonia in [facility]LL. PMH myasthenia gravis, advanced dementia, HTN, PAF, CHF, CKD, 2nd degree AV block on loop recorder, Covid, presyncope.    Assessment / Plan / Recommendation  Clinical Impression  Patient is not presenting with clinical s/s of dysphagia as per this bedside swallow evaluation. Of note, he was evaluated by SLP during previous admission on 12/22 and at that time, swallow function was reported to be [facility]. During today's evaluation, his wife was present right at beginning and she denied any recent or current swallowing difficulties. Patient also denied any difficulties but did state that his appetite has been poor. He consumed straw sips of thin liquids (water) via  straw sips and did not exhibit any overt s/s aspiration or penetration. He pleasantly declined any solids at this time. He did exhibit mild frequency and mild intensity of throat clearing which was consistent with his PMH of having GERD. SLP not recommending any changes in recommended PO's and not recommending further skilled SLP interventions. SLP Visit Diagnosis: Dysphagia, unspecified (R13.10)    Aspiration Risk  No limitations    Diet Recommendation Regular;Thin liquid   Liquid Administration via: Cup;Straw Medication Administration: Whole meds with liquid Supervision: Patient able to self feed Compensations: Minimize environmental distractions;Slow rate;Small sips/bites    Other  Recommendations Oral Care Recommendations: Oral care BID    Recommendations for follow up therapy are one component of a multi-disciplinary discharge planning process, led by the attending physician.  Recommendations may be updated based on patient status, additional functional criteria and insurance authorization.  Follow up Recommendations No SLP follow up      Assistance Recommended at Discharge None  Functional Status Assessment Patient has had a recent decline in their functional status and demonstrates the ability to make significant improvements in function in a reasonable and predictable amount of time.  Frequency and Duration            Prognosis   N/A     Swallow Study   General Date of Onset: 04/05/21 HPI: 87yo male wh presented on 12/30 with persistent cough, fever, and weakness. Of note had recent hospitalization for Covid and DCed home  12/23. Now readmitted with CAP, postviral bacterial pneumonia in [facility]. PMH myasthenia gravis, advanced dementia, HTN, PAF, CHF, CKD, 2nd degree AV block on loop recorder, Covid, presyncope. Type of Study: Bedside Swallow Evaluation Previous Swallow Assessment: during previous admission 12/22 Diet Prior to this Study: Regular;Thin liquids Temperature  Spikes Noted: No Respiratory Status: Room air Behavior/Cognition: Alert;Cooperative;Distractible Oral Cavity Assessment: Within Functional Limits Oral Care Completed by SLP: No Oral Cavity - Dentition: Adequate natural dentition Vision: Functional for self-feeding Self-Feeding Abilities: Able to feed self Patient Positioning: Upright in bed Baseline Vocal Quality: Normal Volitional Cough: Strong Volitional Swallow: Able to elicit    Oral/Motor/Sensory Function Overall Oral Motor/Sensory Function: Within functional limits   Ice Chips     Thin Liquid Thin Liquid: Within functional limits Presentation: Straw;Self Fed Other Comments: mild amount of throat clearing consistent with his premorbid diagnosis of GERD    Nectar Thick     Honey Thick     Puree Puree: Not tested   Solid     Solid: Not tested      Sonia Baller, MA, CCC-SLP Speech Therapy

## 2021-04-06 NOTE — Evaluation (Signed)
Physical Therapy Evaluation Patient Details Name: Elijah Jurney Sr. MRN: [id number] DOB: 02-13-1935 Today's Date: 04/06/2021  History of Present Illness  87yo male wh presented on 12/30 with persistent cough, fever, and weakness. Of note had recent hospitalization for Covid and DCed home 12/23. Now readmitted with CAP, postviral bacterial pneumonia in [facility]. 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 Needed Max encouragement at times to participate in low level tasks such as standing and side stepping. VSS on RA today, just very fatigued and generally not feeling well. Unable to attempt gait due to agitation. Left positioned to comfort on ED stretcher, spouse present. Might benefit from SNF, although he refuses this and spouse confirms he would be very unlikely to participate if sent to a rehab facility- best course of action here may be to plan for home with max HHPT services. Will continue efforts and try to progress as able.        Recommendations for follow up therapy are one component of a multi-disciplinary discharge planning process, led by the attending physician.  Recommendations may be updated based on patient status, additional functional criteria and insurance authorization.  Follow Up Recommendations Home health PT (needs max HHPT services for best outcomes)    Assistance Recommended at Discharge Frequent or constant Supervision/Assistance  Functional Status Assessment Patient has had a recent decline in their functional status and demonstrates the ability to make significant improvements in function in a reasonable and predictable amount of time.  Equipment Recommendations  Rolling walker (2 wheels);BSC/3in1;Wheelchair (measurements PT);Wheelchair cushion (measurements PT)    Recommendations for Other  Services       Precautions / Restrictions Precautions Precautions: Fall;Other (comment) Precaution Comments: myasthenia gravis Restrictions Weight Bearing Restrictions: No      Mobility  Bed Mobility Overal bed mobility: Needs Assistance Bed Mobility: Supine to Sit;Sit to Supine     Supine to sit: Max assist;+2 for physical assistance;+2 for safety/equipment Sit to supine: Min assist;+2 for physical assistance;+2 for safety/equipment;HOB elevated        Transfers Overall transfer level: Needs assistance Equipment used: 2 person hand held assist Transfers: Sit to/from Stand Sit to Stand: Min assist;+2 physical assistance;+2 safety/equipment           General transfer comment: max encouragement to participate    Ambulation/Gait               General Gait Details: refused to try walking, was able to take a couple of side steps with MinAx2  Stairs            Wheelchair Mobility    Modified Rankin (Stroke Patients Only)       Balance Overall balance assessment: Needs assistance Sitting-balance support: Feet supported Sitting balance-Leahy Scale: Fair Sitting balance - Comments: occasional loss of balance posteriorly that required cues to maintain midline sitting balance   Standing balance support: Bilateral upper extremity supported;During functional activity Standing balance-Leahy Scale: Poor                               Pertinent Vitals/Pain Pain Assessment: Faces Faces Pain Scale: Hurts little more Pain Location: "my body" - pt would not specify Pain Descriptors / Indicators: Discomfort;Grimacing Pain Intervention(s): Limited activity within patient's tolerance;Monitored during session    Home Living Family/patient expects to  be discharged to:: Private residence Living Arrangements: Spouse/significant other Available Help at Discharge: Family Type of Home: House Home Access: Stairs to enter   [employer] of  Steps: 4 ste in the back, 3 ste in the back (b rails in the front, U in the back)   Home Layout: One level [place]: Conservation officer, nature (2 wheels);Cane - single point;Shower seat Additional Comments: went home being able to walk around with a cane, "went downhill" to the point he could no longer stand up or try to walk at all before coming back to the hospital    Prior Function Prior Level of Function : Independent/Modified Independent             Mobility Comments: indep prior to recent admission; since d/c pt has went "downhill" according to his wife. He was unable to get up. 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 Originally stating it is "2008," unable to state location or situation. agitated throughout session, declining to participate and required max encouragement to move. Pt declining SNF despite max A needed.        General Comments General comments (skin integrity, edema, etc.): VSS on RA, wife present and supportive    Exercises     Assessment/Plan    PT Assessment Patient needs continued PT services  PT Problem List Decreased strength;Decreased activity tolerance;Decreased balance;Decreased mobility;Decreased safety awareness;Decreased cognition;Decreased knowledge of use of DME;Cardiopulmonary status limiting activity;Decreased  coordination       PT Treatment Interventions DME instruction;Gait training;Functional mobility training;Stair training;Therapeutic activities;Therapeutic exercise;Balance training;Neuromuscular re-education;Cognitive remediation;Patient/family education    PT Goals (Current goals can be found in the Care Plan section)  Acute Rehab PT Goals Patient Stated Goal: to return home PT Goal Formulation: With patient/family Time For Goal Achievement: 04/20/21 Potential to Achieve Goals: Fair    Frequency Min 3X/week   Barriers to discharge        Co-evaluation   Reason for Co-Treatment: Complexity of the patient's impairments (multi-system involvement);Necessary to address cognition/behavior during functional activity;For patient/therapist safety;To address functional/ADL transfers   OT goals addressed during session: ADL's and self-care;Proper use of Adaptive equipment and DME       AM-PAC PT "6 Clicks" Mobility  Outcome Measure Help needed turning from your back to your side while in a flat bed without using bedrails?: A Little Help needed moving from lying on your back to sitting on the side of a flat bed without using bedrails?: Total Help needed moving to and from a bed to a chair (including a wheelchair)?: A Lot Help needed standing up from a chair using your arms (e.g., wheelchair or bedside chair)?: A Lot Help needed to walk in hospital room?: Total Help needed climbing 3-5 steps with a railing? : Total 6 Click Score: 10  End of Session Equipment Utilized During Treatment: Gait belt Activity Tolerance: Patient tolerated treatment well Patient left: in bed;with call bell/phone within reach;with family/visitor present (ED stretcher) Nurse Communication: Mobility status PT Visit Diagnosis: Unsteadiness on feet (R26.81);Muscle weakness (generalized) (M62.81)    Time: 2575-0518 PT Time Calculation (min) (ACUTE ONLY): 19 min   Charges:   PT Evaluation $PT Eval Moderate  Complexity: 1 Mod         Donika Butner U PT, DPT, PN2   Supplemental Physical Therapist [place]    Pager [phone] Acute Rehab Office [phone]

## 2021-04-06 NOTE — ED Notes (Signed)
Large medium brown stool cleaned from pt and bed changed

## 2021-04-06 NOTE — Progress Notes (Signed)
PROGRESS NOTE    Elijah Henner Sr.  XHB:[id number] DOB: 1934/05/26 DOA: 04/04/2021 PCP: Olin Hauser, DO    Chief Complaint  Patient presents with   Urinary Tract Infection    Brief Narrative:  Elijah Ponder Sr. is a 87 y.o. 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   Patient was treated for COVID-19 pneumonia and discharged home on 12/23.  Family reported that the patient however continues to have dry cough, subjective fever and generalized weakness.  And patient continue to experience lightheadedness when standing up but no loss of consciousness or fall.  Denied any diarrhea, no urinary problems no chest pain or abdominal pain. -Patient with fever 101.4, blood pressure borderline low, this x-ray suspicious for left lower lobe infiltrate, white blood cell 12.6, creatinine 1.7, he is admitted for further work-up.      Assessment & Plan:   Principal Problem:   CAP (community acquired pneumonia) Active Problems:   Community acquired pneumonia   Postviral bacterial pneumonia left lower lung, CURB65=2 -Empirical treatment with ceftriaxone and doxycycline, recommended by pharmacist due to patient history of myasthenia gravis -Bronchodilators and incentive spirometry -Culture sputum, atypical pneumonia study. - Patient has a normal echocardiogram this month, thus low suspicion for CHF.  And COVID was treated with 5 days of Paxlovid. - as will could still be related to [place] given patient is immunocompromised, for now there is no significant hypoxia will hold on IV steroids.  meanwhile I will check his SARS-CoV-2 antibody IgG -Patient was encouraged use incentive spirometer, flutter valve, he will be started on cough  medicine.   Deconditioning/generalized weakness -PT evaluation   Dehydration -continue with IV fluids   Near syncope with baseline secondary to AV block and PAF -likely due to volume depletion. - has loop recorder, EP interrogated loop recorder  showing A. fib with PVCs, otherwise no pauses, brady or tachy episodes   Myasthenia gravis -he has generalized weakness, but muscle strength is preserved, denied any lid lagging or trouble taking deep breath, low suspicion for flareup.  Continue pyridostigmine -We will double dose of his baseline prednisone for his pneumonia.   PAF -Rate controlled -Continue Eliquis   CKD stage II -Symptoms signs of hypovolemia, short course IV fluid as above.   Advanced dementia -Stable.     DVT prophylaxis:  Eliquis Code Status: Full Family Communication: wife at bedside Disposition:   Status is: Inpatient  Remains inpatient appropriate because: On IV antibiotics, still requiring oxygen and treatment for pneumonia.       Consultants:  None   Subjective:  Patient still complaining of cough, shortness of breath, he is febrile with T-max 101.4.  Objective: Vitals:   04/06/21 0600 04/06/21 1000 04/06/21 1200 04/06/21 1300  BP: 113/65 (!) 153/135 120/64 120/73  Pulse: (!) 37 92  (!) 106  Resp: (!) 29 (!) 37 (!) 29 (!) 30  Temp:      TempSrc:      SpO2: 100% 98%  97%   No intake or output data in the 24 hours ending 04/06/21 1433 There were no vitals filed for this visit.  Examination:  Awake Alert, Oriented X 3, No new F.N deficits, Normal affect, frail and deconditioned Symmetrical Chest wall movement, Good air movement bilaterally, scattered Rales RRR,No Gallops,Rubs or new Murmurs, No Parasternal  Heave +ve B.Sounds, Abd Soft, No tenderness, No rebound - guarding or rigidity. No Cyanosis, Clubbing or edema, No new Rash or bruise       Data Reviewed: I have personally reviewed following labs and imaging  studies  CBC: Recent Labs  Lab 04/04/21 2254 04/06/21 0222  WBC 12.6* 11.6*  HGB 14.1 14.4  HCT 41.8 43.8  MCV 95.0 96.1  PLT 234 951    Basic Metabolic Panel: Recent Labs  Lab 04/04/21 2254 04/06/21 0222  NA 135 134*  K 4.0 4.0  CL 104 105  CO2 23 18*  GLUCOSE 125* 102*  BUN 24* 27*  CREATININE 1.70* 1.56*  CALCIUM 8.2* 8.1*    GFR: CrCl cannot be calculated (Unknown ideal weight.).  Liver Function Tests: Recent Labs  Lab 04/04/21 2254  AST 29  ALT 53*  ALKPHOS 52  BILITOT 1.0  PROT 6.0*  ALBUMIN 2.7*    CBG: No results for input(s): GLUCAP in the last 168 hours.   Recent Results (from the past 240 hour(s))  Resp Panel by RT-PCR (Flu A&B, Covid) Nasopharyngeal Swab     Status: Abnormal   Collection Time: 04/04/21 11:54 PM   Specimen: Nasopharyngeal Swab; Nasopharyngeal(NP) swabs in vial transport medium  Result Value Ref Range Status   SARS Coronavirus 2 by RT PCR POSITIVE (A) NEGATIVE Final    Comment: (NOTE) SARS-CoV-2 target nucleic acids are DETECTED.  The SARS-CoV-2 RNA is generally detectable in upper respiratory specimens during the acute phase of infection. Positive results are indicative of the presence of the identified virus, but do not rule out bacterial infection or co-infection with other pathogens not detected by the test. Clinical correlation with patient history and other diagnostic information is necessary to determine patient infection status. The expected result is Negative.  Fact Sheet for Patients: EntrepreneurPulse.com.au  Fact Sheet for Healthcare Providers: IncredibleEmployment.be  This test is not yet approved or cleared by the [place] FDA and  has been authorized for detection and/or diagnosis of SARS-CoV-2 by FDA under an Emergency Use Authorization (EUA).  This EUA will remain in effect (meaning this test can be used) for the duration of  the COVID-19 declaration under  Section 564(b)(1) of the A ct, 21 U.S.C. section 360bbb-3(b)(1), unless the authorization is terminated or revoked sooner.     Influenza A by PCR NEGATIVE NEGATIVE Final   Influenza B by PCR NEGATIVE NEGATIVE Final    Comment: (NOTE) The Xpert Xpress SARS-CoV-2/FLU/RSV plus assay is intended as an aid in the diagnosis of influenza from Nasopharyngeal swab specimens and should not be used as a sole basis for treatment. Nasal washings and aspirates are unacceptable for Xpert Xpress SARS-CoV-2/FLU/RSV testing.  Fact Sheet for Patients: EntrepreneurPulse.com.au  Fact Sheet for Healthcare Providers: IncredibleEmployment.be  This test is not yet approved or cleared by the [place] FDA and has been authorized for detection and/or diagnosis of SARS-CoV-2 by FDA under an Emergency Use Authorization (EUA). This EUA will remain in effect (meaning this test can be used) for the duration of the COVID-19 declaration under Section 564(b)(1) of the Act, 21 U.S.C. section 360bbb-3(b)(1), unless the authorization is terminated or revoked.  Performed at [facility], [place] [address], [place], [place] [address]   Culture, blood (routine x 2)     Status: None (Preliminary result)   Collection Time: 04/06/21  2:22 AM   Specimen: BLOOD LEFT ARM  Result Value Ref Range Status   Specimen Description BLOOD LEFT ARM  Final   Special Requests   Final    BOTTLES DRAWN AEROBIC AND ANAEROBIC Blood Culture adequate volume   Culture   Final    NO GROWTH < 12 HOURS Performed at [facility], [address], [place], [place] [address]    Report Status PENDING  Incomplete         Radiology Studies: CT Head Wo Contrast  Result Date: 04/05/2021 CLINICAL DATA:  Mental status change. EXAM: CT HEAD WITHOUT CONTRAST TECHNIQUE: Contiguous axial images were obtained from the base of the skull through the vertex without intravenous contrast. COMPARISON:   03/26/2021 FINDINGS: Brain: No evidence of acute infarction, hemorrhage, hydrocephalus, extra-axial collection or mass lesion/mass effect. There is marked diffuse low-attenuation within the subcortical and periventricular white matter compatible with chronic microvascular disease. Prominence of the sulci and ventricles compatible with brain atrophy. Vascular: No hyperdense vessel or unexpected calcification. Skull: Normal. Negative for fracture or focal lesion. Sinuses/Orbits: No acute abnormality. Mucosal thickening is again noted involving the maxillary sinuses, ethmoid air cells, frontal sinus and sphenoid sinus. Mastoid air cells are clear. Orbits are unremarkable. Other: None IMPRESSION: 1. No acute intracranial abnormalities. 2. Chronic small vessel ischemic disease and brain atrophy. 3. Chronic sinus inflammation. Electronically Signed   By: Kerby Moors M.D.   On: 04/05/2021 06:16   DG Chest Portable 1 View  Result Date: 04/05/2021 CLINICAL DATA:  Weakness.  COVID positive.  Shortness of breath. EXAM: PORTABLE CHEST 1 VIEW COMPARISON:  03/24/2021 and 03/22/2021. FINDINGS: Again noted are patchy densities in the right upper lung which have minimally changed. Patchy densities in left lower chest have slightly progressed and could represent acute on chronic disease. Heart is prominent for size but there are also low lung volumes. Again noted is a cardiac recorder in the left anterior chest. Negative for pneumothorax. IMPRESSION: Bilateral patchy lung densities particularly in the left lower lung. Left lower lung densities appear to have a chronic component but cannot exclude acute on chronic disease. Findings are concerning for multifocal pneumonia. Electronically Signed   By: Markus Daft M.D.   On: 04/05/2021 10:44        Scheduled Meds:  apixaban  5 mg Oral BID   donepezil  10 mg Oral QHS   doxycycline  100 mg Oral Q12H   feeding supplement  237 mL Oral BID BM   guaiFENesin  1,200 mg Oral BID    ipratropium-albuterol  3 mL Nebulization Q6H   latanoprost  1 drop Both Eyes QHS   memantine  20 mg Oral QHS   pantoprazole  40 mg Oral Daily   predniSONE  15 mg Oral Q breakfast   pyridostigmine  60 mg Oral BID   Continuous Infusions:  cefTRIAXone (ROCEPHIN)  IV 2 g (04/06/21 1333)     LOS: 1 day       Phillips Climes, MD [facility]   To contact the attending provider between 7A-7P or the covering provider during after hours 7P-7A, please log into the web site [email or web address] and access using universal [place] password for that web site. If you do not have the password, please call the hospital operator.  04/06/2021, 2:33 PM   Patient ID: Elijah Ponder Sr., male   DOB: 1934-06-02, 87 y.o.   MRN: [id number]

## 2021-04-06 NOTE — Progress Notes (Signed)
[facility] Transmission 04/05/21 personally reviewed. The patient has a [employer] [id number] implanted loop recorder.  DOI 12/26/20.  The patients presenting rhythm on loop recorder is afib with PVCs.  There were no new findings. 0 symptomatic events 0 tachy episodes 0 pause episodes 0  brady episodes  AF burden 1.7%  The patients activity level has been very low since 03/22/21.  Histograms are within expected range.  No worrisome findings  Thompson Grayer MD, Early 04/06/2021 8:20 AM

## 2021-04-07 LAB — CBC
HCT: 41.4 % (ref 39.0–52.0)
Hemoglobin: 13.9 g/dL (ref 13.0–17.0)
MCH: 31.5 pg (ref 26.0–34.0)
MCHC: 33.6 g/dL (ref 30.0–36.0)
MCV: 93.9 fL (ref 80.0–100.0)
Platelets: 208 10*3/uL (ref 150–400)
RBC: 4.41 MIL/uL (ref 4.22–5.81)
RDW: 13.2 % (ref 11.5–15.5)
WBC: 12.6 10*3/uL — ABNORMAL HIGH (ref 4.0–10.5)
nRBC: 0 % (ref 0.0–0.2)

## 2021-04-07 LAB — BASIC METABOLIC PANEL
Anion gap: 9 (ref 5–15)
BUN: 22 mg/dL (ref 8–23)
CO2: 21 mmol/L — ABNORMAL LOW (ref 22–32)
Calcium: 8.1 mg/dL — ABNORMAL LOW (ref 8.9–10.3)
Chloride: 106 mmol/L (ref 98–111)
Creatinine, Ser: 1.38 mg/dL — ABNORMAL HIGH (ref 0.61–1.24)
GFR, Estimated: 50 mL/min — ABNORMAL LOW (ref >60)
Glucose, Bld: 101 mg/dL — ABNORMAL HIGH (ref 70–99)
Potassium: 4.2 mmol/L (ref 3.5–5.1)
Sodium: 136 mmol/L (ref 135–145)

## 2021-04-07 MED ORDER — SODIUM CHLORIDE 0.9 % IV SOLN
200.0000 mg | Freq: Once | INTRAVENOUS | Status: AC
  Administered 2021-04-07: 200 mg via INTRAVENOUS
  Filled 2021-04-07: qty 40

## 2021-04-07 MED ORDER — SODIUM CHLORIDE 0.9 % IV SOLN
100.0000 mg | Freq: Every day | INTRAVENOUS | Status: AC
  Administered 2021-04-08 – 2021-04-11 (×4): 100 mg via INTRAVENOUS
  Filled 2021-04-07 (×4): qty 20

## 2021-04-07 MED ORDER — IPRATROPIUM-ALBUTEROL 0.5-2.5 (3) MG/3ML IN SOLN: 3.0000 mL | RESPIRATORY_TRACT | Status: DC | PRN

## 2021-04-07 NOTE — Plan of Care (Signed)
  Problem: Activity: Goal: Ability to tolerate increased activity will improve Outcome: Progressing   Problem: Clinical Measurements: Goal: Ability to maintain a body temperature in the normal range will improve Outcome: Progressing   Problem: Respiratory: Goal: Ability to maintain adequate ventilation will improve Outcome: Progressing Goal: Ability to maintain a clear airway will improve Outcome: Progressing   

## 2021-04-07 NOTE — Progress Notes (Signed)
Occupational Therapy Treatment Patient Details Name: Elijah Shahan Sr. MRN: [id number] DOB: [birth year] Today's Date: 04/07/2021   History of present illness 87yo male wh presented on 12/30 with persistent cough, fever, and weakness. Of note had recent hospitalization for Covid and DCed home 12/23. Now readmitted with CAP, postviral bacterial pneumonia in LLL. 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 Patient was min/mod assist to get to EOB due to assistance needed with BLEs and trunk. Patient performed grooming tasks seated on EOB and was agreeable to transfer to recliner. Patient performed transfer with RW and min assist for balance and safety. Patient performed transfer training in recliner to simulate toilet transfers with education on hand placement and body mechanics. Acute OT to continue to follow.    Recommendations for follow up therapy are one component of a multi-disciplinary discharge planning process, led by the attending physician.  Recommendations may be updated based on patient status, additional functional criteria and insurance authorization.    Follow Up Recommendations  Home health OT    Assistance Recommended at Discharge Frequent or constant Supervision/Assistance  Equipment Recommendations  Wheelchair (measurements OT)    Recommendations for Other Services      Precautions / Restrictions Precautions Precautions: Fall;Other (comment) Precaution Comments: myasthenia gravis       Mobility Bed Mobility Overal bed mobility: Needs Assistance Bed Mobility: Supine to Sit     Supine to sit: Min assist;Mod assist     General bed mobility comments: assistance with LEs and trunk    Transfers Overall transfer level: Needs assistance Equipment used: Rolling walker (2 wheels) Transfers: Sit to/from Stand;Bed to  chair/wheelchair/BSC Sit to Stand: Min assist   Step pivot transfers: Min assist       General transfer comment: min assist to stedy     Balance Overall balance assessment: Needs assistance Sitting-balance support: Feet supported Sitting balance-Leahy Scale: Fair Sitting balance - Comments: able to perform grooming tasks seated on EOB   Standing balance support: Bilateral upper extremity supported;During functional activity Standing balance-Leahy Scale: Poor Standing balance comment: reliant on RW for balance when standing                           ADL either performed or assessed with clinical judgement   ADL Overall ADL's : Needs assistance/impaired     Grooming: Set up;Sitting;Supervision/safety Grooming Details (indicate cue type and reason): performed seated on EOB                 Toilet Transfer: Minimal assistance;Rolling walker (2 wheels) Toilet Transfer Details (indicate cue type and reason): simulated with recliner         Functional mobility during ADLs: Minimal assistance;Rolling walker (2 wheels) General ADL Comments: more motivated towards therapy on this date    Extremity/Trunk Assessment              Vision       Perception     Praxis      Cognition Arousal/Alertness: Awake/alert Behavior During Therapy: Flat affect Overall Cognitive Status: History of cognitive impairments - at baseline                                 General Comments: Pleasant and eager to get out  of bed, talkative          Exercises     Shoulder Instructions       General Comments      Pertinent Vitals/ Pain       Pain Assessment: Faces Faces Pain Scale: Hurts a little bit Pain Location: generalized, grimaced during movement Pain Descriptors / Indicators: Discomfort;Grimacing Pain Intervention(s): Monitored during session;Repositioned  Home Living                                          Prior  Functioning/Environment              Frequency  Min 2X/week        Progress Toward Goals  OT Goals(current goals can now be found in the care plan section)  Progress towards OT goals: Progressing toward goals  Acute Rehab OT Goals Patient Stated Goal: go home OT Goal Formulation: With patient Time For Goal Achievement: 04/20/21 Potential to Achieve Goals: Good ADL Goals Pt Will Perform Grooming: with supervision;standing Pt Will Perform Upper Body Dressing: with set-up;sitting Pt Will Perform Lower Body Dressing: with set-up;with supervision;sit to/from stand Pt Will Transfer to Toilet: with supervision;ambulating  Plan Discharge plan needs to be updated;Frequency remains appropriate    Co-evaluation                 AM-PAC OT "6 Clicks" Daily Activity     Outcome Measure   Help from another person eating meals?: A Little Help from another person taking care of personal grooming?: A Little Help from another person toileting, which includes using toliet, bedpan, or urinal?: A Lot Help from another person bathing (including washing, rinsing, drying)?: A Lot Help from another person to put on and taking off regular upper body clothing?: A Little Help from another person to put on and taking off regular lower body clothing?: A Lot 6 Click Score: 15    End of Session Equipment Utilized During Treatment: Rolling walker (2 wheels)  OT Visit Diagnosis: Unsteadiness on feet (R26.81);Other abnormalities of gait and mobility (R26.89);Muscle weakness (generalized) (M62.81);Pain   Activity Tolerance Patient tolerated treatment well   Patient Left in chair;with call bell/phone within reach;with chair alarm set   Nurse Communication Mobility status        Time: 8299-3716 OT Time Calculation (min): 23 min  Charges: OT General Charges $OT Visit: 1 Visit OT Treatments $Self Care/Home Management : 8-22 mins $Therapeutic Activity: 8-22 mins  Lodema Hong, [place]  Pager [phone] Office [phone]   Trixie Dredge 04/07/2021, 2:52 PM

## 2021-04-07 NOTE — Progress Notes (Signed)
PROGRESS NOTE    Elijah Melroy Sr.  IEP:[id number] DOB: 04-Oct-1934 DOA: 04/04/2021 PCP: Olin Hauser, DO    Chief Complaint  Patient presents with   Urinary Tract Infection    Brief Narrative:  Elijah Ponder Sr. is a 87 y.o. 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   Patient was treated for COVID-19 pneumonia and discharged home on 12/23.  Family reported that the patient however continues to have dry cough, subjective fever and generalized weakness.  And patient continue to experience lightheadedness when standing up but no loss of consciousness or fall.  Denied any diarrhea, no urinary problems no chest pain or abdominal pain. -Patient with fever 101.4, blood pressure borderline low, this x-ray suspicious for left lower lobe infiltrate, white blood cell 12.6, creatinine 1.7, he is admitted for further work-up  Assessment & Plan:   Principal Problem:   CAP (community acquired pneumonia) Active Problems:   Community acquired pneumonia   pneumonia left lower lung, possible bacterial pneumonia/COVID-19 of pneumonia -Empirical treatment with ceftriaxone and doxycycline, recommended by pharmacist due to patient history of myasthenia gravis -Bronchodilators and incentive spirometry -Culture sputum, atypical pneumonia study. - Patient has a normal echocardiogram this month, thus low suspicion for CHF.  And COVID was treated with 5 days of Paxlovid. -Patient is immunocompromised, COVID antibody test was obtained, nonreactive, discussed with ID, he will be treated with IV remdesivir x5 days . -Was encouraged to get out of bed to chair, use incentive spirometer and flutter valve.   Deconditioning/generalized weakness -PT  evaluation   Dehydration -continue with IV fluids   Near syncope with baseline secondary to AV block and PAF -likely due to volume depletion. - has loop recorder, EP interrogated loop recorder  showing A. fib with PVCs, otherwise no pauses, brady or tachy episodes   Myasthenia gravis -he has generalized weakness, but muscle strength is preserved, denied any lid lagging or trouble taking deep breath, low suspicion for flareup.  Continue pyridostigmine -We will double dose of his baseline prednisone for his pneumonia.   PAF -Rate controlled -Continue Eliquis   CKD stage II -Symptoms signs of hypovolemia, short course IV fluid as above.   Advanced dementia -Stable.     DVT prophylaxis:  Eliquis Code Status: Full Family Communication: wife at bedside Disposition:   Status is: Inpatient  Remains inpatient appropriate because: On IV antibiotics, still requiring oxygen and treatment for pneumonia.       Consultants:  None   Subjective:  Reports dyspnea has improved, still complaining of cough, generalized weakness and fatigue  Objective: Vitals:   04/07/21 0400 04/07/21 0745 04/07/21 0828 04/07/21 1205  BP: 113/60 122/63  108/80  Pulse: 99 (!) 105  86  Resp: (!) 22 19  (!) 31  Temp: 98.6 F (37 C) 97.7 F (36.5 C)  98.8 F (37.1 C)  TempSrc: Oral Oral  Oral  SpO2: 94% 92% 94% 93%    Intake/Output Summary (Last 24 hours) at 04/07/2021 1300 Last data filed at 04/06/2021 1449 Gross per 24 hour  Intake 102.64 ml  Output --  Net 102.64 ml   There were no vitals filed for this visit.  Examination:  Awake Alert, Oriented X 3, No new F.N deficits, Normal affect, frail and deconditioned Symmetrical Chest wall movement, Good air movement bilaterally,  CTAB RRR,No Gallops,Rubs or new Murmurs, No Parasternal Heave +ve B.Sounds, Abd Soft, No tenderness, No rebound - guarding or rigidity. No Cyanosis, Clubbing or edema, No new Rash or bruise       Data Reviewed: I  have personally reviewed following labs and imaging studies  CBC: Recent Labs  Lab 04/04/21 2254 04/06/21 0222 04/07/21 0303  WBC 12.6* 11.6* 12.6*  HGB 14.1 14.4 13.9  HCT 41.8 43.8 41.4  MCV 95.0 96.1 93.9  PLT 234 219 119    Basic Metabolic Panel: Recent Labs  Lab 04/04/21 2254 04/06/21 0222 04/07/21 0303  NA 135 134* 136  K 4.0 4.0 4.2  CL 104 105 106  CO2 23 18* 21*  GLUCOSE 125* 102* 101*  BUN 24* 27* 22  CREATININE 1.70* 1.56* 1.38*  CALCIUM 8.2* 8.1* 8.1*    GFR: CrCl cannot be calculated (Unknown ideal weight.).  Liver Function Tests: Recent Labs  Lab 04/04/21 2254  AST 29  ALT 53*  ALKPHOS 52  BILITOT 1.0  PROT 6.0*  ALBUMIN 2.7*    CBG: No results for input(s): GLUCAP in the last 168 hours.   Recent Results (from the past 240 hour(s))  Resp Panel by RT-PCR (Flu A&B, Covid) Nasopharyngeal Swab     Status: Abnormal   Collection Time: 04/04/21 11:54 PM   Specimen: Nasopharyngeal Swab; Nasopharyngeal(NP) swabs in vial transport medium  Result Value Ref Range Status   SARS Coronavirus 2 by RT PCR POSITIVE (A) NEGATIVE Final    Comment: (NOTE) SARS-CoV-2 target nucleic acids are DETECTED.  The SARS-CoV-2 RNA is generally detectable in upper respiratory specimens during the acute phase of infection. Positive results are indicative of the presence of the identified virus, but do not rule out bacterial infection or co-infection with other pathogens not detected by the test. Clinical correlation with patient history and other diagnostic information is necessary to determine patient infection status. The expected result is Negative.  Fact Sheet for Patients: EntrepreneurPulse.com.au  Fact Sheet for Healthcare Providers: IncredibleEmployment.be  This test is not yet approved or cleared by the [place] FDA and  has been authorized for detection and/or diagnosis of SARS-CoV-2 by FDA under an Emergency Use  Authorization (EUA).  This EUA will remain in effect (meaning this test can be used) for the duration of  the COVID-19 declaration under Section 564(b)(1) of the A ct, 21 U.S.C. section 360bbb-3(b)(1), unless the authorization is terminated or revoked sooner.     Influenza A by PCR NEGATIVE NEGATIVE Final   Influenza B by PCR NEGATIVE NEGATIVE Final    Comment: (NOTE) The Xpert Xpress SARS-CoV-2/FLU/RSV plus assay is intended as an aid in the diagnosis of influenza from Nasopharyngeal swab specimens and should not be used as a sole basis for treatment. Nasal washings and aspirates are unacceptable for Xpert Xpress SARS-CoV-2/FLU/RSV testing.  Fact Sheet for Patients: EntrepreneurPulse.com.au  Fact Sheet for Healthcare Providers: IncredibleEmployment.be  This test is not yet approved or cleared by the [place] FDA and has been authorized for detection and/or diagnosis of SARS-CoV-2 by FDA under an Emergency Use Authorization (EUA). This EUA will remain in effect (meaning this test can be used) for the duration of the COVID-19 declaration under Section 564(b)(1) of the Act, 21 U.S.C. section 360bbb-3(b)(1), unless the authorization is terminated or revoked.  Performed at [facility], [place] [address], [place], [place] [address]   Culture, blood (routine x 2)     Status: None (Preliminary result)   Collection  Time: 04/06/21  2:22 AM   Specimen: BLOOD LEFT ARM  Result Value Ref Range Status   Specimen Description BLOOD LEFT ARM  Final   Special Requests   Final    BOTTLES DRAWN AEROBIC AND ANAEROBIC Blood Culture adequate volume   Culture   Final    NO GROWTH 1 DAY Performed at [facility], [address], [place], [place] [address]    Report Status PENDING  Incomplete         Radiology Studies: No results found.      Scheduled Meds:  apixaban  5 mg Oral BID   donepezil  10 mg Oral QHS   doxycycline  100 mg  Oral Q12H   feeding supplement  237 mL Oral BID BM   guaiFENesin  600 mg Oral BID   latanoprost  1 drop Both Eyes QHS   memantine  20 mg Oral QHS   pantoprazole  40 mg Oral Daily   predniSONE  15 mg Oral Q breakfast   pyridostigmine  60 mg Oral BID   Continuous Infusions:  cefTRIAXone (ROCEPHIN)  IV Stopped (04/06/21 1410)   remdesivir 200 mg in sodium chloride 0.9% 250 mL IVPB     Followed by   Derrill Memo ON 04/08/2021] remdesivir 100 mg in NS 100 mL       LOS: 2 days       Phillips Climes, MD [facility]   To contact the attending provider between 7A-7P or the covering provider during after hours 7P-7A, please log into the web site [email or web address] and access using universal [place] password for that web site. If you do not have the password, please call the hospital operator.  04/07/2021, 1:00 PM   Patient ID: Elijah Bolds Sr., male   DOB: [birth year], 87 y.o.   MRN: [id number] Patient ID: Elijah Hayduk Sr., male   DOB: 22-Aug-1934, 87 y.o.   MRN: [id number]

## 2021-04-07 NOTE — Progress Notes (Signed)
Pharmacy Antibiotic Note  Elijah Musial Sr. is a 87 y.o. male admitted on 04/04/2021 with  COVID .  Pharmacy has been consulted for Remdesivir dosing.  Patient recently vaccinated against COVID 01/2021. Chronically immunosuppressed on prednisone for myasthenias gravis. Admitted 03/22/21 for syncope found to be COVID+. Treated with x1 remdesivir followed by Paxlovid. Also treated for concurrent bacterial pneumonia. 04/06/21 COVID IgG serology remained non-reactive. Per discussion with provider, will retrial remdesivir.   Plan: Remdesivir 200mg  x1, followed by 100mg  for 5 days total   Temp (24hrs), Avg:98 F (36.7 C), Min:97.7 F (36.5 C), Max:98.6 F (37 C)  Recent Labs  Lab 04/04/21 2254 04/06/21 0222 04/07/21 0303  WBC 12.6* 11.6* 12.6*  CREATININE 1.70* 1.56* 1.38*    CrCl cannot be calculated (Unknown ideal weight.).    Allergies  Allergen Reactions   Pravastatin Other (See Comments)   Simvastatin Other (See Comments)    Antimicrobials this admission: Remdesivir x1 12/17 Paxlovid 12/18 >> 12/22   Microbiology results: 12/17 COVID+ 12/30 COVID+   Thank you for allowing pharmacy to be a part of this patients care.  Ardyth Harps, PharmD Clinical Pharmacist

## 2021-04-07 NOTE — Progress Notes (Signed)
NIF -35 FVC 1.86L  Pt put in good effort.

## 2021-04-07 NOTE — TOC Progression Note (Signed)
Transition of Care (TOC) - Progression Note    Patient Details  Name: Elijah Monjaraz Sr. MRN: [id number] Date of Birth: 1935/02/15  Transition of Care [facility]) CM/SW Contact  Bartholomew Crews, RN Phone Number: [phone] 04/07/2021, 11:31 AM  Clinical Narrative:     Damaris Schooner with Ramond Marrow at [facility] to confirm active status for [facility] PT and OT. Will need HH orders for resumption of services. TOC following for transition needs.        Expected Discharge Plan and Services     Discharge Planning Services: CM Consult                               HH Arranged: PT, OT [place] Agency: [place] ([facility]) Date [place]: 04/07/21 Time [place]: 87 Representative spoke with at [place]: [place] Determinants of Health ([place]) Interventions    Readmission Risk Interventions No flowsheet data found.

## 2021-04-08 LAB — CBC
HCT: 37.3 % — ABNORMAL LOW (ref 39.0–52.0)
Hemoglobin: 13.1 g/dL (ref 13.0–17.0)
MCH: 32.5 pg (ref 26.0–34.0)
MCHC: 35.1 g/dL (ref 30.0–36.0)
MCV: 92.6 fL (ref 80.0–100.0)
Platelets: 197 10*3/uL (ref 150–400)
RBC: 4.03 MIL/uL — ABNORMAL LOW (ref 4.22–5.81)
RDW: 13.2 % (ref 11.5–15.5)
WBC: 11.1 10*3/uL — ABNORMAL HIGH (ref 4.0–10.5)
nRBC: 0 % (ref 0.0–0.2)

## 2021-04-08 LAB — BASIC METABOLIC PANEL
Anion gap: 9 (ref 5–15)
BUN: 26 mg/dL — ABNORMAL HIGH (ref 8–23)
CO2: 20 mmol/L — ABNORMAL LOW (ref 22–32)
Calcium: 7.9 mg/dL — ABNORMAL LOW (ref 8.9–10.3)
Chloride: 104 mmol/L (ref 98–111)
Creatinine, Ser: 1.16 mg/dL (ref 0.61–1.24)
GFR, Estimated: >60 mL/min (ref >60)
Glucose, Bld: 115 mg/dL — ABNORMAL HIGH (ref 70–99)
Potassium: 4 mmol/L (ref 3.5–5.1)
Sodium: 133 mmol/L — ABNORMAL LOW (ref 135–145)

## 2021-04-08 MED ORDER — SODIUM CHLORIDE 0.9 % IV SOLN
2.0000 g | INTRAVENOUS | Status: AC
  Administered 2021-04-09: 2 g via INTRAVENOUS
  Filled 2021-04-08: qty 20

## 2021-04-08 NOTE — Progress Notes (Signed)
Patient IV medication administered but refused PO medication. Wife to try and assist in the administration of PO medication

## 2021-04-08 NOTE — Progress Notes (Signed)
Physical Therapy Treatment Patient Details Name: Elijah Hamm Sr. MRN: [id number] DOB: [birth year] Today's Date: 04/08/2021   History of Present Illness 87yo male wh presented on 12/30 with persistent cough, fever, and weakness. Of note had recent hospitalization for Covid and DCed home 12/23. Now readmitted with CAP, postviral bacterial pneumonia in LLL. 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 Patient irritable and participated with seated exercises with max cues and hands-on assist at times. Patient seemingly agreeable to stand for further activity and prepared to stand x 3 without ever trying to stand. At that  point pt became adamant he would not stand and session terminated. Wife present throughout and encouraging pt to work with therapy with varying results.    Recommendations for follow up therapy are one component of a multi-disciplinary discharge planning process, led by the attending physician.  Recommendations may be updated based on patient status, additional functional criteria and insurance authorization.  Follow Up Recommendations  Home health PT (needs max HHPT services for best outcomes; wife refusing SNF)     Assistance Recommended at Discharge Frequent or constant Supervision/Assistance  Patient can return home with the following A lot of help with walking and/or transfers;Direct supervision/assist for medications management;Direct supervision/assist for financial management;Help with stairs or ramp for entrance   Equipment Recommendations  Rolling walker (2 wheels);BSC/3in1;Wheelchair (measurements PT);Wheelchair cushion (measurements PT)    Recommendations for Other Services       Precautions / Restrictions Precautions Precautions: Fall;Other (comment) Precaution Comments: myasthenia  gravis Restrictions Weight Bearing Restrictions: No     Mobility  Bed Mobility               General bed mobility comments: up in recliner on arrival    Transfers                   General transfer comment: pt prepared to stand x 3, but then would refuse to stand (scoot to get feet on the floor, feet under, hands on armrests). After 3rd attempt, adamantly stating he will not get up. Wife attempted to persuade without success    Ambulation/Gait               General Gait Details: educated on need to ambulate to return home (pt stating he wants to go home) however this also did not persuade him to et up on his Scientist, physiological Rankin (Stroke Patients Only)       Balance Overall balance assessment: Needs assistance Sitting-balance support: Feet supported Sitting balance-Leahy Scale: Fair                                      Cognition Arousal/Alertness: Awake/alert Behavior During Therapy: Flat affect (irritable) Overall Cognitive Status: History of cognitive impairments - at baseline                                 General Comments: "grumpy" per wife; not as cooperative as previous session        Exercises General Exercises - Upper Extremity Elbow Flexion: AROM;Both;5 reps (would  not do more reps) General Exercises - Lower Extremity Long Arc Quad: AROM;Both;10 reps Hip Flexion/Marching: AROM;Both;10 reps Heel Raises: AAROM;Both;10 reps;Seated    General Comments General comments (skin integrity, edema, etc.): Wife present and encouraging pt to participate. HR 106-118 bpm. Sats 93% on 5L      Pertinent Vitals/Pain Pain Assessment: Faces Faces Pain Scale: No hurt    Home Living                          Prior Function            PT Goals (current goals can now be found in the care plan section) Acute Rehab PT Goals Patient Stated Goal: to return  home Time For Goal Achievement: 04/20/21 Potential to Achieve Goals: Fair Progress towards PT goals: Not progressing toward goals - comment (not cooperative and easily agitated)    Frequency    Min 3X/week      PT Plan Current plan remains appropriate    Co-evaluation              AM-PAC PT "6 Clicks" Mobility   Outcome Measure  Help needed turning from your back to your side while in a flat bed without using bedrails?: A Little Help needed moving from lying on your back to sitting on the side of a flat bed without using bedrails?: Total Help needed moving to and from a bed to a chair (including a wheelchair)?: A Lot Help needed standing up from a chair using your arms (e.g., wheelchair or bedside chair)?: A Lot Help needed to walk in hospital room?: Total Help needed climbing 3-5 steps with a railing? : Total 6 Click Score: 10    End of Session Equipment Utilized During Treatment: Oxygen Activity Tolerance: Treatment limited secondary to agitation Patient left: with call bell/phone within reach;with family/visitor present;in chair (ED stretcher)   PT Visit Diagnosis: Unsteadiness on feet (R26.81);Muscle weakness (generalized) (M62.81)     Time: 0950-1005 PT Time Calculation (min) (ACUTE ONLY): 15 min  Charges:  $Therapeutic Exercise: 8-22 mins                      Arby Barrette, PT Acute Rehabilitation Services  Pager [phone] Office [phone]    Rexanne Mano 04/08/2021, 10:21 AM

## 2021-04-08 NOTE — Plan of Care (Signed)
  Problem: Activity: Goal: Ability to tolerate increased activity will improve Outcome: Progressing   Problem: Clinical Measurements: Goal: Ability to maintain a body temperature in the normal range will improve Outcome: Progressing   Problem: Respiratory: Goal: Ability to maintain adequate ventilation will improve Outcome: Progressing Goal: Ability to maintain a clear airway will improve Outcome: Progressing   

## 2021-04-08 NOTE — Progress Notes (Signed)
Pt refused to do NIF & VC. Will try again at next scheduled time.

## 2021-04-08 NOTE — Progress Notes (Signed)
PROGRESS NOTE    Elijah Robarts Sr.  VOH:[id number] DOB: 1934/08/28 DOA: 04/04/2021 PCP: Olin Hauser, DO    Chief Complaint  Patient presents with   Urinary Tract Infection    Brief Narrative:  Elijah Ponder Sr. is a 87 y.o. 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   Patient was treated for COVID-19 pneumonia and discharged home on 12/23.  Family reported that the patient however continues to have dry cough, subjective fever and generalized weakness.  And patient continue to experience lightheadedness when standing up but no loss of consciousness or fall.  Denied any diarrhea, no urinary problems no chest pain or abdominal pain. -Patient with fever 101.4, blood pressure borderline low, this x-ray suspicious for left lower lobe infiltrate, white blood cell 12.6, creatinine 1.7, he is admitted for further work-up  Assessment & Plan:   Principal Problem:   CAP (community acquired pneumonia) Active Problems:   Community acquired pneumonia   pneumonia left lower lung, possible bacterial pneumonia/COVID-19 of pneumonia -Empirical treatment with ceftriaxone and doxycycline, recommended by pharmacist due to patient history of myasthenia gravis -Bronchodilators and incentive spirometry -Culture sputum, atypical pneumonia study. - Patient has a normal echocardiogram this month, thus low suspicion for CHF.  And COVID was treated with 5 days of Paxlovid. -Patient is immunocompromised, COVID antibody test was obtained, nonreactive, discussed with ID, he will be treated with IV remdesivir x5 days . -Was encouraged to get out of bed to chair, use incentive spirometer and flutter valve.   Deconditioning/generalized weakness -PT  evaluation   Dehydration -continue with IV fluids   Near syncope with baseline secondary to AV block and PAF -likely due to volume depletion. - has loop recorder, EP interrogated loop recorder  showing A. fib with PVCs, otherwise no pauses, brady or tachy episodes  -Continue with telemetry monitoring.  Myasthenia gravis -he has generalized weakness, but muscle strength is preserved, denied any lid lagging or trouble taking deep breath, low suspicion for flareup.  Continue pyridostigmine -We will double dose of his baseline prednisone for his pneumonia. -Need to monitor NIF and vital capacity daily   PAF -Rate controlled -Continue Eliquis   CKD stage II -Symptoms signs of hypovolemia, short course IV fluid as above.   Advanced dementia -Stable. Patient still reports some dyspnea, and cough, has generalized weakness and fatigue, he appears to be more confused today.   DVT prophylaxis:  Eliquis Code Status: Full Family Communication: wife at bedside Disposition:   Status is: Inpatient  Remains inpatient appropriate because: On IV antibiotics, still requiring oxygen and treatment for pneumonia.       Consultants:  None   Subjective:  Reports dyspnea has improved, still complaining of cough, generalized weakness and fatigue  Objective: Vitals:   04/08/21 0739 04/08/21 1010 04/08/21 1148 04/08/21 1223  BP: 120/84 (!) 109/58  109/67  Pulse: 100 99  85  Resp: (!) 27 (!) 25  (!) 25  Temp: 98.6 F (37 C) 98.5 F (36.9 C)  98.3 F (36.8 C)  TempSrc: Oral Oral Oral Oral  SpO2: 94% 91%  92%    Intake/Output Summary (Last 24 hours) at 04/08/2021 1431 Last data filed at 04/07/2021 2246 Gross per 24 hour  Intake 773.13 ml  Output --  Net 773.13 ml   There were no  vitals filed for this visit.  Examination:  Awake Alert, is oriented x1 today, appears to be more confused, frail, deconditioned.  Symmetrical Chest wall movement, Good air movement bilaterally,  CTAB RRR,No Gallops,Rubs or new Murmurs, No Parasternal Heave +ve B.Sounds, Abd Soft, No tenderness, No rebound - guarding or rigidity. No Cyanosis, Clubbing or edema, No new Rash or bruise       Data Reviewed: I have personally reviewed following labs and imaging studies  CBC: Recent Labs  Lab 04/04/21 2254 04/06/21 0222 04/07/21 0303 04/08/21 0122  WBC 12.6* 11.6* 12.6* 11.1*  HGB 14.1 14.4 13.9 13.1  HCT 41.8 43.8 41.4 37.3*  MCV 95.0 96.1 93.9 92.6  PLT 234 219 208 254    Basic Metabolic Panel: Recent Labs  Lab 04/04/21 2254 04/06/21 0222 04/07/21 0303 04/08/21 0122  NA 135 134* 136 133*  K 4.0 4.0 4.2 4.0  CL 104 105 106 104  CO2 23 18* 21* 20*  GLUCOSE 125* 102* 101* 115*  BUN 24* 27* 22 26*  CREATININE 1.70* 1.56* 1.38* 1.16  CALCIUM 8.2* 8.1* 8.1* 7.9*    GFR: CrCl cannot be calculated (Unknown ideal weight.).  Liver Function Tests: Recent Labs  Lab 04/04/21 2254  AST 29  ALT 53*  ALKPHOS 52  BILITOT 1.0  PROT 6.0*  ALBUMIN 2.7*    CBG: No results for input(s): GLUCAP in the last 168 hours.   Recent Results (from the past 240 hour(s))  Resp Panel by RT-PCR (Flu A&B, Covid) Nasopharyngeal Swab     Status: Abnormal   Collection Time: 04/04/21 11:54 PM   Specimen: Nasopharyngeal Swab; Nasopharyngeal(NP) swabs in vial transport medium  Result Value Ref Range Status   SARS Coronavirus 2 by RT PCR POSITIVE (A) NEGATIVE Final    Comment: (NOTE) SARS-CoV-2 target nucleic acids are DETECTED.  The SARS-CoV-2 RNA is generally detectable in upper respiratory specimens during the acute phase of infection. Positive results are indicative of the presence of the identified virus, but do not rule out bacterial infection or co-infection with other pathogens not detected by the test. Clinical correlation with patient history and other diagnostic information is necessary to determine patient infection status. The expected result is Negative.  Fact  Sheet for Patients: EntrepreneurPulse.com.au  Fact Sheet for Healthcare Providers: IncredibleEmployment.be  This test is not yet approved or cleared by the [place] FDA and  has been authorized for detection and/or diagnosis of SARS-CoV-2 by FDA under an Emergency Use Authorization (EUA).  This EUA will remain in effect (meaning this test can be used) for the duration of  the COVID-19 declaration under Section 564(b)(1) of the A ct, 21 U.S.C. section 360bbb-3(b)(1), unless the authorization is terminated or revoked sooner.     Influenza A by PCR NEGATIVE NEGATIVE Final   Influenza B by PCR NEGATIVE NEGATIVE Final    Comment: (NOTE) The Xpert Xpress SARS-CoV-2/FLU/RSV plus assay is intended as an aid in the diagnosis of influenza from Nasopharyngeal swab specimens and should not be used as a sole basis for treatment. Nasal washings and aspirates are unacceptable for Xpert Xpress SARS-CoV-2/FLU/RSV testing.  Fact Sheet for Patients: EntrepreneurPulse.com.au  Fact Sheet for Healthcare Providers: IncredibleEmployment.be  This test is not yet approved or cleared by the [place] FDA and has been authorized for detection and/or diagnosis of SARS-CoV-2 by FDA under an Emergency Use Authorization (EUA). This EUA will remain in effect (meaning this test can be used) for the duration of the COVID-19 declaration under Section  564(b)(1) of the Act, 21 U.S.C. section 360bbb-3(b)(1), unless the authorization is terminated or revoked.  Performed at [facility], [address] [place], [place] [address]   Culture, blood (routine x 2)     Status: None (Preliminary result)   Collection Time: 04/06/21  2:22 AM   Specimen: BLOOD LEFT ARM  Result Value Ref Range Status   Specimen Description BLOOD LEFT ARM  Final   Special Requests   Final    BOTTLES DRAWN AEROBIC AND ANAEROBIC Blood Culture adequate  volume   Culture   Final    NO GROWTH 2 DAYS Performed at [facility], [address], [place],  [address]    Report Status PENDING  Incomplete         Radiology Studies: No results found.      Scheduled Meds:  apixaban  5 mg Oral BID   donepezil  10 mg Oral QHS   doxycycline  100 mg Oral Q12H   feeding supplement  237 mL Oral BID BM   guaiFENesin  600 mg Oral BID   latanoprost  1 drop Both Eyes QHS   memantine  20 mg Oral QHS   pantoprazole  40 mg Oral Daily   predniSONE  15 mg Oral Q breakfast   pyridostigmine  60 mg Oral BID   Continuous Infusions:  remdesivir 100 mg in NS 100 mL 100 mg (04/08/21 0944)     LOS: 3 days       Phillips Climes, MD [facility]   To contact the attending provider between 7A-7P or the covering provider during after hours 7P-7A, please log into the web site [email or web address] and access using universal [place] password for that web site. If you do not have the password, please call the hospital operator.  04/08/2021, 2:31 PM   Patient ID: Elijah Ponder Sr., male   DOB: 26-May-1934, 87 y.o.   MRN: [id number] Patient ID: Elijah Heckmann Sr., male   DOB: 11/21/1934, 87 y.o.   MRN: [id number] Patient ID: Elijah Odland Sr., male   DOB: Jun 30, 1934, 87 y.o.   MRN: [id number]

## 2021-04-09 DIAGNOSIS — G7000 Myasthenia gravis without (acute) exacerbation: Secondary | ICD-10-CM

## 2021-04-09 DIAGNOSIS — J1282 Pneumonia due to coronavirus disease 2019: Secondary | ICD-10-CM

## 2021-04-09 DIAGNOSIS — J9601 Acute respiratory failure with hypoxia: Secondary | ICD-10-CM

## 2021-04-09 DIAGNOSIS — R5381 Other malaise: Secondary | ICD-10-CM

## 2021-04-09 LAB — D-DIMER, QUANTITATIVE: D-Dimer, Quant: 1.83 ug/mL-FEU — ABNORMAL HIGH (ref 0.00–0.50)

## 2021-04-09 LAB — CBC
HCT: 38.3 % — ABNORMAL LOW (ref 39.0–52.0)
Hemoglobin: 12.8 g/dL — ABNORMAL LOW (ref 13.0–17.0)
MCH: 31.2 pg (ref 26.0–34.0)
MCHC: 33.4 g/dL (ref 30.0–36.0)
MCV: 93.4 fL (ref 80.0–100.0)
Platelets: 202 10*3/uL (ref 150–400)
RBC: 4.1 MIL/uL — ABNORMAL LOW (ref 4.22–5.81)
RDW: 13.6 % (ref 11.5–15.5)
WBC: 8.6 10*3/uL (ref 4.0–10.5)
nRBC: 0 % (ref 0.0–0.2)

## 2021-04-09 LAB — C-REACTIVE PROTEIN: CRP: 19.7 mg/dL — ABNORMAL HIGH (ref <1.0)

## 2021-04-09 LAB — BASIC METABOLIC PANEL
Anion gap: 13 (ref 5–15)
BUN: 37 mg/dL — ABNORMAL HIGH (ref 8–23)
CO2: 15 mmol/L — ABNORMAL LOW (ref 22–32)
Calcium: 8.1 mg/dL — ABNORMAL LOW (ref 8.9–10.3)
Chloride: 105 mmol/L (ref 98–111)
Creatinine, Ser: 1.35 mg/dL — ABNORMAL HIGH (ref 0.61–1.24)
GFR, Estimated: 52 mL/min — ABNORMAL LOW (ref >60)
Glucose, Bld: 98 mg/dL (ref 70–99)
Potassium: 4.2 mmol/L (ref 3.5–5.1)
Sodium: 133 mmol/L — ABNORMAL LOW (ref 135–145)

## 2021-04-09 LAB — PROCALCITONIN: Procalcitonin: 0.4 ng/mL

## 2021-04-09 LAB — MYCOPLASMA PNEUMONIAE ANTIBODY, IGM: Mycoplasma pneumo IgM: <770 U/mL (ref 0–769)

## 2021-04-09 MED ORDER — BENZONATATE 100 MG PO CAPS: 200.0000 mg | ORAL_CAPSULE | Freq: Three times a day (TID) | ORAL | Status: DC | PRN

## 2021-04-09 NOTE — Progress Notes (Signed)
NIF : -40 IS : 1.25 L  Patient had good effort.

## 2021-04-09 NOTE — TOC Initial Note (Signed)
Transition of Care (TOC) - Initial/Assessment Note    Patient Details  Name: Elijah Carew Sr. MRN: [id number] Date of Birth: May 15, 1934  Transition of Care [facility]) CM/SW Contact:    Cyndi Bender, RN Phone Number: 04/09/2021, 12:54 PM  Clinical Narrative:                 Spoke to wife regarding transition needs. Wife states that she never declined SNF placement. 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 She did decline the wheelchair and 3&1 that was recommended by PT. I notified the CSW that wife would like to speech to her regarding SNF.  Expected Discharge Plan: [place] Barriers to Discharge: Continued Medical Work up   Patient Goals and CMS Choice        Expected Discharge Plan and Services Expected Discharge Plan: [place]   Discharge Planning Services: CM Consult   Living arrangements for the past 2 months: Single Family Home                           HH Arranged: PT, OT [place] Agency: [place] ([facility]) Date HH Agency Contacted: 04/07/21 Time HH Agency Contacted: 13 Representative spoke with at [place]: Ramond Marrow  Prior Living Arrangements/Services Living arrangements for the past 2 months: [place] Lives with:: Spouse Patient language and need for interpreter reviewed:: Yes        Need for Family Participation in Patient Care: Yes (Comment) Care giver support system in place?: Yes (comment) Current home services: DME, Home OT, Home PT (has walker) Criminal Activity/Legal Involvement Pertinent to Current Situation/Hospitalization: No - Comment as needed  Activities of Daily Living      Permission Sought/Granted                  Emotional Assessment           Psych Involvement: No (comment)  Admission diagnosis:  CAP (community acquired pneumonia) [J18.9] Pneumonia due to COVID-19 virus [U07.1, J12.82] COVID-19 [U07.1] Patient Active Problem List   Diagnosis Date Noted    CAP (community acquired pneumonia) 04/05/2021   Stage 3a chronic kidney disease ([place])    Orthostatic hypotension    AF (paroxysmal atrial fibrillation) ([place])    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    Dementia without behavioral disturbance, psychotic disturbance, mood disturbance, or anxiety ([place])    Pneumonia due to COVID-19 virus    Syncope and collapse 03/31/2018   Left-sided headache 01/25/2017   OSA (obstructive sleep apnea) 12/24/2016   Diarrhea 12/24/2016   Squamous cell carcinoma 09/07/2016   Cavitating mass of lung 04/17/2016   Gastritis 04/17/2016   Recurrent pulmonary emboli ([place]) 04/15/2016   Essential hypertension 04/14/2016   HLD (hyperlipidemia) 04/14/2016   GERD (gastroesophageal reflux disease) 04/14/2016   Community acquired pneumonia 04/14/2016   Hypokalemia 04/14/2016   Weakness 04/14/2016   Postcholecystectomy diarrhea 09/14/2013   Dizziness and giddiness 08/02/2013   Chronic cholecystitis 01/25/2013   Myasthenia gravis ([place]) 09/08/2012   PCP:  Olin Hauser, DO Pharmacy:   [facility], [place] [address] Phone: [phone] Fax: [phone]     Social Determinants of Health (SDOH) Interventions    Readmission Risk Interventions No flowsheet data found.

## 2021-04-09 NOTE — Progress Notes (Signed)
PROGRESS NOTE        PATIENT DETAILS Name: Elijah Petrosky Sr. Age: 87 y.o. Sex: male Date of Birth: [birth year] Admit Date: 04/04/2021 Admitting Physician Lequita Halt, MD HUD:[id number], Devonne Doughty, DO  Brief Narrative: Patient is a 88 y.o. male with history of myasthenia gravis on chronic prednisone, dementia, HTN, PAF on Eliquis, HFpEF, prior history of secondary heart block on loop recorder-who recently hospitalized from 03/22/21-03/28/21 at [facility] for syncope/orthostatic hypotension/COVID-19 infection with PNA (treated with-presented back Slo-Bid/Augmentin/doxycycline)-presented to the hospital on 12/30 with worsening cough/fever and generalized weakness-found to have ongoing COVID-19 pneumonitis and subsequently admitted to the hospitalist service.  See below for further details.  Subjective: Lying in bed-awake/alert-he is on room air with O2 saturations in the low 90s.  Spouse at bedside-Per spouse-patient has had worsening weakness in the past when he has been given extra prednisone.  However she does acknowledge that this ongoing weakness started before steroid dosage was escalated during this hospitalization.  Objective: Vitals: Blood pressure 121/65, pulse 70, temperature 97.6 F (36.4 C), temperature source Axillary, resp. rate 15, SpO2 90 %.   Exam: Gen Exam:Alert awake-not in any distress HEENT:atraumatic, normocephalic Chest: B/L clear to auscultation anteriorly CVS:S1S2 regular Abdomen:soft non tender, non distended Extremities:no edema Neurology: Non focal Skin: no rash  Pertinent Labs/Radiology: Recent Labs  Lab 04/04/21 2254 04/06/21 0222 04/09/21 0132  WBC 12.6*   < > 8.6  HGB 14.1   < > 12.8*  PLT 234   < > 202  NA 135   < > 133*  K 4.0   < > 4.2  CREATININE 1.70*   < > 1.35*  AST 29  --   --   ALT 53*  --   --   ALKPHOS 52  --   --   BILITOT 1.0  --   --    < > = values in this interval not displayed.     Assessment/Plan: Pneumonia: Suspicion that this COVID-19 pneumonitis-patient is immunocompromised-and unfortunately has not made any antibodies to COVID-19 in spite of being vaccinated x4.  Prior MD discussed with ID-has been started on Remdesivir-already on steroids-CRP elevated but hypoxia seems to have improved.  Empirically being covered for bacterial PNA-we will complete plan on at least 5 days of Rocephin/doxycycline.  Will need to discuss with ID to see if patient would benefit from [facility] convalescent plasma infusion or to pursue [facility] in the outpatient setting.  Acute hypoxic respiratory failure: Due to above-hypoxia seems to have improved-requiring hardly any oxygen at rest-was on room air.  Suspect will require O2 with ambulation.  No signs of volume overload-low suspicion for VTE as already on anticoagulation with Eliquis.  Near syncope: Per prior notes-Loop recorder interrogated earlier-showed A. fib with PVCs.  No pauses/bradycardia/tachycardia episodes were evident.  History of AV block: Loop recorder in place-continue telemetry monitoring.  Avoid rate limiting agents.  Myasthenia gravis: Not in flare-continue Mestinon-prednisone (dose doubled due to acute illness/COVID-19 pneumonitis).  We will plan on slowly titrating down prednisone-as spouse is concerned that this can aggravate his weakness (although his weakness had already started prior to escalation of his steroid dosage-and is likely from COVID-19/acute illness)  Dementia with delirium: Awake/alert today-but will likely continue to have delirium as long as he is hospitalized and outside of his family surroundings.  Continue Namenda and Aricept.  Debility/deconditioning: Due  to COVID-19 infection/PNA/acute illness-family refusing SNF-we will plan on maximal home health services on discharge.  BMI: Estimated body mass index is 28.98 kg/m as calculated from the following:   Height as of 03/22/21: 5\' 9"  (1.753 m).    Weight as of 03/22/21: 89 kg.    Procedures: None Consults: Cardiology. DVT Prophylaxis: Eliquis Code Status:Full code  Family Communication: None at bedside  Time spent: 35 minutes-Greater than 50% of this time was spent in counseling, explanation of diagnosis, planning of further management, and coordination of care.   Disposition Plan: Status is: Inpatient  Remains inpatient appropriate because: Hypoxia due to ongoing COVID-19 pneumonitis.   Diet: Diet Order             Diet regular Room service appropriate? Yes; Fluid consistency: Thin  Diet effective now                     Antimicrobial agents: Anti-infectives (From admission, onward)    Start     Dose/Rate Route Frequency Ordered Stop   04/09/21 1400  cefTRIAXone (ROCEPHIN) 2 g in sodium chloride 0.9 % 100 mL IVPB        2 g 200 mL/hr over 30 Minutes Intravenous Every 24 hours 04/08/21 1435 04/10/21 1359   04/08/21 1000  remdesivir 100 mg in sodium chloride 0.9 % 100 mL IVPB       See Hyperspace for full Linked Orders Report.   100 mg 200 mL/hr over 30 Minutes Intravenous Daily 04/07/21 1026 04/12/21 0959   04/07/21 1200  remdesivir 200 mg in sodium chloride 0.9% 250 mL IVPB       See Hyperspace for full Linked Orders Report.   200 mg 580 mL/hr over 30 Minutes Intravenous Once 04/07/21 1026 04/07/21 1538   04/05/21 1315  doxycycline (VIBRA-TABS) tablet 100 mg        100 mg Oral Every 12 hours 04/05/21 1311     04/05/21 1245  cefTRIAXone (ROCEPHIN) 2 g in sodium chloride 0.9 % 100 mL IVPB        2 g 200 mL/hr over 30 Minutes Intravenous Every 24 hours 04/05/21 1242 04/08/21 1429   04/05/21 1245  azithromycin (ZITHROMAX) tablet 500 mg  Status:  Discontinued        500 mg Oral Daily 04/05/21 1242 04/05/21 1311        MEDICATIONS: Scheduled Meds:  apixaban  5 mg Oral BID   donepezil  10 mg Oral QHS   doxycycline  100 mg Oral Q12H   feeding supplement  237 mL Oral BID BM   guaiFENesin  600 mg Oral  BID   latanoprost  1 drop Both Eyes QHS   memantine  20 mg Oral QHS   pantoprazole  40 mg Oral Daily   predniSONE  15 mg Oral Q breakfast   pyridostigmine  60 mg Oral BID   Continuous Infusions:  cefTRIAXone (ROCEPHIN)  IV     remdesivir 100 mg in NS 100 mL 100 mg (04/09/21 0903)   PRN Meds:.acetaminophen, albuterol, chlorpheniramine-HYDROcodone, guaiFENesin-dextromethorphan, ipratropium-albuterol, senna-docusate   I have personally reviewed following labs and imaging studies  LABORATORY DATA: CBC: Recent Labs  Lab 04/04/21 2254 04/06/21 0222 04/07/21 0303 04/08/21 0122 04/09/21 0132  WBC 12.6* 11.6* 12.6* 11.1* 8.6  HGB 14.1 14.4 13.9 13.1 12.8*  HCT 41.8 43.8 41.4 37.3* 38.3*  MCV 95.0 96.1 93.9 92.6 93.4  PLT 234 219 208 197 563    Basic Metabolic Panel: Recent Labs  Lab 04/04/21 2254 04/06/21 0222 04/07/21 0303 04/08/21 0122 04/09/21 0132  NA 135 134* 136 133* 133*  K 4.0 4.0 4.2 4.0 4.2  CL 104 105 106 104 105  CO2 23 18* 21* 20* 15*  GLUCOSE 125* 102* 101* 115* 98  BUN 24* 27* 22 26* 37*  CREATININE 1.70* 1.56* 1.38* 1.16 1.35*  CALCIUM 8.2* 8.1* 8.1* 7.9* 8.1*    GFR: CrCl cannot be calculated (Unknown ideal weight.).  Liver Function Tests: Recent Labs  Lab 04/04/21 2254  AST 29  ALT 53*  ALKPHOS 52  BILITOT 1.0  PROT 6.0*  ALBUMIN 2.7*   No results for input(s): LIPASE, AMYLASE in the last 168 hours. No results for input(s): AMMONIA in the last 168 hours.  Coagulation Profile: No results for input(s): INR, PROTIME in the last 168 hours.  Cardiac Enzymes: No results for input(s): CKTOTAL, CKMB, CKMBINDEX, TROPONINI in the last 168 hours.  BNP (last 3 results) No results for input(s): PROBNP in the last 8760 hours.  Lipid Profile: No results for input(s): CHOL, HDL, LDLCALC, TRIG, CHOLHDL, LDLDIRECT in the last 72 hours.  Thyroid Function Tests: No results for input(s): TSH, T4TOTAL, FREET4, T3FREE, THYROIDAB in the last 72  hours.  Anemia Panel: No results for input(s): VITAMINB12, FOLATE, FERRITIN, TIBC, IRON, RETICCTPCT in the last 72 hours.  Urine analysis:    Component Value Date/Time   COLORURINE AMBER (A) 03/22/2021 1954   APPEARANCEUR HAZY (A) 03/22/2021 1954   APPEARANCEUR Clear 01/07/2014 1435   LABSPEC 1.024 03/22/2021 1954   LABSPEC 1.020 01/07/2014 1435   PHURINE 5.0 03/22/2021 1954   GLUCOSEU NEGATIVE 03/22/2021 1954   GLUCOSEU Negative 01/07/2014 1435   HGBUR LARGE (A) 03/22/2021 1954   BILIRUBINUR NEGATIVE 03/22/2021 1954   BILIRUBINUR Negative 01/07/2014 1435   KETONESUR 20 (A) 03/22/2021 1954   PROTEINUR 100 (A) 03/22/2021 1954   NITRITE NEGATIVE 03/22/2021 1954   LEUKOCYTESUR NEGATIVE 03/22/2021 1954   LEUKOCYTESUR Negative 01/07/2014 1435    Sepsis Labs: Lactic Acid, Venous    Component Value Date/Time   LATICACIDVEN 1.5 03/22/2021 2243    MICROBIOLOGY: Recent Results (from the past 240 hour(s))  Resp Panel by RT-PCR (Flu A&B, Covid) Nasopharyngeal Swab     Status: Abnormal   Collection Time: 04/04/21 11:54 PM   Specimen: Nasopharyngeal Swab; Nasopharyngeal(NP) swabs in vial transport medium  Result Value Ref Range Status   SARS Coronavirus 2 by RT PCR POSITIVE (A) NEGATIVE Final    Comment: (NOTE) SARS-CoV-2 target nucleic acids are DETECTED.  The SARS-CoV-2 RNA is generally detectable in upper respiratory specimens during the acute phase of infection. Positive results are indicative of the presence of the identified virus, but do not rule out bacterial infection or co-infection with other pathogens not detected by the test. Clinical correlation with patient history and other diagnostic information is necessary to determine patient infection status. The expected result is Negative.  Fact Sheet for Patients: EntrepreneurPulse.com.au  Fact Sheet for Healthcare Providers: IncredibleEmployment.be  This test is not yet approved or  cleared by the [place] FDA and  has been authorized for detection and/or diagnosis of SARS-CoV-2 by FDA under an Emergency Use Authorization (EUA).  This EUA will remain in effect (meaning this test can be used) for the duration of  the COVID-19 declaration under Section 564(b)(1) of the A ct, 21 U.S.C. section 360bbb-3(b)(1), unless the authorization is terminated or revoked sooner.     Influenza A by PCR NEGATIVE NEGATIVE Final   Influenza  B by PCR NEGATIVE NEGATIVE Final    Comment: (NOTE) The Xpert Xpress SARS-CoV-2/FLU/RSV plus assay is intended as an aid in the diagnosis of influenza from Nasopharyngeal swab specimens and should not be used as a sole basis for treatment. Nasal washings and aspirates are unacceptable for Xpert Xpress SARS-CoV-2/FLU/RSV testing.  Fact Sheet for Patients: EntrepreneurPulse.com.au  Fact Sheet for Healthcare Providers: IncredibleEmployment.be  This test is not yet approved or cleared by the [place] FDA and has been authorized for detection and/or diagnosis of SARS-CoV-2 by FDA under an Emergency Use Authorization (EUA). This EUA will remain in effect (meaning this test can be used) for the duration of the COVID-19 declaration under Section 564(b)(1) of the Act, 21 U.S.C. section 360bbb-3(b)(1), unless the authorization is terminated or revoked.  Performed at [facility], [place] [address], [place], [place] [address]   Culture, blood (routine x 2)     Status: None (Preliminary result)   Collection Time: 04/06/21  2:22 AM   Specimen: BLOOD LEFT ARM  Result Value Ref Range Status   Specimen Description BLOOD LEFT ARM  Final   Special Requests   Final    BOTTLES DRAWN AEROBIC AND ANAEROBIC Blood Culture adequate volume   Culture   Final    NO GROWTH 3 DAYS Performed at [facility], [address], [place], [place] [address]    Report Status PENDING  Incomplete    RADIOLOGY  STUDIES/RESULTS: No results found.   LOS: 4 days   Oren Binet, MD  [facility]    To contact the attending provider between 7A-7P or the covering provider during after hours 7P-7A, please log into the web site [email or web address] and access using universal  password for that web site. If you do not have the password, please call the hospital operator.  04/09/2021, 11:40 AM

## 2021-04-09 NOTE — Progress Notes (Signed)
NIF: Greater than -40 IS: 1.5L  Pt had good effort.

## 2021-04-09 NOTE — Progress Notes (Signed)
SATURATION QUALIFICATIONS: (This note is used to comply with regulatory documentation for home oxygen)  Patient Saturations on Room Air at Rest = 88%  Patient Saturations on Room Air while Ambulating = 85%  Patient Saturations on 5 Liters of oxygen while Ambulating = 93%  Please briefly explain why patient needs home oxygen:  Patient requires oxygen to maintain sats >87% during functional mobility.    Arby Barrette, PT Acute Rehabilitation Services  Pager [phone] Office [phone]

## 2021-04-09 NOTE — Progress Notes (Signed)
Physical Therapy Treatment Patient Details Name: Elijah Moser Sr. MRN: [id number] DOB: 1934-08-22 Today's Date: 04/09/2021   History of Present Illness 87yo male wh presented on 12/30 with persistent cough, fever, and weakness. Of note had recent hospitalization for Covid and DCed home 12/23. Now readmitted with CAP, postviral bacterial pneumonia in LLL. 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 Able to progress OOB with min assist and performed repeated sit to stand for strengthening and balance training. Patient remains on 5L [place] O2 with sats 88-93% throughout session.     Recommendations for follow up therapy are one component of a multi-disciplinary discharge planning process, led by the attending physician.  Recommendations may be updated based on patient status, additional functional criteria and insurance authorization.  Follow Up Recommendations  Home health PT (needs max HHPT services for best outcomes; wife refusing SNF)     Assistance Recommended at Discharge Frequent or constant Supervision/Assistance  Patient can return home with the following Direct supervision/assist for medications management;Direct supervision/assist for financial management;Help with stairs or ramp for entrance;A little help with walking and/or transfers;Assistance with cooking/housework   Equipment Recommendations  Rolling walker (2 wheels);BSC/3in1;Wheelchair (measurements PT);Wheelchair cushion (measurements PT)    Recommendations for Other Services       Precautions / Restrictions Precautions Precautions: Fall;Other (comment) Precaution Comments: myasthenia gravis     Mobility  Bed Mobility Overal bed mobility: Needs Assistance Bed Mobility: Supine to Sit     Supine to sit: Min assist     General bed mobility comments: vc for technique and to use rail     Transfers Overall transfer level: Needs assistance Equipment used: Rolling walker (2 wheels) Transfers: Sit to/from Stand;Bed to chair/wheelchair/BSC Sit to Stand: Min guard     Step pivot transfers: Min guard     General transfer comment: stood from bed x1, from recliner x 4 with no physical assist needed; guarding for safety due to h/o dementia    Ambulation/Gait             Pre-gait activities: marching in place x 2 rounds (~20 steps each) with no loss of balance, but poor foot clearance     Stairs             Wheelchair Mobility    Modified Rankin (Stroke Patients Only)       Balance Overall balance assessment: Needs assistance Sitting-balance support: Feet supported Sitting balance-Leahy Scale: Fair     Standing balance support: No upper extremity supported Standing balance-Leahy Scale: Fair Standing balance comment: stood in front of chair without use of RW x 4 reps                            Cognition Arousal/Alertness: Awake/alert Behavior During Therapy: Flat affect (easily irritated by lines) Overall Cognitive Status: History of cognitive impairments - at baseline                                 General Comments: following commands well        Exercises      General Comments General comments (skin integrity, edema, etc.): Wife present and encouraging pt participation.      Pertinent Vitals/Pain Pain Assessment: No/denies pain    Home Living  Prior Function            PT Goals (current goals can now be found in the care plan section) Acute Rehab PT Goals Patient Stated Goal: to return home Time For Goal Achievement: 04/20/21 Potential to Achieve Goals: Fair Progress towards PT goals: Progressing toward goals    Frequency    Min 3X/week      PT Plan Current plan remains appropriate    Co-evaluation              AM-PAC PT "6 Clicks" Mobility    Outcome Measure  Help needed turning from your back to your side while in a flat bed without using bedrails?: A Little Help needed moving from lying on your back to sitting on the side of a flat bed without using bedrails?: A Little Help needed moving to and from a bed to a chair (including a wheelchair)?: A Little Help needed standing up from a chair using your arms (e.g., wheelchair or bedside chair)?: A Little Help needed to walk in hospital room?: A Little Help needed climbing 3-5 steps with a railing? : A Little 6 Click Score: 18    End of Session Equipment Utilized During Treatment: Oxygen Activity Tolerance: Patient tolerated treatment well Patient left: with call bell/phone within reach;with family/visitor present;in chair;with chair alarm set Nurse Communication: Mobility status PT Visit Diagnosis: Unsteadiness on feet (R26.81);Muscle weakness (generalized) (M62.81)     Time: 1245-8099 PT Time Calculation (min) (ACUTE ONLY): 20 min  Charges:  $Gait Training: 8-22 mins                      Arby Barrette, PT Acute Rehabilitation Services  Pager [phone] Office [phone]    Rexanne Mano 04/09/2021, 10:25 AM

## 2021-04-09 NOTE — Progress Notes (Addendum)
Patient to combative and confused to comply with NIF and VC

## 2021-04-09 NOTE — TOC Progression Note (Signed)
Transition of Care (TOC) - Progression Note    Patient Details  Name: Elijah Torti Sr. MRN: [id number] Date of Birth: 1934/09/08  Transition of Care [facility]) CM/SW [place], LCSW Phone Number: 04/09/2021, 5:28 PM  Clinical Narrative:    CSW left voicemail for patient's spouse, Elijah Jackson.    Expected Discharge Plan: [place] Barriers to Discharge: Continued Medical Work up  Expected Discharge Plan and Services Expected Discharge Plan: [place]   Discharge Planning Services: CM Consult   Living arrangements for the past 2 months: Single Family Home                           HH Arranged: PT, OT [facility] Agency: [place] ([facility]) Date HH Agency Contacted: 04/07/21 Time HH Agency Contacted: [phone] Representative spoke with at [place]: [place] ([place]) Interventions    Readmission Risk Interventions No flowsheet data found.

## 2021-04-09 NOTE — NC FL2 (Signed)
[place] LEVEL OF CARE SCREENING TOOL     IDENTIFICATION  Patient Name: Elijah Jackson. Birthdate: 04/22/1934 Sex: male Admission Date (Current Location): 04/04/2021  [facility] and [place] Number:  Engineering geologist and Address:  [facility] [address], [place], [place] [address]      Provider Number: [id number]  Attending Physician Name and Address:  Jonetta Osgood, MD  Relative Name and Phone Number:       Current Level of Care: Hospital Recommended Level of Care: [place] Prior Approval Number:    Date Approved/Denied:   PASRR Number: [id number]  Discharge Plan: SNF    Current Diagnoses: Patient Active Problem List   Diagnosis Date Noted   CAP (community acquired pneumonia) 04/05/2021   Stage 3a chronic kidney disease ([place])    Orthostatic hypotension    AF (paroxysmal atrial fibrillation) ([place])    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    Dementia without behavioral disturbance, psychotic disturbance, mood disturbance, or anxiety ([place])    Pneumonia due to COVID-19 virus    Syncope and collapse 03/31/2018   Left-sided headache 01/25/2017   OSA (obstructive sleep apnea) 12/24/2016   Diarrhea 12/24/2016   Squamous cell carcinoma 09/07/2016   Cavitating mass of lung 04/17/2016   Gastritis 04/17/2016   Recurrent pulmonary emboli ([place]) 04/15/2016   Essential hypertension 04/14/2016   HLD (hyperlipidemia) 04/14/2016   GERD (gastroesophageal reflux disease) 04/14/2016   Community acquired pneumonia 04/14/2016   Hypokalemia 04/14/2016   Weakness 04/14/2016   Postcholecystectomy diarrhea 09/14/2013   Dizziness and giddiness 08/02/2013   Chronic cholecystitis 01/25/2013   Myasthenia gravis ([place]) 09/08/2012    Orientation RESPIRATION BLADDER Height & Weight     Time, Situation, Place  O2 (Room air to nasal cannula 4-5L and still weaning) Continent, External catheter Weight:   Height:     BEHAVIORAL  SYMPTOMS/MOOD NEUROLOGICAL BOWEL NUTRITION STATUS      Continent Diet (please see dc summary)  AMBULATORY STATUS COMMUNICATION OF NEEDS Skin   Limited Assist Verbally Normal                       Personal Care Assistance Level of Assistance  Bathing, Feeding, Dressing Bathing Assistance: Maximum assistance Feeding assistance: Independent Dressing Assistance: Limited assistance     Functional Limitations Info  Sight Sight Info: Impaired        SPECIAL CARE FACTORS FREQUENCY  PT (By licensed PT), OT (By licensed OT)     PT Frequency: 5x/week OT Frequency: 5x/week            Contractures Contractures Info: Not present    Additional Factors Info  Code Status, Allergies, Psychotropic Code Status Info: Full Allergies Info: Pravastatin, Simvastatin Psychotropic Info: Aricept         Current Medications (04/09/2021):  This is the current hospital active medication list Current Facility-Administered Medications  Medication Dose Route Frequency Provider Last Rate Last Admin   acetaminophen (TYLENOL) tablet 325 mg  325 mg Oral Q6H PRN Wynetta Fines T, MD   325 mg at 04/08/21 2232   albuterol (VENTOLIN HFA) 108 (90 Base) MCG/ACT inhaler 1 puff  1 puff Inhalation Q6H PRN Lequita Halt, MD       apixaban Arne [place]) tablet 5 mg  5 mg Oral BID Wynetta Fines T, MD   5 mg at 04/09/21 0856   benzonatate (TESSALON) capsule 200 mg  200 mg Oral TID PRN Jonetta Osgood, MD  chlorpheniramine-HYDROcodone (TUSSIONEX) 10-8 MG/5ML suspension 5 mL  5 mL Oral Q12H PRN Elgergawy, Silver Huguenin, MD   5 mL at 04/08/21 0946   donepezil (ARICEPT) tablet 10 mg  10 mg Oral QHS Wynetta Fines T, MD   10 mg at 04/08/21 2230   doxycycline (VIBRA-TABS) tablet 100 mg  100 mg Oral Q12H Wynetta Fines T, MD   100 mg at 04/09/21 0856   feeding supplement (ENSURE ENLIVE / ENSURE PLUS) liquid 237 mL  237 mL Oral BID BM Wynetta Fines T, MD   237 mL at 04/09/21 1042   guaiFENesin (MUCINEX) 12 hr tablet 600 mg  600 mg  Oral BID Elgergawy, Silver Huguenin, MD   600 mg at 04/09/21 0856   ipratropium-albuterol (DUONEB) 0.5-2.5 (3) MG/3ML nebulizer solution 3 mL  3 mL Nebulization Q4H PRN Elgergawy, Silver Huguenin, MD       latanoprost (XALATAN) 0.005 % ophthalmic solution 1 drop  1 drop Both Eyes QHS Wynetta Fines T, MD   1 drop at 04/07/21 2245   memantine (NAMENDA) tablet 20 mg  20 mg Oral QHS Wynetta Fines T, MD   20 mg at 04/08/21 2230   pantoprazole (PROTONIX) EC tablet 40 mg  40 mg Oral Daily Wynetta Fines T, MD   40 mg at 04/09/21 0856   predniSONE (DELTASONE) tablet 15 mg  15 mg Oral Q breakfast Wynetta Fines T, MD   15 mg at 04/09/21 0856   pyridostigmine (MESTINON) tablet 60 mg  60 mg Oral BID Wynetta Fines T, MD   60 mg at 04/09/21 3009   remdesivir 100 mg in sodium chloride 0.9 % 100 mL IVPB  100 mg Intravenous Daily Ardyth Harps B, RPH 200 mL/hr at 04/09/21 0903 100 mg at 04/09/21 0903   senna-docusate (Senokot-S) tablet 1 tablet  1 tablet Oral QHS PRN Lequita Halt, MD         Discharge Medications: Please see discharge summary for a list of discharge medications.  Relevant Imaging Results:  Relevant Lab Results:   Additional Information SSN: [id number]. COVID + on 03/22/21  Benard Halsted, LCSW

## 2021-04-10 ENCOUNTER — Inpatient Hospital Stay: Payer: Medicare Other | Admitting: Family Medicine

## 2021-04-10 LAB — CBC
HCT: 36.5 % — ABNORMAL LOW (ref 39.0–52.0)
Hemoglobin: 12.7 g/dL — ABNORMAL LOW (ref 13.0–17.0)
MCH: 32 pg (ref 26.0–34.0)
MCHC: 34.8 g/dL (ref 30.0–36.0)
MCV: 91.9 fL (ref 80.0–100.0)
Platelets: 215 10*3/uL (ref 150–400)
RBC: 3.97 MIL/uL — ABNORMAL LOW (ref 4.22–5.81)
RDW: 13.7 % (ref 11.5–15.5)
WBC: 7 10*3/uL (ref 4.0–10.5)
nRBC: 0 % (ref 0.0–0.2)

## 2021-04-10 LAB — BASIC METABOLIC PANEL
Anion gap: 7 (ref 5–15)
BUN: 32 mg/dL — ABNORMAL HIGH (ref 8–23)
CO2: 19 mmol/L — ABNORMAL LOW (ref 22–32)
Calcium: 8.3 mg/dL — ABNORMAL LOW (ref 8.9–10.3)
Chloride: 108 mmol/L (ref 98–111)
Creatinine, Ser: 1.19 mg/dL (ref 0.61–1.24)
GFR, Estimated: >60 mL/min (ref >60)
Glucose, Bld: 94 mg/dL (ref 70–99)
Potassium: 3.9 mmol/L (ref 3.5–5.1)
Sodium: 134 mmol/L — ABNORMAL LOW (ref 135–145)

## 2021-04-10 LAB — C-REACTIVE PROTEIN
CRP: 16.7 mg/dL — ABNORMAL HIGH (ref <1.0)
CRP: 16.1 mg/dL — ABNORMAL HIGH (ref <1.0)

## 2021-04-10 NOTE — TOC Progression Note (Signed)
Transition of Care (TOC) - Progression Note    Patient Details  Name: Elijah Witucki Sr. MRN: [id number] Date of Birth: 1934/10/31  Transition of Care [facility]) CM/SW [place], LCSW Phone Number: 04/10/2021, 3:55 PM  Clinical Narrative:    CSW spoke with patient's spouse regarding SNF request. She reported that she wanted SNF in [place]. She stated that she and patient's eldest daughter Margreta Journey wanted to meet with CSW in person to discuss plan but that Margreta Journey lives in [place] so would have to schedule a day off to come up. CSW asked if she could do that tomorrow but spouse stated that was too short notice. CSW explained that patient will likely be medically stable soon and may not have time to wait until everyone can meet in person and perhaps it could be done over the phone. Spouse declined and stated they needed meeting to be done in person and that patient would not be ready to discharge because he is still on 15mg  of prednisone and he needed to be titrated slowly. CSW asked if spouse could at least be provided with SNF bed offers to look over and spouse agreed. CSW provided [employer] offers of [place]. She asked if CSW could contact [facility]; CSW asked [facility] to review referral as they have not responded in the system. She scheduled meeting with CSW for Monday at 11:30am.     Expected Discharge Plan: [place] Barriers to Discharge: SNF Covid  Expected Discharge Plan and Services Expected Discharge Plan: [place] In-house Referral: Clinical Social Work Discharge Planning Services: CM Consult Post Acute Care Choice: [place] arrangements for the past 2 months: Single Family Home                           HH Arranged: PT, OT [place] Agency: [place] ([facility]) Date HH Agency Contacted: 04/07/21 Time HH Agency Contacted: 32 Representative spoke with at [place]: [place]  Determinants of Health ([place]) Interventions    Readmission Risk Interventions No flowsheet data found.

## 2021-04-10 NOTE — Progress Notes (Signed)
Pt's Negative Inspiratory Force and Vital Capacity as follows: NIF -35 cm H2O VC 1.10L Patient had good effort and is currently stable. RT will continue to monitor.

## 2021-04-10 NOTE — Progress Notes (Signed)
PROGRESS NOTE        PATIENT DETAILS Name: Elijah Zufall Sr. Age: 87 y.o. Sex: male Date of Birth: 1934/11/03 Admit Date: 04/04/2021 Admitting Physician Lequita Halt, MD VOZ:[id number], Devonne Doughty, DO  Brief Narrative: Patient is a 87 y.o. 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  See below for further details.  Subjective: Slowly improving-no major events overnight.  Spouse at bedside-patient awake alert.  Objective: Vitals: Blood pressure (!) 103/59, pulse 83, temperature 97.8 F (36.6 C), temperature source Oral, resp. rate (!) 21, SpO2 97 %.   Exam: Gen Exam:Alert awake-not in any distress HEENT:atraumatic, normocephalic Chest: B/L clear to auscultation anteriorly CVS:S1S2 regular Abdomen:soft non tender, non distended Extremities:no edema Neurology: Non focal Skin: no rash   Pertinent Labs/Radiology: Recent Labs  Lab 04/04/21 2254 04/06/21 0222 04/10/21 0158  WBC 12.6*   < > 7.0  HGB 14.1   < > 12.7*  PLT 234   < > 215  NA 135   < > 134*  K 4.0   < > 3.9  CREATININE 1.70*   < > 1.19  AST 29  --   --   ALT 53*  --   --   ALKPHOS 52  --   --   BILITOT 1.0  --   --    < > = values in this interval not displayed.     Assessment/Plan: Pneumonia: Suspicion that this COVID-19 pneumonitis-patient is immunocompromised-and unfortunately has not made any antibodies to COVID-19 in spite of being vaccinated x4.  Continue Remdesivir-steroids-CRP now slowly downtrending.  Has  completed a empiric course of Rocephin/doxycycline to cover bacterial PNA.  Since hypoxia improving-suspect we can continue to provide supportive care-we will slowly taper down steroid.  Discussed briefly with ID MD yesterday-Dr. Hendricks Limes that patient use antiviral agents like Paxlovid if he has COVID 19 infection in the future, I have asked the spouse to talk to his primary care practitioner regarding Evushield use for preexposure prophylaxis for  Acute hypoxic respiratory failure: Due to above-hypoxia seems to have improved-either on room air or on minimal amount of oxygen at rest.  Does require O2 with ambulation.  No signs of volume overload.  Low suspicion for VTE as patient already anticoagulated with Eliquis.  Continue to attempt to slowly titrate down FiO2.  Near syncope: Per prior notes-Loop recorder interrogated earlier-showed A. fib with PVCs.  No pauses/bradycardia/tachycardia episodes were evident.  History of AV block: Loop recorder in place-continue telemetry monitoring.  Avoid rate limiting agents.  Myasthenia gravis: Not in flare-continue Mestinon-prednisone (dose doubled due to acute illness/COVID-19 pneumonitis).  We will plan on slowly titrating down prednisone-as spouse is concerned that this can aggravate his weakness (although his weakness had already started prior to escalation of his steroid dosage-and is likely from COVID-19/acute illness)  Dementia with delirium: Awake/alert today-but will likely continue to have delirium as long as he is hospitalized and outside of his family surroundings.  Continue Namenda and Aricept.  Debility/deconditioning: Due to COVID-19 infection/PNA/acute illness-initially family refused SNF-but now  they seem to be well.  Social worker following.  BMI: Estimated body mass index is 28.98 kg/m as calculated from the following:   Height as of 03/22/21: 5\' 9"  (1.753 m).   Weight as of 03/22/21: 89 kg.    Procedures: None Consults:  Cardiology. DVT Prophylaxis: Eliquis Code Status:Full code  Family Communication: None at bedside  Time spent: 25 minutes-Greater than 50% of this time was spent in counseling, explanation of diagnosis, planning of further management, and coordination of care.   Disposition Plan: Status is: Inpatient  Remains inpatient appropriate because: Hypoxia due to ongoing COVID-19 pneumonitis.  Still on IV Remdesivir-SNF eventually when closer to discharge.  Will need 21 days of isolation given immunocompromise status.   Diet: Diet Order             Diet regular Room service appropriate? Yes; Fluid consistency: Thin  Diet effective now                     Antimicrobial agents: Anti-infectives (From admission, onward)    Start     Dose/Rate Route Frequency Ordered Stop   04/09/21 1400  cefTRIAXone (ROCEPHIN) 2 g in sodium chloride 0.9 % 100 mL IVPB        2 g 200 mL/hr over 30 Minutes Intravenous Every 24 hours 04/08/21 1435 04/09/21 1323   04/08/21 1000  remdesivir 100 mg in sodium chloride 0.9 % 100 mL IVPB       See Hyperspace for full Linked Orders Report.   100 mg 200 mL/hr over 30 Minutes Intravenous Daily 04/07/21 1026 04/12/21 0959   04/07/21 1200  remdesivir 200 mg in sodium chloride 0.9% 250 mL IVPB       See Hyperspace for full Linked Orders Report.   200 mg 580 mL/hr over 30 Minutes Intravenous Once 04/07/21 1026 04/07/21 1538   04/05/21 1315  doxycycline (VIBRA-TABS) tablet 100 mg        100 mg Oral Every 12 hours 04/05/21 1311     04/05/21 1245  cefTRIAXone (ROCEPHIN) 2 g in sodium chloride 0.9 % 100 mL IVPB        2 g 200 mL/hr over 30 Minutes Intravenous Every 24 hours 04/05/21 1242 04/09/21 1242   04/05/21 1245  azithromycin (ZITHROMAX) tablet 500 mg  Status:  Discontinued        500 mg Oral Daily 04/05/21 1242 04/05/21 1311        MEDICATIONS: Scheduled Meds:  apixaban  5 mg Oral BID   donepezil  10 mg Oral QHS   doxycycline  100 mg Oral Q12H    feeding supplement  237 mL Oral BID BM   guaiFENesin  600 mg Oral BID   latanoprost  1 drop Both Eyes QHS   memantine  20 mg Oral QHS   pantoprazole  40 mg Oral Daily   predniSONE  15 mg Oral Q breakfast   pyridostigmine  60 mg Oral BID   Continuous Infusions:  remdesivir 100 mg in NS 100 mL 100 mg (04/10/21 0850)   PRN Meds:.acetaminophen, albuterol, benzonatate, chlorpheniramine-HYDROcodone, ipratropium-albuterol, senna-docusate   I have personally reviewed following labs and imaging studies  LABORATORY DATA: CBC: Recent Labs  Lab 04/06/21 0222 04/07/21 0303 04/08/21 0122 04/09/21 0132 04/10/21 0158  WBC 11.6* 12.6* 11.1* 8.6 7.0  HGB 14.4 13.9 13.1 12.8* 12.7*  HCT 43.8 41.4 37.3* 38.3* 36.5*  MCV 96.1 93.9 92.6 93.4 91.9  PLT 219 208 197 202 215  Basic Metabolic Panel: Recent Labs  Lab 04/06/21 0222 04/07/21 0303 04/08/21 0122 04/09/21 0132 04/10/21 0158  NA 134* 136 133* 133* 134*  K 4.0 4.2 4.0 4.2 3.9  CL 105 106 104 105 108  CO2 18* 21* 20* 15* 19*  GLUCOSE 102* 101* 115* 98 94  BUN 27* 22 26* 37* 32*  CREATININE 1.56* 1.38* 1.16 1.35* 1.19  CALCIUM 8.1* 8.1* 7.9* 8.1* 8.3*     GFR: CrCl cannot be calculated (Unknown ideal weight.).  Liver Function Tests: Recent Labs  Lab 04/04/21 2254  AST 29  ALT 53*  ALKPHOS 52  BILITOT 1.0  PROT 6.0*  ALBUMIN 2.7*    No results for input(s): LIPASE, AMYLASE in the last 168 hours. No results for input(s): AMMONIA in the last 168 hours.  Coagulation Profile: No results for input(s): INR, PROTIME in the last 168 hours.  Cardiac Enzymes: No results for input(s): CKTOTAL, CKMB, CKMBINDEX, TROPONINI in the last 168 hours.  BNP (last 3 results) No results for input(s): PROBNP in the last 8760 hours.  Lipid Profile: No results for input(s): CHOL, HDL, LDLCALC, TRIG, CHOLHDL, LDLDIRECT in the last 72 hours.  Thyroid Function Tests: No results for input(s): TSH, T4TOTAL, FREET4, T3FREE, THYROIDAB  in the last 72 hours.  Anemia Panel: No results for input(s): VITAMINB12, FOLATE, FERRITIN, TIBC, IRON, RETICCTPCT in the last 72 hours.  Urine analysis:    Component Value Date/Time   COLORURINE AMBER (A) 03/22/2021 1954   APPEARANCEUR HAZY (A) 03/22/2021 1954   APPEARANCEUR Clear 01/07/2014 1435   LABSPEC 1.024 03/22/2021 1954   LABSPEC 1.020 01/07/2014 1435   PHURINE 5.0 03/22/2021 1954   GLUCOSEU NEGATIVE 03/22/2021 1954   GLUCOSEU Negative 01/07/2014 1435   HGBUR LARGE (A) 03/22/2021 1954   BILIRUBINUR NEGATIVE 03/22/2021 1954   BILIRUBINUR Negative 01/07/2014 1435   KETONESUR 20 (A) 03/22/2021 1954   PROTEINUR 100 (A) 03/22/2021 1954   NITRITE NEGATIVE 03/22/2021 1954   LEUKOCYTESUR NEGATIVE 03/22/2021 1954   LEUKOCYTESUR Negative 01/07/2014 1435    Sepsis Labs: Lactic Acid, Venous    Component Value Date/Time   LATICACIDVEN 1.5 03/22/2021 2243    MICROBIOLOGY: Recent Results (from the past 240 hour(s))  Resp Panel by RT-PCR (Flu A&B, Covid) Nasopharyngeal Swab     Status: Abnormal   Collection Time: 04/04/21 11:54 PM   Specimen: Nasopharyngeal Swab; Nasopharyngeal(NP) swabs in vial transport medium  Result Value Ref Range Status   SARS Coronavirus 2 by RT PCR POSITIVE (A) NEGATIVE Final    Comment: (NOTE) SARS-CoV-2 target nucleic acids are DETECTED.  The SARS-CoV-2 RNA is generally detectable in upper respiratory specimens during the acute phase of infection. Positive results are indicative of the presence of the identified virus, but do not rule out bacterial infection or co-infection with other pathogens not detected by the test. Clinical correlation with patient history and other diagnostic information is necessary to determine patient infection status. The expected result is Negative.  Fact Sheet for Patients: EntrepreneurPulse.com.au  Fact Sheet for Healthcare Providers: IncredibleEmployment.be  This test is not  yet approved or cleared by the [place] FDA and  has been authorized for detection and/or diagnosis of SARS-CoV-2 by FDA under an Emergency Use Authorization (EUA).  This EUA will remain in effect (meaning this test can be used) for the duration of  the COVID-19 declaration under Section 564(b)(1) of the A ct, 21 U.S.C. section 360bbb-3(b)(1), unless the authorization is terminated or revoked sooner.     Influenza A  by PCR NEGATIVE NEGATIVE Final   Influenza B by PCR NEGATIVE NEGATIVE Final    Comment: (NOTE) The Xpert Xpress SARS-CoV-2/FLU/RSV plus assay is intended as an aid in the diagnosis of influenza from Nasopharyngeal swab specimens and should not be used as a sole basis for treatment. Nasal washings and aspirates are unacceptable for Xpert Xpress SARS-CoV-2/FLU/RSV testing.  Fact Sheet for Patients: EntrepreneurPulse.com.au  Fact Sheet for Healthcare Providers: IncredibleEmployment.be  This test is not yet approved or cleared by the [place] FDA and has been authorized for detection and/or diagnosis of SARS-CoV-2 by FDA under an Emergency Use Authorization (EUA). This EUA will remain in effect (meaning this test can be used) for the duration of the COVID-19 declaration under Section 564(b)(1) of the Act, 21 U.S.C. section 360bbb-3(b)(1), unless the authorization is terminated or revoked.  Performed at [facility], [place] [address], [place], [place] [address]   Culture, blood (routine x 2)     Status: None (Preliminary result)   Collection Time: 04/06/21  2:22 AM   Specimen: BLOOD LEFT ARM  Result Value Ref Range Status   Specimen Description BLOOD LEFT ARM  Final   Special Requests   Final    BOTTLES DRAWN AEROBIC AND ANAEROBIC Blood Culture adequate volume   Culture   Final    NO GROWTH 4 DAYS Performed at [facility], [place] [address] [place], [place] [address]    Report Status PENDING  Incomplete     RADIOLOGY STUDIES/RESULTS: No results found.   LOS: 5 days   Oren Binet, MD  [facility]    To contact the attending provider between 7A-7P or the covering provider during after hours 7P-7A, please log into the web site [email or web address] and access using universal [place] password for that web site. If you do not have the password, please call the hospital operator.  04/10/2021, 12:10 PM

## 2021-04-10 NOTE — Progress Notes (Signed)
Occupational Therapy Treatment Patient Details Name: Elijah Weigelt Sr. MRN: [id number] DOB: April 15, 1934 Today's Date: 04/10/2021   History of present illness 87yo male wh presented on 12/30 with persistent cough, fever, and weakness. Of note had recent hospitalization for Covid and DCed home 12/23. Now readmitted with CAP, postviral bacterial pneumonia in LLL. PMH myasthenia gravis, advanced dementia, HTN, PAF, CHF, CKD, 2nd degree AV block on loop recorder, Covid, presyncope   OT comments  Patient received in supine and agreeable to OT session. Patient agreed to get out of bed and address grooming at sink. Patient was min assist to get to EOB and asked for rest before attempting mobility. Patient asked to attempt to stand and patient stated he felt too weak to stand.  Patient asked to perform grooming seated on EOB and he stated he was too weak to do anything.  Patient sat on EOB for 10+ minutes and would not progress from EOB or perform tasks.  Patient returned to supine with min assist. Acute OT to continue to follow.    Recommendations for follow up therapy are one component of a multi-disciplinary discharge planning process, led by the attending physician.  Recommendations may be updated based on patient status, additional functional criteria and insurance authorization.    Follow Up Recommendations  Home health OT    Assistance Recommended at Discharge Frequent or constant Supervision/Assistance  Patient can return home with the following  A lot of help with walking and/or transfers;A lot of help with bathing/dressing/bathroom;Assistance with cooking/housework;Direct supervision/assist for medications management;Direct supervision/assist for financial management;Assist for transportation;Help with stairs or ramp for entrance   Equipment Recommendations  Wheelchair (measurements OT)    Recommendations for Other Services      Precautions / Restrictions Precautions Precautions:  Fall Precaution Comments: myasthenia gravis Restrictions Weight Bearing Restrictions: No       Mobility Bed Mobility Overal bed mobility: Needs Assistance Bed Mobility: Supine to Sit;Sit to Supine     Supine to sit: Min assist Sit to supine: Min assist   General bed mobility comments: vc for technique and to use rail    Transfers                   General transfer comment: refused to stand from EOB     Balance Overall balance assessment: Needs assistance Sitting-balance support: Feet supported;Bilateral upper extremity supported Sitting balance-Leahy Scale: Fair Sitting balance - Comments: sat on EOB with BUE support       Standing balance comment: refused to stand                           ADL either performed or assessed with clinical judgement   ADL                                              Extremity/Trunk Assessment              Vision       Perception     Praxis      Cognition Arousal/Alertness: Awake/alert Behavior During Therapy: Flat affect Overall Cognitive Status: History of cognitive impairments - at baseline                                 General Comments:  refused to progress from EOB          Exercises     Shoulder Instructions       General Comments O2 90-94 while sitting on EOB with 5 liters    Pertinent Vitals/ Pain       Pain Assessment: No/denies pain Faces Pain Scale: No hurt  Home Living                                          Prior Functioning/Environment              Frequency  Min 2X/week        Progress Toward Goals  OT Goals(current goals can now be found in the care plan section)  Progress towards OT goals: Not progressing toward goals - comment (only performed bed mobilty and sitting balance on EOB)  Acute Rehab OT Goals Patient Stated Goal: get stronger OT Goal Formulation: With patient Time For Goal Achievement:  04/20/21 Potential to Achieve Goals: Good ADL Goals Pt Will Perform Grooming: with supervision;standing Pt Will Perform Upper Body Dressing: with set-up;sitting Pt Will Perform Lower Body Dressing: with set-up;with supervision;sit to/from stand Pt Will Transfer to Toilet: with supervision;ambulating  Plan Discharge plan needs to be updated;Frequency remains appropriate    Co-evaluation                 AM-PAC OT "6 Clicks" Daily Activity     Outcome Measure   Help from another person eating meals?: A Little Help from another person taking care of personal grooming?: A Little Help from another person toileting, which includes using toliet, bedpan, or urinal?: A Lot Help from another person bathing (including washing, rinsing, drying)?: A Lot Help from another person to put on and taking off regular upper body clothing?: A Little Help from another person to put on and taking off regular lower body clothing?: A Lot 6 Click Score: 15    End of Session Equipment Utilized During Treatment: Rolling walker (2 wheels);Gait belt;Oxygen  OT Visit Diagnosis: Unsteadiness on feet (R26.81);Other abnormalities of gait and mobility (R26.89);Muscle weakness (generalized) (M62.81);Pain   Activity Tolerance Patient limited by fatigue   Patient Left in chair;with call bell/phone within reach;with bed alarm set   Nurse Communication Mobility status;Other (comment) (informed nursing that patient would not progress from EOB)        Time: 6720-9470 OT Time Calculation (min): 27 min  Charges: OT General Charges $OT Visit: 1 Visit OT Treatments $Therapeutic Activity: 23-37 mins  Lodema Hong, OTA Acute Rehabilitation Services  Pager [phone] Office [place] 04/10/2021, 1:20 PM

## 2021-04-10 NOTE — Progress Notes (Signed)
Pt refused to perform NIF and VC tonight

## 2021-04-11 LAB — C-REACTIVE PROTEIN: CRP: 11.5 mg/dL — ABNORMAL HIGH (ref <1.0)

## 2021-04-11 LAB — BASIC METABOLIC PANEL
Anion gap: 8 (ref 5–15)
BUN: 30 mg/dL — ABNORMAL HIGH (ref 8–23)
CO2: 19 mmol/L — ABNORMAL LOW (ref 22–32)
Calcium: 8.2 mg/dL — ABNORMAL LOW (ref 8.9–10.3)
Chloride: 108 mmol/L (ref 98–111)
Creatinine, Ser: 1.14 mg/dL (ref 0.61–1.24)
GFR, Estimated: >60 mL/min (ref >60)
Glucose, Bld: 152 mg/dL — ABNORMAL HIGH (ref 70–99)
Potassium: 3.7 mmol/L (ref 3.5–5.1)
Sodium: 135 mmol/L (ref 135–145)

## 2021-04-11 LAB — CULTURE, BLOOD (ROUTINE X 2)
Culture: NO GROWTH
Special Requests: ADEQUATE

## 2021-04-11 MED ORDER — PREDNISONE 5 MG PO TABS
10.0000 mg | ORAL_TABLET | Freq: Every day | ORAL | Status: DC
  Administered 2021-04-12: 10 mg via ORAL
  Filled 2021-04-11: qty 2

## 2021-04-11 NOTE — Progress Notes (Signed)
NIF: -40 VC: 1.5L  Pt had good effort

## 2021-04-11 NOTE — TOC Progression Note (Addendum)
Transition of Care (TOC) - Progression Note    Patient Details  Name: Elijah Gosnell Sr. MRN: [id number] Date of Birth: 1934-05-24  Transition of Care [facility]) CM/SW [place], LCSW Phone Number: 04/11/2021, 8:51 AM  Clinical Narrative:    Per [facility], patient does not appear to be participating in therapy efforts. Patient to be seen by PT again today and hopefully will participate. CSW faxed referral to [place] [phone]) for SNF authorization in the event that [facility] Medicare does not approve SNF stay.   Update: [facility] contacted CSW back and stated that patient is not approved for SNF through the [place] and will have to use his Medicare benefits.    Expected Discharge Plan: Skilled Nursing Facility Barriers to Discharge: SNF Covid  Expected Discharge Plan and Services Expected Discharge Plan: [place] In-house Referral: Clinical Social Work Discharge Planning Services: CM Consult Post Acute Care Choice: [place] arrangements for the past 2 months: [place]: PT, OT [place] Agency: [place] ([facility]) Date HH Agency Contacted: 04/07/21 Time HH Agency Contacted: 25 Representative spoke with at [place]: [place] ([place]) Interventions    Readmission Risk Interventions No flowsheet data found.

## 2021-04-11 NOTE — Progress Notes (Signed)
Physical Therapy Treatment Patient Details Name: Elijah Avey Sr. MRN: [id number] DOB: January 16, 1935 Today's Date: 04/11/2021   History of Present Illness 87yo male wh presented on 12/30 with persistent cough, fever, and weakness. Of note had recent hospitalization for Covid and DCed home 12/23. Now readmitted with CAP, postviral bacterial pneumonia in LLL. PMH myasthenia gravis, advanced dementia, HTN, PAF, CHF, CKD, 2nd degree AV block on loop recorder, Covid, presyncope    PT Comments    Pt shows steady improvement toward his goals.  Emphasis on transitions to EOB, scooting, sit to stand, progression of gait with low restrictive AD.    Recommendations for follow up therapy are one component of a multi-disciplinary discharge planning process, led by the attending physician.  Recommendations may be updated based on patient status, additional functional criteria and insurance authorization.  Follow Up Recommendations  Other (comment)     Assistance Recommended at Discharge Frequent or constant Supervision/Assistance (HHPT if family can provide minimal assist at times)  Patient can return home with the following A little help with walking and/or transfers;A little help with bathing/dressing/bathroom   Equipment Recommendations       Recommendations for Other Services       Precautions / Restrictions Precautions Precautions: Fall Precaution Comments: myasthenia gravis     Mobility  Bed Mobility   Bed Mobility: Supine to Sit;Sit to Supine     Supine to sit: Min guard Sit to supine: Min assist   General bed mobility comments: pt came up without assist from flat bed, scooting to the EOB    Transfers Overall transfer level: Needs assistance   Transfers: Sit to/from Stand Sit to Stand: Min guard;Min assist (min first trial and then after min guard)                Ambulation/Gait Ambulation/Gait assistance: Min guard;Min assist Gait Distance (Feet): 80 Feet (around  the room, then 30 feet after sitting rest.) Assistive device: IV Pole Gait Pattern/deviations: Step-through pattern Gait velocity: decreased Gait velocity interpretation: <1.8 ft/sec, indicate of risk for recurrent falls   General Gait Details: mildly unsteady and tentative, improving with time up.  Needed the IV pole for safety.   Stairs             Wheelchair Mobility    Modified Rankin (Stroke Patients Only)       Balance   Sitting-balance support: Feet supported;No upper extremity supported Sitting balance-Leahy Scale: Fair       Standing balance-Leahy Scale: Fair                              Cognition Arousal/Alertness: Awake/alert Behavior During Therapy: Flat affect;WFL for tasks assessed/performed Overall Cognitive Status: History of cognitive impairments - at baseline                                          Exercises      General Comments General comments (skin integrity, edema, etc.): HR 85 bpm whether with/without O2 at 3L, During activity, SpO2 slowly dropped to 86% on RA and up to about 93% on 3L       Pertinent Vitals/Pain Pain Assessment: Faces Faces Pain Scale: Hurts a little bit Pain Location: back Pain Descriptors / Indicators: Discomfort Pain Intervention(s): Monitored during session    Home Living  Prior Function            PT Goals (current goals can now be found in the care plan section) Acute Rehab PT Goals Patient Stated Goal: to return home PT Goal Formulation: With patient/family Time For Goal Achievement: 04/20/21 Potential to Achieve Goals: Fair Progress towards PT goals: Progressing toward goals    Frequency    Min 3X/week      PT Plan Current plan remains appropriate    Co-evaluation              AM-PAC PT "6 Clicks" Mobility   Outcome Measure  Help needed turning from your back to your side while in a flat bed without using  bedrails?: A Little Help needed moving from lying on your back to sitting on the side of a flat bed without using bedrails?: A Little Help needed moving to and from a bed to a chair (including a wheelchair)?: A Little Help needed standing up from a chair using your arms (e.g., wheelchair or bedside chair)?: A Little Help needed to walk in hospital room?: A Little Help needed climbing 3-5 steps with a railing? : A Lot 6 Click Score: 17    End of Session Equipment Utilized During Treatment: Oxygen Activity Tolerance: Patient tolerated treatment well Patient left: in bed;with call bell/phone within reach;with bed alarm set Nurse Communication: Mobility status PT Visit Diagnosis: Unsteadiness on feet (R26.81);Muscle weakness (generalized) (M62.81);Other abnormalities of gait and mobility (R26.89)     Time: 9470-9628 PT Time Calculation (min) (ACUTE ONLY): 40 min  Charges:  $Therapeutic Activity: 23-37 mins                     04/11/2021  Ginger Carne., PT Acute Rehabilitation Services [phone]  (pager) [phone]  (office)   Tessie Fass Zailah Zagami 04/11/2021, 6:15 PM

## 2021-04-11 NOTE — Progress Notes (Addendum)
PROGRESS NOTE        PATIENT DETAILS Name: Elijah Gingerich Sr. Age: 87 y.o. Sex: male Date of Birth: Jul 11, 1934 Admit Date: 04/04/2021 Admitting Physician Lequita Halt, MD [id number], Devonne Doughty, DO  Brief Narrative: Patient is a 87 y.o. 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  See below for further details.  Subjective: No major issues overnight-lying comfortably in bed.  Stable on 2 L of oxygen this morning.  Objective: Vitals: Blood pressure 122/70, pulse 74, temperature 99 F (37.2 C), temperature source Axillary, resp. rate (!) 23, SpO2 95 %.   Exam: Gen Exam:Alert awake-not in any distress HEENT:atraumatic, normocephalic Chest: Few bibasilar rales. CVS:S1S2 regular Abdomen:soft non tender, non distended Extremities:no edema Neurology: Non focal Skin: no rash   Pertinent Labs/Radiology: Recent Labs  Lab 04/04/21 2254 04/06/21 0222 04/10/21 0158 04/11/21 0135  WBC 12.6*   < > 7.0  --   HGB 14.1   < > 12.7*  --   PLT 234   < > 215  --   NA 135   < > 134* 135  K 4.0   < > 3.9 3.7  CREATININE 1.70*   < > 1.19 1.14  AST 29  --   --   --   ALT 53*  --   --   --   ALKPHOS 52  --   --   --   BILITOT 1.0  --   --   --    < > = values in this interval not displayed.     Assessment/Plan: Acute hypoxic respiratory failure likely due to COVID-19 pneumonitis: Patient is immunocompromised-but suspicion is that this is COVID-19 pneumonitis given the fact that patient has  not made any COVID-19 antibodies in spite of being vaccinated x4.  Has improved-with minimal exertion requirement.  Has completed a course of broad-spectrum antibiotics- will complete Remdesivir on 1/6.  We will begin titrating prednisone today.   Discussed briefly with ID MD on 1/4-Dr. Lucianne Lei Dam-recommends that patient use antiviral agents like Paxlovid if he has COVID 19 infection in the future, I have asked the spouse to talk to his primary care practitioner regarding Evushield use for preexposure prophylaxis    Near syncope: Per prior notes-Loop recorder interrogated earlier-showed A. fib with PVCs.  No pauses/bradycardia/tachycardia episodes were evident.  History of AV block: Loop recorder in place-continue telemetry monitoring.  Avoid rate limiting agents.  Myasthenia gravis: Not in flare-continue Mestinon-prednisone (dose doubled due to acute illness/COVID-19 pneumonitis).  Since COVID-19 pneumonitis/hypoxia have overall improved-begin to decrease prednisone.  A few days earlier-patient's spouse was concerned that steroid could aggravate his weakness (although weakness is already started prior to steroid dosage being escalated)  Dementia with delirium: Awake/alert today-but will likely continue to have delirium as long as he is hospitalized and outside of his family surroundings.  Continue Namenda and Aricept.  Debility/deconditioning: Due to COVID-19 infection/PNA/acute illness-initially family refused SNF-but now they seem to be well.  Social worker following.  BMI: Estimated body mass index is 28.98 kg/m  as calculated from the following:   Height as of 03/22/21: 5\' 9"  (1.753 m).   Weight as of 03/22/21: 89 kg.    Procedures: None Consults: Cardiology. DVT Prophylaxis: Eliquis Code Status:Full code  Family Communication: Spouse at bedside  Time spent: 22 minutes-Greater than 50% of this time was spent in counseling, explanation of diagnosis, planning of further management, and  coordination of care.   Disposition Plan: Status is: Inpatient  Remains inpatient appropriate because: Hypoxia due to ongoing COVID-19 pneumonitis.  Still on IV Remdesivir-SNF eventually when closer to discharge.  Will need 21 days of isolation given immunocompromise status.   Diet: Diet Order             Diet regular Room service appropriate? Yes; Fluid consistency: Thin  Diet effective now                     Antimicrobial agents: Anti-infectives (From admission, onward)    Start     Dose/Rate Route Frequency Ordered Stop   04/09/21 1400  cefTRIAXone (ROCEPHIN) 2 g in sodium chloride 0.9 % 100 mL IVPB        2 g 200 mL/hr over 30 Minutes Intravenous Every 24 hours 04/08/21 1435 04/09/21 1323   04/08/21 1000  remdesivir 100 mg in sodium chloride 0.9 % 100 mL IVPB       See Hyperspace for full Linked Orders Report.   100 mg 200 mL/hr over 30 Minutes Intravenous Daily 04/07/21 1026 04/11/21 0926   04/07/21 1200  remdesivir 200 mg in sodium chloride 0.9% 250 mL IVPB       See Hyperspace for full Linked Orders Report.   200 mg 580 mL/hr over 30 Minutes Intravenous Once 04/07/21 1026 04/07/21 1538   04/05/21 1315  doxycycline (VIBRA-TABS) tablet 100 mg  Status:  Discontinued        100 mg Oral Every 12 hours 04/05/21 1311 04/10/21 1211   04/05/21 1245  cefTRIAXone (ROCEPHIN) 2 g in sodium chloride 0.9 % 100 mL IVPB        2 g 200 mL/hr over 30 Minutes Intravenous Every 24 hours 04/05/21 1242 04/09/21 1242   04/05/21 1245  azithromycin (ZITHROMAX) tablet 500 mg  Status:  Discontinued        500 mg Oral Daily 04/05/21 1242 04/05/21 1311        MEDICATIONS: Scheduled Meds:  apixaban  5 mg Oral BID   donepezil  10 mg Oral QHS   feeding supplement  237 mL Oral BID BM   guaiFENesin  600 mg Oral BID   latanoprost  1 drop Both Eyes QHS   memantine  20 mg Oral QHS   pantoprazole  40 mg Oral Daily   predniSONE  15 mg Oral Q breakfast   pyridostigmine  60 mg Oral BID    Continuous Infusions:   PRN Meds:.acetaminophen, albuterol, benzonatate, chlorpheniramine-HYDROcodone, ipratropium-albuterol, senna-docusate   I have personally reviewed following labs and imaging studies  LABORATORY DATA: CBC: Recent Labs  Lab 04/06/21 0222 04/07/21 0303 04/08/21 0122 04/09/21 0132 04/10/21 0158  WBC 11.6* 12.6* 11.1* 8.6 7.0  HGB 14.4 13.9 13.1 12.8* 12.7*  HCT 43.8 41.4 37.3* 38.3* 36.5*  MCV 96.1 93.9 92.6 93.4 91.9  PLT 219 208 197 202 215     Basic Metabolic Panel: Recent Labs  Lab 04/07/21 0303 04/08/21 0122 04/09/21 0132 04/10/21 0158 04/11/21 0135  NA 136 133* 133* 134* 135  K 4.2 4.0 4.2 3.9 3.7  CL  106 104 105 108 108  CO2 21* 20* 15* 19* 19*  GLUCOSE 101* 115* 98 94 152*  BUN 22 26* 37* 32* 30*  CREATININE 1.38* 1.16 1.35* 1.19 1.14  CALCIUM 8.1* 7.9* 8.1* 8.3* 8.2*     GFR: CrCl cannot be calculated (Unknown ideal weight.).  Liver Function Tests: Recent Labs  Lab 04/04/21 2254  AST 29  ALT 53*  ALKPHOS 52  BILITOT 1.0  PROT 6.0*  ALBUMIN 2.7*    No results for input(s): LIPASE, AMYLASE in the last 168 hours. No results for input(s): AMMONIA in the last 168 hours.  Coagulation Profile: No results for input(s): INR, PROTIME in the last 168 hours.  Cardiac Enzymes: No results for input(s): CKTOTAL, CKMB, CKMBINDEX, TROPONINI in the last 168 hours.  BNP (last 3 results) No results for input(s): PROBNP in the last 8760 hours.  Lipid Profile: No results for input(s): CHOL, HDL, LDLCALC, TRIG, CHOLHDL, LDLDIRECT in the last 72 hours.  Thyroid Function Tests: No results for input(s): TSH, T4TOTAL, FREET4, T3FREE, THYROIDAB in the last 72 hours.  Anemia Panel: No results for input(s): VITAMINB12, FOLATE, FERRITIN, TIBC, IRON, RETICCTPCT in the last 72 hours.  Urine analysis:    Component Value Date/Time   COLORURINE AMBER (A) 03/22/2021 1954   APPEARANCEUR HAZY (A) 03/22/2021 1954   APPEARANCEUR Clear  01/07/2014 1435   LABSPEC 1.024 03/22/2021 1954   LABSPEC 1.020 01/07/2014 1435   PHURINE 5.0 03/22/2021 1954   GLUCOSEU NEGATIVE 03/22/2021 1954   GLUCOSEU Negative 01/07/2014 1435   HGBUR LARGE (A) 03/22/2021 1954   BILIRUBINUR NEGATIVE 03/22/2021 1954   BILIRUBINUR Negative 01/07/2014 1435   KETONESUR 20 (A) 03/22/2021 1954   PROTEINUR 100 (A) 03/22/2021 1954   NITRITE NEGATIVE 03/22/2021 1954   LEUKOCYTESUR NEGATIVE 03/22/2021 1954   LEUKOCYTESUR Negative 01/07/2014 1435    Sepsis Labs: Lactic Acid, Venous    Component Value Date/Time   LATICACIDVEN 1.5 03/22/2021 2243    MICROBIOLOGY: Recent Results (from the past 240 hour(s))  Resp Panel by RT-PCR (Flu A&B, Covid) Nasopharyngeal Swab     Status: Abnormal   Collection Time: 04/04/21 11:54 PM   Specimen: Nasopharyngeal Swab; Nasopharyngeal(NP) swabs in vial transport medium  Result Value Ref Range Status   SARS Coronavirus 2 by RT PCR POSITIVE (A) NEGATIVE Final    Comment: (NOTE) SARS-CoV-2 target nucleic acids are DETECTED.  The SARS-CoV-2 RNA is generally detectable in upper respiratory specimens during the acute phase of infection. Positive results are indicative of the presence of the identified virus, but do not rule out bacterial infection or co-infection with other pathogens not detected by the test. Clinical correlation with patient history and other diagnostic information is necessary to determine patient infection status. The expected result is Negative.  Fact Sheet for Patients: EntrepreneurPulse.com.au  Fact Sheet for Healthcare Providers: IncredibleEmployment.be  This test is not yet approved or cleared by the [place] FDA and  has been authorized for detection and/or diagnosis of SARS-CoV-2 by FDA under an Emergency Use Authorization (EUA).  This EUA will remain in effect (meaning this test can be used) for the duration of  the COVID-19 declaration under  Section 564(b)(1) of the A ct, 21 U.S.C. section 360bbb-3(b)(1), unless the authorization is terminated or revoked sooner.     Influenza A by PCR NEGATIVE NEGATIVE Final   Influenza B by PCR NEGATIVE NEGATIVE Final    Comment: (NOTE) The Xpert Xpress SARS-CoV-2/FLU/RSV plus assay is intended as an aid in the diagnosis  of influenza from Nasopharyngeal swab specimens and should not be used as a sole basis for treatment. Nasal washings and aspirates are unacceptable for Xpert Xpress SARS-CoV-2/FLU/RSV testing.  Fact Sheet for Patients: EntrepreneurPulse.com.au  Fact Sheet for Healthcare Providers: IncredibleEmployment.be  This test is not yet approved or cleared by the [place] FDA and has been authorized for detection and/or diagnosis of SARS-CoV-2 by FDA under an Emergency Use Authorization (EUA). This EUA will remain in effect (meaning this test can be used) for the duration of the COVID-19 declaration under Section 564(b)(1) of the Act, 21 U.S.C. section 360bbb-3(b)(1), unless the authorization is terminated or revoked.  Performed at [facility], [place] [address] [place], [place] [address]   Culture, blood (routine x 2)     Status: None   Collection Time: 04/06/21  2:22 AM   Specimen: BLOOD LEFT ARM  Result Value Ref Range Status   Specimen Description BLOOD LEFT ARM  Final   Special Requests   Final    BOTTLES DRAWN AEROBIC AND ANAEROBIC Blood Culture adequate volume   Culture   Final    NO GROWTH 5 DAYS Performed at [facility], [address], [place], [place] [address]    Report Status 04/11/2021 FINAL  Final    RADIOLOGY STUDIES/RESULTS: No results found.   LOS: 6 days   Oren Binet, MD  [facility]    To contact the attending provider between 7A-7P or the covering provider during after hours 7P-7A, please log into the web site [email or web address] and access using universal [place] password for  that web site. If you do not have the password, please call the hospital operator.  04/11/2021, 11:32 AM

## 2021-04-11 NOTE — Progress Notes (Signed)
Pt Negative Inspiratory Force and Vital Capacity as follows: NIF: -22 cmH2O VC: 1.08L Good effort and pt is currently stable.RT will continue to monitor.

## 2021-04-12 LAB — C-REACTIVE PROTEIN: CRP: 10.6 mg/dL — ABNORMAL HIGH (ref <1.0)

## 2021-04-12 MED ORDER — PREDNISONE 5 MG PO TABS
7.5000 mg | ORAL_TABLET | Freq: Every day | ORAL | Status: DC
  Administered 2021-04-13 – 2021-04-15 (×3): 7.5 mg via ORAL
  Filled 2021-04-12 (×3): qty 2

## 2021-04-12 NOTE — Progress Notes (Signed)
NIF -40, VC 1.0L. Good patient effort.

## 2021-04-12 NOTE — Progress Notes (Signed)
PROGRESS NOTE        PATIENT DETAILS Name: Elijah Jarchow Sr. Age: 87 y.o. Sex: male Date of Birth: [birth year] Admit Date: 04/04/2021 Admitting Physician Lequita Halt, MD [id number], Devonne Doughty, DO  Brief Narrative: Patient is a 87 y.o. 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  See below for further details.  Subjective: Much better this morning-lying comfortably in bed-spouse at bedside.  On room air at rest.  Acknowledges improvement in breathing and coughing.  Objective: Vitals: Blood pressure (!) 117/54, pulse 76, temperature (!) 97.5 F (36.4 C), temperature source Oral, resp. rate 17, SpO2 92 %.   Exam: Gen Exam:Alert awake-not in any distress HEENT:atraumatic, normocephalic Chest: Clear to auscultation-no rales today. CVS:S1S2 regular Abdomen:soft non tender, non distended Extremities:no edema Neurology: Non focal Skin: no rash   Pertinent Labs/Radiology: Recent Labs  Lab 04/10/21 0158 04/11/21 0135  WBC 7.0  --   HGB 12.7*  --   PLT 215  --   NA 134* 135  K 3.9 3.7  CREATININE 1.19 1.14     Assessment/Plan: Acute hypoxic respiratory failure likely due to COVID-19 pneumonitis: Patient is immunocompromised-but suspicion is that this is COVID-19 pneumonitis given the fact that patient has not made any COVID-19 antibodies in spite of being vaccinated x4.  Empirically treated with Remdesivir/broad-spectrum IV antibiotics and escalating doses of oral prednisone.  Has  improved-on room air this morning-suspect may require O2 with ambulation.  She is given CRP is downtrending-hypoxia is improving-should be okay to go back to his usual dosing of prednisone starting tomorrow.   Discussed briefly with ID MD on 1/4-Dr. Lucianne Lei Dam-recommends that patient use antiviral agents like Paxlovid if he has COVID 19 infection in the future, I have asked the spouse to talk to his primary care practitioner regarding Evushield use for preexposure prophylaxis    Near syncope: Per prior notes-Loop recorder interrogated earlier-showed A. fib with PVCs.  No pauses/bradycardia/tachycardia episodes were evident.  History of AV block: Loop recorder in place-continue telemetry monitoring.  Avoid rate limiting agents.  Myasthenia gravis: Not in flare-continue Mestinon-prednisone (dose doubled due to acute illness/COVID-19 pneumonitis).  Since COVID-19 pneumonitis/hypoxia have overall improved-we will titrate prednisone back to his home dosing.  A few days earlier-patient's spouse was concerned that steroid could aggravate his weakness (although weakness is already started prior to steroid dosage being escalated)  Dementia with delirium: Awake/alert today-but will likely continue to have delirium as long as he is hospitalized and outside of his family surroundings.  Continue Namenda and Aricept.  Debility/deconditioning: Due to COVID-19 infection/PNA/acute illness-initially family refused SNF-but now they seem to be well.  Social worker following.  BMI: Estimated body mass index is 28.98 kg/m as calculated from the following:   Height as of 03/22/21: 5\' 9"  (1.753 m).   Weight as of 03/22/21: 89 kg.    Procedures: None Consults: Cardiology. DVT Prophylaxis: Eliquis Code Status:Full code  Family Communication: Spouse at bedside  Time spent: 50 minutes-Greater than 50% of this time was spent in counseling, explanation of  diagnosis, planning of further management, and coordination of  care.   Disposition Plan: Status is: Inpatient  Remains inpatient appropriate because: Hypoxia due to ongoing COVID-19 pneumonitis.  Still on IV Remdesivir-SNF eventually when closer to discharge.  Will need 21 days of isolation given immunocompromise status.   Diet: Diet Order             Diet regular Room service appropriate? Yes; Fluid consistency: Thin  Diet effective now                     Antimicrobial agents: Anti-infectives (From admission, onward)    Start     Dose/Rate Route Frequency Ordered Stop   04/09/21 1400  cefTRIAXone (ROCEPHIN) 2 g in sodium chloride 0.9 % 100 mL IVPB        2 g 200 mL/hr over 30 Minutes Intravenous Every 24 hours 04/08/21 1435 04/09/21 1323   04/08/21 1000  remdesivir 100 mg in sodium chloride 0.9 % 100 mL IVPB       See Hyperspace for full Linked Orders Report.   100 mg 200 mL/hr over 30 Minutes Intravenous Daily 04/07/21 1026 04/11/21 0926   04/07/21 1200  remdesivir 200 mg in sodium chloride 0.9% 250 mL IVPB       See Hyperspace for full Linked Orders Report.   200 mg 580 mL/hr over 30 Minutes Intravenous Once 04/07/21 1026 04/07/21 1538   04/05/21 1315  doxycycline (VIBRA-TABS) tablet 100 mg  Status:  Discontinued        100 mg Oral Every 12 hours 04/05/21 1311 04/10/21 1211   04/05/21 1245  cefTRIAXone (ROCEPHIN) 2 g in sodium chloride 0.9 % 100 mL IVPB        2 g 200 mL/hr over 30 Minutes Intravenous Every 24 hours 04/05/21 1242 04/09/21 1242   04/05/21 1245  azithromycin (ZITHROMAX) tablet 500 mg  Status:  Discontinued        500 mg Oral Daily 04/05/21 1242 04/05/21 1311        MEDICATIONS: Scheduled Meds:  apixaban  5 mg Oral BID   donepezil  10 mg Oral QHS   feeding supplement  237 mL Oral BID BM   guaiFENesin  600 mg Oral BID   latanoprost  1 drop Both Eyes QHS   memantine  20 mg Oral QHS   pantoprazole  40 mg Oral Daily   predniSONE  10 mg Oral Q breakfast   pyridostigmine  60 mg Oral BID   Continuous  Infusions:   PRN Meds:.acetaminophen, albuterol, benzonatate, chlorpheniramine-HYDROcodone, ipratropium-albuterol, senna-docusate   I have personally reviewed following labs and imaging studies  LABORATORY DATA: CBC: Recent Labs  Lab 04/06/21 0222 04/07/21 0303 04/08/21 0122 04/09/21 0132 04/10/21 0158  WBC 11.6* 12.6* 11.1* 8.6 7.0  HGB 14.4 13.9 13.1 12.8* 12.7*  HCT 43.8 41.4 37.3* 38.3* 36.5*  MCV 96.1 93.9 92.6 93.4 91.9  PLT 219 208 197 202 215     Basic Metabolic Panel: Recent Labs  Lab 04/07/21 0303 04/08/21 0122 04/09/21 0132 04/10/21 0158 04/11/21 0135  NA 136 133* 133* 134* 135  K 4.2 4.0 4.2 3.9 3.7  CL 106 104 105 108 108  CO2 21* 20* 15* 19* 19*  GLUCOSE 101* 115* 98 94 152*  BUN 22 26* 37* 32* 30*  CREATININE 1.38* 1.16 1.35* 1.19 1.14  CALCIUM 8.1* 7.9* 8.1* 8.3* 8.2*     GFR: CrCl cannot be calculated (Unknown ideal weight.).  Liver Function Tests: No results  for input(s): AST, ALT, ALKPHOS, BILITOT, PROT, ALBUMIN in the last 168 hours.  No results for input(s): LIPASE, AMYLASE in the last 168 hours. No results for input(s): AMMONIA in the last 168 hours.  Coagulation Profile: No results for input(s): INR, PROTIME in the last 168 hours.  Cardiac Enzymes: No results for input(s): CKTOTAL, CKMB, CKMBINDEX, TROPONINI in the last 168 hours.  BNP (last 3 results) No results for input(s): PROBNP in the last 8760 hours.  Lipid Profile: No results for input(s): CHOL, HDL, LDLCALC, TRIG, CHOLHDL, LDLDIRECT in the last 72 hours.  Thyroid Function Tests: No results for input(s): TSH, T4TOTAL, FREET4, T3FREE, THYROIDAB in the last 72 hours.  Anemia Panel: No results for input(s): VITAMINB12, FOLATE, FERRITIN, TIBC, IRON, RETICCTPCT in the last 72 hours.  Urine analysis:    Component Value Date/Time   COLORURINE AMBER (A) 03/22/2021 1954   APPEARANCEUR HAZY (A) 03/22/2021 1954   APPEARANCEUR Clear 01/07/2014 1435   LABSPEC 1.024  03/22/2021 1954   LABSPEC 1.020 01/07/2014 1435   PHURINE 5.0 03/22/2021 1954   GLUCOSEU NEGATIVE 03/22/2021 1954   GLUCOSEU Negative 01/07/2014 1435   HGBUR LARGE (A) 03/22/2021 1954   BILIRUBINUR NEGATIVE 03/22/2021 1954   BILIRUBINUR Negative 01/07/2014 1435   KETONESUR 20 (A) 03/22/2021 1954   PROTEINUR 100 (A) 03/22/2021 1954   NITRITE NEGATIVE 03/22/2021 1954   LEUKOCYTESUR NEGATIVE 03/22/2021 1954   LEUKOCYTESUR Negative 01/07/2014 1435    Sepsis Labs: Lactic Acid, Venous    Component Value Date/Time   LATICACIDVEN 1.5 03/22/2021 2243    MICROBIOLOGY: Recent Results (from the past 240 hour(s))  Resp Panel by RT-PCR (Flu A&B, Covid) Nasopharyngeal Swab     Status: Abnormal   Collection Time: 04/04/21 11:54 PM   Specimen: Nasopharyngeal Swab; Nasopharyngeal(NP) swabs in vial transport medium  Result Value Ref Range Status   SARS Coronavirus 2 by RT PCR POSITIVE (A) NEGATIVE Final    Comment: (NOTE) SARS-CoV-2 target nucleic acids are DETECTED.  The SARS-CoV-2 RNA is generally detectable in upper respiratory specimens during the acute phase of infection. Positive results are indicative of the presence of the identified virus, but do not rule out bacterial infection or co-infection with other pathogens not detected by the test. Clinical correlation with patient history and other diagnostic information is necessary to determine patient infection status. The expected result is Negative.  Fact Sheet for Patients: EntrepreneurPulse.com.au  Fact Sheet for Healthcare Providers: IncredibleEmployment.be  This test is not yet approved or cleared by the [place] FDA and  has been authorized for detection and/or diagnosis of SARS-CoV-2 by FDA under an Emergency Use Authorization (EUA).  This EUA will remain in effect (meaning this test can be used) for the duration of  the COVID-19 declaration under Section 564(b)(1) of the A ct,  21 U.S.C. section 360bbb-3(b)(1), unless the authorization is terminated or revoked sooner.     Influenza A by PCR NEGATIVE NEGATIVE Final   Influenza B by PCR NEGATIVE NEGATIVE Final    Comment: (NOTE) The Xpert Xpress SARS-CoV-2/FLU/RSV plus assay is intended as an aid in the diagnosis of influenza from Nasopharyngeal swab specimens and should not be used as a sole basis for treatment. Nasal washings and aspirates are unacceptable for Xpert Xpress SARS-CoV-2/FLU/RSV testing.  Fact Sheet for Patients: EntrepreneurPulse.com.au  Fact Sheet for Healthcare Providers: IncredibleEmployment.be  This test is not yet approved or cleared by the [place] FDA and has been authorized for detection and/or diagnosis of SARS-CoV-2 by FDA under an  Emergency Use Authorization (EUA). This EUA will remain in effect (meaning this test can be used) for the duration of the COVID-19 declaration under Section 564(b)(1) of the Act, 21 U.S.C. section 360bbb-3(b)(1), unless the authorization is terminated or revoked.  Performed at [facility], [place] [address], [place], [place] [address]   Culture, blood (routine x 2)     Status: None   Collection Time: 04/06/21  2:22 AM   Specimen: BLOOD LEFT ARM  Result Value Ref Range Status   Specimen Description BLOOD LEFT ARM  Final   Special Requests   Final    BOTTLES DRAWN AEROBIC AND ANAEROBIC Blood Culture adequate volume   Culture   Final    NO GROWTH 5 DAYS Performed at [facility], [address] [place], [place] [address]    Report Status 04/11/2021 FINAL  Final    RADIOLOGY STUDIES/RESULTS: No results found.   LOS: 7 days   Oren Binet, MD  [facility]    To contact the attending provider between 7A-7P or the covering provider during after hours 7P-7A, please log into the web site [email or web address] and access using universal [place] password for that web site. If you do not  have the password, please call the hospital operator.  04/12/2021, 10:57 AM

## 2021-04-12 NOTE — Progress Notes (Signed)
NIF: -40 VC: 1.5L  Good pt effort

## 2021-04-13 NOTE — Progress Notes (Signed)
SATURATION QUALIFICATIONS: (This note is used to comply with regulatory documentation for home oxygen)  Patient Saturations on Room Air at Rest = 93%  Patient Saturations on Room Air while Ambulating = 92%  Patient ambulated 40 ft with walker and standby assistance.

## 2021-04-13 NOTE — Progress Notes (Signed)
NiF: 32 VC: 1.74 Good pt effort and no distress noted.

## 2021-04-13 NOTE — Progress Notes (Signed)
PROGRESS NOTE        PATIENT DETAILS Name: Elijah Pasquariello Sr. Age: 87 y.o. Sex: male Date of Birth: 01/21/1935 Admit Date: 04/04/2021 Admitting Physician Lequita Halt, MD [id number], Devonne Doughty, DO  Brief Narrative: Patient is a 87 y.o. 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  See below for further details.  Subjective: No major issues-lying comfortably in bed.  On room air this morning.  Not keen on going to SNF-but wife concerned that he is just too weak to go home.  Objective: Vitals: Blood pressure 114/72, pulse 71, temperature (!) 97.3 F (36.3 C), temperature source Oral, resp. rate 19, SpO2 94 %.   Exam: Gen Exam:Alert awake-not in any distress HEENT:atraumatic, normocephalic Chest: B/L clear to auscultation anteriorly CVS:S1S2 regular Abdomen:soft non tender, non distended Extremities:no edema Neurology: Non focal Skin: no rash   Pertinent Labs/Radiology: Recent Labs  Lab 04/10/21 0158 04/11/21 0135  WBC 7.0  --   HGB 12.7*  --   PLT 215  --   NA 134* 135  K 3.9 3.7  CREATININE 1.19 1.14     Assessment/Plan: Acute hypoxic respiratory failure likely due to COVID-19 pneumonitis: Patient is immunocompromised-but suspicion is that this is COVID-19 pneumonitis given the fact that patient has not made any COVID-19 antibodies in spite of being vaccinated x4.  Empirically treated with Remdesivir/broad-spectrum IV antibiotics and escalating doses of oral prednisone.   Has improved with these measures-and now is on room air.  He is back on his usual prednisone regiment of 7.5 mg daily.     Discussed briefly with ID MD on 1/4-Dr. Lucianne Lei Dam-recommends that patient use antiviral agents like Paxlovid if he has COVID 19 infection in the future, I have asked the spouse to talk to his primary care practitioner regarding Evushield use for preexposure prophylaxis    Near syncope: Per prior notes-Loop recorder interrogated earlier-showed A. fib with PVCs.  No pauses/bradycardia/tachycardia episodes were evident.  History of AV block: Loop recorder in place-continue telemetry monitoring.  Avoid rate limiting agents.  Myasthenia gravis: Not in flare-continue Mestinon-prednisone (dose doubled due to acute illness/COVID-19 pneumonitis).  Since COVID-19 pneumonitis/hypoxia have overall improved-we will titrate prednisone back to his home dosing.  A few days earlier-patient's spouse was concerned that steroid could aggravate his weakness (although weakness is already started prior to steroid dosage being escalated)  Dementia with delirium: Awake/alert today-but will likely continue to have delirium as long as he is hospitalized and outside of his family surroundings.  Continue Namenda and Aricept.  Debility/deconditioning: Due to COVID-19 infection/PNA/acute illness-initially family refused SNF-but now they seem to be well.  Social worker following.  BMI: Estimated body mass index is 28.98 kg/m as calculated from the following:   Height as of 03/22/21: 5\' 9"  (1.753 m).   Weight as of 03/22/21: 89 kg.    Procedures: None Consults: Cardiology. DVT Prophylaxis: Eliquis Code Status:Full code  Family Communication: Spouse at bedside  Time spent: 25 minutes-Greater than 50% of this time was spent in counseling, explanation of diagnosis,  planning of further management, and coordination of care.   Disposition Plan: Status is: Inpatient  Remains inpatient appropriate  because: Hypoxia due to ongoing COVID-19 pneumonitis.  Still on IV Remdesivir-SNF eventually when closer to discharge.  Will need 21 days of isolation given immunocompromise status.   Diet: Diet Order             Diet regular Room service appropriate? Yes; Fluid consistency: Thin  Diet effective now                     Antimicrobial agents: Anti-infectives (From admission, onward)    Start     Dose/Rate Route Frequency Ordered Stop   04/09/21 1400  cefTRIAXone (ROCEPHIN) 2 g in sodium chloride 0.9 % 100 mL IVPB        2 g 200 mL/hr over 30 Minutes Intravenous Every 24 hours 04/08/21 1435 04/09/21 1323   04/08/21 1000  remdesivir 100 mg in sodium chloride 0.9 % 100 mL IVPB       See Hyperspace for full Linked Orders Report.   100 mg 200 mL/hr over 30 Minutes Intravenous Daily 04/07/21 1026 04/12/21 0930   04/07/21 1200  remdesivir 200 mg in sodium chloride 0.9% 250 mL IVPB       See Hyperspace for full Linked Orders Report.   200 mg 580 mL/hr over 30 Minutes Intravenous Once 04/07/21 1026 04/07/21 1538   04/05/21 1315  doxycycline (VIBRA-TABS) tablet 100 mg  Status:  Discontinued        100 mg Oral Every 12 hours 04/05/21 1311 04/10/21 1211   04/05/21 1245  cefTRIAXone (ROCEPHIN) 2 g in sodium chloride 0.9 % 100 mL IVPB        2 g 200 mL/hr over 30 Minutes Intravenous Every 24 hours 04/05/21 1242 04/09/21 1242   04/05/21 1245  azithromycin (ZITHROMAX) tablet 500 mg  Status:  Discontinued        500 mg Oral Daily 04/05/21 1242 04/05/21 1311        MEDICATIONS: Scheduled Meds:  apixaban  5 mg Oral BID   donepezil  10 mg Oral QHS   feeding supplement  237 mL Oral BID BM   guaiFENesin  600 mg Oral BID   latanoprost  1 drop Both Eyes QHS   memantine  20 mg Oral QHS   pantoprazole  40 mg Oral Daily   predniSONE  7.5 mg Oral Q breakfast   pyridostigmine  60 mg Oral BID   Continuous Infusions:   PRN Meds:.acetaminophen, albuterol, benzonatate,  chlorpheniramine-HYDROcodone, ipratropium-albuterol, senna-docusate   I have personally reviewed following labs and imaging studies  LABORATORY DATA: CBC: Recent Labs  Lab 04/07/21 0303 04/08/21 0122 04/09/21 0132 04/10/21 0158  WBC 12.6* 11.1* 8.6 7.0  HGB 13.9 13.1 12.8* 12.7*  HCT 41.4 37.3* 38.3* 36.5*  MCV 93.9 92.6 93.4 91.9  PLT 208 197 202 215     Basic Metabolic Panel: Recent Labs  Lab 04/07/21 0303 04/08/21 0122 04/09/21 0132 04/10/21 0158 04/11/21 0135  NA 136 133* 133* 134* 135  K 4.2 4.0 4.2 3.9 3.7  CL 106 104 105 108 108  CO2 21* 20* 15* 19* 19*  GLUCOSE 101* 115* 98 94 152*  BUN 22 26* 37* 32* 30*  CREATININE 1.38* 1.16 1.35* 1.19 1.14  CALCIUM 8.1* 7.9* 8.1* 8.3* 8.2*     GFR: CrCl cannot be calculated (Unknown ideal weight.).  Liver Function Tests: No results for input(s): AST, ALT, ALKPHOS, BILITOT, PROT, ALBUMIN  in the last 168 hours.  No results for input(s): LIPASE, AMYLASE in the last 168 hours. No results for input(s): AMMONIA in the last 168 hours.  Coagulation Profile: No results for input(s): INR, PROTIME in the last 168 hours.  Cardiac Enzymes: No results for input(s): CKTOTAL, CKMB, CKMBINDEX, TROPONINI in the last 168 hours.  BNP (last 3 results) No results for input(s): PROBNP in the last 8760 hours.  Lipid Profile: No results for input(s): CHOL, HDL, LDLCALC, TRIG, CHOLHDL, LDLDIRECT in the last 72 hours.  Thyroid Function Tests: No results for input(s): TSH, T4TOTAL, FREET4, T3FREE, THYROIDAB in the last 72 hours.  Anemia Panel: No results for input(s): VITAMINB12, FOLATE, FERRITIN, TIBC, IRON, RETICCTPCT in the last 72 hours.  Urine analysis:    Component Value Date/Time   COLORURINE AMBER (A) 03/22/2021 1954   APPEARANCEUR HAZY (A) 03/22/2021 1954   APPEARANCEUR Clear 01/07/2014 1435   LABSPEC 1.024 03/22/2021 1954   LABSPEC 1.020 01/07/2014 1435   PHURINE 5.0 03/22/2021 1954   GLUCOSEU NEGATIVE 03/22/2021  1954   GLUCOSEU Negative 01/07/2014 1435   HGBUR LARGE (A) 03/22/2021 1954   BILIRUBINUR NEGATIVE 03/22/2021 1954   BILIRUBINUR Negative 01/07/2014 1435   KETONESUR 20 (A) 03/22/2021 1954   PROTEINUR 100 (A) 03/22/2021 1954   NITRITE NEGATIVE 03/22/2021 1954   LEUKOCYTESUR NEGATIVE 03/22/2021 1954   LEUKOCYTESUR Negative 01/07/2014 1435    Sepsis Labs: Lactic Acid, Venous    Component Value Date/Time   LATICACIDVEN 1.5 03/22/2021 2243    MICROBIOLOGY: Recent Results (from the past 240 hour(s))  Resp Panel by RT-PCR (Flu A&B, Covid) Nasopharyngeal Swab     Status: Abnormal   Collection Time: 04/04/21 11:54 PM   Specimen: Nasopharyngeal Swab; Nasopharyngeal(NP) swabs in vial transport medium  Result Value Ref Range Status   SARS Coronavirus 2 by RT PCR POSITIVE (A) NEGATIVE Final    Comment: (NOTE) SARS-CoV-2 target nucleic acids are DETECTED.  The SARS-CoV-2 RNA is generally detectable in upper respiratory specimens during the acute phase of infection. Positive results are indicative of the presence of the identified virus, but do not rule out bacterial infection or co-infection with other pathogens not detected by the test. Clinical correlation with patient history and other diagnostic information is necessary to determine patient infection status. The expected result is Negative.  Fact Sheet for Patients: EntrepreneurPulse.com.au  Fact Sheet for Healthcare Providers: IncredibleEmployment.be  This test is not yet approved or cleared by the [place] FDA and  has been authorized for detection and/or diagnosis of SARS-CoV-2 by FDA under an Emergency Use Authorization (EUA).  This EUA will remain in effect (meaning this test can be used) for the duration of  the COVID-19 declaration under Section 564(b)(1) of the A ct, 21 U.S.C. section 360bbb-3(b)(1), unless the authorization is terminated or revoked sooner.     Influenza A by  PCR NEGATIVE NEGATIVE Final   Influenza B by PCR NEGATIVE NEGATIVE Final    Comment: (NOTE) The Xpert Xpress SARS-CoV-2/FLU/RSV plus assay is intended as an aid in the diagnosis of influenza from Nasopharyngeal swab specimens and should not be used as a sole basis for treatment. Nasal washings and aspirates are unacceptable for Xpert Xpress SARS-CoV-2/FLU/RSV testing.  Fact Sheet for Patients: EntrepreneurPulse.com.au  Fact Sheet for Healthcare Providers: IncredibleEmployment.be  This test is not yet approved or cleared by the [place] FDA and has been authorized for detection and/or diagnosis of SARS-CoV-2 by FDA under an Emergency Use Authorization (EUA). This EUA will remain  in effect (meaning this test can be used) for the duration of the COVID-19 declaration under Section 564(b)(1) of the Act, 21 U.S.C. section 360bbb-3(b)(1), unless the authorization is terminated or revoked.  Performed at [facility], [place] [address], [place], [place] [address]   Culture, blood (routine x 2)     Status: None   Collection Time: 04/06/21  2:22 AM   Specimen: BLOOD LEFT ARM  Result Value Ref Range Status   Specimen Description BLOOD LEFT ARM  Final   Special Requests   Final    BOTTLES DRAWN AEROBIC AND ANAEROBIC Blood Culture adequate volume   Culture   Final    NO GROWTH 5 DAYS Performed at [facility], [address], [place], [place] [address]    Report Status 04/11/2021 FINAL  Final    RADIOLOGY STUDIES/RESULTS: No results found.   LOS: 8 days   Oren Binet, MD  [facility]    To contact the attending provider between 7A-7P or the covering provider during after hours 7P-7A, please log into the web site [email or web address] and access using universal [place] password for that web site. If you do not have the password, please call the hospital operator.  04/13/2021, 1:23 PM

## 2021-04-14 NOTE — Progress Notes (Signed)
NIF- 26 VC 1.8 L  Patient performed with good effort. RT will continue to monitor.

## 2021-04-14 NOTE — Progress Notes (Signed)
NIF-30 VC-1.2L  With good effort. No complications. RT will continue to monitor.

## 2021-04-14 NOTE — Plan of Care (Signed)
°  Problem: Activity: Goal: Ability to tolerate increased activity will improve Outcome: Progressing   Problem: Clinical Measurements: Goal: Ability to maintain a body temperature in the normal range will improve Outcome: Progressing   Problem: Respiratory: Goal: Ability to maintain adequate ventilation will improve Outcome: Progressing Goal: Ability to maintain a clear airway will improve Outcome: Progressing   Problem: Education: Goal: Knowledge of risk factors and measures for prevention of condition will improve Outcome: Progressing   Problem: Coping: Goal: Psychosocial and spiritual needs will be supported Outcome: Progressing   Problem: Respiratory: Goal: Will maintain a patent airway Outcome: Progressing Goal: Complications related to the disease process, condition or treatment will be avoided or minimized Outcome: Progressing

## 2021-04-14 NOTE — Progress Notes (Addendum)
PROGRESS NOTE        PATIENT DETAILS Name: Elijah Sangalang Sr. Age: 87 y.o. Sex: male Date of Birth: 07/11/1934 Admit Date: 04/04/2021 Admitting Physician Lequita Halt, MD [id number], Devonne Doughty, DO  Brief Narrative: Patient is a 87 y.o. 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  See below for further details.  Subjective: On room air-wants to go home.  No cough or shortness of breath.  Objective: Vitals: Blood pressure 139/62, pulse 71, temperature 98.2 F (36.8 C), temperature source Oral, resp. rate 16, SpO2 92 %.   Exam: Gen Exam:Alert awake-not in any distress HEENT:atraumatic, normocephalic Chest: B/L clear to auscultation anteriorly CVS:S1S2 regular Abdomen:soft non tender, non distended Extremities:no edema Neurology: Non focal Skin: no rash   Pertinent Labs/Radiology: Recent Labs  Lab 04/10/21 0158 04/11/21 0135  WBC 7.0  --   HGB 12.7*  --   PLT 215  --   NA 134* 135  K 3.9 3.7  CREATININE 1.19 1.14     Assessment/Plan: Acute hypoxic respiratory failure likely due to COVID-19 pneumonitis: Patient is immunocompromised-but suspicion is that this is COVID-19 pneumonitis given the fact that patient has not made any COVID-19 antibodies in spite of being vaccinated x4.  Significant improvement-now on room air-treated empirically with broad-spectrum IV antibiotics/Remdesivir and escalating doses of steroids.  He has completed antimicrobial therapy-and is back on his  usual dosing of oral steroids.   Discussed briefly with ID MD on 1/4-Dr. Lucianne Lei Dam-recommends that patient use antiviral agents like Paxlovid if he has COVID 19 infection in the future, I have asked the spouse to talk to his primary care practitioner regarding Evushield use for preexposure prophylaxis    Near syncope: Per prior notes-Loop recorder interrogated earlier-showed A. fib with PVCs.  No pauses/bradycardia/tachycardia episodes were evident.  History of AV block: Loop recorder in place-continue telemetry monitoring.  Avoid rate limiting agents.  Myasthenia gravis: Not in flare-continue Mestinon-prednisone (dose doubled due to acute illness/COVID-19 pneumonitis).  Since COVID-19 pneumonitis/hypoxia have overall improved-he has now been titrated back to his usual dosing of prednisone.     A few days earlier-patient's spouse was concerned that steroid could aggravate his weakness (although weakness is already started prior to steroid dosage being escalated)  Dementia with delirium: Awake/alert today-but will likely continue to have delirium as long as he is hospitalized and outside of his family surroundings.  Continue Namenda and Aricept.  Debility/deconditioning: Due to COVID-19 infection/PNA/acute illness-deconditioning has improved-patient is not keen on going to SNF-he will go home with home health services.  Family will take patient home tomorrow-they need 1 extra day to make necessary arrangements at home.  BMI: Estimated body mass index is 28.98 kg/m as calculated from the following:   Height as of 03/22/21: 5\' 9"  (1.753 m).   Weight as of 03/22/21: 89 kg.    Procedures: None Consults: Cardiology. DVT Prophylaxis: Eliquis Code Status:Full code  Family Communication: Spouse/daughter at bedside  Time spent: 25 minutes-Greater than 50% of this time was spent in counseling, explanation of diagnosis,  planning of further management, and coordination of care.   Disposition  Plan: Status is: Inpatient  Remains inpatient appropriate because: Likely home on 1/10 with home health services.  See above.   Diet: Diet Order             Diet regular Room service appropriate? Yes; Fluid consistency: Thin  Diet effective now                     Antimicrobial agents: Anti-infectives (From admission, onward)    Start     Dose/Rate Route Frequency Ordered Stop   04/09/21 1400  cefTRIAXone (ROCEPHIN) 2 g in sodium chloride 0.9 % 100 mL IVPB        2 g 200 mL/hr over 30 Minutes Intravenous Every 24 hours 04/08/21 1435 04/09/21 1323   04/08/21 1000  remdesivir 100 mg in sodium chloride 0.9 % 100 mL IVPB       See Hyperspace for full Linked Orders Report.   100 mg 200 mL/hr over 30 Minutes Intravenous Daily 04/07/21 1026 04/12/21 0930   04/07/21 1200  remdesivir 200 mg in sodium chloride 0.9% 250 mL IVPB       See Hyperspace for full Linked Orders Report.   200 mg 580 mL/hr over 30 Minutes Intravenous Once 04/07/21 1026 04/07/21 1538   04/05/21 1315  doxycycline (VIBRA-TABS) tablet 100 mg  Status:  Discontinued        100 mg Oral Every 12 hours 04/05/21 1311 04/10/21 1211   04/05/21 1245  cefTRIAXone (ROCEPHIN) 2 g in sodium chloride 0.9 % 100 mL IVPB        2 g 200 mL/hr over 30 Minutes Intravenous Every 24 hours 04/05/21 1242 04/09/21 1242   04/05/21 1245  azithromycin (ZITHROMAX) tablet 500 mg  Status:  Discontinued        500 mg Oral Daily 04/05/21 1242 04/05/21 1311        MEDICATIONS: Scheduled Meds:  apixaban  5 mg Oral BID   donepezil  10 mg Oral QHS   feeding supplement  237 mL Oral BID BM   guaiFENesin  600 mg Oral BID   latanoprost  1 drop Both Eyes QHS   memantine  20 mg Oral QHS   pantoprazole  40 mg Oral Daily   predniSONE  7.5 mg Oral Q breakfast   pyridostigmine  60 mg Oral BID   Continuous Infusions:   PRN Meds:.acetaminophen, albuterol, benzonatate, chlorpheniramine-HYDROcodone, ipratropium-albuterol, senna-docusate   I  have personally reviewed following labs and imaging studies  LABORATORY DATA: CBC: Recent Labs  Lab 04/08/21 0122 04/09/21 0132 04/10/21 0158  WBC 11.1* 8.6 7.0  HGB 13.1 12.8* 12.7*  HCT 37.3* 38.3* 36.5*  MCV 92.6 93.4 91.9  PLT 197 202 215     Basic Metabolic Panel: Recent Labs  Lab 04/08/21 0122 04/09/21 0132 04/10/21 0158 04/11/21 0135  NA 133* 133* 134* 135  K 4.0 4.2 3.9 3.7  CL 104 105 108 108  CO2 20* 15* 19* 19*  GLUCOSE 115* 98 94 152*  BUN 26* 37* 32* 30*  CREATININE 1.16 1.35* 1.19 1.14  CALCIUM 7.9* 8.1* 8.3* 8.2*     GFR: CrCl cannot be calculated (Unknown ideal weight.).  Liver Function Tests: No results for input(s): AST, ALT, ALKPHOS, BILITOT, PROT, ALBUMIN in the last 168 hours.  No results for input(s): LIPASE, AMYLASE in the last 168 hours. No results for input(s): AMMONIA in the last 168 hours.  Coagulation Profile: No results  for input(s): INR, PROTIME in the last 168 hours.  Cardiac Enzymes: No results for input(s): CKTOTAL, CKMB, CKMBINDEX, TROPONINI in the last 168 hours.  BNP (last 3 results) No results for input(s): PROBNP in the last 8760 hours.  Lipid Profile: No results for input(s): CHOL, HDL, LDLCALC, TRIG, CHOLHDL, LDLDIRECT in the last 72 hours.  Thyroid Function Tests: No results for input(s): TSH, T4TOTAL, FREET4, T3FREE, THYROIDAB in the last 72 hours.  Anemia Panel: No results for input(s): VITAMINB12, FOLATE, FERRITIN, TIBC, IRON, RETICCTPCT in the last 72 hours.  Urine analysis:    Component Value Date/Time   COLORURINE AMBER (A) 03/22/2021 1954   APPEARANCEUR HAZY (A) 03/22/2021 1954   APPEARANCEUR Clear 01/07/2014 1435   LABSPEC 1.024 03/22/2021 1954   LABSPEC 1.020 01/07/2014 1435   PHURINE 5.0 03/22/2021 1954   GLUCOSEU NEGATIVE 03/22/2021 1954   GLUCOSEU Negative 01/07/2014 1435   HGBUR LARGE (A) 03/22/2021 1954   BILIRUBINUR NEGATIVE 03/22/2021 1954   BILIRUBINUR Negative 01/07/2014 1435    KETONESUR 20 (A) 03/22/2021 1954   PROTEINUR 100 (A) 03/22/2021 1954   NITRITE NEGATIVE 03/22/2021 1954   LEUKOCYTESUR NEGATIVE 03/22/2021 1954   LEUKOCYTESUR Negative 01/07/2014 1435    Sepsis Labs: Lactic Acid, Venous    Component Value Date/Time   LATICACIDVEN 1.5 03/22/2021 2243    MICROBIOLOGY: Recent Results (from the past 240 hour(s))  Resp Panel by RT-PCR (Flu A&B, Covid) Nasopharyngeal Swab     Status: Abnormal   Collection Time: 04/04/21 11:54 PM   Specimen: Nasopharyngeal Swab; Nasopharyngeal(NP) swabs in vial transport medium  Result Value Ref Range Status   SARS Coronavirus 2 by RT PCR POSITIVE (A) NEGATIVE Final    Comment: (NOTE) SARS-CoV-2 target nucleic acids are DETECTED.  The SARS-CoV-2 RNA is generally detectable in upper respiratory specimens during the acute phase of infection. Positive results are indicative of the presence of the identified virus, but do not rule out bacterial infection or co-infection with other pathogens not detected by the test. Clinical correlation with patient history and other diagnostic information is necessary to determine patient infection status. The expected result is Negative.  Fact Sheet for Patients: EntrepreneurPulse.com.au  Fact Sheet for Healthcare Providers: IncredibleEmployment.be  This test is not yet approved or cleared by the [place] FDA and  has been authorized for detection and/or diagnosis of SARS-CoV-2 by FDA under an Emergency Use Authorization (EUA).  This EUA will remain in effect (meaning this test can be used) for the duration of  the COVID-19 declaration under Section 564(b)(1) of the A ct, 21 U.S.C. section 360bbb-3(b)(1), unless the authorization is terminated or revoked sooner.     Influenza A by PCR NEGATIVE NEGATIVE Final   Influenza B by PCR NEGATIVE NEGATIVE Final    Comment: (NOTE) The Xpert Xpress SARS-CoV-2/FLU/RSV plus assay is intended as an  aid in the diagnosis of influenza from Nasopharyngeal swab specimens and should not be used as a sole basis for treatment. Nasal washings and aspirates are unacceptable for Xpert Xpress SARS-CoV-2/FLU/RSV testing.  Fact Sheet for Patients: EntrepreneurPulse.com.au  Fact Sheet for Healthcare Providers: IncredibleEmployment.be  This test is not yet approved or cleared by the [place] FDA and has been authorized for detection and/or diagnosis of SARS-CoV-2 by FDA under an Emergency Use Authorization (EUA). This EUA will remain in effect (meaning this test can be used) for the duration of the COVID-19 declaration under Section 564(b)(1) of the Act, 21 U.S.C. section 360bbb-3(b)(1), unless the authorization is terminated or revoked.  Performed at [facility], [place] [address] [place], [place] [address]   Culture, blood (routine x 2)     Status: None   Collection Time: 04/06/21  2:22 AM   Specimen: BLOOD LEFT ARM  Result Value Ref Range Status   Specimen Description BLOOD LEFT ARM  Final   Special Requests   Final    BOTTLES DRAWN AEROBIC AND ANAEROBIC Blood Culture adequate volume   Culture   Final    NO GROWTH 5 DAYS Performed at [facility], [address], [place], [place] [address]    Report Status 04/11/2021 FINAL  Final    RADIOLOGY STUDIES/RESULTS: No results found.   LOS: 9 days   Oren Binet, MD  [facility]    To contact the attending provider between 7A-7P or the covering provider during after hours 7P-7A, please log into the web site [email or web address] and access using universal [place] password for that web site. If you do not have the password, please call the hospital operator.  04/14/2021, 12:04 PM

## 2021-04-14 NOTE — Progress Notes (Signed)
Physical Therapy Treatment Patient Details Name: Elijah Tadros Sr. MRN: [id number] DOB: 1934/07/04 Today's Date: 04/14/2021   History of Present Illness 87yo male wh presented on 12/30 with persistent cough, fever, and weakness. Of note had recent hospitalization for Covid and DCed home 12/23. Now readmitted with CAP, postviral bacterial pneumonia in [facility]. 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 Able to ambulate 180 ft with RW without dyspnea on room air. Discussed advantages of discharge home (increased activity, better for cognition) and recommendation for HHPT. Wife remains concerned that pt will go home and not move and get worse and need to return to hospital. Patient included in all discussions and states he understands importance of walking/activity when he gets home--however he does have some cognitive impairments at baseline. PT will continue to follow at 3x/week frequency.    Recommendations for follow up therapy are one component of a multi-disciplinary discharge planning process, led by the attending physician.  Recommendations may be updated based on patient status, additional functional criteria and insurance authorization.  Follow Up Recommendations  Home health PT     Assistance Recommended at Discharge Frequent or constant Supervision/Assistance  Patient can return home with the following A little help with walking and/or transfers;A little help with bathing/dressing/bathroom;Assist for transportation   Equipment Recommendations  Rolling walker (2 wheels);BSC/3in1;Wheelchair (measurements PT);Wheelchair cushion (measurements PT)    Recommendations for Other Services       Precautions / Restrictions Precautions Precautions: Fall Precaution Comments: myasthenia gravis     Mobility   Bed Mobility Overal bed mobility: Needs Assistance Bed Mobility: Supine to Sit;Sit to Supine     Supine to sit: Supervision     General bed mobility comments: up with HOB 0 without assist    Transfers Overall transfer level: Needs assistance Equipment used: Rolling walker (2 wheels) Transfers: Sit to/from Stand Sit to Stand: Min guard           General transfer comment: vc for sequencing with hand placment with RW each transfer    Ambulation/Gait Ambulation/Gait assistance: Min guard Gait Distance (Feet): 180 Feet Assistive device: Rolling walker (2 wheels) Gait Pattern/deviations: Step-through pattern Gait velocity: decreased     General Gait Details: mildly unsteady with support of RW lightly needed. Offered to attempt walking without RW and pt refused stating he needed the support right now. Lengthy discussion with pt/family re: pt's need to continue to use RW at home (apparently pt has a history of telling PT he will use it and then does not once home).   Stairs             Wheelchair Mobility    Modified Rankin (Stroke Patients Only)       Balance Overall balance assessment: Needs assistance Sitting-balance support: Feet supported;No upper extremity supported Sitting balance-Leahy Scale: Fair Sitting balance - Comments: sat on EOB   Standing balance support: No upper extremity supported Standing balance-Leahy Scale: Fair                              Cognition Arousal/Alertness: Awake/alert Behavior During Therapy: Flat affect;WFL for tasks assessed/performed Overall Cognitive Status: History of cognitive impairments - at baseline  General Comments: very clear that he does not want to go to SNF        Exercises      General Comments General comments (skin integrity, edema, etc.): Wife and daughter prsent with lengthy discussion re: discharge plan. Patient adamantly wants to go home  and reports he will use RW and walk daily for exercise. Wife reports he will say this but will go home and sit in his recliner until he's so weak he cannot get up and she'll have to bring him back to the hospital. She is concerned about pt being alone when she leaves for her doctor's appointments (2-3 hours) at a time. Suggested she could take pt with her.      Pertinent Vitals/Pain Pain Assessment: No/denies pain    Home Living                          Prior Function            PT Goals (current goals can now be found in the care plan section) Acute Rehab PT Goals Patient Stated Goal: to return home PT Goal Formulation: With patient/family Time For Goal Achievement: 04/20/21 Potential to Achieve Goals: Fair Progress towards PT goals: Progressing toward goals    Frequency    Min 3X/week      PT Plan Current plan remains appropriate    Co-evaluation              AM-PAC PT "6 Clicks" Mobility   Outcome Measure  Help needed turning from your back to your side while in a flat bed without using bedrails?: A Little Help needed moving from lying on your back to sitting on the side of a flat bed without using bedrails?: A Little Help needed moving to and from a bed to a chair (including a wheelchair)?: A Little Help needed standing up from a chair using your arms (e.g., wheelchair or bedside chair)?: A Little Help needed to walk in hospital room?: A Little Help needed climbing 3-5 steps with a railing? : A Little 6 Click Score: 18    End of Session Equipment Utilized During Treatment: Gait belt Activity Tolerance: Patient tolerated treatment well Patient left: with call bell/phone within reach;in chair;with chair alarm set;with family/visitor present Nurse Communication: Mobility status;Other (comment) (discussion with family re: dc plan) PT Visit Diagnosis: Unsteadiness on feet (R26.81);Muscle weakness (generalized) (M62.81);Other abnormalities of gait and  mobility (R26.89)     Time: 7106-2694 PT Time Calculation (min) (ACUTE ONLY): 40 min  Charges:  $Gait Training: 23-37 mins $Self Care/Home Management: [place], PT Acute Rehabilitation Services  Pager [phone] Office [phone]    Rexanne Mano 04/14/2021, 11:49 AM

## 2021-04-14 NOTE — TOC Progression Note (Addendum)
Transition of Care (TOC) - Progression Note    Patient Details  Name: Elijah Kleinsasser Sr. MRN: [id number] Date of Birth: 05-26-1934  [facility]) CM/SW Contact  Cyndi Bender, RN Phone Number: 04/14/2021, 2:37 PM  Clinical Narrative:    Patient was recently discharged and was active with [facility]. Spoke to [place] with [facility] and referral accepted for PT/OT.    [place] to wife, Elijah Jackson, regarding discharge needs. She was driving and told me she would call me back. Awaiting return call.  Patient may need PTAR transportation home.  Expected Discharge Plan: Skilled Nursing Facility Barriers to Discharge: SNF Covid  Expected Discharge Plan and Services Expected Discharge Plan: [place] In-house Referral: Clinical Social Work Discharge Planning Services: CM Consult Post Acute Care Eli[place]: [place] arrangements for the past 2 months: [place]: PT, OT [place] Agency: [place] ([facility]) Date HH Agency Contacted: 04/07/21 Time HH Agency Contacted: 62 Representative spoke with at [place]: [place] ([place]) Interventions    Readmission Risk Interventions No flowsheet data found.

## 2021-04-15 ENCOUNTER — Telehealth: Payer: Self-pay

## 2021-04-15 DIAGNOSIS — I480 Paroxysmal atrial fibrillation: Secondary | ICD-10-CM

## 2021-04-15 NOTE — TOC Transition Note (Signed)
Transition of Care [facility]) - CM/SW Discharge Note   Patient Details  Name: Elijah Jackson. MRN: [id number] Date of Birth: 16-Jul-1934  Transition of Care [facility]) CM/SW Contact:  Carles Collet, RN Phone Number: 04/15/2021, 10:07 AM   Clinical Narrative:    Wife at bedside. She plans on transporting him home. Requested WC be delivered to the home, notified [employer]. Updated [facility]) that patient will DC today.     Final next level of care: [place] Barriers to Discharge: No Barriers Identified   Patient Goals and CMS Choice Patient states their goals for this hospitalization and ongoing recovery are:: return home CMS Medicare.gov Compare Post Acute Care list provided to:: Patient Represenative (must comment) Choice offered to / list presented to : Spouse  Discharge Placement                       Discharge Plan and Services In-house Referral: Clinical Social Work Discharge Planning Services: CM Consult Post Acute Care Choice: [place]          DME Arranged: Youth worker wheelchair with seat cushion DME Agency: [employer] Date DME Agency Contacted: 04/15/21 Time DME Agency Contacted: 1004 Representative spoke with at DME Agency: Freda Munro HH Arranged: OT, PT [place] Agency: [place] ([place]) Date [place]: 04/15/21 Time [place]: 1004 Representative spoke with at [place]: [place] Determinants of Health ([place]) Interventions     Readmission Risk Interventions No flowsheet data found.

## 2021-04-15 NOTE — Care Management (Signed)
°  °  Durable Medical Equipment  (From admission, onward)           Start     Ordered   04/14/21 1212  For home use only DME Bedside commode  Once       Question:  Patient needs a bedside commode to treat with the following condition  Answer:  Physical deconditioning   04/14/21 1211   04/14/21 1212  For home use only DME standard manual wheelchair with seat cushion  Once       Comments: Patient suffers from physical deconditioning which impairs their ability to perform daily activities like bathing, dressing, feeding, grooming, and toileting in the home.  A cane, crutch, or walker will not resolve issue with performing activities of daily living. A wheelchair will allow patient to safely perform daily activities. Patient can safely propel the wheelchair in the home or has a caregiver who can provide assistance. Length of need 6 months . Accessories: elevating leg rests (ELRs), wheel locks, extensions and anti-tippers.   04/14/21 1211   04/14/21 1211  For home use only DME Walker rolling  Once       Question Answer Comment  Walker: With [place]   Patient needs a walker to treat with the following condition Physical deconditioning      04/14/21 1211

## 2021-04-15 NOTE — Discharge Summary (Signed)
PATIENT DETAILS Name: Elijah Hua Sr. Age: 87 y.o. Sex: male Date of Birth: 1934-07-30 MRN: [id number]. Admitting Physician: Lequita Halt, MD [id number], Devonne Doughty, DO  Admit Date: 04/04/2021 Discharge date: 04/15/2021  Recommendations for Outpatient Follow-up:  Follow up with PCP in 1-2 weeks Please obtain CMP/CBC in one week Immunocompromise-no immune response to COVID-19 vaccination-consider outpatient evaluation for Evushield   Admitted From:  Home  Disposition: Home with home health services   Home Health: Yes  Equipment/Devices: None  Discharge Condition: Stable  CODE STATUS: FULL CODE  Diet recommendation:  Diet Order             Diet - low sodium heart healthy           Diet regular Room service appropriate? Yes; Fluid consistency: Thin  Diet effective now                    Brief Summary: Patient is a 87 y.o. 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  See below for further details.  Brief Hospital Course: Acute hypoxic respiratory failure likely due to COVID-19 pneumonitis: Patient is immunocompromised-but suspicion hypoxia was from ongoing COVID-19 pneumonitis because he has not made any antibodies to COVID-19 vaccination x4.  He was empirically treated with Remdesivir, broad-spectrum IV antibiotics, and escalating doses of steroids.  With these measures-he improved-his hypoxia has resolved-he is now on room air.  At 1 point during hospital stay-he was on 6 L of  oxygen.   Discussed briefly with ID MD on 1/4-Dr. Lucianne Lei Dam-recommends that patient use antiviral agents like Paxlovid if he has COVID 19 infection in the future, I have asked the spouse to talk to his primary care practitioner regarding Evushield use for preexposure prophylaxis      Near syncope: Per prior notes-Loop recorder interrogated earlier-showed A. fib with PVCs.  No pauses/bradycardia/tachycardia episodes were evident.   History of AV block: Loop recorder in place-continue telemetry monitoring.  Avoid rate limiting agents.   Myasthenia gravis: Not in flare-continue Mestinon-prednisone (dose doubled due to acute illness/COVID-19 pneumonitis).  Since COVID-19 pneumonitis/hypoxia have overall improved-he has now been titrated back to his usual dosing of prednisone.      A few days earlier-patient's spouse was concerned that steroid could aggravate his weakness (although weakness  already started prior to steroid dosage being escalated)   Dementia with delirium: Awake/alert today-but will likely continue to have delirium as long as he is hospitalized and outside of his family surroundings.  Continue Namenda and Aricept.   Debility/deconditioning: Due to COVID-19 infection/PNA/acute illness-deconditioning has improved-patient is not keen on going to SNF-he will go home with home health services.      BMI: Estimated body mass index is 28.98 kg/m as calculated from the following:   Height as of 03/22/21: 5\' 9"  (1.753 m).   Weight as of 03/22/21: 89 kg.    Procedures None  Discharge Diagnoses:  Principal Problem:   CAP (community acquired pneumonia) Active Problems:   Community acquired pneumonia   Discharge Instructions:  Activity:  As tolerated with Full fall precautions use walker/cane & assistance as needed  Discharge Instructions     Call  MD for:  difficulty breathing, headache or visual disturbances   Complete by: As directed    Diet - low sodium heart healthy    Complete by: As directed    Discharge instructions   Complete by: As directed    Follow with Primary MD  Olin Hauser, DO in 1-2 weeks  Please get a complete blood count and chemistry panel checked by your Primary MD at your next visit, and again as instructed by your Primary MD.  Get Medicines reviewed and adjusted: Please take all your medications with you for your next visit with your Primary MD  Laboratory/radiological data: Please request your Primary MD to go over all hospital tests and procedure/radiological results at the follow up, please ask your Primary MD to get all Hospital records sent to his/her office.  In some cases, they will be blood work, cultures and biopsy results pending at the time of your discharge. Please request that your primary care M.D. follows up on these results.  Also Note the following: If you experience worsening of your admission symptoms, develop shortness of breath, life threatening emergency, suicidal or homicidal thoughts you must seek medical attention immediately by calling 911 or calling your MD immediately  if symptoms less severe.  You must read complete instructions/literature along with all the possible adverse reactions/side effects for all the Medicines you take and that have been prescribed to you. Take any new Medicines after you have completely understood and accpet all the possible adverse reactions/side effects.   Do not drive when taking Pain medications or sleeping medications (Benzodaizepines)  Do not take more than prescribed Pain, Sleep and Anxiety Medications. It is not advisable to combine anxiety,sleep and pain medications without talking with your primary care practitioner  Special Instructions: If you have smoked or chewed Tobacco  in the last 2 yrs please stop smoking, stop any regular Alcohol  and or any Recreational drug use.  Wear Seat belts while driving.  Please note: You were cared for by a hospitalist  during your hospital stay. Once you are discharged, your primary care physician will handle any further medical issues. Please note that NO REFILLS for any discharge medications will be authorized once you are discharged, as it is imperative that you return to your primary care physician (or establish a relationship with a primary care physician if you do not have one) for your post hospital discharge needs so that they can reassess your need for medications and monitor your lab values.   Increase activity slowly   Complete by: As directed       Allergies as of 04/15/2021       Reactions   Pravastatin Other (See Comments)   Simvastatin Other (See Comments)        Medication List     TAKE these medications    acetaminophen 325 MG tablet Commonly known as: TYLENOL Take 1 tablet (325 mg total) by mouth every 6 (six) hours as needed for mild pain (or Fever >/= 101).   apixaban 5 MG Tabs tablet Commonly known as: ELIQUIS Take 1 tablet (5 mg total) by mouth 2 (two) times daily.   donepezil 10 MG tablet Commonly known as: ARICEPT Take 10 mg by mouth at bedtime.   feeding supplement Liqd Take 237 mLs by mouth 2 (two) times daily between meals.   latanoprost 0.005 % ophthalmic solution Commonly known as: XALATAN Place 1 drop into both eyes at bedtime.   memantine 10 MG tablet Commonly known  as: NAMENDA Take 20 mg by mouth at bedtime.   omeprazole 40 MG capsule Commonly known as: PRILOSEC Take 1 capsule (40 mg total) by mouth 2 (two) times daily before a meal.   predniSONE 5 MG tablet Commonly known as: DELTASONE Take 5 mg by mouth daily. (Take with 2mg  tablets to equal 7mg  total)   predniSONE 1 MG tablet Commonly known as: DELTASONE Take 2 mg by mouth daily with breakfast. (Take with 5mg  tablet to equal 7mg  total)   pyridostigmine 60 MG tablet Commonly known as: MESTINON Take 60 mg by mouth 2 (two) times daily.   senna-docusate 8.6-50 MG tablet Commonly known as:  Senokot-S Take 1 tablet by mouth at bedtime as needed for mild constipation.   Vitamins/Minerals Tabs Take 1 tablet by mouth daily.               Durable Medical Equipment  (From admission, onward)           Start     Ordered   04/14/21 1212  For home use only DME Bedside commode  Once       Question:  Patient needs a bedside commode to treat with the following condition  Answer:  Physical deconditioning   04/14/21 1211   04/14/21 1212  For home use only DME standard manual wheelchair with seat cushion  Once       Comments: Patient suffers from physical deconditioning which impairs their ability to perform daily activities like bathing, dressing, feeding, grooming, and toileting in the home.  A cane, crutch, or walker will not resolve issue with performing activities of daily living. A wheelchair will allow patient to safely perform daily activities. Patient can safely propel the wheelchair in the home or has a caregiver who can provide assistance. Length of need 6 months . Accessories: elevating leg rests (ELRs), wheel locks, extensions and anti-tippers.   04/14/21 1211   04/14/21 1211  For home use only DME Walker rolling  Once       Question Answer Comment  Walker: With Universal   Patient needs a walker to treat with the following condition Physical deconditioning      04/14/21 1211            Follow-up [place] Follow up.   Why: the office will call to schedule home health visits Contact information: [place] [address] [phone]                Allergies  Allergen Reactions   Pravastatin Other (See Comments)   Simvastatin Other (See Comments)      Consultations:  cardiology    Other Procedures/Studies: DG Chest 1 View  Result Date: 03/22/2021 CLINICAL DATA:  Weakness. EXAM: CHEST  1 VIEW COMPARISON:  March 31, 2018 FINDINGS: The heart size and mediastinal  contours are within normal limits. Both lungs are clear. The visualized skeletal structures are unremarkable. IMPRESSION: No active disease. Electronically Signed   By: Dorise Bullion III M.D.   On: 03/22/2021 16:46   DG Chest 2 View  Result Date: 03/24/2021 CLINICAL DATA:  Per MD note, COVID 19 virus on 12/17 EXAM: CHEST - 2 VIEW COMPARISON:  03/22/2021 FINDINGS: Prominent interstitial markings in the lung bases as before. Slight increase in interstitial opacities in the right upper lung. No confluent airspace disease. Heart size and mediastinal contours are within normal limits. Subcutaneous monitoring device overlies the left chest. No  effusion.  No pneumothorax. Visualized bones unremarkable. IMPRESSION: Subtle right upper lobe interstitial infiltrate more conspicuous than on prior study. Electronically Signed   By: Lucrezia Europe M.D.   On: 03/24/2021 16:17   CT Head Wo Contrast  Result Date: 04/05/2021 CLINICAL DATA:  Mental status change. EXAM: CT HEAD WITHOUT CONTRAST TECHNIQUE: Contiguous axial images were obtained from the base of the skull through the vertex without intravenous contrast. COMPARISON:  03/26/2021 FINDINGS: Brain: No evidence of acute infarction, hemorrhage, hydrocephalus, extra-axial collection or mass lesion/mass effect. There is marked diffuse low-attenuation within the subcortical and periventricular white matter compatible with chronic microvascular disease. Prominence of the sulci and ventricles compatible with brain atrophy. Vascular: No hyperdense vessel or unexpected calcification. Skull: Normal. Negative for fracture or focal lesion. Sinuses/Orbits: No acute abnormality. Mucosal thickening is again noted involving the maxillary sinuses, ethmoid air cells, frontal sinus and sphenoid sinus. Mastoid air cells are clear. Orbits are unremarkable. Other: None IMPRESSION: 1. No acute intracranial abnormalities. 2. Chronic small vessel ischemic disease and brain atrophy. 3. Chronic  sinus inflammation. Electronically Signed   By: Kerby Moors M.D.   On: 04/05/2021 06:16   CT Head Wo Contrast  Result Date: 03/22/2021 CLINICAL DATA:  Head trauma EXAM: CT HEAD WITHOUT CONTRAST TECHNIQUE: Contiguous axial images were obtained from the base of the skull through the vertex without intravenous contrast. COMPARISON:  CT head 06/06/2007 FINDINGS: Brain: No acute intracranial hemorrhage, mass effect, or herniation. No extra-axial fluid collections. No evidence of acute territorial infarct. No hydrocephalus. Mild cortical volume loss. Extensive hypodensities throughout the periventricular and subcortical white matter, likely secondary to chronic microvascular ischemic changes. Vascular: Calcified plaques in the carotid siphons. Skull: Normal. Negative for fracture or focal lesion. Sinuses/Orbits: Small air-fluid levels in the bilateral maxillary sinuses and mild mucosal thickening throughout the paranasal sinuses. Other: None. IMPRESSION: 1. No acute intracranial process identified. 2. Advanced chronic microvascular ischemic changes. 3. Small air-fluid levels in the maxillary sinuses, correlate for evidence of acute sinusitis. Electronically Signed   By: Ofilia Neas M.D.   On: 03/22/2021 15:25   CT Cervical Spine Wo Contrast  Result Date: 03/22/2021 CLINICAL DATA:  Neck trauma EXAM: CT CERVICAL SPINE WITHOUT CONTRAST TECHNIQUE: Multidetector CT imaging of the cervical spine was performed without intravenous contrast. Multiplanar CT image reconstructions were also generated. COMPARISON:  None. FINDINGS: Alignment: Grade 1 anterolisthesis of C4 on C5. Skull base and vertebrae: No acute fracture. No primary bone lesion or focal pathologic process. Soft tissues and spinal canal: No prevertebral fluid or swelling. No visible canal hematoma. Disc levels: Severe intervertebral disc space narrowing at C5-C6 with endplate sclerosis, uncovertebral spurring and prominent dorsal endplate  osteophytes. Moderate to severe disc space narrowing at C6-C7 and C7-T1 with small dorsal endplate osteophytes. Bilateral facet arthropathy. Multilevel neural foraminal narrowing most significant at C3-C4 and C5-C6. Multilevel spinal canal stenoses most significant at C5-C6. Upper chest: No acute process visualized. Other: Calcified plaques in the carotid arteries. IMPRESSION: Multilevel degenerative changes of the cervical spine with no acute fracture or subluxation identified. Electronically Signed   By: Ofilia Neas M.D.   On: 03/22/2021 15:29   MR BRAIN WO CONTRAST  Result Date: 03/26/2021 CLINICAL DATA:  Initial evaluation for acute dizziness. EXAM: MRI HEAD WITHOUT CONTRAST TECHNIQUE: Multiplanar, multiecho pulse sequences of the brain and surrounding structures were obtained without intravenous contrast. COMPARISON:  Comparison made with prior CT from 03/22/2021 FINDINGS: Brain: Diffuse prominence of the CSF containing spaces compatible with generalized age-related  cerebral atrophy. Extensive patchy and confluent T2/FLAIR hyperintensity involving the periventricular, deep, and subcortical white matter of both cerebral hemispheres, with additional more mild patchy involvement of the deep gray nuclei and pons. Changes are most consistent with advanced chronic microvascular ischemic disease. Area of encephalomalacia involving the anterior left frontal lobe consistent with a chronic left MCA distribution infarct. Few scattered tiny remote bilateral cerebellar infarcts noted as well. No abnormal foci of restricted diffusion to suggest acute or subacute ischemia. Gray-white matter differentiation maintained. No foci of susceptibility artifact to suggest acute or chronic intracranial hemorrhage. No mass lesion, midline shift or mass effect. Mild ventricular prominence related to global parenchymal volume loss without hydrocephalus. No extra-axial fluid collection. Pituitary gland suprasellar region normal.  Midline structures intact. Vascular: Major intracranial arterial vascular flow voids are maintained. Asymmetric FLAIR signal intensity noted involving the distal left transverse and sigmoid sinuses, likely distal to the vein of Labbe (series 15, image 20). While this likely reflects slow/sluggish flow, possible dural sinus thrombosis is difficult to exclude. Skull and upper cervical spine: Craniocervical junction within normal limits. Bone marrow signal intensity normal. No scalp soft tissue abnormality. Sinuses/Orbits: Patient status post bilateral ocular lens replacement. Globes and orbital soft tissues demonstrate no acute finding. Scattered mucosal thickening noted throughout the paranasal sinuses. Trace layering fluid noted within the right sphenoid sinus. No significant mastoid effusion. Inner ear structures grossly normal. Other: None. IMPRESSION: 1. No acute intracranial abnormality. 2. Asymmetric FLAIR signal intensity involving the distal left transverse and sigmoid sinuses. While this likely reflects slow/sluggish flow, possible dural sinus thrombosis is difficult to exclude. Further assessment with dedicated MRV suggested for further evaluation as clinically warranted. 3. Age-related cerebral atrophy with advanced chronic microvascular ischemic disease. Superimposed chronic left frontal infarct, with a few additional tiny remote cerebellar infarcts. Electronically Signed   By: Jeannine Boga M.D.   On: 03/26/2021 23:48   DG Chest Portable 1 View  Result Date: 04/05/2021 CLINICAL DATA:  Weakness.  COVID positive.  Shortness of breath. EXAM: PORTABLE CHEST 1 VIEW COMPARISON:  03/24/2021 and 03/22/2021. FINDINGS: Again noted are patchy densities in the right upper lung which have minimally changed. Patchy densities in left lower chest have slightly progressed and could represent acute on chronic disease. Heart is prominent for size but there are also low lung volumes. Again noted is a cardiac  recorder in the left anterior chest. Negative for pneumothorax. IMPRESSION: Bilateral patchy lung densities particularly in the left lower lung. Left lower lung densities appear to have a chronic component but cannot exclude acute on chronic disease. Findings are concerning for multifocal pneumonia. Electronically Signed   By: Markus Daft M.D.   On: 04/05/2021 10:44   MR MRV HEAD W WO CONTRAST  Result Date: 03/27/2021 CLINICAL DATA:  Suspect dural venous sinus thrombosis. EXAM: MR VENOGRAM HEAD WITHOUT AND WITH CONTRAST TECHNIQUE: Angiographic images of the intracranial venous structures were acquired using MRV technique without and with intravenous contrast. CONTRAST:  71mL GADAVIST GADOBUTROL 1 MMOL/ML IV SOLN COMPARISON:  MRI head 03/26/2021.  CT head 03/22/2021 FINDINGS: Superior sagittal sinus normal. Right transverse sinus dominant. Right transverse sinus and sigmoid sinus widely patent. Hypoplastic left transverse sinus. There is a small filling defect, 5 x 10 mm, in the mid left transverse sinus. Remainder of the left transverse sinus enhances normally. This small filling defect would not account for the finding seen on MRI yesterday which is therefore felt to be most likely due to flow related artifact. IMPRESSION: Non  dominant left transverse sinus. Increased signal on FLAIR and T2 on yesterday's study in the left transverse sinus most likely flow related artifact. There is a small filling defect left transverse sinus which could represent arachnoid granulation or flow related artifact. If symptoms progress, follow-up MR venogram with contrast may be helpful. Electronically Signed   By: Franchot Gallo M.D.   On: 03/27/2021 12:11   ECHOCARDIOGRAM COMPLETE  Result Date: 03/25/2021    ECHOCARDIOGRAM REPORT   Patient Name:   Sr. Ericson Nafziger Sr. Date of Exam: 03/25/2021 Medical Rec #:  [id number]             Height:       69.0 in Accession #:    [id number]            Weight:       196.2 lb Date of  Birth:  [birth year]             BSA:          2.049 m Patient Age:    23 years              BP:           126/75 mmHg Patient Gender: M                     HR:           57 bpm. Exam Location:  [facility] Procedure: 2D Echo, Color Doppler and Cardiac Doppler Indications:     R55 Syncope  History:         Patient has prior history of Echocardiogram examinations. Risk                  Factors:Hypertension and Dyslipidemia. Pt tested positive for                  COVID-19 on 03/22/21.  Sonographer:     Charmayne Sheer Referring Phys:  [id number] Clance Boll BERGE Diagnosing Phys: Kathlyn Sacramento MD  Sonographer Comments: Suboptimal apical window and suboptimal subcostal window. IMPRESSIONS  1. Left ventricular ejection fraction, by estimation, is 55 to 60%. The left ventricle has normal function. The left ventricle has no regional wall motion abnormalities. There is mild left ventricular hypertrophy. Left ventricular diastolic parameters are indeterminate.  2. Right ventricular systolic function is normal. The right ventricular size is normal. Tricuspid regurgitation signal is inadequate for assessing PA pressure.  3. Left atrial size was mild to moderately dilated.  4. The mitral valve is normal in structure. No evidence of mitral valve regurgitation. No evidence of mitral stenosis.  5. The aortic valve is normal in structure. Aortic valve regurgitation is not visualized. Aortic valve sclerosis/calcification is present, without any evidence of aortic stenosis. FINDINGS  Left Ventricle: Left ventricular ejection fraction, by estimation, is 55 to 60%. The left ventricle has normal function. The left ventricle has no regional wall motion abnormalities. The left ventricular internal cavity size was normal in size. There is  mild left ventricular hypertrophy. Left ventricular diastolic parameters are indeterminate. Right Ventricle: The right ventricular size is normal. No increase in right ventricular wall thickness. Right ventricular  systolic function is normal. Tricuspid regurgitation signal is inadequate for assessing PA pressure. Left Atrium: Left atrial size was mild to moderately dilated. Right Atrium: Right atrial size was normal in size. Pericardium: There is no evidence of pericardial effusion. Mitral Valve: The mitral valve is normal in structure. No evidence of mitral valve regurgitation. No  evidence of mitral valve stenosis. MV peak gradient, 6.2 mmHg. The mean mitral valve gradient is 2.0 mmHg. Tricuspid Valve: The tricuspid valve is normal in structure. Tricuspid valve regurgitation is not demonstrated. No evidence of tricuspid stenosis. Aortic Valve: The aortic valve is normal in structure. Aortic valve regurgitation is not visualized. Aortic valve sclerosis/calcification is present, without any evidence of aortic stenosis. Aortic valve mean gradient measures 2.0 mmHg. Aortic valve peak  gradient measures 3.4 mmHg. Aortic valve area, by VTI measures 2.37 cm. Pulmonic Valve: The pulmonic valve was normal in structure. Pulmonic valve regurgitation is not visualized. No evidence of pulmonic stenosis. Aorta: The aortic root is normal in size and structure. Venous: The inferior vena cava was not well visualized. IAS/Shunts: No atrial level shunt detected by color flow Doppler.  LEFT VENTRICLE PLAX 2D LVIDd:         4.48 cm   Diastology LVIDs:         3.58 cm   LV e' medial:   9.46 cm/s LV PW:         1.27 cm   LV E/e' medial: 10.0 LV IVS:        0.98 cm LVOT diam:     2.20 cm LV SV:         39 LV SV Index:   19 LVOT Area:     3.80 cm  LEFT ATRIUM           Index LA diam:      3.30 cm 1.61 cm/m LA Vol (A2C): 63.7 ml 31.08 ml/m LA Vol (A4C): 87.6 ml 42.75 ml/m  AORTIC VALVE                    PULMONIC VALVE AV Area (Vmax):    2.73 cm     PV Vmax:       0.92 m/s AV Area (Vmean):   2.61 cm     PV Vmean:      61.700 cm/s AV Area (VTI):     2.37 cm     PV VTI:        0.151 m AV Vmax:           91.80 cm/s   PV Peak grad:  3.4 mmHg AV  Vmean:          63.300 cm/s  PV Mean grad:  2.0 mmHg AV VTI:            0.165 m AV Peak Grad:      3.4 mmHg AV Mean Grad:      2.0 mmHg LVOT Vmax:         65.90 cm/s LVOT Vmean:        43.400 cm/s LVOT VTI:          0.103 m LVOT/AV VTI ratio: 0.62  AORTA Ao Root diam: 3.60 cm MITRAL VALVE MV Area (PHT): 3.17 cm    SHUNTS MV Area VTI:   1.68 cm    Systemic VTI:  0.10 m MV Peak grad:  6.2 mmHg    Systemic Diam: 2.20 cm MV Mean grad:  2.0 mmHg MV Vmax:       1.24 m/s MV Vmean:      65.5 cm/s MV Decel Time: 240 msec MV E velocity: 94.40 cm/s Kathlyn Sacramento MD Electronically signed by Kathlyn Sacramento MD Signature Date/Time: 03/25/2021/11:43:37 AM    Final      TODAY-DAY OF DISCHARGE:  Subjective:   [place] Ponder today has no headache,no  chest abdominal pain,no new weakness tingling or numbness, feels much better wants to go home today.   Objective:   Blood pressure 127/65, pulse 72, temperature 97.7 F (36.5 C), temperature source Oral, resp. rate (!) 22, SpO2 90 %.  Intake/Output Summary (Last 24 hours) at 04/15/2021 0927 Last data filed at 04/15/2021 0550 Gross per 24 hour  Intake 240 ml  Output 400 ml  Net -160 ml   There were no vitals filed for this visit.  Exam: Awake Alert, Oriented *3, No new F.N deficits, Normal affect [place].AT,PERRAL Supple Neck,No JVD, No cervical lymphadenopathy appriciated.  Symmetrical Chest wall movement, Good air movement bilaterally, CTAB RRR,No Gallops,Rubs or new Murmurs, No Parasternal Heave +ve B.Sounds, Abd Soft, Non tender, No organomegaly appriciated, No rebound -guarding or rigidity. No Cyanosis, Clubbing or edema, No new Rash or bruise   PERTINENT RADIOLOGIC STUDIES: No results found.   PERTINENT LAB RESULTS: CBC: No results for input(s): WBC, HGB, HCT, PLT in the last 72 hours. CMET CMP     Component Value Date/Time   NA 135 04/11/2021 0135   NA 139 01/07/2014 1203   K 3.7 04/11/2021 0135   K 3.8 01/07/2014 1203   CL 108 04/11/2021  0135   CL 107 01/07/2014 1203   CO2 19 (L) 04/11/2021 0135   CO2 25 01/07/2014 1203   GLUCOSE 152 (H) 04/11/2021 0135   GLUCOSE 93 01/07/2014 1203   BUN 30 (H) 04/11/2021 0135   BUN 22 (H) 01/07/2014 1203   CREATININE 1.14 04/11/2021 0135   CREATININE 1.09 01/07/2014 1203   CREATININE 1.03 09/08/2012 1055   CALCIUM 8.2 (L) 04/11/2021 0135   CALCIUM 8.7 01/07/2014 1203   PROT 6.0 (L) 04/04/2021 2254   PROT 6.9 01/07/2014 1203   ALBUMIN 2.7 (L) 04/04/2021 2254   ALBUMIN 3.7 01/07/2014 1203   AST 29 04/04/2021 2254   AST 25 01/07/2014 1203   ALT 53 (H) 04/04/2021 2254   ALT 25 01/07/2014 1203   ALKPHOS 52 04/04/2021 2254   ALKPHOS 66 01/07/2014 1203   BILITOT 1.0 04/04/2021 2254   BILITOT 0.7 01/07/2014 1203   GFRNONAA >60 04/11/2021 0135   GFRNONAA >60 01/07/2014 1203   GFRNONAA >60 08/01/2013 1422   GFRAA 59 (L) 01/11/2019 1422   GFRAA >60 01/07/2014 1203   GFRAA >60 08/01/2013 1422    GFR CrCl cannot be calculated (Unknown ideal weight.). No results for input(s): LIPASE, AMYLASE in the last 72 hours. No results for input(s): CKTOTAL, CKMB, CKMBINDEX, TROPONINI in the last 72 hours. Invalid input(s): POCBNP No results for input(s): DDIMER in the last 72 hours. No results for input(s): HGBA1C in the last 72 hours. No results for input(s): CHOL, HDL, LDLCALC, TRIG, CHOLHDL, LDLDIRECT in the last 72 hours. No results for input(s): TSH, T4TOTAL, T3FREE, THYROIDAB in the last 72 hours.  Invalid input(s): FREET3 No results for input(s): VITAMINB12, FOLATE, FERRITIN, TIBC, IRON, RETICCTPCT in the last 72 hours. Coags: No results for input(s): INR in the last 72 hours.  Invalid input(s): PT Microbiology: Recent Results (from the past 240 hour(s))  Culture, blood (routine x 2)     Status: None   Collection Time: 04/06/21  2:22 AM   Specimen: BLOOD LEFT ARM  Result Value Ref Range Status   Specimen Description BLOOD LEFT ARM  Final   Special Requests   Final    BOTTLES  DRAWN AEROBIC AND ANAEROBIC Blood Culture adequate volume   Culture   Final    NO GROWTH  5 DAYS Performed at [facility], [place] [address], [place], [place] [address]    Report Status 04/11/2021 FINAL  Final    FURTHER DISCHARGE INSTRUCTIONS:  Get Medicines reviewed and adjusted: Please take all your medications with you for your next visit with your Primary MD  Laboratory/radiological data: Please request your Primary MD to go over all hospital tests and procedure/radiological results at the follow up, please ask your Primary MD to get all Hospital records sent to his/her office.  In some cases, they will be blood work, cultures and biopsy results pending at the time of your discharge. Please request that your primary care M.D. goes through all the records of your hospital data and follows up on these results.  Also Note the following: If you experience worsening of your admission symptoms, develop shortness of breath, life threatening emergency, suicidal or homicidal thoughts you must seek medical attention immediately by calling 911 or calling your MD immediately  if symptoms less severe.  You must read complete instructions/literature along with all the possible adverse reactions/side effects for all the Medicines you take and that have been prescribed to you. Take any new Medicines after you have completely understood and accpet all the possible adverse reactions/side effects.   Do not drive when taking Pain medications or sleeping medications (Benzodaizepines)  Do not take more than prescribed Pain, Sleep and Anxiety Medications. It is not advisable to combine anxiety,sleep and pain medications without talking with your primary care practitioner  Special Instructions: If you have smoked or chewed Tobacco  in the last 2 yrs please stop smoking, stop any regular Alcohol  and or any Recreational drug use.  Wear Seat belts while driving.  Please note: You were cared for by a  hospitalist during your hospital stay. Once you are discharged, your primary care physician will handle any further medical issues. Please note that NO REFILLS for any discharge medications will be authorized once you are discharged, as it is imperative that you return to your primary care physician (or establish a relationship with a primary care physician if you do not have one) for your post hospital discharge needs so that they can reassess your need for medications and monitor your lab values.  Total Time spent coordinating discharge including counseling, education and face to face time equals 35 minutes.  SignedOren Binet 04/15/2021 9:27 AM

## 2021-04-15 NOTE — Telephone Encounter (Signed)
Transition Care Management Follow-up Telephone Call Date of discharge and from where: 04/15/21 [facility] How have you been since you were released from the hospital? weak Any questions or concerns? No  Items Reviewed: Did the pt receive and understand the discharge instructions provided? Yes  Medications obtained and verified? Yes  Other? No  Any new allergies since your discharge? No  Dietary orders reviewed? No Do you have support at home? Yes   Home Care and Equipment/Supplies: Were home health services ordered? yes If so, what is the name of the agency? [facility], has not heard from them yet  Has the agency set up a time to come to the patient's home? no Were any new equipment or medical supplies ordered?  No  Functional Questionnaire: (I = Independent and D = Dependent) ADLs: I  Bathing/Dressing- I  Meal Prep- D  Eating- I  Maintaining continence- I  Transferring/Ambulation- D  Managing Meds- I  Follow up appointments reviewed:  PCP Hospital f/u appt confirmed? Yes  Scheduled to see Dr. Raliegh Ip on 1/17 @ 2pm. [facility] f/u appt confirmed? No   Are transportation arrangements needed? No  If their condition worsens, is the pt aware to call PCP or go to the Emergency Dept.? Yes Was the patient provided with contact information for the PCP's office or ED? Yes Was to pt encouraged to call back with questions or concerns? Yes

## 2021-04-15 NOTE — Progress Notes (Signed)
Occupational Therapy Treatment Patient Details Name: Elijah Ishaq Sr. MRN: [id number] DOB: 01/17/1935 Today's Date: 04/15/2021   History of present illness 87yo male wh presented on 12/30 with persistent cough, fever, and weakness. Of note had recent hospitalization for Covid and DCed home 12/23. Now readmitted with CAP, postviral bacterial pneumonia in [facility]. PMH myasthenia gravis, advanced dementia, HTN, PAF, CHF, CKD, 2nd degree AV block on loop recorder, Covid, presyncope   OT comments  Patient received in bed with wife present. Patient and wife state that they are expected to be discharged today to home. Patient agreeable to address dressing seated on EOB. Patient donned UB and LB clothing while seated on EOBwith supervision.  Patient asked to do short walk and required min guard for safety.  Patient returned to supine following treatment stating he wants to be well rested for time to leave.  Acute OT to continue to follow while in this setting.     Recommendations for follow up therapy are one component of a multi-disciplinary discharge planning process, led by the attending physician.  Recommendations may be updated based on patient status, additional functional criteria and insurance authorization.    Follow Up Recommendations  Home health OT    Assistance Recommended at Discharge Frequent or constant Supervision/Assistance  Patient can return home with the following  Assistance with cooking/housework;Direct supervision/assist for medications management;Direct supervision/assist for financial management;Assist for transportation;Help with stairs or ramp for entrance;A little help with walking and/or transfers;A little help with bathing/dressing/bathroom   Equipment Recommendations  Wheelchair (measurements OT)    Recommendations for Other Services      Precautions / Restrictions Precautions Precautions: Fall Precaution Comments: myasthenia gravis       Mobility Bed  Mobility Overal bed mobility: Needs Assistance Bed Mobility: Supine to Sit;Sit to Supine     Supine to sit: Supervision Sit to supine: Supervision   General bed mobility comments: supervision for bed mobilty with HOB up    Transfers Overall transfer level: Needs assistance Equipment used: Rolling walker (2 wheels) Transfers: Sit to/from Stand Sit to Stand: Min guard           General transfer comment: stood from EOB with min guard     Balance Overall balance assessment: Needs assistance Sitting-balance support: Feet supported;No upper extremity supported Sitting balance-Leahy Scale: Fair Sitting balance - Comments: able to perform dressing seated on EOB                                   ADL either performed or assessed with clinical judgement   ADL Overall ADL's : Needs assistance/impaired                 Upper Body Dressing : Supervision/safety;Sitting Upper Body Dressing Details (indicate cue type and reason): performed seated on EOB Lower Body Dressing: Supervision/safety Lower Body Dressing Details (indicate cue type and reason): donned boxer briefs and pants seated on EOB with supervision             Functional mobility during ADLs: Min guard;Rolling walker (2 wheels) General ADL Comments: eager to perform dressing due to expected to discharge home today    Extremity/Trunk Assessment              Vision       Perception     Praxis      Cognition Arousal/Alertness: Awake/alert Behavior During Therapy: Flat affect;WFL for tasks assessed/performed Overall Cognitive Status:  History of cognitive impairments - at baseline                                 General Comments: motivated towards going home today          Exercises     Shoulder Instructions       General Comments      Pertinent Vitals/ Pain       Pain Assessment: Faces Faces Pain Scale: Hurts a little bit Pain Location: back Pain  Descriptors / Indicators: Discomfort Pain Intervention(s): Monitored during session  Home Living                                          Prior Functioning/Environment              Frequency  Min 2X/week        Progress Toward Goals  OT Goals(current goals can now be found in the care plan section)  Progress towards OT goals: Progressing toward goals  Acute Rehab OT Goals Patient Stated Goal: go home OT Goal Formulation: With patient Time For Goal Achievement: 04/20/21 Potential to Achieve Goals: Good ADL Goals Pt Will Perform Grooming: with supervision;standing Pt Will Perform Upper Body Dressing: with set-up;sitting Pt Will Perform Lower Body Dressing: with set-up;with supervision;sit to/from stand Pt Will Transfer to Toilet: with supervision;ambulating  Plan Discharge plan needs to be updated;Frequency remains appropriate    Co-evaluation                 AM-PAC OT "6 Clicks" Daily Activity     Outcome Measure   Help from another person eating meals?: A Little Help from another person taking care of personal grooming?: A Little Help from another person toileting, which includes using toliet, bedpan, or urinal?: A Little Help from another person bathing (including washing, rinsing, drying)?: A Little Help from another person to put on and taking off regular upper body clothing?: A Little Help from another person to put on and taking off regular lower body clothing?: A Little 6 Click Score: 18    End of Session Equipment Utilized During Treatment: Rolling walker (2 wheels)  OT Visit Diagnosis: Unsteadiness on feet (R26.81);Other abnormalities of gait and mobility (R26.89);Muscle weakness (generalized) (M62.81);Pain   Activity Tolerance Patient tolerated treatment well   Patient Left in bed;with call bell/phone within reach;with family/visitor present   Nurse Communication Mobility status        Time: 3818-2993 OT Time  Calculation (min): 22 min  Charges: OT General Charges $OT Visit: 1 Visit OT Treatments $Self Care/Home Management : 8-22 mins  Lodema Hong, [place]  Pager [phone] Office [place] 04/15/2021, 9:59 AM

## 2021-04-22 ENCOUNTER — Other Ambulatory Visit: Payer: Self-pay

## 2021-04-22 ENCOUNTER — Ambulatory Visit: Payer: Medicare Other | Admitting: Family Medicine

## 2021-04-22 ENCOUNTER — Encounter: Payer: Self-pay | Admitting: Family Medicine

## 2021-04-22 VITALS — BP 122/60 | HR 70 | Ht 69.0 in | Wt 196.0 lb

## 2021-04-22 DIAGNOSIS — D84821 Immunodeficiency due to drugs: Secondary | ICD-10-CM

## 2021-04-22 DIAGNOSIS — G7000 Myasthenia gravis without (acute) exacerbation: Secondary | ICD-10-CM

## 2021-04-22 DIAGNOSIS — T380X5A Adverse effect of glucocorticoids and synthetic analogues, initial encounter: Secondary | ICD-10-CM

## 2021-04-22 DIAGNOSIS — J1282 Pneumonia due to coronavirus disease 2019: Secondary | ICD-10-CM

## 2021-04-22 DIAGNOSIS — U071 COVID-19: Secondary | ICD-10-CM

## 2021-04-22 DIAGNOSIS — Z8616 Personal history of COVID-19: Secondary | ICD-10-CM | POA: Diagnosis not present

## 2021-04-22 DIAGNOSIS — Z7952 Long term (current) use of systemic steroids: Secondary | ICD-10-CM

## 2021-04-22 LAB — CBC WITH DIFFERENTIAL/PLATELET
Absolute Monocytes: 390 cells/uL (ref 200–950)
Basophils Absolute: 8 cells/uL (ref 0–200)
Basophils Relative: 0.1 %
Eosinophils Absolute: 0 cells/uL — ABNORMAL LOW (ref 15–500)
Eosinophils Relative: 0 %
HCT: 39.4 % (ref 38.5–50.0)
Hemoglobin: 13.4 g/dL (ref 13.2–17.1)
Lymphs Abs: 955 cells/uL (ref 850–3900)
MCH: 31.8 pg (ref 27.0–33.0)
MCHC: 34 g/dL (ref 32.0–36.0)
MCV: 93.6 fL (ref 80.0–100.0)
MPV: 10.3 fL (ref 7.5–12.5)
Monocytes Relative: 4.7 %
Neutro Abs: 6947 cells/uL (ref 1500–7800)
Neutrophils Relative %: 83.7 %
Platelets: 258 10*3/uL (ref 140–400)
RBC: 4.21 10*6/uL (ref 4.20–5.80)
RDW: 12.9 % (ref 11.0–15.0)
Total Lymphocyte: 11.5 %
WBC: 8.3 10*3/uL (ref 3.8–10.8)

## 2021-04-22 LAB — COMPLETE METABOLIC PANEL WITH GFR
AG Ratio: 1.1 (calc) (ref 1.0–2.5)
ALT: 20 U/L (ref 9–46)
AST: 27 U/L (ref 10–35)
Albumin: 3 g/dL — ABNORMAL LOW (ref 3.6–5.1)
Alkaline phosphatase (APISO): 68 U/L (ref 35–144)
BUN/Creatinine Ratio: 18 (calc) (ref 6–22)
BUN: 22 mg/dL (ref 7–25)
CO2: 23 mmol/L (ref 20–32)
Calcium: 8.9 mg/dL (ref 8.6–10.3)
Chloride: 107 mmol/L (ref 98–110)
Creat: 1.23 mg/dL — ABNORMAL HIGH (ref 0.70–1.22)
Globulin: 2.7 g/dL (calc) (ref 1.9–3.7)
Glucose, Bld: 139 mg/dL — ABNORMAL HIGH (ref 65–99)
Potassium: 4.4 mmol/L (ref 3.5–5.3)
Sodium: 139 mmol/L (ref 135–146)
Total Bilirubin: 0.5 mg/dL (ref 0.2–1.2)
Total Protein: 5.7 g/dL — ABNORMAL LOW (ref 6.1–8.1)
eGFR: 58 mL/min/{1.73_m2} — ABNORMAL LOW (ref >=60)

## 2021-04-22 MED ORDER — MOLNUPIRAVIR EUA 200MG CAPSULE: 4.0000 | ORAL_CAPSULE | Freq: Two times a day (BID) | ORAL | 0 refills | Status: AC

## 2021-04-22 NOTE — Patient Instructions (Addendum)
Thank you for coming to the office today.  Rx Molnupiravir for covid infection IF you get a positive.  Cannot take paxlovid due to interaction eliquis  We will look into the Evushield injection if we can get approved, can return in a few weeks 4-6 weeks if needed for this.   Please schedule a Follow-up Appointment to: Return in about 4 weeks (around 05/20/2021), or if symptoms worsen or fail to improve.  If you have any other questions or concerns, please feel free to call the office or send a message through [place]. You may also schedule an earlier appointment if necessary.  Additionally, you may be receiving a survey about your experience at our office within a few days to 1 week by e-mail or mail. We value your feedback.  Nobie Putnam, DO [place]

## 2021-04-22 NOTE — Progress Notes (Signed)
Electrophysiology Office Follow up Visit Note:    Date:  04/23/2021   ID:  Elijah Ponder Sr., DOB Jul 29, 1934, MRN [id number]  PCP:  Olin Hauser, DO  [place] Cardiologist:  Vickie Epley, MD  [facility] Electrophysiologist:  Vickie Epley, MD    Interval History:    Elijah Teuscher Sr. is a 87 y.o. male who presents for a follow up visit. They were last seen in clinic December 26, 2020 for dizziness.  I have seen the patient twice.  At the last appointment I implanted a loop recorder given history of syncope.  Since I last saw him he was admitted April 04, 2021 through April 15, 2021 with COVID pneumonitis.  His loop recorder monitored previously by Dr. Joan Mayans at the [facility].  Given we are monitoring his rhythm and schedule a cardioversion, the patient has requested to transfer the monitoring to our clinic which I think is reasonable.  [facility] is [facility].  The patient's wife provides much of the history today.  She tells me that he is doing better since being discharged in the hospital.  When he first came home he was unable to get up from a chair unassisted but now he is able to walk around the house with more strength.  Physical therapy is coming to the house today actually.     Past Medical History:  Diagnosis Date   Dyslipidemia    GERD (gastroesophageal reflux disease)    Glaucoma    History of stress test    a. 08/2020 ETT: Max HR 125 (91%). 4.6 METS. ECG w/ isolated PVCs, intermittent Mobitz I & II.   Hyperlipidemia    Hypertension    Insomnia    Mobitz type 1 second degree atrioventricular block    Mobitz type II atrioventricular block    Myasthenia gravis ([place]) 09/08/2012   Obesity    Ocular myasthenia gravis ([place])    Pre-syncope    a. 08/2020 [facility]: NSVT, Mobitz I, 2 episodes of high degree AVB during sleep.   Prostate cancer [facility])     Past Surgical History:  Procedure Laterality Date   CATARACT  EXTRACTION Bilateral    CHOLECYSTECTOMY     TRANSURETHRAL RESECTION OF PROSTATE      Current Medications: Current Meds  Medication Sig   acetaminophen (TYLENOL) 325 MG tablet Take 1 tablet (325 mg total) by mouth every 6 (six) hours as needed for mild pain (or Fever >/= 101).   apixaban (ELIQUIS) 5 MG TABS tablet Take 1 tablet (5 mg total) by mouth 2 (two) times daily.   donepezil (ARICEPT) 10 MG tablet Take 10 mg by mouth at bedtime.   feeding supplement, ENSURE ENLIVE, (ENSURE ENLIVE) LIQD Take 237 mLs by mouth 2 (two) times daily between meals.   latanoprost (XALATAN) 0.005 % ophthalmic solution Place 1 drop into both eyes at bedtime.   memantine (NAMENDA) 10 MG tablet Take 20 mg by mouth at bedtime.   molnupiravir EUA (LAGEVRIO) 200 mg CAPS capsule Take 4 capsules (800 mg total) by mouth 2 (two) times daily for 5 days.   omeprazole (PRILOSEC) 40 MG capsule Take 1 capsule (40 mg total) by mouth 2 (two) times daily before a meal.   predniSONE (DELTASONE) 1 MG tablet Take 2 mg by mouth daily with breakfast. (Take with 5mg  tablet to equal 7mg  total)   predniSONE (DELTASONE) 5 MG tablet Take 5 mg by mouth daily. (Take with 2mg   tablets to equal 7mg  total)   pyridostigmine (MESTINON) 60 MG tablet Take 60 mg by mouth 2 (two) times daily.   senna-docusate (SENOKOT-S) 8.6-50 MG tablet Take 1 tablet by mouth at bedtime as needed for mild constipation.   Vitamins/Minerals TABS Take 1 tablet by mouth daily.     Allergies:   Pravastatin and Simvastatin   Social History   Socioeconomic History   Marital status: Married    Spouse name: Elijah Jackson   Number of children: 3   Years of education: 16   Highest education level: Not on file  Occupational History    Comment: retired  Tobacco Use   Smoking status: Former    Types: Cigarettes    Quit date: 04/06/1970    Years since quitting: 51.0   Smokeless tobacco: Former  Scientific laboratory technician Use: Never used  Substance and Sexual Activity   Alcohol  use: Yes    Alcohol/week: 0.0 standard drinks    Comment: Consumes alcohol on occasion   Drug use: No   Sexual activity: Not on file  Other Topics Concern   Not on file  Social History Narrative   Patient lives at home with his wife Veterinary surgeon)   Retired - [employer]   [place]   Right handed.   Caffeine- four cups daily.            Social Determinants of Health   Financial Resource Strain: Low Risk    Difficulty of Paying Living Expenses: Not hard at all  Food Insecurity: No Food Insecurity   Worried About Charity fundraiser in the Last Year: Never true   [place] in the Last Year: Never true  Transportation Needs: No Transportation Needs   Lack of Transportation (Medical): No   Lack of Transportation (Non-Medical): No  Physical Activity: Inactive   Days of Exercise per Week: 0 days   Minutes of Exercise per Session: 0 min  Stress: No Stress Concern Present   Feeling of Stress : Not at all  Social Connections: Not on file     Family History: The patient's family history includes Heart attack in his father.  ROS:   Please see the history of present illness.    All other systems reviewed and are negative.  EKGs/Labs/Other Studies Reviewed:    The following studies were reviewed today:  March 25, 2021 echo Left ventricular function normal, 55 to 60% Right ventricular function normal Dilated left atrium  Loop interrogation today shows a 79% A. fib burden. EKG:  The ekg ordered today demonstrates atrial fibrillation with a ventricular rate of 88 bpm.  Frequent PVCs, monomorphic.  PVCs have a left superior axis and are positive throughout the precordium.  Recent Labs: 03/22/2021: B Natriuretic Peptide 100.7 04/22/2021: ALT 20; BUN 22; Creat 1.23; Hemoglobin 13.4; Platelets 258; Potassium 4.4; Sodium 139  Recent Lipid Panel No results found for: CHOL, TRIG, HDL, CHOLHDL, VLDL, LDLCALC, LDLDIRECT  Physical Exam:    VS:  BP 140/70 (BP Location: Right  Arm, Patient Position: Sitting, Cuff Size: Normal)    Pulse 88    Ht 5\' 9"  (1.753 m)    Wt 175 lb (79.4 kg)    SpO2 94%    BMI 25.84 kg/m     Wt Readings from Last 3 Encounters:  04/23/21 175 lb (79.4 kg)  04/22/21 196 lb (88.9 kg)  03/22/21 196 lb 3.4 oz (89 kg)     GEN:  Well nourished, well developed in  no acute distress.  Elderly HEENT: Normal NECK: No JVD; No carotid bruits LYMPHATICS: No lymphadenopathy CARDIAC: Irregularly irregular, no murmurs, rubs, gallops RESPIRATORY:  Clear to auscultation without rales, wheezing or rhonchi  ABDOMEN: Soft, non-tender, non-distended MUSCULOSKELETAL:  No edema; No deformity  SKIN: Warm and dry NEUROLOGIC:  Alert and oriented x 3 PSYCHIATRIC:  Normal affect        ASSESSMENT:    1. Persistent atrial fibrillation ([place])   2. Community acquired pneumonia, unspecified laterality   3. Syncope, unspecified syncope type   4. Dizziness   5. Myasthenia gravis ([place])    PLAN:    In order of problems listed above:  #Community-acquired pneumonia Was quite sick during this recent hospitalization.  Discharged about 1 week ago.  He is working with physical therapy.  Strength is improving.  #Atrial fibrillation Persistent Monitoring of the loop recorder shows increased burden around the time of his hospitalization and now he is just been persistent.  I would like to schedule cardioversion now that he is recovered from his acute infection.  He has been on Eliquis uninterrupted for at least the last 4 weeks.  We will get this scheduled for him in the next few weeks.  Will transition remote monitoring of his loop recorder to our clinic.  Follow-up in our clinic in about 3 months with an APP.  Can follow-up with me in about 1 year or sooner as needed.    Medication Adjustments/Labs and Tests Ordered: Current medicines are reviewed at length with the patient today.  Concerns regarding medicines are outlined above.  Orders Placed This Encounter   Procedures   EKG 12-Lead   No orders of the defined types were placed in this encounter.    Signed, Lars Mage, MD, [facility] 04/23/2021 9:06 AM    Electrophysiology [facility]

## 2021-04-22 NOTE — Progress Notes (Signed)
Subjective:    Patient ID: Elijah Ponder Sr., male    DOB: Sep 24, 1934, 87 y.o.   MRN: [id number]  Elijah Sondgeroth Sr. is a 87 y.o. male presenting on 04/22/2021 for Hospitalization Follow-up  Accompanied by wife today.  HPI   HOSPITAL FOLLOW-UP VISIT  Hospital/Location: [facility] Date of Admission: 04/04/21 Date of Discharge: 04/15/21 Transitions of care telephone call: 04/15/21  Reason for Admission: [facility] H&P and Discharge Summary have been reviewed - Patient presents today 7 days after recent hospitalization  Treated with variety of therapy with IV antibiotics, anti viral therapy, steroids, oxygen has improved and discharged.  He has help at home with [facility] PT  - Today reports overall has done well after discharge. Symptoms of COVID have resolved but still has residual weakness fatigue tired.   He is gradually improving Using walker now   I have reviewed the discharge medication list, and have reconciled the current and discharge medications today.   Current Outpatient Medications:    acetaminophen (TYLENOL) 325 MG tablet, Take 1 tablet (325 mg total) by mouth every 6 (six) hours as needed for mild pain (or Fever >/= 101)., Disp: , Rfl:    apixaban (ELIQUIS) 5 MG TABS tablet, Take 1 tablet (5 mg total) by mouth 2 (two) times daily., Disp: 60 tablet, Rfl: 0   donepezil (ARICEPT) 10 MG tablet, Take 10 mg by mouth at bedtime., Disp: , Rfl:    feeding supplement, ENSURE ENLIVE, (ENSURE ENLIVE) LIQD, Take 237 mLs by mouth 2 (two) times daily between meals., Disp: 60 Bottle, Rfl: 0   latanoprost (XALATAN) 0.005 % ophthalmic solution, Place 1 drop into both eyes at bedtime., Disp: , Rfl:    memantine (NAMENDA) 10 MG tablet, Take 20 mg by mouth at bedtime., Disp: , Rfl:    molnupiravir EUA (LAGEVRIO) 200 mg CAPS capsule, Take 4 capsules (800 mg total) by mouth 2 (two) times daily for 5 days., Disp: 40 capsule, Rfl: 0   omeprazole (PRILOSEC) 40 MG capsule, Take 1 capsule (40  mg total) by mouth 2 (two) times daily before a meal., Disp: 60 capsule, Rfl: 0   predniSONE (DELTASONE) 1 MG tablet, Take 2 mg by mouth daily with breakfast. (Take with 5mg  tablet to equal 7mg  total), Disp: , Rfl:    predniSONE (DELTASONE) 5 MG tablet, Take 5 mg by mouth daily. (Take with 2mg  tablets to equal 7mg  total), Disp: , Rfl:    pyridostigmine (MESTINON) 60 MG tablet, Take 60 mg by mouth 2 (two) times daily., Disp: , Rfl:    senna-docusate (SENOKOT-S) 8.6-50 MG tablet, Take 1 tablet by mouth at bedtime as needed for mild constipation., Disp: , Rfl:    Vitamins/Minerals TABS, Take 1 tablet by mouth daily., Disp: , Rfl:   ------------------------------------------------------------------------- Social History   Tobacco Use   Smoking status: Former    Types: Cigarettes    Quit date: 04/06/1970    Years since quitting: 51.0   Smokeless tobacco: Former  Scientific laboratory technician Use: Never used  Substance Use Topics   Alcohol use: Yes    Alcohol/week: 0.0 standard drinks    Comment: Consumes alcohol on occasion   Drug use: No    Review of Systems Per HPI unless specifically indicated above     Objective:    BP 122/60 (BP Location: Left Arm, Patient Position: Sitting, Cuff Size: Normal)    Pulse 70    Ht 5\' 9"  (1.753 m)    Wt 196  lb (88.9 kg)    BMI 28.94 kg/m   Wt Readings from Last 3 Encounters:  04/22/21 196 lb (88.9 kg)  03/22/21 196 lb 3.4 oz (89 kg)  12/26/20 194 lb 9.6 oz (88.3 kg)    Physical Exam Vitals and nursing note reviewed.  Constitutional:      General: He is not in acute distress.    Appearance: Normal appearance. He is well-developed. He is not diaphoretic.     Comments: Currently weaker and fatigued appearing 87 year old male, comfortable, cooperative  HENT:     Head: Normocephalic and atraumatic.  Eyes:     General:        Right eye: No discharge.        Left eye: No discharge.     Conjunctiva/sclera: Conjunctivae normal.  Cardiovascular:     Rate  and Rhythm: Normal rate.  Pulmonary:     Effort: Pulmonary effort is normal.  Musculoskeletal:     Comments: Uses walker, requires assistance to stand from seated  Skin:    General: Skin is warm and dry.     Findings: No erythema or rash.  Neurological:     Mental Status: He is alert and oriented to person, place, and time.  Psychiatric:        Mood and Affect: Mood normal.        Behavior: Behavior normal.        Thought Content: Thought content normal.     Comments: Well groomed, good eye contact, normal speech and thoughts     Results for orders placed or performed during the hospital encounter of 04/04/21  Resp Panel by RT-PCR (Flu A&B, Covid) Nasopharyngeal Swab   Specimen: Nasopharyngeal Swab; Nasopharyngeal(NP) swabs in vial transport medium  Result Value Ref Range   SARS Coronavirus 2 by RT PCR POSITIVE (A) NEGATIVE   Influenza A by PCR NEGATIVE NEGATIVE   Influenza B by PCR NEGATIVE NEGATIVE  Culture, blood (routine x 2)   Specimen: BLOOD LEFT ARM  Result Value Ref Range   Specimen Description BLOOD LEFT ARM    Special Requests      BOTTLES DRAWN AEROBIC AND ANAEROBIC Blood Culture adequate volume   Culture      NO GROWTH 5 DAYS Performed at [facility], [address] [place], [place] [address]    Report Status 04/11/2021 FINAL   Comprehensive metabolic panel  Result Value Ref Range   Sodium 135 135 - 145 mmol/L   Potassium 4.0 3.5 - 5.1 mmol/L   Chloride 104 98 - 111 mmol/L   CO2 23 22 - 32 mmol/L   Glucose, Bld 125 (H) 70 - 99 mg/dL   BUN 24 (H) 8 - 23 mg/dL   Creatinine, Ser 1.70 (H) 0.61 - 1.24 mg/dL   Calcium 8.2 (L) 8.9 - 10.3 mg/dL   Total Protein 6.0 (L) 6.5 - 8.1 g/dL   Albumin 2.7 (L) 3.5 - 5.0 g/dL   AST 29 15 - 41 U/L   ALT 53 (H) 0 - 44 U/L   Alkaline Phosphatase 52 38 - 126 U/L   Total Bilirubin 1.0 0.3 - 1.2 mg/dL   GFR, Estimated 39 (L) >60 mL/min   Anion gap 8 5 - 15  CBC  Result Value Ref Range   WBC 12.6 (H) 4.0 - 10.5 K/uL    RBC 4.40 4.22 - 5.81 MIL/uL   Hemoglobin 14.1 13.0 - 17.0 g/dL   HCT 41.8 39.0 - 52.0 %  MCV 95.0 80.0 - 100.0 fL   MCH 32.0 26.0 - 34.0 pg   MCHC 33.7 30.0 - 36.0 g/dL   RDW 13.1 11.5 - 15.5 %   Platelets 234 150 - 400 K/uL   nRBC 0.0 0.0 - 0.2 %  Mycoplasma pneumoniae antibody, IgM  Result Value Ref Range   Mycoplasma pneumo IgM <770 0 - 769 U/mL  Hemoglobin A1c  Result Value Ref Range   Hgb A1c MFr Bld 5.9 (H) 4.8 - 5.6 %   Mean Plasma Glucose 122.63 mg/dL  CBC  Result Value Ref Range   WBC 11.6 (H) 4.0 - 10.5 K/uL   RBC 4.56 4.22 - 5.81 MIL/uL   Hemoglobin 14.4 13.0 - 17.0 g/dL   HCT 43.8 39.0 - 52.0 %   MCV 96.1 80.0 - 100.0 fL   MCH 31.6 26.0 - 34.0 pg   MCHC 32.9 30.0 - 36.0 g/dL   RDW 13.2 11.5 - 15.5 %   Platelets 219 150 - 400 K/uL   nRBC 0.0 0.0 - 0.2 %  Basic metabolic panel  Result Value Ref Range   Sodium 134 (L) 135 - 145 mmol/L   Potassium 4.0 3.5 - 5.1 mmol/L   Chloride 105 98 - 111 mmol/L   CO2 18 (L) 22 - 32 mmol/L   Glucose, Bld 102 (H) 70 - 99 mg/dL   BUN 27 (H) 8 - 23 mg/dL   Creatinine, Ser 1.56 (H) 0.61 - 1.24 mg/dL   Calcium 8.1 (L) 8.9 - 10.3 mg/dL   GFR, Estimated 44 (L) >60 mL/min   Anion gap 11 5 - 15  SAR CoV2 Serology (COVID 19)AB(IGG)IA  Result Value Ref Range   SARS-CoV-2 Ab, IgG NON REACTIVE NON REACTIVE  CBC  Result Value Ref Range   WBC 12.6 (H) 4.0 - 10.5 K/uL   RBC 4.41 4.22 - 5.81 MIL/uL   Hemoglobin 13.9 13.0 - 17.0 g/dL   HCT 41.4 39.0 - 52.0 %   MCV 93.9 80.0 - 100.0 fL   MCH 31.5 26.0 - 34.0 pg   MCHC 33.6 30.0 - 36.0 g/dL   RDW 13.2 11.5 - 15.5 %   Platelets 208 150 - 400 K/uL   nRBC 0.0 0.0 - 0.2 %  Basic metabolic panel  Result Value Ref Range   Sodium 136 135 - 145 mmol/L   Potassium 4.2 3.5 - 5.1 mmol/L   Chloride 106 98 - 111 mmol/L   CO2 21 (L) 22 - 32 mmol/L   Glucose, Bld 101 (H) 70 - 99 mg/dL   BUN 22 8 - 23 mg/dL   Creatinine, Ser 1.38 (H) 0.61 - 1.24 mg/dL   Calcium 8.1 (L) 8.9 - 10.3 mg/dL    GFR, Estimated 50 (L) >60 mL/min   Anion gap 9 5 - 15  CBC  Result Value Ref Range   WBC 11.1 (H) 4.0 - 10.5 K/uL   RBC 4.03 (L) 4.22 - 5.81 MIL/uL   Hemoglobin 13.1 13.0 - 17.0 g/dL   HCT 37.3 (L) 39.0 - 52.0 %   MCV 92.6 80.0 - 100.0 fL   MCH 32.5 26.0 - 34.0 pg   MCHC 35.1 30.0 - 36.0 g/dL   RDW 13.2 11.5 - 15.5 %   Platelets 197 150 - 400 K/uL   nRBC 0.0 0.0 - 0.2 %  Basic metabolic panel  Result Value Ref Range   Sodium 133 (L) 135 - 145 mmol/L   Potassium 4.0 3.5 - 5.1 mmol/L  Chloride 104 98 - 111 mmol/L   CO2 20 (L) 22 - 32 mmol/L   Glucose, Bld 115 (H) 70 - 99 mg/dL   BUN 26 (H) 8 - 23 mg/dL   Creatinine, Ser 1.16 0.61 - 1.24 mg/dL   Calcium 7.9 (L) 8.9 - 10.3 mg/dL   GFR, Estimated >60 >60 mL/min   Anion gap 9 5 - 15  CBC  Result Value Ref Range   WBC 8.6 4.0 - 10.5 K/uL   RBC 4.10 (L) 4.22 - 5.81 MIL/uL   Hemoglobin 12.8 (L) 13.0 - 17.0 g/dL   HCT 38.3 (L) 39.0 - 52.0 %   MCV 93.4 80.0 - 100.0 fL   MCH 31.2 26.0 - 34.0 pg   MCHC 33.4 30.0 - 36.0 g/dL   RDW 13.6 11.5 - 15.5 %   Platelets 202 150 - 400 K/uL   nRBC 0.0 0.0 - 0.2 %  Basic metabolic panel  Result Value Ref Range   Sodium 133 (L) 135 - 145 mmol/L   Potassium 4.2 3.5 - 5.1 mmol/L   Chloride 105 98 - 111 mmol/L   CO2 15 (L) 22 - 32 mmol/L   Glucose, Bld 98 70 - 99 mg/dL   BUN 37 (H) 8 - 23 mg/dL   Creatinine, Ser 1.35 (H) 0.61 - 1.24 mg/dL   Calcium 8.1 (L) 8.9 - 10.3 mg/dL   GFR, Estimated 52 (L) >60 mL/min   Anion gap 13 5 - 15  C-reactive protein  Result Value Ref Range   CRP 19.7 (H) <1.0 mg/dL  Procalcitonin - Baseline  Result Value Ref Range   Procalcitonin 0.40 ng/mL  D-dimer, quantitative  Result Value Ref Range   D-Dimer, Quant 1.83 (H) 0.00 - 0.50 ug/mL-FEU  CBC  Result Value Ref Range   WBC 7.0 4.0 - 10.5 K/uL   RBC 3.97 (L) 4.22 - 5.81 MIL/uL   Hemoglobin 12.7 (L) 13.0 - 17.0 g/dL   HCT 36.5 (L) 39.0 - 52.0 %   MCV 91.9 80.0 - 100.0 fL   MCH 32.0 26.0 - 34.0 pg   MCHC  34.8 30.0 - 36.0 g/dL   RDW 13.7 11.5 - 15.5 %   Platelets 215 150 - 400 K/uL   nRBC 0.0 0.0 - 0.2 %  Basic metabolic panel  Result Value Ref Range   Sodium 134 (L) 135 - 145 mmol/L   Potassium 3.9 3.5 - 5.1 mmol/L   Chloride 108 98 - 111 mmol/L   CO2 19 (L) 22 - 32 mmol/L   Glucose, Bld 94 70 - 99 mg/dL   BUN 32 (H) 8 - 23 mg/dL   Creatinine, Ser 1.19 0.61 - 1.24 mg/dL   Calcium 8.3 (L) 8.9 - 10.3 mg/dL   GFR, Estimated >60 >60 mL/min   Anion gap 7 5 - 15  C-reactive protein  Result Value Ref Range   CRP 16.7 (H) <1.0 mg/dL  C-reactive protein  Result Value Ref Range   CRP 16.1 (H) <1.0 mg/dL  Basic metabolic panel  Result Value Ref Range   Sodium 135 135 - 145 mmol/L   Potassium 3.7 3.5 - 5.1 mmol/L   Chloride 108 98 - 111 mmol/L   CO2 19 (L) 22 - 32 mmol/L   Glucose, Bld 152 (H) 70 - 99 mg/dL   BUN 30 (H) 8 - 23 mg/dL   Creatinine, Ser 1.14 0.61 - 1.24 mg/dL   Calcium 8.2 (L) 8.9 - 10.3 mg/dL   GFR, Estimated >60 >60  mL/min   Anion gap 8 5 - 15  C-reactive protein  Result Value Ref Range   CRP 11.5 (H) <1.0 mg/dL  C-reactive protein  Result Value Ref Range   CRP 10.6 (H) <1.0 mg/dL  Troponin I (High Sensitivity)  Result Value Ref Range   Troponin I (High Sensitivity) 33 (H) <18 ng/L  Troponin I (High Sensitivity)  Result Value Ref Range   Troponin I (High Sensitivity) 42 (H) <18 ng/L      Assessment & Plan:   Problem List Items Addressed This Visit     Pneumonia due to COVID-19 virus - Primary   Relevant Medications   molnupiravir EUA (LAGEVRIO) 200 mg CAPS capsule   Other Relevant Orders   COMPLETE METABOLIC PANEL WITH GFR   CBC with Differential/Platelet   Other Visit Diagnoses     History of COVID-19       Relevant Medications   molnupiravir EUA (LAGEVRIO) 200 mg CAPS capsule   Other Relevant Orders   COMPLETE METABOLIC PANEL WITH GFR   CBC with Differential/Platelet       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  Future we can try to get the Evushield injection therapy for him as well. Will look into cost coverage next.  Labs today CMET + CBC as requested by hospital  Keep [facility] therapy, gradual improving. Reassurance today  Meds ordered this encounter  Medications   molnupiravir EUA (LAGEVRIO) 200 mg CAPS capsule    Sig: Take 4 capsules (800 mg total) by mouth 2 (two) times daily for 5 days.    Dispense:  40 capsule    Refill:  0    Follow up plan: Return in about 4 weeks (around 05/20/2021), or if symptoms worsen or fail to improve.   Nobie Putnam, [facility] 04/22/2021, 2:16 PM

## 2021-04-22 NOTE — H&P (View-Only) (Signed)
Electrophysiology Office Follow up Visit Note:    Date:  04/23/2021   ID:  Elijah Ponder Sr., DOB October 02, 1934, MRN [id number]  PCP:  Olin Hauser, DO  [place] Cardiologist:  Vickie Epley, MD  [facility] Electrophysiologist:  Vickie Epley, MD    Interval History:    Elijah Merrick Sr. is a 87 y.o. male who presents for a follow up visit. They were last seen in clinic December 26, 2020 for dizziness.  I have seen the patient twice.  At the last appointment I implanted a loop recorder given history of syncope.  Since I last saw him he was admitted April 04, 2021 through April 15, 2021 with COVID pneumonitis.  His loop recorder monitored previously by Dr. Joan Mayans at the [facility].  Given we are monitoring his rhythm and schedule a cardioversion, the patient has requested to transfer the monitoring to our clinic which I think is reasonable.  [facility] see if [facility] is possible.  The patient's wife provides much of the history today.  She tells me that he is doing better since being discharged in the hospital.  When he first came home he was unable to get up from a chair unassisted but now he is able to walk around the house with more strength.  Physical therapy is coming to the house today actually.     Past Medical History:  Diagnosis Date   Dyslipidemia    GERD (gastroesophageal reflux disease)    Glaucoma    History of stress test    a. 08/2020 ETT: Max HR 125 (91%). 4.6 METS. ECG w/ isolated PVCs, intermittent Mobitz I & II.   Hyperlipidemia    Hypertension    Insomnia    Mobitz type 1 second degree atrioventricular block    Mobitz type II atrioventricular block    Myasthenia gravis ([place]) 09/08/2012   Obesity    Ocular myasthenia gravis ([place])    Pre-syncope    a. 08/2020 [facility]: NSVT, Mobitz I, 2 episodes of high degree AVB during sleep.   Prostate cancer [facility])     Past Surgical History:  Procedure Laterality Date   CATARACT  EXTRACTION Bilateral    CHOLECYSTECTOMY     TRANSURETHRAL RESECTION OF PROSTATE      Current Medications: Current Meds  Medication Sig   acetaminophen (TYLENOL) 325 MG tablet Take 1 tablet (325 mg total) by mouth every 6 (six) hours as needed for mild pain (or Fever >/= 101).   apixaban (ELIQUIS) 5 MG TABS tablet Take 1 tablet (5 mg total) by mouth 2 (two) times daily.   donepezil (ARICEPT) 10 MG tablet Take 10 mg by mouth at bedtime.   feeding supplement, ENSURE ENLIVE, (ENSURE ENLIVE) LIQD Take 237 mLs by mouth 2 (two) times daily between meals.   latanoprost (XALATAN) 0.005 % ophthalmic solution Place 1 drop into both eyes at bedtime.   memantine (NAMENDA) 10 MG tablet Take 20 mg by mouth at bedtime.   molnupiravir EUA (LAGEVRIO) 200 mg CAPS capsule Take 4 capsules (800 mg total) by mouth 2 (two) times daily for 5 days.   omeprazole (PRILOSEC) 40 MG capsule Take 1 capsule (40 mg total) by mouth 2 (two) times daily before a meal.   predniSONE (DELTASONE) 1 MG tablet Take 2 mg by mouth daily with breakfast. (Take with 5mg  tablet to equal 7mg  total)   predniSONE (DELTASONE) 5 MG tablet Take 5 mg by mouth daily. (Take with 2mg   tablets to equal 7mg  total)   pyridostigmine (MESTINON) 60 MG tablet Take 60 mg by mouth 2 (two) times daily.   senna-docusate (SENOKOT-S) 8.6-50 MG tablet Take 1 tablet by mouth at bedtime as needed for mild constipation.   Vitamins/Minerals TABS Take 1 tablet by mouth daily.     Allergies:   Pravastatin and Simvastatin   Social History   Socioeconomic History   Marital status: Married    Spouse name: Crystal   Number of children: 3   Years of education: 16   Highest education level: Not on file  Occupational History    Comment: retired  Tobacco Use   Smoking status: Former    Types: Cigarettes    Quit date: 04/06/1970    Years since quitting: 51.0   Smokeless tobacco: Former  Scientific laboratory technician Use: Never used  Substance and Sexual Activity   Alcohol  use: Yes    Alcohol/week: 0.0 standard drinks    Comment: Consumes alcohol on occasion   Drug use: No   Sexual activity: Not on file  Other Topics Concern   Not on file  Social History Narrative   Patient lives at home with his wife Veterinary surgeon)   Retired - [employer]   [place]   Right handed.   Caffeine- four cups daily.            Social Determinants of Health   Financial Resource Strain: Low Risk    Difficulty of Paying Living Expenses: Not hard at all  Food Insecurity: No Food Insecurity   Worried About Charity fundraiser in the Last Year: Never true   [place] in the Last Year: Never true  Transportation Needs: No Transportation Needs   Lack of Transportation (Medical): No   Lack of Transportation (Non-Medical): No  Physical Activity: Inactive   Days of Exercise per Week: 0 days   Minutes of Exercise per Session: 0 min  Stress: No Stress Concern Present   Feeling of Stress : Not at all  Social Connections: Not on file     Family History: The patient's family history includes Heart attack in his father.  ROS:   Please see the history of present illness.    All other systems reviewed and are negative.  EKGs/Labs/Other Studies Reviewed:    The following studies were reviewed today:  March 25, 2021 echo Left ventricular function normal, 55 to 60% Right ventricular function normal Dilated left atrium  Loop interrogation today shows a 79% A. fib burden. EKG:  The ekg ordered today demonstrates atrial fibrillation with a ventricular rate of 88 bpm.  Frequent PVCs, monomorphic.  PVCs have a left superior axis and are positive throughout the precordium.  Recent Labs: 03/22/2021: B Natriuretic Peptide 100.7 04/22/2021: ALT 20; BUN 22; Creat 1.23; Hemoglobin 13.4; Platelets 258; Potassium 4.4; Sodium 139  Recent Lipid Panel No results found for: CHOL, TRIG, HDL, CHOLHDL, VLDL, LDLCALC, LDLDIRECT  Physical Exam:    VS:  BP 140/70 (BP Location: Right  Arm, Patient Position: Sitting, Cuff Size: Normal)    Pulse 88    Ht 5\' 9"  (1.753 m)    Wt 175 lb (79.4 kg)    SpO2 94%    BMI 25.84 kg/m     Wt Readings from Last 3 Encounters:  04/23/21 175 lb (79.4 kg)  04/22/21 196 lb (88.9 kg)  03/22/21 196 lb 3.4 oz (89 kg)     GEN:  Well nourished, well developed in  no acute distress.  Elderly HEENT: Normal NECK: No JVD; No carotid bruits LYMPHATICS: No lymphadenopathy CARDIAC: Irregularly irregular, no murmurs, rubs, gallops RESPIRATORY:  Clear to auscultation without rales, wheezing or rhonchi  ABDOMEN: Soft, non-tender, non-distended MUSCULOSKELETAL:  No edema; No deformity  SKIN: Warm and dry NEUROLOGIC:  Alert and oriented x 3 PSYCHIATRIC:  Normal affect        ASSESSMENT:    1. Persistent atrial fibrillation ([place])   2. Community acquired pneumonia, unspecified laterality   3. Syncope, unspecified syncope type   4. Dizziness   5. Myasthenia gravis ([place])    PLAN:    In order of problems listed above:  #Community-acquired pneumonia Was quite sick during this recent hospitalization.  Discharged about 1 week ago.  He is working with physical therapy.  Strength is improving.  #Atrial fibrillation Persistent Monitoring of the loop recorder shows increased burden around the time of his hospitalization and now he is just been persistent.  I would like to schedule cardioversion now that he is recovered from his acute infection.  He has been on Eliquis uninterrupted for at least the last 4 weeks.  We will get this scheduled for him in the next few weeks.  Will transition remote monitoring of his loop recorder to our clinic.  Follow-up in our clinic in about 3 months with an APP.  Can follow-up with me in about 1 year or sooner as needed.    Medication Adjustments/Labs and Tests Ordered: Current medicines are reviewed at length with the patient today.  Concerns regarding medicines are outlined above.  Orders Placed This Encounter   Procedures   EKG 12-Lead   No orders of the defined types were placed in this encounter.    Signed, Lars Mage, MD, [facility], [facility] 04/23/2021 9:06 AM    Electrophysiology [facility]

## 2021-04-23 ENCOUNTER — Ambulatory Visit (INDEPENDENT_AMBULATORY_CARE_PROVIDER_SITE_OTHER): Payer: Medicare Other | Admitting: Cardiology

## 2021-04-23 ENCOUNTER — Encounter: Payer: Self-pay | Admitting: Cardiology

## 2021-04-23 ENCOUNTER — Encounter: Payer: Self-pay | Admitting: *Deleted

## 2021-04-23 VITALS — BP 140/70 | HR 88 | Ht 69.0 in | Wt 175.0 lb

## 2021-04-23 DIAGNOSIS — R55 Syncope and collapse: Secondary | ICD-10-CM | POA: Diagnosis not present

## 2021-04-23 DIAGNOSIS — I4819 Other persistent atrial fibrillation: Secondary | ICD-10-CM

## 2021-04-23 DIAGNOSIS — Z01818 Encounter for other preprocedural examination: Secondary | ICD-10-CM | POA: Diagnosis not present

## 2021-04-23 DIAGNOSIS — R42 Dizziness and giddiness: Secondary | ICD-10-CM | POA: Diagnosis not present

## 2021-04-23 DIAGNOSIS — G7000 Myasthenia gravis without (acute) exacerbation: Secondary | ICD-10-CM | POA: Diagnosis not present

## 2021-04-23 DIAGNOSIS — J189 Pneumonia, unspecified organism: Secondary | ICD-10-CM | POA: Diagnosis not present

## 2021-04-23 NOTE — Patient Instructions (Addendum)
Your physician recommends that you continue on your current medications as directed. Please refer to the Current Medication list given to you today. *If you need a refill on your cardiac medications before your next appointment, please call your pharmacy*  Lab Work: CBC, BMP If you have labs (blood work) drawn today and your tests are completely normal, you will receive your results only by: [place] (if you have MyChart) OR A paper copy in the mail If you have any lab test that is abnormal or we need to change your treatment, we will call you to review the results.  Testing/Procedures: Your physician has recommended that you have a Cardioversion (DCCV). Electrical Cardioversion uses a jolt of electricity to your heart either through paddles or wired patches attached to your chest. This is a controlled, usually prescheduled, procedure. Defibrillation is done under light anesthesia in the hospital, and you usually go home the day of the procedure. This is done to get your heart back into a normal rhythm. You are not awake for the procedure. Please see the instruction sheet given to you today.   Follow-Up: At [facility], you and your health needs are our priority.  As part of our continuing mission to provide you with exceptional heart care, we have created designated Provider Care Teams.  These Care Teams include your primary Cardiologist (physician) and Advanced Practice Providers (APPs -  Physician Assistants and Nurse Practitioners) who all work together to provide you with the care you need, when you need it.  Your physician wants you to follow-up in: 3 months with or one of the following Advanced Practice Providers on your designated Care Team:    Ignacia Bayley, NP Christell Faith PA Cadence [place] PA  12 months with Lars Mage     You will receive a reminder letter in the mail two months in advance. If you don't receive a letter, please call our office to schedule the follow-up  appointment.  We recommend signing up for the patient portal called "MyChart".  Sign up information is provided on this After Visit Summary.  MyChart is used to connect with patients for Virtual Visits (Telemedicine).  Patients are able to view lab/test results, encounter notes, upcoming appointments, etc.  Non-urgent messages can be sent to your provider as well.   To learn more about what you can do with MyChart, go to [email or web address].    Any Other Special Instructions Will Be Listed Below (If Applicable)

## 2021-04-24 ENCOUNTER — Other Ambulatory Visit: Payer: Self-pay

## 2021-04-24 ENCOUNTER — Emergency Department
Admission: EM | Admit: 2021-04-24 | Discharge: 2021-04-24 | Disposition: A | Discharge status: Home / Self Care | Payer: No Typology Code available for payment source | Attending: Emergency Medicine | Admitting: Emergency Medicine

## 2021-04-24 ENCOUNTER — Encounter: Payer: Self-pay | Admitting: Emergency Medicine

## 2021-04-24 DIAGNOSIS — Z7901 Long term (current) use of anticoagulants: Secondary | ICD-10-CM | POA: Insufficient documentation

## 2021-04-24 DIAGNOSIS — R55 Syncope and collapse: Secondary | ICD-10-CM | POA: Insufficient documentation

## 2021-04-24 LAB — CBC
HCT: 41.5 % (ref 39.0–52.0)
Hematocrit: 40.7 % (ref 37.5–51.0)
Hemoglobin: 14 g/dL (ref 13.0–17.7)
Hemoglobin: 13.9 g/dL (ref 13.0–17.0)
MCH: 31.5 pg (ref 26.6–33.0)
MCH: 31.8 pg (ref 26.0–34.0)
MCHC: 34.4 g/dL (ref 31.5–35.7)
MCHC: 33.5 g/dL (ref 30.0–36.0)
MCV: 92 fL (ref 79–97)
MCV: 95 fL (ref 80.0–100.0)
Platelets: 269 10*3/uL (ref 150–450)
Platelets: 233 10*3/uL (ref 150–400)
RBC: 4.44 x10E6/uL (ref 4.14–5.80)
RBC: 4.37 MIL/uL (ref 4.22–5.81)
RDW: 13.1 % (ref 11.6–15.4)
RDW: 13.8 % (ref 11.5–15.5)
WBC: 7.5 10*3/uL (ref 3.4–10.8)
WBC: 7 10*3/uL (ref 4.0–10.5)
nRBC: 0 % (ref 0.0–0.2)

## 2021-04-24 LAB — BASIC METABOLIC PANEL
Anion gap: 9 (ref 5–15)
BUN/Creatinine Ratio: 17 (ref 10–24)
BUN: 18 mg/dL (ref 8–27)
BUN: 21 mg/dL (ref 8–23)
CO2: 19 mmol/L — ABNORMAL LOW (ref 20–29)
CO2: 25 mmol/L (ref 22–32)
Calcium: 8.9 mg/dL (ref 8.6–10.2)
Calcium: 8.7 mg/dL — ABNORMAL LOW (ref 8.9–10.3)
Chloride: 110 mmol/L — ABNORMAL HIGH (ref 96–106)
Chloride: 106 mmol/L (ref 98–111)
Creatinine, Ser: 1.07 mg/dL (ref 0.76–1.27)
Creatinine, Ser: 1.34 mg/dL — ABNORMAL HIGH (ref 0.61–1.24)
GFR, Estimated: 52 mL/min — ABNORMAL LOW (ref >60)
Glucose, Bld: 132 mg/dL — ABNORMAL HIGH (ref 70–99)
Glucose: 163 mg/dL — ABNORMAL HIGH (ref 70–99)
Potassium: 3.9 mmol/L (ref 3.5–5.2)
Potassium: 4.2 mmol/L (ref 3.5–5.1)
Sodium: 146 mmol/L — ABNORMAL HIGH (ref 134–144)
Sodium: 140 mmol/L (ref 135–145)
eGFR: 68 mL/min/{1.73_m2} (ref >59)

## 2021-04-24 NOTE — Discharge Instructions (Signed)
As we discussed, please be careful with hot showers causing lower blood pressure.  Be careful changing positions quickly.  Return to the ED with any other episodes of passing out.

## 2021-04-24 NOTE — ED Triage Notes (Signed)
Pt comes into the ED via [facility] from home c/o near syncopal episode after getting out of the shower.  Pt recently diagnosed with a-fib.  Pt initial BP for EMS was 90/60, but increased to 115/68 after 200 of IVF.  CBG 168.  Pt denies any chest pain, SHOB, or current dizziness.

## 2021-04-24 NOTE — ED Provider Notes (Signed)
[facility] Provider Note    Event Date/Time   First MD Initiated Contact with Patient 04/24/21 1116     (approximate)   History   Near Syncope   HPI  Elijah Peel Sr. is a 87 y.o. male who presents to the ED for evaluation of Near Syncope   I review outpatient cardiology visit from yesterday.  History of persistent A. fib with upcoming cardioversion scheduled the beginning of February.  Anticoagulated on Eliquis.  Patient presents to the ED, accompanied by his wife who provides some supplemental history, for evaluation of dizziness and a possible syncopal episode.  Patient lives at home and is ambulatory with a walker.   Patient reports feeling generalized weakness and presyncope after getting out of a hot shower today, he sat down and feels better now.  Denies any recurrence of symptoms or coexisting symptoms alongside the dizziness.  Denies any headache, chest pain, shortness of breath, syncope, falls or trauma.  Reports feeling better now and has no complaints.  He is pleasantly watching television.  Has not eaten any food yet today.  Wife reports helping him out of the shower today and he seemed weak and dizzy after getting out of the shower.  She was holding him and sat him down on the toilet when she is concerned that he may have passed out for up to 1 minute.  No seizure activity he came to quickly thereafter and has been at his baseline since then.  Physical Exam   Triage Vital Signs: ED Triage Vitals [04/24/21 1040]  Enc Vitals Group     BP 94/66     Pulse Rate 77     Resp 18     Temp 97.9 F (36.6 C)     Temp Source Oral     SpO2 92 %     Weight 175 lb (79.4 kg)     Height 5\' 9"  (1.753 m)     Head Circumference      Peak Flow      Pain Score 0     Pain Loc      Pain Edu?      Excl. in [place]?     Most recent vital signs: Vitals:   04/24/21 1126 04/24/21 1433  BP: 119/67 119/68  Pulse: 61 83  Resp: 20 20  Temp:    SpO2: 92% 94%     General: Awake, no distress.  Pleasant and conversational in full sentences. CV:  Good peripheral perfusion.  A. fib with controlled rates Resp:  Normal effort.  Abd:  No distention.  MSK:  No deformity noted.  Neuro:  No focal deficits appreciated. Cranial nerves II through XII intact 5/5 strength and sensation in all 4 extremities Other:     ED Results / Procedures / Treatments   Labs (all labs ordered are listed, but only abnormal results are displayed) Labs Reviewed  BASIC METABOLIC PANEL - Abnormal; Notable for the following components:      Result Value   Glucose, Bld 132 (*)    Creatinine, Ser 1.34 (*)    Calcium 8.7 (*)    GFR, Estimated 52 (*)    All other components within normal limits  CBC  URINALYSIS, ROUTINE W REFLEX MICROSCOPIC    EKG A. fib, rate of 67 bpm.  Normal axis and intervals.  Couple PVCs.  No ischemic features.  RADIOLOGY   Official radiology report(s): No results found.  PROCEDURES and INTERVENTIONS:  .1-3 Lead EKG  Interpretation Performed by: Vladimir Crofts, MD Authorized by: Vladimir Crofts, MD     Interpretation: normal     ECG rate:  66   ECG rate assessment: normal     Rhythm: atrial fibrillation     Ectopy: none     Conduction: normal    Medications - No data to display   IMPRESSION / MDM / [place] / ED COURSE  I reviewed the triage vital signs and the nursing notes.  87 year old male presents to the ED after an episode of dizziness after hot shower, likely vasovagal.  He looks clinically well to me and has normal vitals on room air.  Normal examination without evidence of neurologic or vascular deficits.  EKG reassuring with known A. fib without ischemic features or concerning interval changes.  Blood work with CKD around baseline and normal CBC.  We discussed finishing the 500 cc fluid bolus from EMS, p.o. challenge and ambulatory trial with the expectation of outpatient management as long as there are no concerning  or recurrent features.  Clinical Course as of 04/24/21 1443  Thu Apr 24, 2021  1442 Patient got up and ambulated down the hall and back to his room with a walker at his baseline.  No dizziness, syncope or symptoms.  He is requesting discharge, which I think is reasonable.  We discussed management at home of vasovagal symptoms, hot showers and position changes.  We discussed return precautions for the ED. [DS]    Clinical Course User Index [DS] Vladimir Crofts, MD     FINAL CLINICAL IMPRESSION(S) / ED DIAGNOSES   Final diagnoses:  Near syncope  Vasovagal episode     Rx / DC Orders   ED Discharge Orders     None        Note:  This document was prepared using Dragon voice recognition software and may include unintentional dictation errors.   Vladimir Crofts, MD 04/24/21 [phone]

## 2021-04-25 ENCOUNTER — Telehealth: Payer: Self-pay

## 2021-04-25 NOTE — Telephone Encounter (Signed)
Attempted to contact patient to make sure it was ok to move remote monitoring to [facility] as in October [facility] the [facility] recorder

## 2021-04-25 NOTE — Telephone Encounter (Signed)
-----   Message from Darrell Jewel, RN sent at 04/25/2021  1:30 PM EST ----- Per MD Quentin Ore. Request to have remote for ILR moved from [place] to [facility].  Thank you  Otila Kluver

## 2021-04-27 IMAGING — CR DG ELBOW COMPLETE 3+V*L*
4 series · 4 of 4 positions shown · non-contrast
Comparison: None.

CLINICAL DATA: Recent fall through glass window with laceration,
initial encounter

EXAM:
LEFT ELBOW - COMPLETE 3+ VIEW

[elbow ap]
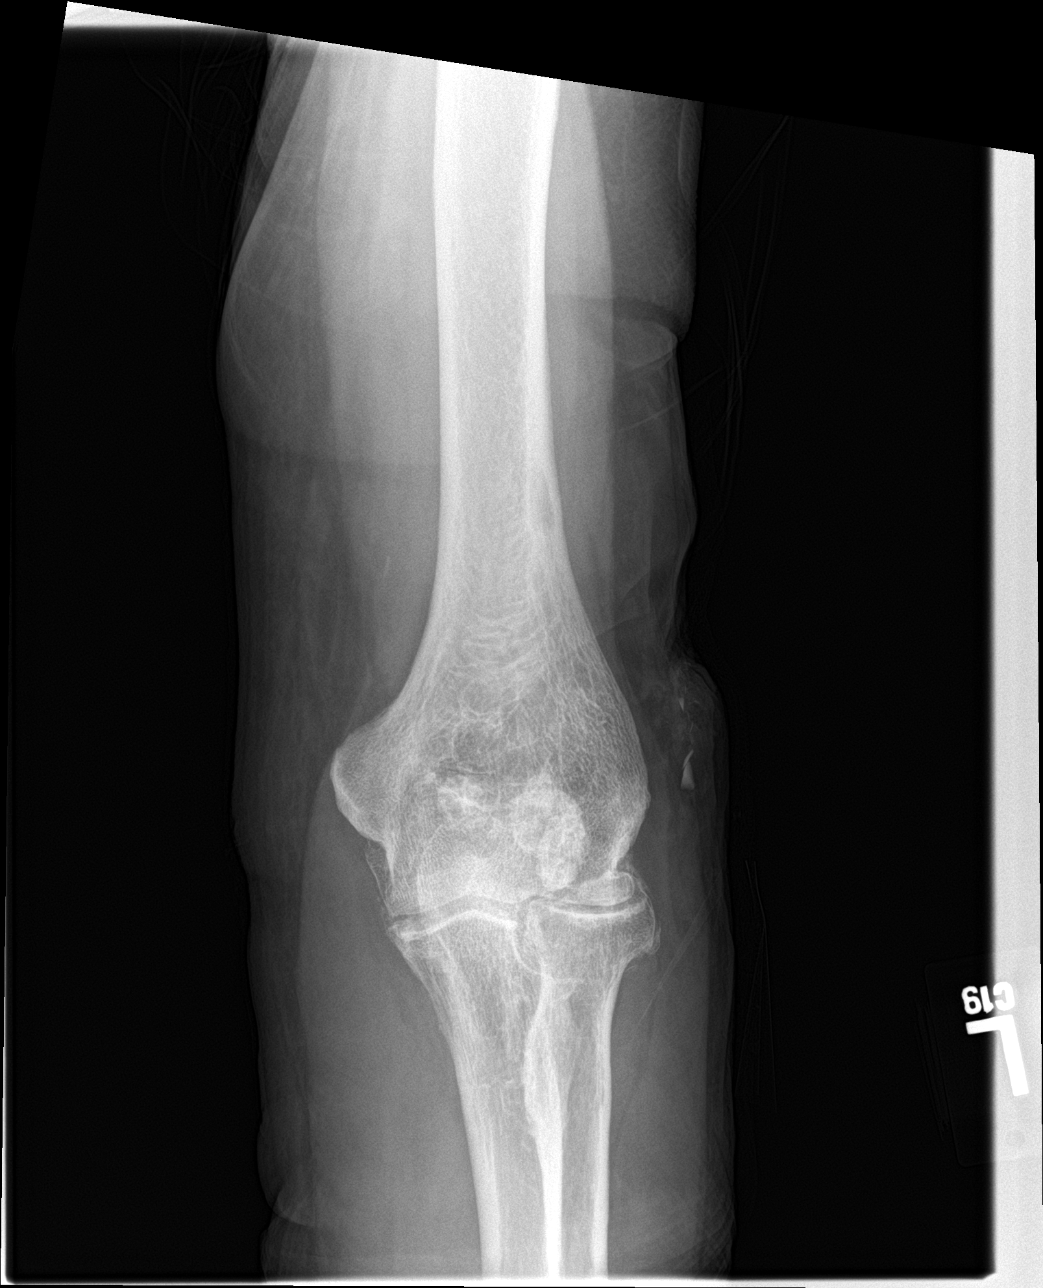

[elbow obl (1 of 2)]
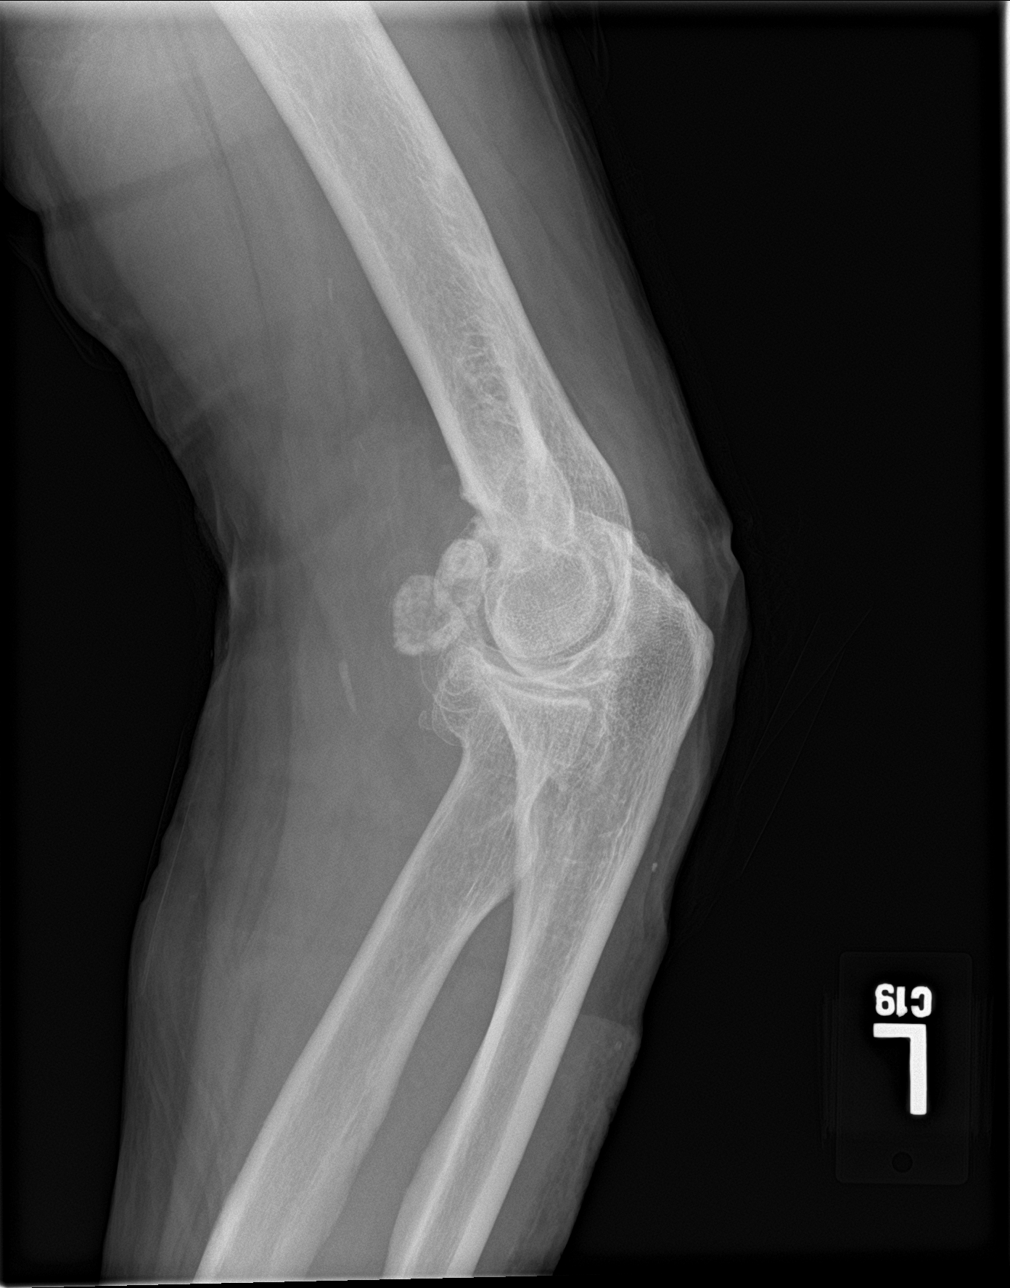

[elbow obl (2 of 2)]
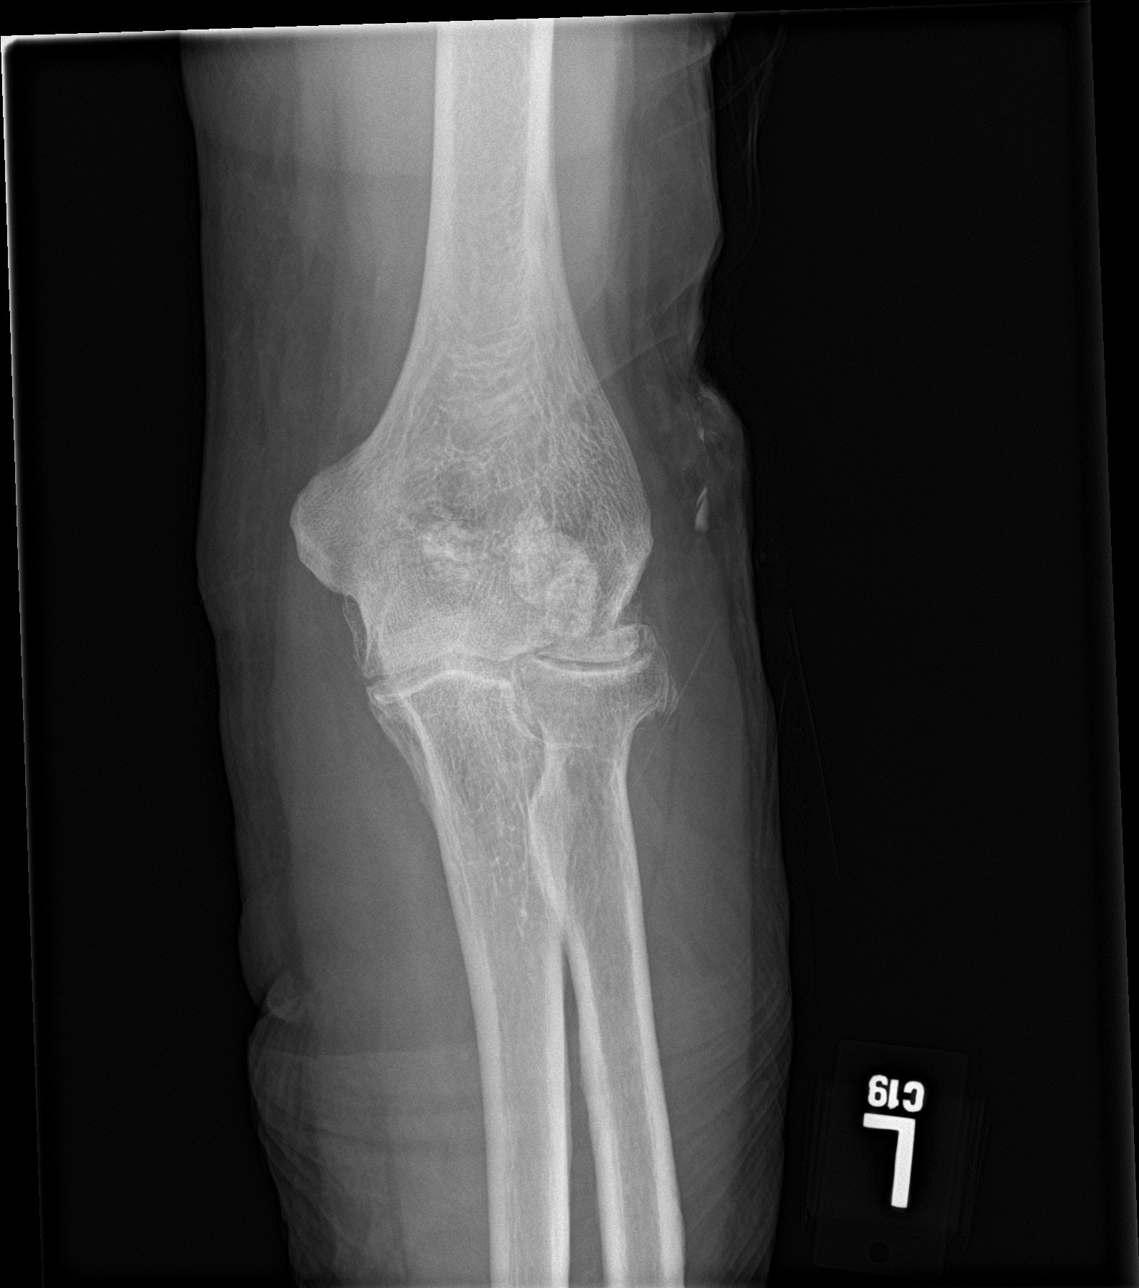

[elbow lat]
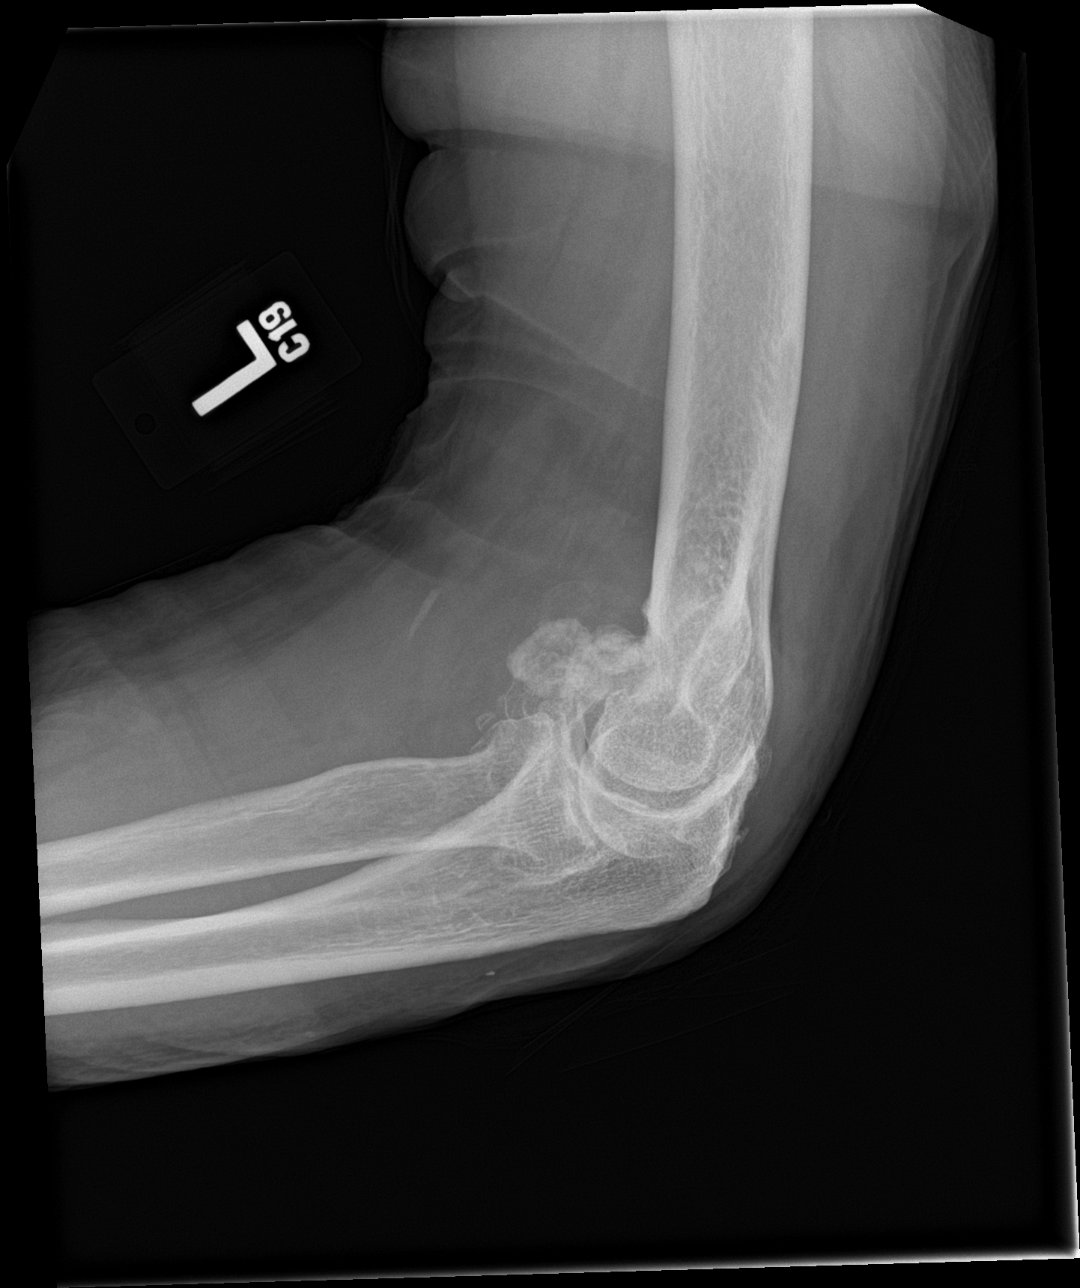

[4 of 4 positions shown; findings below may reference images not displayed]

FINDINGS: Degenerative changes about the elbow joint are noted. Calcified
loose bodies within the anterior joint space are seen. No acute
fracture or dislocation is noted. There are multiple radiopaque
densities identified within the lateral aspect of the soft tissues
adjacent to the elbow joint consistent with glass shards.
IMPRESSION: Chronic degenerative changes about the elbow joint.

Soft tissue changes consistent with the recent injury with multiple
glass shards lateral.

## 2021-04-27 IMAGING — CR DG HAND COMPLETE 3+V*R*
3 series · 3 of 3 positions shown · non-contrast
Comparison: None.

CLINICAL DATA: Status post fall, laceration

EXAM:
RIGHT HAND - COMPLETE 3+ VIEW

[hand ap]
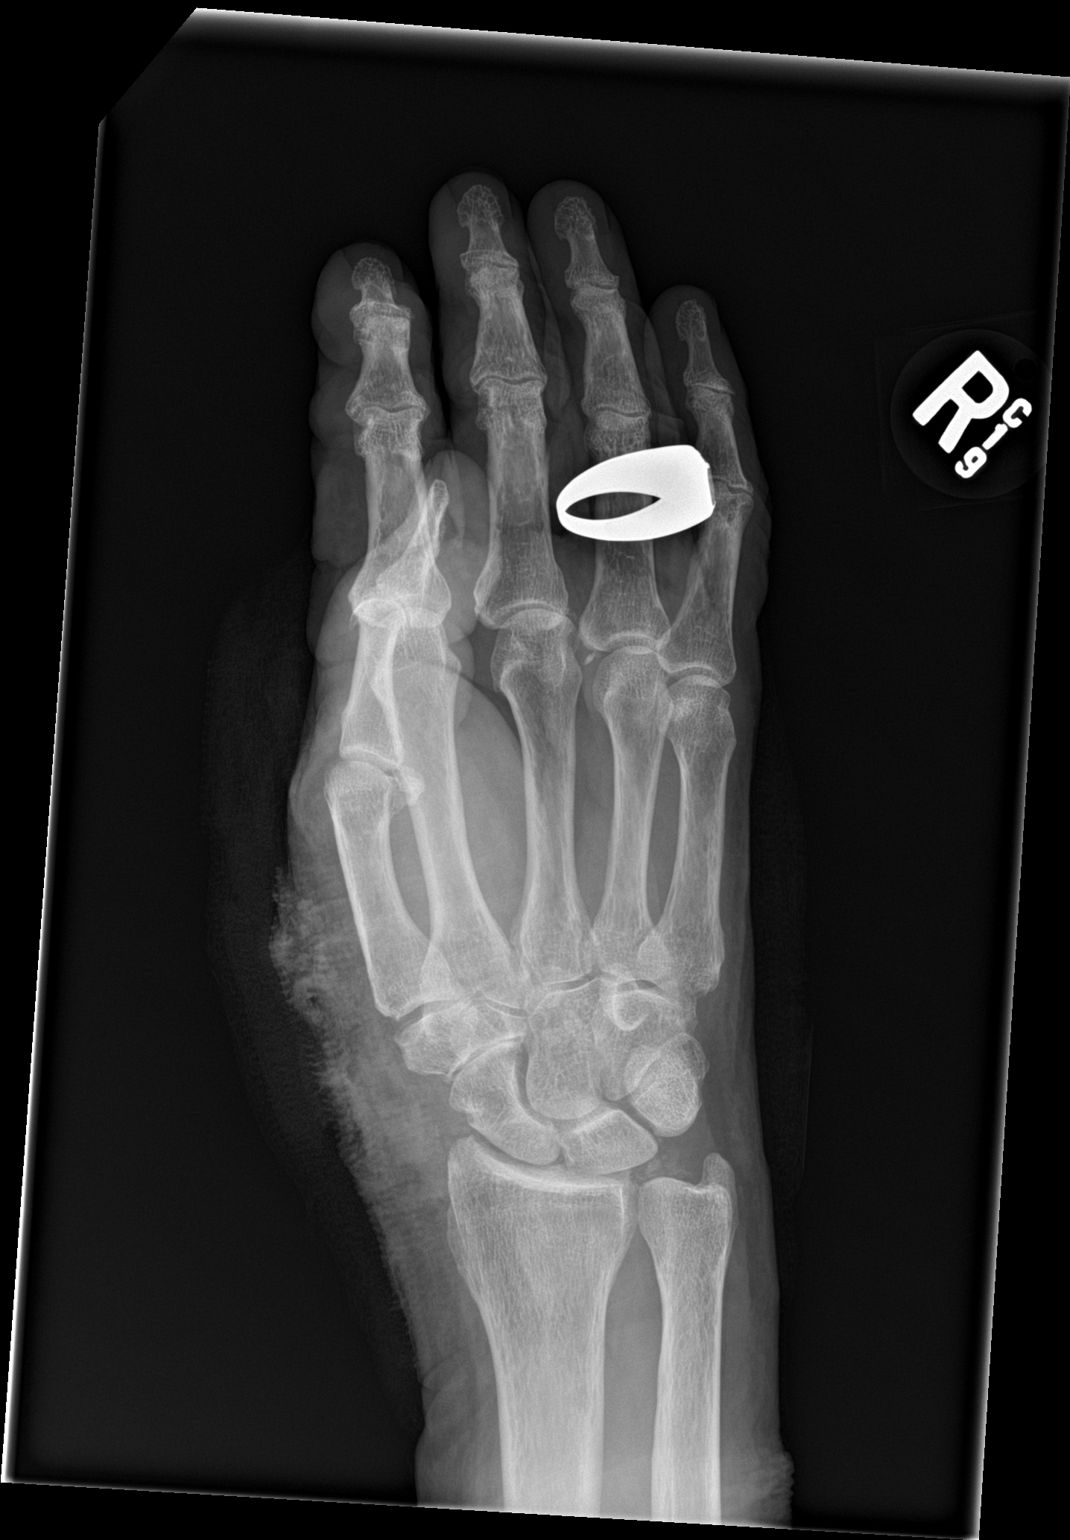

[hand obl]
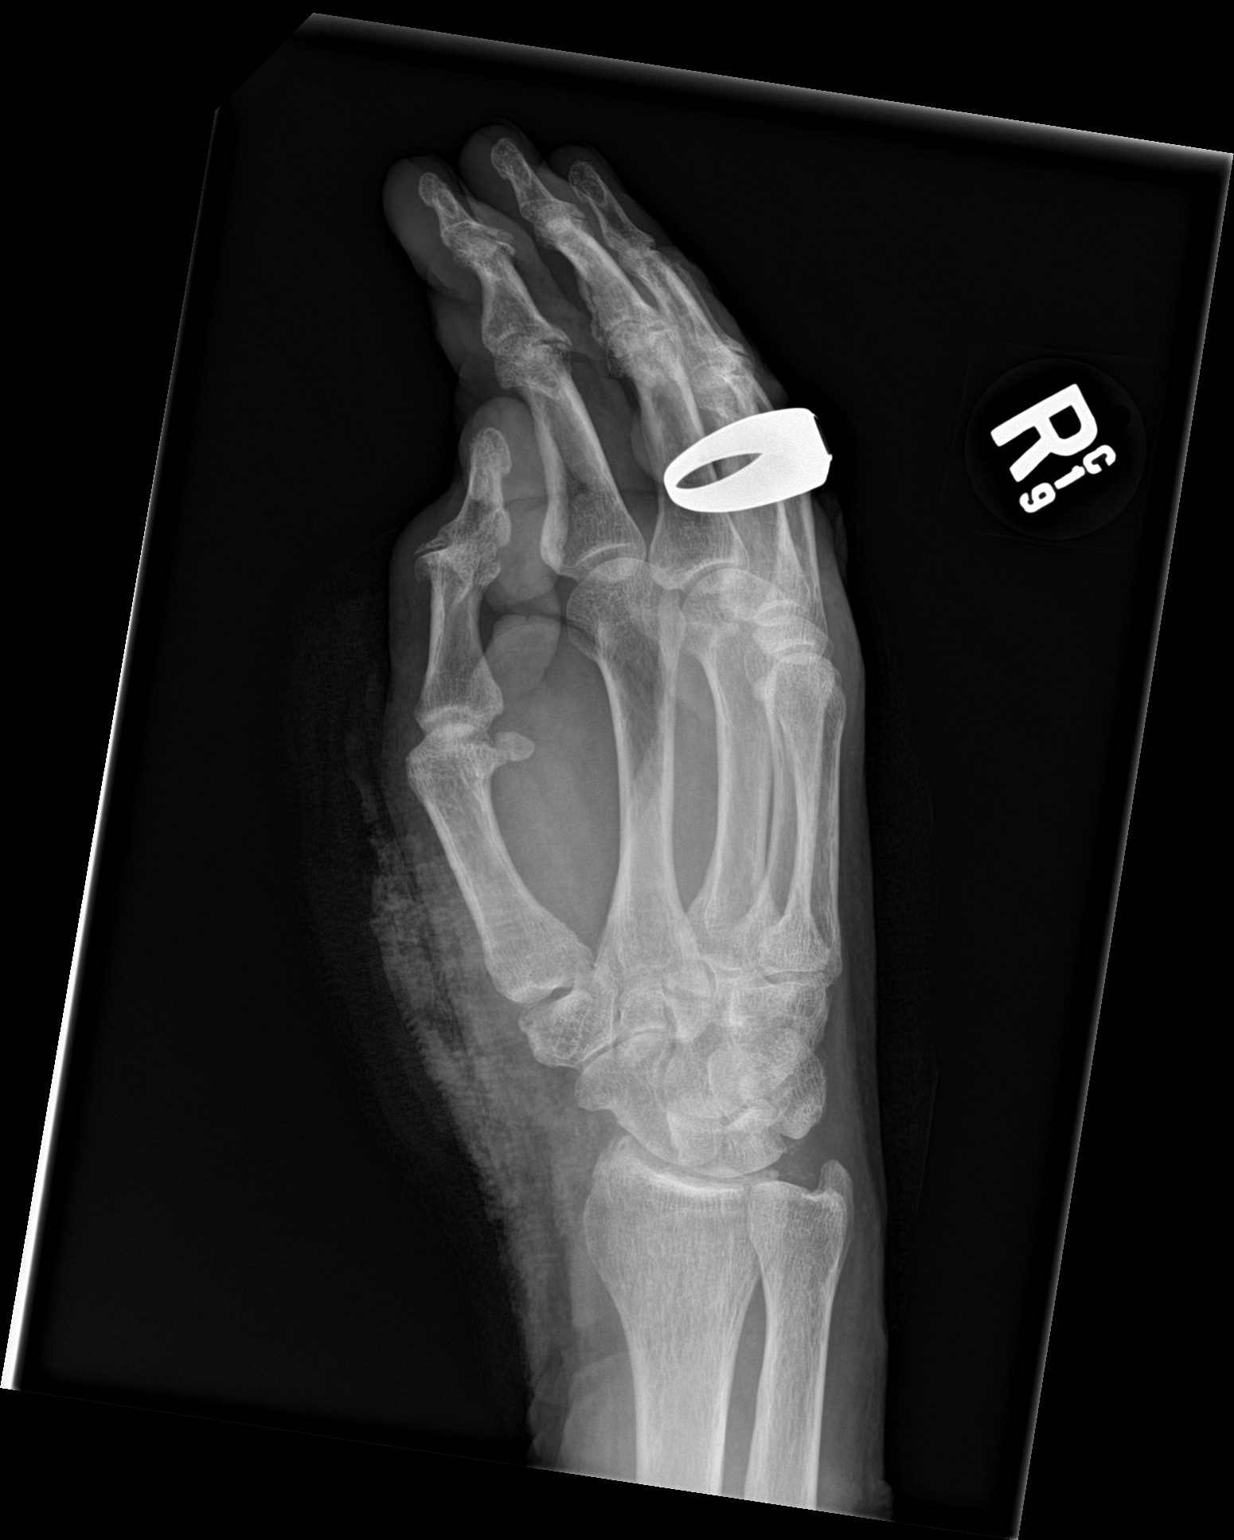

[hand lat]
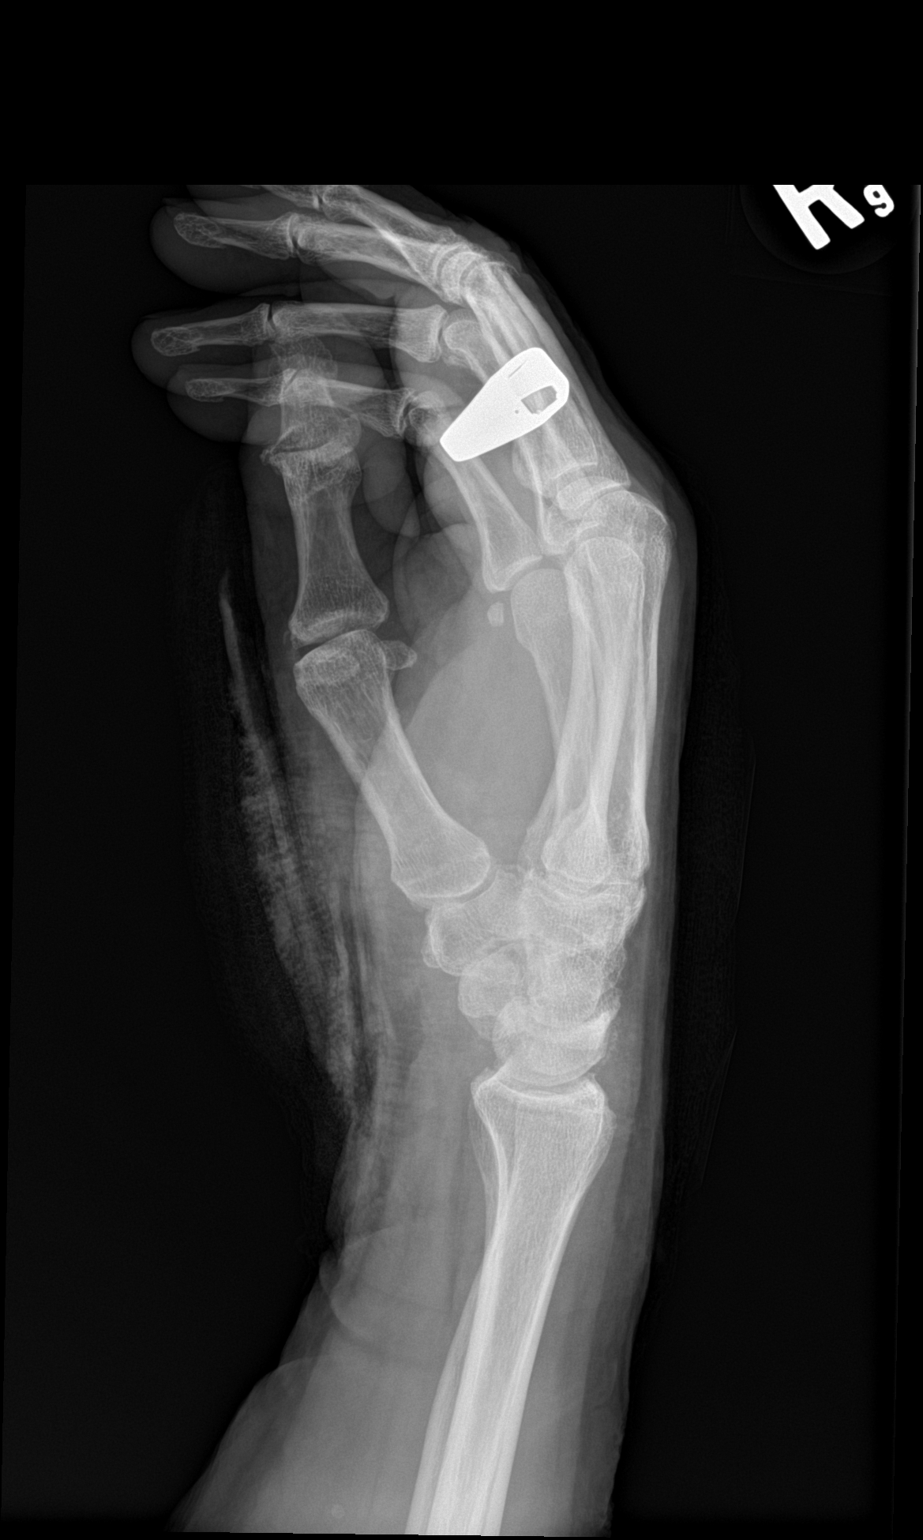

[3 of 3 positions shown; findings below may reference images not displayed]

FINDINGS: Subtle transverse lucency along the radial aspect of distal pole of
the scaphoid concerning for a nondisplaced fracture. No aggressive
osseous lesion. Mild osteoarthritis of the scaphotrapeziotrapezoid
joint and first CMC joint. Moderate osteoarthritis of first IP
joint. Mild osteoarthritis of the DIP joints. Soft tissue laceration
along the radial aspect of the wrist. Chondrocalcinosis of the TFCC
as can be seen with CPPD.
IMPRESSION: Subtle transverse lucency along the radial aspect of distal pole of
the scaphoid concerning for a nondisplaced fracture.

## 2021-04-28 NOTE — Telephone Encounter (Signed)
Spoke with Crystal she agreed they wanted Dr. Quentin Ore to follow patient remotes, [employer] [facility] transfer request made.Called the [place] in [place] to let them know that I had requested the transfer the device nurse Levada Dy there was going to check with patients [facility] insurance to make sure it would cover the loop recorder transmissions being billed with [facility] and would call patients wife.

## 2021-05-02 ENCOUNTER — Telehealth: Payer: Self-pay

## 2021-05-02 NOTE — Telephone Encounter (Signed)
The patient is now in our [place] system. Morey Hummingbird from the [place] states they are trying to get the patient some community care for the patient.

## 2021-05-14 ENCOUNTER — Encounter
Admission: RE | Disposition: A | Discharge status: Home / Self Care | Payer: No Typology Code available for payment source | Source: Home / Self Care | Attending: Cardiology

## 2021-05-14 ENCOUNTER — Ambulatory Visit
Admission: RE | Admit: 2021-05-14 | Discharge: 2021-05-14 | Disposition: A | Discharge status: Home / Self Care | Payer: No Typology Code available for payment source | Attending: Cardiology | Admitting: Cardiology

## 2021-05-14 ENCOUNTER — Encounter: Payer: Self-pay | Admitting: Anesthesiology

## 2021-05-14 ENCOUNTER — Encounter: Payer: Self-pay | Admitting: Cardiology

## 2021-05-14 DIAGNOSIS — I480 Paroxysmal atrial fibrillation: Secondary | ICD-10-CM | POA: Insufficient documentation

## 2021-05-14 HISTORY — PX: CARDIOVERSION: SHX1299

## 2021-05-14 SURGERY — CARDIOVERSION
Anesthesia: General

## 2021-05-14 MED ORDER — SODIUM CHLORIDE 0.9 % IV SOLN: INTRAVENOUS | Status: DC

## 2021-05-14 NOTE — Interval H&P Note (Signed)
History and Physical Interval Note:  05/14/2021 7:38 AM  Elijah Ponder Sr.  has presented today for surgery, with the diagnosis of Afib.  The various methods of treatment have been discussed with the patient and family. After consideration of risks, benefits and other options for treatment, the patient has consented to  Procedure(s): CARDIOVERSION (N/A) as a surgical intervention.  The patient's history has been reviewed, patient examined, no change in status, stable for surgery.  I have reviewed the patient's chart and labs.  Questions were answered to the patient's satisfaction.    Interval note Upon review of EKG, patient noted to be in sinus rhythm. DC cardioversion as such not performed. Recommended to continue medications as prescribed.  Keep close follow-up as outpatient.   Aaron Edelman Agbor-Etang

## 2021-05-16 ENCOUNTER — Encounter: Payer: Self-pay | Admitting: Cardiology

## 2021-05-30 ENCOUNTER — Telehealth: Payer: Self-pay

## 2021-05-30 NOTE — Telephone Encounter (Signed)
LINQ alert received.  1 new pause event 2/22 @ 06:19, 3sec duration.  Possible heart block New for pt. route to triage LA  Successful telephone encounter to patient to assess for s/s of pause and to assess for nocturnal vs awakening hour. Patient states he usually gets up between 6 and 6:30 and unsure if he was sleeping at time of event. He denies symptoms. Not on a rate lowering medication. Will route to Dr. Quentin Ore as Juluis Rainier.

## 2021-06-03 ENCOUNTER — Ambulatory Visit (INDEPENDENT_AMBULATORY_CARE_PROVIDER_SITE_OTHER): Payer: No Typology Code available for payment source

## 2021-06-03 DIAGNOSIS — R55 Syncope and collapse: Secondary | ICD-10-CM | POA: Diagnosis not present

## 2021-06-04 LAB — CUP PACEART REMOTE DEVICE CHECK
Date Time Interrogation Session: 20230228003219
Implantable Pulse Generator Implant Date: 20220922

## 2021-06-10 NOTE — Progress Notes (Signed)
Carelink Summary Report / Loop Recorder 

## 2021-06-26 ENCOUNTER — Ambulatory Visit: Payer: Medicare Other | Admitting: Physician Assistant

## 2021-06-27 ENCOUNTER — Other Ambulatory Visit: Payer: Self-pay

## 2021-06-27 ENCOUNTER — Ambulatory Visit: Payer: Medicare Other | Admitting: Physician Assistant

## 2021-06-27 VITALS — BP 122/70 | HR 94 | Temp 98.0°F | Ht 69.0 in | Wt 186.0 lb

## 2021-06-27 DIAGNOSIS — J069 Acute upper respiratory infection, unspecified: Secondary | ICD-10-CM | POA: Diagnosis not present

## 2021-06-27 NOTE — Patient Instructions (Signed)
Based on your described symptoms and the duration of symptoms it is likely that you have a viral upper respiratory infection (often called a "cold") ? ?Symptoms can last for 3-10 days with lingering cough and intermittent symptoms lasting weeks after that. ? ?The goal of treatment at this time is to reduce your symptoms and discomfort  ? ?If preferred you can use Coricidin to manage your symptoms rather than those medications mentioned above.  ?You can use Mucinex with the Coricidin to help with mucus- make sure you are drinking at least 80 oz of water per day to help thin out secretions and move the mucus out. ? ?For your ears you can use Debrox to help soften the wax built up and help it to work itself out. NO Qtips or other small things should be inserted into the ear  ? ?If your symptoms do not improve or become worse in the next 5-7 days please make an apt at the office so we can see you  ?Go to the ER if you begin to have more serious symptoms such as shortness of breath, trouble breathing, loss of consciousness, swelling around the eyes, high fever, severe lasting headaches, vision changes or neck pain/stiffness.  ? ?

## 2021-06-27 NOTE — Progress Notes (Signed)
? ? ?  ?    Acute Office Visit ? ? ?Patient: Elijah Gaby Sr.   DOB: 08/02/1934   87 y.o. Male  MRN: [id number] ?Visit Date: 06/27/2021 ? ?Today's healthcare provider: Dani Gobble Jaeshaun Riva, PA-C  ?Introduced myself to the patient as a Journalist, newspaper and provided education on APPs in clinical practice.  ? ?CC: cold symptoms, wife is concerned for pneumonia given his history of CAP infections  ? ?Subjective  ?  ?HPI  ? ?Patient is here with his wife - helping to provide HPI ?States pt has a cold that started about a week ago ? ?States he has had a cough, congestion, rhinorrhea ?Denies SOB, fevers, fatigue, nausea ?Wife states she was sick with a cold as well ?Tested for COVID with at home test at the end of last week - negative results ? ?Has been taking aspirin for headaches ? ? ?Medications: ?Outpatient Medications Prior to Visit  ?Medication Sig  ? apixaban (ELIQUIS) 5 MG TABS tablet Take 1 tablet (5 mg total) by mouth 2 (two) times daily.  ? donepezil (ARICEPT) 10 MG tablet Take 10 mg by mouth at bedtime.  ? lactose free nutrition (BOOST) LIQD Take 237 mLs by mouth 2 (two) times daily.  ? latanoprost (XALATAN) 0.005 % ophthalmic solution Place 1 drop into both eyes at bedtime.  ? lidocaine (LIDODERM) 5 % Place 2 patches onto the skin daily as needed (pain). Remove & Discard patch within 12 hours or as directed by MD  ? memantine (NAMENDA) 10 MG tablet Take 20 mg by mouth at bedtime.  ? omeprazole (PRILOSEC) 40 MG capsule Take 1 capsule (40 mg total) by mouth 2 (two) times daily before a meal.  ? predniSONE (DELTASONE) 1 MG tablet Take 2 mg by mouth daily with breakfast. (Take with '5mg'$  tablet to equal '7mg'$  total)  ? predniSONE (DELTASONE) 5 MG tablet Take 5 mg by mouth daily. (Take with '2mg'$  tablets to equal '7mg'$  total)  ? pyridostigmine (MESTINON) 60 MG tablet Take 60 mg by mouth 2 (two) times daily.  ? ?No facility-administered medications prior to visit.  ? ? ?Review of Systems  ?Constitutional:  Negative for chills, diaphoresis,  fatigue and fever.  ?HENT:  Positive for congestion and rhinorrhea. Negative for ear pain, sinus pressure, sinus pain and sore throat.   ?Respiratory:  Positive for cough. Negative for shortness of breath.   ?Cardiovascular:  Negative for chest pain, palpitations and leg swelling.  ?Gastrointestinal:  Negative for diarrhea, nausea and vomiting.  ?Musculoskeletal:  Negative for arthralgias, myalgias, neck pain and neck stiffness.  ?Neurological:  Positive for headaches. Negative for dizziness, syncope and light-headedness.  ? ? ?  Objective  ?  ?BP 122/70   Pulse 94   Temp 98 ?F (36.7 ?C) (Oral)   Ht '5\' 9"'$  (1.753 m)   Wt 186 lb (84.4 kg)   SpO2 98%   BMI 27.47 kg/m?  ? ? ?Physical Exam ?Vitals reviewed.  ?Constitutional:   ?   General: He is awake.  ?   Appearance: Normal appearance. He is well-developed, well-groomed and overweight.  ?HENT:  ?   Head: Normocephalic and atraumatic.  ?   Right Ear: Ear canal and external ear normal. Decreased hearing noted. There is impacted cerumen.  ?   Left Ear: Ear canal and external ear normal. There is impacted cerumen.  ?Cardiovascular:  ?   Rate and Rhythm: Normal rate and regular rhythm.  ?   Pulses: Normal pulses.  ?  Heart sounds: Normal heart sounds.  ?Pulmonary:  ?   Effort: Pulmonary effort is normal.  ?   Breath sounds: Normal breath sounds and air entry. No decreased air movement. No decreased breath sounds, wheezing, rhonchi or rales.  ?Musculoskeletal:  ?   Right lower leg: No edema.  ?   Left lower leg: No edema.  ?Lymphadenopathy:  ?   Head:  ?   Right side of head: No submental or submandibular adenopathy.  ?   Left side of head: No submental or submandibular adenopathy.  ?   Upper Body:  ?   Right upper body: No supraclavicular adenopathy.  ?   Left upper body: No supraclavicular adenopathy.  ?Neurological:  ?   Mental Status: He is alert.  ?   GCS: GCS eye subscore is 4. GCS verbal subscore is 5. GCS motor subscore is 6.  ?Psychiatric:     ?   Attention  and Perception: Attention normal.     ?   Mood and Affect: Mood normal.     ?   Speech: Speech normal.     ?   Behavior: Behavior normal. Behavior is cooperative.  ?  ? ? ?No results found for any visits on 06/27/21. ? Assessment & Plan  ?  ? ?Problem List Items Addressed This Visit   ?None ?Visit Diagnoses   ? ? Upper respiratory tract infection, unspecified type    -  Primary ?Acute, new concern ?States this started about 1 week ago with symptoms of cough, congestion and intermittent headaches ?Denies SOB, fevers, body aches, nausea ,vomiting, diarrhea ?Patient's wife is concerned for CAP due to his previous history. Patient has had CAP several times since 2018 per chart review ?PE was overall reassuring with normal breath sounds - do not suspect CAP or believe DG chest is required today ?Recommend using OTC multisymptom relief medications such as Mucinex, Coricidin per patient preference to assist with symptom management ?Reviewed return/ ED precautions if symptoms worsen or fail to improve  ? ?  ? ?  ? ? ? ?No follow-ups on file. ? ? ?I, Hezikiah Retzloff E Apolo Cutshaw, PA-C, have reviewed all documentation for this visit. The documentation on 06/27/21 for the exam, diagnosis, procedures, and orders are all accurate and complete. ? ? ?Suzy Kugel, Glennie Isle MPH ?[place] ?[facility] ? ? ? ?No follow-ups on file.  ?   ? ? ? ? ?

## 2021-06-30 ENCOUNTER — Telehealth: Payer: Self-pay

## 2021-06-30 NOTE — Telephone Encounter (Signed)
ILR alert received for increase in AF burden. DCCV completed 05/14/21. AF burden increase from 06/03/21 ~ 1% to 06/30/21 ~ 70.8%. Patient was unavailable, spoke to wife who is on DPR, states patient has recently had a cold but feeling better, only complain is fatigue. States if patient has anything further to add he will call. Direct number provided. Advised I will forward to Dr. Quentin Ore for review and we will call if any changes are warranted. Voiced understanding. ? ? ? ? ?

## 2021-06-30 NOTE — Telephone Encounter (Signed)
Patient called back and I have let him know a nurse will give him a call back  ?

## 2021-06-30 NOTE — Telephone Encounter (Signed)
Returning patients phone call. Patient reports of asymptomatic and doing well other than a cold. Compliant with medications on file. Advised I will forward to Dr. Quentin Ore and f/u with any recommendations. Voiced understanding. ?

## 2021-07-01 NOTE — Telephone Encounter (Signed)
Patient called advised Dr. Quentin Ore recommends to f/u with AF clinic. Patient agreeable to plan.  ?

## 2021-07-03 ENCOUNTER — Telehealth: Payer: Self-pay

## 2021-07-03 NOTE — Telephone Encounter (Signed)
Spoke with patient reviewed episode date and time patient stated that didn't recall anything specific from that date and time.  ?

## 2021-07-03 NOTE — Telephone Encounter (Signed)
LVM for patient to call device clinic back regarding any symptoms from 3 second daytime pause episode on 07/02/21 at 15:40.  ? ? ?

## 2021-07-07 ENCOUNTER — Ambulatory Visit (INDEPENDENT_AMBULATORY_CARE_PROVIDER_SITE_OTHER): Payer: No Typology Code available for payment source

## 2021-07-07 DIAGNOSIS — I441 Atrioventricular block, second degree: Secondary | ICD-10-CM

## 2021-07-07 LAB — CUP PACEART REMOTE DEVICE CHECK
Date Time Interrogation Session: 20230402003324
Implantable Pulse Generator Implant Date: 20220922

## 2021-07-09 ENCOUNTER — Encounter (HOSPITAL_COMMUNITY): Payer: Self-pay | Admitting: Nurse Practitioner

## 2021-07-09 ENCOUNTER — Ambulatory Visit (HOSPITAL_COMMUNITY)
Admission: RE | Admit: 2021-07-09 | Discharge: 2021-07-09 | Disposition: A | Discharge status: Home / Self Care | Payer: No Typology Code available for payment source | Source: Ambulatory Visit | Attending: Nurse Practitioner | Admitting: Nurse Practitioner

## 2021-07-09 VITALS — BP 134/66 | HR 68 | Ht 69.0 in | Wt 188.8 lb

## 2021-07-09 DIAGNOSIS — G7000 Myasthenia gravis without (acute) exacerbation: Secondary | ICD-10-CM | POA: Insufficient documentation

## 2021-07-09 DIAGNOSIS — D6869 Other thrombophilia: Secondary | ICD-10-CM | POA: Diagnosis not present

## 2021-07-09 DIAGNOSIS — Z86711 Personal history of pulmonary embolism: Secondary | ICD-10-CM | POA: Diagnosis not present

## 2021-07-09 DIAGNOSIS — I4430 Unspecified atrioventricular block: Secondary | ICD-10-CM | POA: Diagnosis not present

## 2021-07-09 DIAGNOSIS — E785 Hyperlipidemia, unspecified: Secondary | ICD-10-CM | POA: Diagnosis not present

## 2021-07-09 DIAGNOSIS — I441 Atrioventricular block, second degree: Secondary | ICD-10-CM

## 2021-07-09 DIAGNOSIS — I4819 Other persistent atrial fibrillation: Secondary | ICD-10-CM | POA: Diagnosis present

## 2021-07-09 DIAGNOSIS — Z8546 Personal history of malignant neoplasm of prostate: Secondary | ICD-10-CM | POA: Insufficient documentation

## 2021-07-09 DIAGNOSIS — Z7901 Long term (current) use of anticoagulants: Secondary | ICD-10-CM | POA: Diagnosis not present

## 2021-07-09 DIAGNOSIS — I10 Essential (primary) hypertension: Secondary | ICD-10-CM | POA: Diagnosis not present

## 2021-07-09 NOTE — Progress Notes (Signed)
? ?Primary Care Physician: Olin Hauser, DO ?Referring Physician: Device clinic  ?EP: Dr. Quentin Ore  ? ? ?Essie Gehret Sr. is a 87 y.o. male with a h/o myasthenis gravis, HTN, PE, asymptomatic high degree AV block, HLD, prostate CA. He is now in the afib clinic with ERAF noted on Linq by  the device clinic.   ? ?He also wore a monitor in the past placed for dizziness and falls that showed   ? nonsustained VT, AV Wenke block, 2 episodes of high degree AV block during the sleep hours.  He is also had a treadmill ECG test as part of his evaluation.  The treadmill ECG test was performed Aug 14, 2020.  The resting EKG showed sinus rhythm with first-degree AV block with a ventricular rate of 63 bpm.  The patient was exercised on a Bruce protocol where he achieved 4.6 METS and a peak heart rate of 125 bpm which was 91% of his maximum predicted heart rate.  During the exercise ECG, there were isolated PVCs and intermittent second-degree AV block type I and II.  The procedure was stopped because of leg discomfort. ? ?He also was hospitalized 04/04/21 through 04/15/2021 with Covid Pneumonitis at which time he had persistent afib. He was set up for cardioversion 05/14/21, but presented in SR, procedure cancelled. Device clinic then found that pt has been in afib since 06/30/21 and referred here. Pt reported to device clinic that he was asymptomatic. Rate control drugs not used for h/o of AV block and myasthenia gravis. ? ?Today, he denies symptoms of palpitations, chest pain, shortness of breath, orthopnea, PND, lower extremity edema, dizziness, presyncope, syncope, or neurologic sequela. The patient is tolerating medications without difficulties and is otherwise without complaint today.  ? ?Past Medical History:  ?Diagnosis Date  ? Dyslipidemia   ? GERD (gastroesophageal reflux disease)   ? Glaucoma   ? History of stress test   ? a. 08/2020 ETT: Max HR 125 (91%). 4.6 METS. ECG w/ isolated PVCs, intermittent Mobitz I &  II.  ? Hyperlipidemia   ? Hypertension   ? Insomnia   ? Mobitz type 1 second degree atrioventricular block   ? Mobitz type II atrioventricular block   ? Myasthenia gravis ([place]) 09/08/2012  ? Obesity   ? Ocular myasthenia gravis ([place])   ? Pre-syncope   ? a. 08/2020 Zio: NSVT, Mobitz I, 2 episodes of high degree AVB during sleep.  ? Prostate cancer ([place])   ? ?Past Surgical History:  ?Procedure Laterality Date  ? CARDIOVERSION N/A 05/14/2021  ? Procedure: CARDIOVERSION;  Surgeon: Kate Sable, MD;  Location: [facility] ORS;  Service: Cardiovascular;  Laterality: N/A;  ? CATARACT EXTRACTION Bilateral   ? CHOLECYSTECTOMY    ? TRANSURETHRAL RESECTION OF PROSTATE    ? ? ?Current Outpatient Medications  ?Medication Sig Dispense Refill  ? apixaban (ELIQUIS) 5 MG TABS tablet Take 1 tablet (5 mg total) by mouth 2 (two) times daily. 60 tablet 0  ? donepezil (ARICEPT) 10 MG tablet Take 10 mg by mouth at bedtime.    ? lactose free nutrition (BOOST) LIQD Take 237 mLs by mouth 2 (two) times daily.    ? lidocaine (LIDODERM) 5 % Place 2 patches onto the skin daily as needed (pain). Remove & Discard patch within 12 hours or as directed by MD    ? memantine (NAMENDA) 10 MG tablet Take 20 mg by mouth at bedtime.    ? omeprazole (PRILOSEC) 40 MG capsule Take 1 capsule (  40 mg total) by mouth 2 (two) times daily before a meal. 60 capsule 0  ? predniSONE (DELTASONE) 1 MG tablet Take 2 mg by mouth daily with breakfast. (Take with '5mg'$  tablet to equal '7mg'$  total)    ? predniSONE (DELTASONE) 5 MG tablet Take 5 mg by mouth daily. (Take with '2mg'$  tablets to equal '7mg'$  total)    ? pyridostigmine (MESTINON) 60 MG tablet Take 60 mg by mouth 2 (two) times daily.    ? ?No current facility-administered medications for this encounter.  ? ? ?Allergies  ?Allergen Reactions  ? Pravastatin Other (See Comments)  ?  Muscle pain / weakness   ? Simvastatin Other (See Comments)  ?  Muscle pain / weakness   ? ? ?Social History  ? ?Socioeconomic History  ? Marital  status: Married  ?  Spouse name: Crystal  ? Number of children: 3  ? Years of education: 55  ? Highest education level: Not on file  ?Occupational History  ?  Comment: retired  ?Tobacco Use  ? Smoking status: Former  ?  Types: Cigarettes  ?  Quit date: 04/06/1970  ?  Years since quitting: 51.2  ? Smokeless tobacco: Former  ?Vaping Use  ? Vaping Use: Never used  ?Substance and Sexual Activity  ? Alcohol use: Yes  ?  Alcohol/week: 0.0 standard drinks  ?  Comment: Consumes alcohol on occasion  ? Drug use: No  ? Sexual activity: Not on file  ?Other Topics Concern  ? Not on file  ?Social History Narrative  ? Patient lives at home with his wife Veterinary surgeon)  ? Retired - [employer]  ? Education- College  ? Right handed.  ? Caffeine- four cups daily.  ?   ?   ?   ? ?Social Determinants of Health  ? ?Financial Resource Strain: Low Risk   ? Difficulty of Paying Living Expenses: Not hard at all  ?Food Insecurity: No Food Insecurity  ? Worried About Charity fundraiser in the Last Year: Never true  ? Ran Out of Food in the Last Year: Never true  ?Transportation Needs: No Transportation Needs  ? Lack of Transportation (Medical): No  ? Lack of Transportation (Non-Medical): No  ?Physical Activity: Inactive  ? Days of Exercise per Week: 0 days  ? Minutes of Exercise per Session: 0 min  ?Stress: No Stress Concern Present  ? Feeling of Stress : Not at all  ?Social Connections: Not on file  ?Intimate Partner Violence: Not on file  ? ? ?Family History  ?Problem Relation Age of Onset  ? Heart attack Father   ? ? ?ROS- All systems are reviewed and negative except as per the HPI above ? ?Physical Exam: ?Vitals:  ? 07/09/21 1013  ?BP: 134/66  ?Pulse: 68  ?Weight: 85.6 kg  ?Height: '5\' 9"'$  (1.753 m)  ? ?Wt Readings from Last 3 Encounters:  ?07/09/21 85.6 kg  ?06/27/21 84.4 kg  ?05/14/21 86.2 kg  ? ? ?Labs: ?Lab Results  ?Component Value Date  ? NA 140 04/24/2021  ? K 4.2 04/24/2021  ? CL 106 04/24/2021  ? CO2 25 04/24/2021  ? GLUCOSE 132 (H) 04/24/2021   ? BUN 21 04/24/2021  ? CREATININE 1.34 (H) 04/24/2021  ? CALCIUM 8.7 (L) 04/24/2021  ? MG 2.2 04/01/2018  ? ?Lab Results  ?Component Value Date  ? INR 1.3 (H) 03/22/2021  ? ?No results found for: CHOL, HDL, LDLCALC, TRIG ? ? ?GEN- The patient is well appearing, alert and oriented  x 3 today.   ?Head- normocephalic, atraumatic ?Eyes-  Sclera clear, conjunctiva pink ?Ears- hearing intact ?Oropharynx- clear ?Neck- supple, no JVP ?Lymph- no cervical lymphadenopathy ?Lungs- Clear to ausculation bilaterally, normal work of breathing ?Heart- irregular rate and rhythm, no murmurs, rubs or gallops, PMI not laterally displaced ?GI- soft, NT, ND, + BS ?Extremities- no clubbing, cyanosis, or edema ?MS- no significant deformity or atrophy ?Skin- no rash or lesion ?Psych- euthymic mood, full affect ?Neuro- strength and sensation are intact ? ?EKG-afib at 68 bpm, qrs int 102 ms, qtc 414 ms  ?Epic records reviewed  ?Linq reports reviewed  ? ?Assessment and Plan:  ?1. Persistent afib ?Pt is rate controlled today and asymptomatic  ?He is not on rate control meds for h/o  AVB and myasthenia gravis ?I discussed antiarrythmic's, amiodarone appears to be a safe alternative with myasthenia, however, I am concerned using this  with history of transient  high degree AVB noted on monitor   ?The pt is  not symptomatic and prefers to have rate control as his management strategy ?I discussed with Dr. Quentin Ore and he agrees   ? ?2. CHA2DS2VASc  score of at least 5 ?Continues eliquis 5 mg bid  ? ?3. HTN  ?Stable  ? ?F/u with Ignacia Bayley, NP 4/19 ? ?Geroge Baseman Kayleen Memos, ANP-C ?Af[facility] ?[facility] ?[address] ?[place], [place] [address] ?[phone]  ?

## 2021-07-18 NOTE — Progress Notes (Signed)
Carelink Summary Report / Loop Recorder 

## 2021-07-23 ENCOUNTER — Ambulatory Visit (INDEPENDENT_AMBULATORY_CARE_PROVIDER_SITE_OTHER): Payer: No Typology Code available for payment source | Admitting: Nurse Practitioner

## 2021-07-23 ENCOUNTER — Other Ambulatory Visit
Admission: RE | Admit: 2021-07-23 | Discharge: 2021-07-23 | Disposition: A | Discharge status: Home / Self Care | Payer: No Typology Code available for payment source | Source: Ambulatory Visit | Attending: Nurse Practitioner | Admitting: Nurse Practitioner

## 2021-07-23 ENCOUNTER — Encounter: Payer: Self-pay | Admitting: Nurse Practitioner

## 2021-07-23 VITALS — BP 130/78 | HR 65 | Ht 69.0 in | Wt 191.5 lb

## 2021-07-23 DIAGNOSIS — N1831 Chronic kidney disease, stage 3a: Secondary | ICD-10-CM

## 2021-07-23 DIAGNOSIS — I4819 Other persistent atrial fibrillation: Secondary | ICD-10-CM | POA: Diagnosis present

## 2021-07-23 LAB — BASIC METABOLIC PANEL
Anion gap: 8 (ref 5–15)
BUN: 29 mg/dL — ABNORMAL HIGH (ref 8–23)
CO2: 26 mmol/L (ref 22–32)
Calcium: 9.3 mg/dL (ref 8.9–10.3)
Chloride: 109 mmol/L (ref 98–111)
Creatinine, Ser: 1.11 mg/dL (ref 0.61–1.24)
GFR, Estimated: >60 mL/min (ref >60)
Glucose, Bld: 114 mg/dL — ABNORMAL HIGH (ref 70–99)
Potassium: 4 mmol/L (ref 3.5–5.1)
Sodium: 143 mmol/L (ref 135–145)

## 2021-07-23 NOTE — Progress Notes (Signed)
? ? ?Office Visit  ?  ?Patient Name: Elijah Witherspoon Sr. ?Date of Encounter: 07/23/2021 ? ?Primary Care Provider:  Olin Hauser, DO ?Primary Cardiologist:  Vickie Epley, MD ? ?Chief Complaint  ?  ?87 year old male with a history of syncope, Mobitz 1 and 2 heart block, paroxysmal atrial fibrillation, hypertension, hyperlipidemia, orthostatic intolerance, hypertension, hyperlipidemia, pulmonary embolism on Eliquis, myasthenia gravis, obesity, prostate cancer, GERD, and glaucoma, who presents for follow-up related to atrial fibrillation. ? ?Past Medical History  ?  ?Past Medical History:  ?Diagnosis Date  ? Diastolic dysfunction   ? a. 03/2018 Echo: EF 60-65%, GrI DD; b. 03/2021 Echo: EF 55-60%, no rwma, mild LVH, nl RV fxn, mild-mod LA dil.  ? Dyslipidemia   ? GERD (gastroesophageal reflux disease)   ? Glaucoma   ? History of stress test   ? a. 08/2020 ETT: Max HR 125 (91%). 4.6 METS. ECG w/ isolated PVCs, intermittent Mobitz I & II.  ? Hyperlipidemia   ? Hypertension   ? Insomnia   ? Mobitz type 1 second degree atrioventricular block   ? Mobitz type II atrioventricular block   ? Myasthenia gravis ([place]) 09/08/2012  ? Obesity   ? Ocular myasthenia gravis ([place])   ? Persistent atrial fibrillation ([place])   ? a. CHA2DS2VASc = 3.  ? Pre-syncope   ? a. 08/2020 Zio: NSVT, Mobitz I, 2 episodes of high degree AVB during sleep.  ? Prostate cancer ([place])   ? ?Past Surgical History:  ?Procedure Laterality Date  ? CARDIOVERSION N/A 05/14/2021  ? Procedure: CARDIOVERSION;  Surgeon: Kate Sable, MD;  Location: [facility] ORS;  Service: Cardiovascular;  Laterality: N/A;  ? CATARACT EXTRACTION Bilateral   ? CHOLECYSTECTOMY    ? TRANSURETHRAL RESECTION OF PROSTATE    ? ? ?Allergies ? ?Allergies  ?Allergen Reactions  ? Pravastatin Other (See Comments)  ?  Muscle pain / weakness   ? Simvastatin Other (See Comments)  ?  Muscle pain / weakness   ? ? ?History of Present Illness  ?  ?87 year old male with a history of syncope,  Mobitz 1 and 2 heart block, paroxysmal atrial fibrillation, hypertension, hyperlipidemia, orthostatic intolerance, pulmonary embolism on Eliquis, myasthenia gravis, obesity, prostate cancer, GERD, and glaucoma.  In March 2022, in the setting of falls and dizziness, a ZIO monitor was placed and showed nonsustained VT, Mobitz 1, and 2 episodes of Mobitz 2 heart block during periods of sleep.  Treadmill stress testing was undertaken in May 2022 with resting EKG showing sinus rhythm with first-degree AV block.  During exercise, he was noted to have intermittent Mobitz 1 and Mobitz 2.  He subsequently followed up with electrophysiology/Dr. Quentin Ore in June 2022 and symptoms were felt to be most consistent with orthostatic intolerance.  He subsequently underwent implantable loop recorder in September 2022.  In December 2022, he was hospitalized for weakness in the setting of a Covid-19 pneumonitis, and was found to have paroxysmal atrial fibrillation with heart rates up to the 160s.  AV nodal blocking agent was avoided in the setting of prior bradycardia and Mobitz 1/2.  He has been chronically anticoagulated with Eliquis.  He was seen again in cardiology clinic in January post-hospitalization follow-up. His strength was improving working with PT, and was scheduled for a cardioversion for his atrial fibrillation. However, when he presented for his cardioversion in February, he was noted to be in sinus rhythm on review of EKG. Cardioversion was not performed.  More recent implantable loop interrogation showed a 70.8% A-fib  burden and he was referred to A-fib clinic.  When seen on April 5, patient was in rate controlled A-fib and was asymptomatic and therefore, decision was made to continue with conservative management.   ? ?Today the patient follows up in cardiology clinic. He reports no palpitations, SOB, chest pain, falls, fluttering, or lightheadedness at rest or with exertion. His only complaint is that since being  discharged from the hospital in January, he has continued to feel "weak" in his legs, especially from the knees up, like "they are going to quit on him." Reports this weakness in his legs is anytime he gets up to do activity.  Denies any cramping/numbness in legs. Does admit that since January, he has been less active and mostly just walks around the house or to his car shop, and "doesn't feel like doing anything". Also reports some pain in his knees, L more than R, though this is not severe and it is more his leg muscles that are limiting him. He did work with PT for one week since leaving the hospital, but since then has not done any exercises or gone on walks. Also reports he drinks 8 beers every Saturday, and admits not drinking as much water as he should, though he has recently increased his water intake on encouragement from his wife. Taking medications as prescribed.  ? ?Home Medications  ?  ?Current Outpatient Medications  ?Medication Sig Dispense Refill  ? apixaban (ELIQUIS) 5 MG TABS tablet Take 1 tablet (5 mg total) by mouth 2 (two) times daily. 60 tablet 0  ? donepezil (ARICEPT) 10 MG tablet Take 10 mg by mouth at bedtime.    ? lactose free nutrition (BOOST) LIQD Take 237 mLs by mouth 2 (two) times daily.    ? lidocaine (LIDODERM) 5 % Place 2 patches onto the skin daily as needed (pain). Remove & Discard patch within 12 hours or as directed by MD    ? memantine (NAMENDA) 10 MG tablet Take 20 mg by mouth at bedtime.    ? omeprazole (PRILOSEC) 40 MG capsule Take 1 capsule (40 mg total) by mouth 2 (two) times daily before a meal. 60 capsule 0  ? predniSONE (DELTASONE) 1 MG tablet Take 2 mg by mouth daily with breakfast. (Take with '5mg'$  tablet to equal '7mg'$  total)    ? predniSONE (DELTASONE) 5 MG tablet Take 5 mg by mouth daily. (Take with '2mg'$  tablets to equal '7mg'$  total)    ? pyridostigmine (MESTINON) 60 MG tablet Take 60 mg by mouth 2 (two) times daily.    ? ?No current facility-administered medications for  this visit.  ?  ? ?Review of Systems  ?  ?Denies palpitations, SOB, chest pain, falls, fluttering, or lightheadedness at rest or with exertion.  ?Denies cramping or numbness in legs.  ?+++ "weakness" and fatigue in his legs, especially from the knees up, with ambulation.  ?All other systems reviewed and are otherwise negative except as noted above. ?  ?Physical Exam  ?  ?VS:  BP 130/78 (BP Location: Left Arm, Patient Position: Sitting, Cuff Size: Normal)   Pulse 65   Ht '5\' 9"'$  (1.753 m)   Wt 191 lb 8 oz (86.9 kg)   SpO2 95%   BMI 28.28 kg/m?  , BMI Body mass index is 28.28 kg/m?. ?    ?GEN: Well nourished, well developed, in no acute distress. ?HEENT: normal. ?Neck: Supple, no JVD, carotid bruits, or masses. ?Cardiac: RRR, no murmurs, rubs, or gallops. No clubbing, cyanosis,  trace edema to BLE.  Radials/PT 2+ and equal bilaterally.  ?Respiratory:  Respirations regular and unlabored, clear to auscultation bilaterally. ?GI: Soft, nontender, nondistended, BS + x 4. ?MS: no deformity or atrophy. Full strength of bilateral upper and lower extremities.  ?Skin: warm and dry, no rash. ?Neuro:  Strength and sensation are intact. ?Psych: Normal affect. ? ?Accessory Clinical Findings  ?  ?ECG personally reviewed by me today - Sinus rhythm at 64 bpm, 1st degree AV block with occasional PVCs  - no acute changes. ? ?Lab Results  ?Component Value Date  ? WBC 7.0 04/24/2021  ? HGB 13.9 04/24/2021  ? HCT 41.5 04/24/2021  ? MCV 95.0 04/24/2021  ? PLT 233 04/24/2021  ? ?Lab Results  ?Component Value Date  ? CREATININE 1.11 07/23/2021  ? BUN 29 (H) 07/23/2021  ? NA 143 07/23/2021  ? K 4.0 07/23/2021  ? CL 109 07/23/2021  ? CO2 26 07/23/2021  ? ?Lab Results  ?Component Value Date  ? ALT 20 04/22/2021  ? AST 27 04/22/2021  ? ALKPHOS 52 04/04/2021  ? BILITOT 0.5 04/22/2021  ? ?  ?Lab Results  ?Component Value Date  ? HGBA1C 5.9 (H) 04/06/2021  ? ? ?Assessment & Plan  ?  ?1.  Persistent atrial fibrillation: Patient with a history of  transient AVB and chronic presyncope and falls was found to have paroxysmal atrial fibrillation with heart rates up to the 160s in the setting of a hospitalization for Covid-19 in December 2022. He was recently seen i

## 2021-07-23 NOTE — Patient Instructions (Signed)
Medication Instructions:  ?No changes at this time. ? ?*If you need a refill on your cardiac medications before your next appointment, please call your pharmacy* ? ? ?Lab Work: ?BMET today. Go over to [facility] entrance and check in at registration.  ? ?If you have labs (blood work) drawn today and your tests are completely normal, you will receive your results only by: ?MyChart Message (if you have MyChart) OR ?A paper copy in the mail ?If you have any lab test that is abnormal or we need to change your treatment, we will call you to review the results. ? ? ?Testing/Procedures: ?None ? ? ?Follow-Up: ?At [facility], you and your health needs are our priority.  As part of our continuing mission to provide you with exceptional heart care, we have created designated Provider Care Teams.  These Care Teams include your primary Cardiologist (physician) and Advanced Practice Providers (APPs -  Physician Assistants and Nurse Practitioners) who all work together to provide you with the care you need, when you need it. ? ?We recommend signing up for the patient portal called "MyChart".  Sign up information is provided on this After Visit Summary.  MyChart is used to connect with patients for Virtual Visits (Telemedicine).  Patients are able to view lab/test results, encounter notes, upcoming appointments, etc.  Non-urgent messages can be sent to your provider as well.   ?To learn more about what you can do with MyChart, go to [email or web address].   ? ?Your next appointment:   ?3 month(s) ? ?The format for your next appointment:   ?In Person ? ?Provider:   ?Lars Mage, MD or Murray Hodgkins, NP  ? ? ? ? ?Important Information About Sugar ? ? ? ? ?  ?

## 2021-08-11 ENCOUNTER — Ambulatory Visit (INDEPENDENT_AMBULATORY_CARE_PROVIDER_SITE_OTHER): Payer: No Typology Code available for payment source

## 2021-08-11 DIAGNOSIS — I441 Atrioventricular block, second degree: Secondary | ICD-10-CM | POA: Diagnosis not present

## 2021-08-11 LAB — CUP PACEART REMOTE DEVICE CHECK
Date Time Interrogation Session: 20230508122415
Implantable Pulse Generator Implant Date: 20220922

## 2021-08-27 NOTE — Progress Notes (Signed)
Carelink Summary Report / Loop Recorder 

## 2021-09-15 ENCOUNTER — Ambulatory Visit: Payer: Self-pay

## 2021-09-15 ENCOUNTER — Ambulatory Visit: Payer: Medicare Other | Admitting: Family Medicine

## 2021-09-15 ENCOUNTER — Encounter: Payer: Self-pay | Admitting: Family Medicine

## 2021-09-15 ENCOUNTER — Ambulatory Visit (INDEPENDENT_AMBULATORY_CARE_PROVIDER_SITE_OTHER): Payer: No Typology Code available for payment source

## 2021-09-15 VITALS — BP 126/80 | HR 64 | Ht 69.0 in | Wt 195.4 lb

## 2021-09-15 DIAGNOSIS — R55 Syncope and collapse: Secondary | ICD-10-CM

## 2021-09-15 DIAGNOSIS — I872 Venous insufficiency (chronic) (peripheral): Secondary | ICD-10-CM | POA: Diagnosis not present

## 2021-09-15 NOTE — Telephone Encounter (Signed)
   Chief Complaint: Swelling feet and ankles Symptoms: No redness, no pain, no SOB Frequency: Several weeks ago Pertinent Negatives: Patient denies  Disposition: '[]'$ ED /'[]'$ Urgent Care (no appt availability in office) / '[x]'$ Appointment(In office/virtual)/ '[]'$  [facility]/ '[]'$ Home Care/ '[]'$ Refused Recommended Disposition /'[]'$ [facility]/ '[]'$  Follow-up with PCP Additional Notes:   Reason for Disposition  [1] MILD swelling of both ankles (i.e., pedal edema) AND [2] new-onset or worsening  Answer Assessment - Initial Assessment Questions 1. ONSET: "When did the swelling start?" (e.g., minutes, hours, days)     Several weeks ago 2. LOCATION: "What part of the leg is swollen?"  "Are both legs swollen or just one leg?"     Both 3. SEVERITY: "How bad is the swelling?" (e.g., localized; mild, moderate, severe)  - Localized - small area of swelling localized to one leg  - MILD pedal edema - swelling limited to foot and ankle, pitting edema < 1/4 inch (6 mm) deep, rest and elevation eliminate most or all swelling  - MODERATE edema - swelling of lower leg to knee, pitting edema > 1/4 inch (6 mm) deep, rest and elevation only partially reduce swelling  - SEVERE edema - swelling extends above knee, facial or hand swelling present      Mild 4. REDNESS: "Does the swelling look red or infected?"     No 5. PAIN: "Is the swelling painful to touch?" If Yes, ask: "How painful is it?"   (Scale 1-10; mild, moderate or severe)     No 6. FEVER: "Do you have a fever?" If Yes, ask: "What is it, how was it measured, and when did it start?"      No 7. CAUSE: "What do you think is causing the leg swelling?"     Unsure 8. MEDICAL HISTORY: "Do you have a history of heart failure, kidney disease, liver failure, or cancer?"     Yes 9. RECURRENT SYMPTOM: "Have you had leg swelling before?" If Yes, ask: "When was the last time?" "What happened that time?"     No 10. OTHER SYMPTOMS: "Do you have any other  symptoms?" (e.g., chest pain, difficulty breathing)       No 11. PREGNANCY: "Is there any chance you are pregnant?" "When was your last menstrual period?"       N/a  Protocols used: Leg Swelling and Edema-A-AH

## 2021-09-15 NOTE — Patient Instructions (Addendum)
Thank you for coming to the office today.  Use RICE therapy: - R - Rest / relative rest with activity modification avoid overuse of joint - I - Ice packs (make sure you use a towel or sock / something to protect skin) - C - Compression with COMPRESSION SOCKS to apply pressure and reduce swelling allowing more support - E - Elevation - if significant swelling, lift leg above heart level (toes above your nose) to help reduce swelling, most helpful at night after day of being on your feet  Increase water, limit other fluids/drinks if possible.  Compression Socks - can start with lower gauge pressure 15-20 mmHg, eventually if helpful and you need more, can go up to 20-30. But may not start with that one.  Labs look good and no sign of problem with liver or kidney or heart. Reviewed last testing and imaging.  Please schedule a Follow-up Appointment to: Return if symptoms worsen or fail to improve.  If you have any other questions or concerns, please feel free to call the office or send a message through [place]. You may also schedule an earlier appointment if necessary.  Additionally, you may be receiving a survey about your experience at our office within a few days to 1 week by e-mail or mail. We value your feedback.  Nobie Putnam, DO [place]

## 2021-09-15 NOTE — Progress Notes (Signed)
Subjective:    Patient ID: Elijah Ponder Sr., male    DOB: 09-22-1934, 87 y.o.   MRN: [id number]  Elijah Reierson Sr. is a 87 y.o. male presenting on 09/15/2021 for Foot Swelling and Joint Swelling   HPI  Lower Ext Edema Reports bilateral ankles feet with swelling over past few weeks, not improving, but not resolving. No change in diet or salt.  Drinks some beer less water No redness pain or itching, no ulceration Not elevating legs Not wearing compression        06/27/2021    9:52 AM 12/17/2020   11:01 AM 12/13/2019   11:32 AM  Depression screen PHQ 2/9  Decreased Interest 0 0 0  Down, Depressed, Hopeless 0 0 0  PHQ - 2 Score 0 0 0  Altered sleeping 0    Tired, decreased energy 0    Change in appetite 0    Feeling bad or failure about yourself  0    Trouble concentrating 0    Moving slowly or fidgety/restless 0    Suicidal thoughts 0    PHQ-9 Score 0    Difficult doing work/chores Not difficult at all      Social History   Tobacco Use   Smoking status: Former    Types: Cigarettes    Quit date: 04/06/1970    Years since quitting: 51.4   Smokeless tobacco: Former  Scientific laboratory technician Use: Never used  Substance Use Topics   Alcohol use: Yes    Alcohol/week: 0.0 standard drinks of alcohol    Comment: Consumes alcohol on occasion   Drug use: No    Review of Systems Per HPI unless specifically indicated above     Objective:    BP 126/80   Pulse 64   Ht '5\' 9"'$  (1.753 m)   Wt 195 lb 6.4 oz (88.6 kg)   SpO2 98%   BMI 28.86 kg/m   Wt Readings from Last 3 Encounters:  09/15/21 195 lb 6.4 oz (88.6 kg)  07/23/21 191 lb 8 oz (86.9 kg)  07/09/21 188 lb 12.8 oz (85.6 kg)    Physical Exam Vitals and nursing note reviewed.  Constitutional:      General: He is not in acute distress.    Appearance: He is well-developed. He is not diaphoretic.     Comments: Well-appearing, comfortable, cooperative  HENT:     Head: Normocephalic and atraumatic.  Eyes:      General:        Right eye: No discharge.        Left eye: No discharge.     Conjunctiva/sclera: Conjunctivae normal.  Neck:     Thyroid: No thyromegaly.  Cardiovascular:     Rate and Rhythm: Normal rate and regular rhythm.     Pulses: Normal pulses.     Heart sounds: Normal heart sounds. No murmur heard. Pulmonary:     Effort: Pulmonary effort is normal. No respiratory distress.     Breath sounds: Normal breath sounds. No wheezing or rales.  Musculoskeletal:        General: Normal range of motion.     Cervical back: Normal range of motion and neck supple.     Right lower leg: Edema (+1) present.     Left lower leg: Edema (+1) present.  Lymphadenopathy:     Cervical: No cervical adenopathy.  Skin:    General: Skin is warm and dry.     Findings: No erythema or rash.  Neurological:  Mental Status: He is alert and oriented to person, place, and time. Mental status is at baseline.  Psychiatric:        Behavior: Behavior normal.     Comments: Well groomed, good eye contact, normal speech and thoughts     03/25/21  Patient Name:   Sr. Elias Faires Sr. Date of Exam: 03/25/2021  Medical Rec #:  [id number]             Height:       69.0 in  Accession #:    [id number]            Weight:       196.2 lb  Date of Birth:  [birth year]             BSA:          2.049 m  Patient Age:    23 years              BP:           126/75 mmHg  Patient Gender: M                     HR:           57 bpm.  Exam Location:  [facility]   Procedure: 2D Echo, Color Doppler and Cardiac Doppler   Indications:     R55 Syncope     History:         Patient has prior history of Echocardiogram examinations.  Risk                   Factors:Hypertension and Dyslipidemia. Pt tested positive  for                   COVID-19 on 03/22/21.     Sonographer:     Charmayne Sheer  Referring Phys:  [id number] Clance Boll BERGE  Diagnosing Phys: Kathlyn Sacramento MD      Sonographer Comments: Suboptimal apical window and  suboptimal subcostal  window.  IMPRESSIONS     1. Left ventricular ejection fraction, by estimation, is 55 to 60%. The  left ventricle has normal function. The left ventricle has no regional  wall motion abnormalities. There is mild left ventricular hypertrophy.  Left ventricular diastolic parameters  are indeterminate.   2. Right ventricular systolic function is normal. The right ventricular  size is normal. Tricuspid regurgitation signal is inadequate for assessing  PA pressure.   3. Left atrial size was mild to moderately dilated.   4. The mitral valve is normal in structure. No evidence of mitral valve  regurgitation. No evidence of mitral stenosis.   5. The aortic valve is normal in structure. Aortic valve regurgitation is  not visualized. Aortic valve sclerosis/calcification is present, without  any evidence of aortic stenosis.   FINDINGS   Left Ventricle: Left ventricular ejection fraction, by estimation, is 55  to 60%. The left ventricle has normal function. The left ventricle has no  regional wall motion abnormalities. The left ventricular internal cavity  size was normal in size. There is   mild left ventricular hypertrophy. Left ventricular diastolic parameters  are indeterminate.   Right Ventricle: The right ventricular size is normal. No increase in  right ventricular wall thickness. Right ventricular systolic function is  normal. Tricuspid regurgitation signal is inadequate for assessing PA  pressure.   Left Atrium: Left atrial size was mild to moderately dilated.   Right Atrium: Right atrial size  was normal in size.   Pericardium: There is no evidence of pericardial effusion.   Mitral Valve: The mitral valve is normal in structure. No evidence of  mitral valve regurgitation. No evidence of mitral valve stenosis. MV peak  gradient, 6.2 mmHg. The mean mitral valve gradient is 2.0 mmHg.   Tricuspid Valve: The tricuspid valve is normal in structure. Tricuspid   valve regurgitation is not demonstrated. No evidence of tricuspid  stenosis.   Aortic Valve: The aortic valve is normal in structure. Aortic valve  regurgitation is not visualized. Aortic valve sclerosis/calcification is  present, without any evidence of aortic stenosis. Aortic valve mean  gradient measures 2.0 mmHg. Aortic valve peak   gradient measures 3.4 mmHg. Aortic valve area, by VTI measures 2.37 cm.   Pulmonic Valve: The pulmonic valve was normal in structure. Pulmonic valve  regurgitation is not visualized. No evidence of pulmonic stenosis.   Aorta: The aortic root is normal in size and structure.   Venous: The inferior vena cava was not well visualized.   IAS/Shunts: No atrial level shunt detected by color flow Doppler.      LEFT VENTRICLE  PLAX 2D  LVIDd:         4.48 cm   Diastology  LVIDs:         3.58 cm   LV e' medial:   9.46 cm/s  LV PW:         1.27 cm   LV E/e' medial: 10.0  LV IVS:        0.98 cm  LVOT diam:     2.20 cm  LV SV:         39  LV SV Index:   19  LVOT Area:     3.80 cm      LEFT ATRIUM           Index  LA diam:      3.30 cm 1.61 cm/m  LA Vol (A2C): 63.7 ml 31.08 ml/m  LA Vol (A4C): 87.6 ml 42.75 ml/m   AORTIC VALVE                    PULMONIC VALVE  AV Area (Vmax):    2.73 cm     PV Vmax:       0.92 m/s  AV Area (Vmean):   2.61 cm     PV Vmean:      61.700 cm/s  AV Area (VTI):     2.37 cm     PV VTI:        0.151 m  AV Vmax:           91.80 cm/s   PV Peak grad:  3.4 mmHg  AV Vmean:          63.300 cm/s  PV Mean grad:  2.0 mmHg  AV VTI:            0.165 m  AV Peak Grad:      3.4 mmHg  AV Mean Grad:      2.0 mmHg  LVOT Vmax:         65.90 cm/s  LVOT Vmean:        43.400 cm/s  LVOT VTI:          0.103 m  LVOT/AV VTI ratio: 0.62     AORTA  Ao Root diam: 3.60 cm   MITRAL VALVE  MV Area (PHT): 3.17 cm    SHUNTS  MV Area VTI:  1.68 cm    Systemic VTI:  0.10 m  MV Peak grad:  6.2 mmHg    Systemic Diam: 2.20 cm  MV Mean grad:   2.0 mmHg  MV Vmax:       1.24 m/s  MV Vmean:      65.5 cm/s  MV Decel Time: 240 msec  MV E velocity: 94.40 cm/s   Kathlyn Sacramento MD  Electronically signed by Kathlyn Sacramento MD  Signature Date/Time: 03/25/2021/11:43:37 AM         Final    Results for orders placed or performed in visit on 08/11/21  CUP PACEART REMOTE DEVICE CHECK  Result Value Ref Range   Pulse Generator Manufacturer MERM    Date Time Interrogation Session [phone]    Pulse Gen Model LNQ22 LINQ II    Pulse Gen Serial Number [id number] [place] [facility] Name [facility]    Implantable Pulse Generator Type ICM/ILR    Implantable Pulse Generator Implant Date [id number]       Assessment & Plan:   Problem List Items Addressed This Visit     Venous insufficiency of both lower extremities - Primary    Differential dx reviewed Labs and imaging show normal liver kidney heart function Likely swelling from venous insufficiency  Use RICE therapy: - R - Rest / relative rest with activity modification avoid overuse of joint - I - Ice packs (make sure you use a towel or sock / something to protect skin) - C - Compression with COMPRESSION SOCKS to apply pressure and reduce swelling allowing more support - E - Elevation - if significant swelling, lift leg above heart level (toes above your nose) to help reduce swelling, most helpful at night after day of being on your feet  Increase water, limit other fluids/drinks if possible.  Compression Socks - can start with lower gauge pressure 15-20 mmHg, eventually if helpful and you need more, can go up to 20-30. But may not start with that one.  Future consider diagnostic venous US imaging and diuretic if indicated    No orders of the defined types were placed in this encounter.     Follow up plan: Return if symptoms worsen or fail to improve.   Nobie Putnam, [facility] 09/15/2021, 3:34 PM

## 2021-09-16 LAB — CUP PACEART REMOTE DEVICE CHECK
Date Time Interrogation Session: 20230607003457
Implantable Pulse Generator Implant Date: 20220922

## 2021-10-06 NOTE — Progress Notes (Signed)
Carelink Summary Report / Loop Recorder 

## 2021-10-16 LAB — CUP PACEART REMOTE DEVICE CHECK
Date Time Interrogation Session: 20230713090330
Implantable Pulse Generator Implant Date: 20220922

## 2021-10-20 ENCOUNTER — Ambulatory Visit (INDEPENDENT_AMBULATORY_CARE_PROVIDER_SITE_OTHER): Payer: No Typology Code available for payment source

## 2021-10-20 DIAGNOSIS — R55 Syncope and collapse: Secondary | ICD-10-CM

## 2021-10-21 NOTE — Progress Notes (Unsigned)
Electrophysiology Office Follow up Visit Note:    Date:  10/21/2021   ID:  Elijah Ponder Sr., DOB 09/12/1934, MRN [id number]  PCP:  Olin Hauser, DO  [place] Cardiologist:  Vickie Epley, MD  [facility] Electrophysiologist:  Vickie Epley, MD    Interval History:    Elijah Egerton Sr. is a 87 y.o. male who presents for a follow up visit. They were last seen in clinic April 23, 2021.  He has a loop recorder implanted for history of syncope.  He also carries a diagnosis of persistent atrial fibrillation. He is with his wife left previously met.  He has noticed no changes in his atrial fibrillation.  He takes Eliquis twice daily without any bleeding issues. His wife is considering getting an apple airtag given his history of dementia.     Past Medical History:  Diagnosis Date   CKD (chronic kidney disease), stage II    Diastolic dysfunction    a. 03/2018 Echo: EF 60-65%, GrI DD; b. 03/2021 Echo: EF 55-60%, no rwma, mild LVH, nl RV fxn, mild-mod LA dil.   Dyslipidemia    GERD (gastroesophageal reflux disease)    Glaucoma    History of stress test    a. 08/2020 ETT: Max HR 125 (91%). 4.6 METS. ECG w/ isolated PVCs, intermittent Mobitz I & II.   Hyperlipidemia    Hypertension    Insomnia    Mobitz type 1 second degree atrioventricular block    Mobitz type II atrioventricular block    Myasthenia gravis ([place]) 09/08/2012   Obesity    Ocular myasthenia gravis ([place])    Persistent atrial fibrillation (HCC)    a. CHA2DS2VASc = 3.   Pre-syncope    a. 08/2020 Zio: NSVT, Mobitz I, 2 episodes of high degree AVB during sleep.   Prostate cancer [facility])     Past Surgical History:  Procedure Laterality Date   CARDIOVERSION N/A 05/14/2021   Procedure: CARDIOVERSION;  Surgeon: Kate Sable, MD;  Location: [facility] ORS;  Service: Cardiovascular;  Laterality: N/A;   CATARACT EXTRACTION Bilateral    CHOLECYSTECTOMY     TRANSURETHRAL RESECTION OF PROSTATE       Current Medications: No outpatient medications have been marked as taking for the 10/22/21 encounter (Appointment) with Vickie Epley, MD.     Allergies:   Pravastatin and Simvastatin   Social History   Socioeconomic History   Marital status: Married    Spouse name: Crystal   Number of children: 3   Years of education: 16   Highest education level: Not on file  Occupational History    Comment: retired  Tobacco Use   Smoking status: Former    Types: Cigarettes    Quit date: 04/06/1970    Years since quitting: 51.5   Smokeless tobacco: Former  Scientific laboratory technician Use: Never used  Substance and Sexual Activity   Alcohol use: Yes    Alcohol/week: 0.0 standard drinks of alcohol    Comment: Consumes alcohol on occasion   Drug use: No   Sexual activity: Not on file  Other Topics Concern   Not on file  Social History Narrative   Patient lives at home with his wife Veterinary surgeon)   Retired - [employer]   [place]   Right handed.   Caffeine- four cups daily.            Social Determinants of Health   Financial Resource Strain: Low Risk  (12/17/2020)   Overall  Financial Resource Strain (CARDIA)    Difficulty of Paying Living Expenses: Not hard at all  Food Insecurity: No Food Insecurity (12/17/2020)   Hunger Vital Sign    Worried About Running Out of Food in the Last Year: Never true    Ran Out of Food in the Last Year: Never true  Transportation Needs: No Transportation Needs (12/17/2020)   PRAPARE - Hydrologist (Medical): No    Lack of Transportation (Non-Medical): No  Physical Activity: Inactive (12/17/2020)   Exercise Vital Sign    Days of Exercise per Week: 0 days    Minutes of Exercise per Session: 0 min  Stress: No Stress Concern Present (12/17/2020)   [place]    Feeling of Stress : Not at all  Social Connections: Not on file     Family History: The  patient's family history includes Heart attack in his father.  ROS:   Please see the history of present illness.    All other systems reviewed and are negative.  EKGs/Labs/Other Studies Reviewed:    The following studies were reviewed today:     Recent Labs: 03/22/2021: B Natriuretic Peptide 100.7 04/22/2021: ALT 20 04/24/2021: Hemoglobin 13.9; Platelets 233 07/23/2021: BUN 29; Creatinine, Ser 1.11; Potassium 4.0; Sodium 143  Recent Lipid Panel No results found for: "CHOL", "TRIG", "HDL", "CHOLHDL", "VLDL", "LDLCALC", "LDLDIRECT"  Physical Exam:    VS:  There were no vitals taken for this visit.    Wt Readings from Last 3 Encounters:  09/15/21 195 lb 6.4 oz (88.6 kg)  07/23/21 191 lb 8 oz (86.9 kg)  07/09/21 188 lb 12.8 oz (85.6 kg)     GEN: Elderly well nourished, well developed in no acute distress HEENT: Normal NECK: No JVD; No carotid bruits LYMPHATICS: No lymphadenopathy CARDIAC: Irregularly irregular, no murmurs, rubs, gallops RESPIRATORY:  Clear to auscultation without rales, wheezing or rhonchi  ABDOMEN: Soft, non-tender, non-distended MUSCULOSKELETAL:  No edema; No deformity  SKIN: Warm and dry NEUROLOGIC:  Alert and oriented x 3 PSYCHIATRIC:  Normal affect        ASSESSMENT:    1. Persistent atrial fibrillation ([place])   2. Syncope, unspecified syncope type    PLAN:    In order of problems listed above:   #Persistent atrial fibrillation Approaching permanent.  On Eliquis for stroke prophylaxis.  Rate controlled with a ventricular rate of 55 bpm today.  #Syncope Monitoring his heart rhythms using the loop recorder.  Has not been a recurrent issue.   Follow-up in 1 year with APP  Medication Adjustments/Labs and Tests Ordered: Current medicines are reviewed at length with the patient today.  Concerns regarding medicines are outlined above.  No orders of the defined types were placed in this encounter.  No orders of the defined types were placed  in this encounter.    Signed, Lars Mage, MD, [facility], [facility] 10/21/2021 5:45 PM    Electrophysiology [facility]

## 2021-10-22 ENCOUNTER — Encounter: Payer: Self-pay | Admitting: Cardiology

## 2021-10-22 ENCOUNTER — Ambulatory Visit (INDEPENDENT_AMBULATORY_CARE_PROVIDER_SITE_OTHER): Payer: No Typology Code available for payment source | Admitting: Cardiology

## 2021-10-22 VITALS — BP 110/56 | HR 55 | Ht 68.0 in | Wt 190.0 lb

## 2021-10-22 DIAGNOSIS — R55 Syncope and collapse: Secondary | ICD-10-CM | POA: Diagnosis not present

## 2021-10-22 DIAGNOSIS — I4819 Other persistent atrial fibrillation: Secondary | ICD-10-CM | POA: Diagnosis not present

## 2021-10-22 NOTE — Patient Instructions (Signed)
Medications: Your physician recommends that you continue on your current medications as directed. Please refer to the Current Medication list given to you today. *If you need a refill on your cardiac medications before your next appointment, please call your pharmacy*  Lab Work: None. If you have labs (blood work) drawn today and your tests are completely normal, you will receive your results only by: [place] (if you have MyChart) OR A paper copy in the mail If you have any lab test that is abnormal or we need to change your treatment, we will call you to review the results.  Testing/Procedures: None.  Follow-Up: At [facility], you and your health needs are our priority.  As part of our continuing mission to provide you with exceptional heart care, we have created designated Provider Care Teams.  These Care Teams include your primary Cardiologist (physician) and Advanced Practice Providers (APPs -  Physician Assistants and Nurse Practitioners) who all work together to provide you with the care you need, when you need it.  Your physician wants you to follow-up in: 12 months with Lars Mage, MD or one of the following Advanced Practice Providers on your designated Care Team:    Ignacia Bayley, NP Christell Faith PA Cadence Kathlen Mody PA    You will receive a reminder letter in the mail two months in advance. If you don't receive a letter, please call our office to schedule the follow-up appointment.  We recommend signing up for the patient portal called "MyChart".  Sign up information is provided on this After Visit Summary.  MyChart is used to connect with patients for Virtual Visits (Telemedicine).  Patients are able to view lab/test results, encounter notes, upcoming appointments, etc.  Non-urgent messages can be sent to your provider as well.   To learn more about what you can do with MyChart, go to [email or web address].    Any Other Special Instructions Will Be Listed Below (If  Applicable).

## 2021-11-14 ENCOUNTER — Ambulatory Visit: Payer: Medicare Other | Admitting: Family Medicine

## 2021-11-14 ENCOUNTER — Encounter: Payer: Self-pay | Admitting: Family Medicine

## 2021-11-14 VITALS — BP 131/57 | HR 55 | Ht 68.0 in | Wt 194.6 lb

## 2021-11-14 DIAGNOSIS — I872 Venous insufficiency (chronic) (peripheral): Secondary | ICD-10-CM | POA: Diagnosis not present

## 2021-11-14 MED ORDER — FUROSEMIDE 20 MG PO TABS: 20.0000 mg | ORAL_TABLET | Freq: Every day | ORAL | 0 refills | Status: DC | PRN

## 2021-11-14 NOTE — Addendum Note (Signed)
Addended by: Olin Hauser on: 11/14/2021 01:20 PM   Modules accepted: Orders, Level of Service

## 2021-11-14 NOTE — Progress Notes (Addendum)
Subjective:    Patient ID: Elijah Ponder Sr., male    DOB: 1935-03-12, 87 y.o.   MRN: [id number]  Elijah Guedes Sr. is a 88 y.o. male presenting on 11/14/2021 for Foot Swelling   HPI  Left Knee Pain Acute on chronic For past 2 weeks, gradual onset, without injury. History of arthritis. Uses cane for ambulation. Pain is below knee, pain with ambulation. Has swelling in lower legs.  Lower Ext Edema Last seen for same issue 09/2021, since that time has tried compression elevation without improvement. No change in diet or salt.  Drinks some beer less water No redness pain or itching, no ulceration  Last labs 04/2021 show low protein and albumin as likely contributing factor Interested in other options for treating edema. Not on diuretic      06/27/2021    9:52 AM 12/17/2020   11:01 AM 12/13/2019   11:32 AM  Depression screen PHQ 2/9  Decreased Interest 0 0 0  Down, Depressed, Hopeless 0 0 0  PHQ - 2 Score 0 0 0  Altered sleeping 0    Tired, decreased energy 0    Change in appetite 0    Feeling bad or failure about yourself  0    Trouble concentrating 0    Moving slowly or fidgety/restless 0    Suicidal thoughts 0    PHQ-9 Score 0    Difficult doing work/chores Not difficult at all      Social History   Tobacco Use   Smoking status: Former    Types: Cigarettes    Quit date: 04/06/1970    Years since quitting: 51.6   Smokeless tobacco: Former  Scientific laboratory technician Use: Never used  Substance Use Topics   Alcohol use: Yes    Alcohol/week: 0.0 standard drinks of alcohol    Comment: Consumes alcohol on occasion   Drug use: No    Review of Systems Per HPI unless specifically indicated above     Objective:    BP (!) 131/57   Pulse (!) 55   Ht '5\' 8"'$  (1.727 m)   Wt 194 lb 9.6 oz (88.3 kg)   SpO2 98%   BMI 29.59 kg/m   Wt Readings from Last 3 Encounters:  11/14/21 194 lb 9.6 oz (88.3 kg)  10/22/21 190 lb (86.2 kg)  09/15/21 195 lb 6.4 oz (88.6 kg)    Physical  Exam Vitals and nursing note reviewed.  Constitutional:      General: He is not in acute distress.    Appearance: He is well-developed. He is not diaphoretic.     Comments: Well-appearing, comfortable, cooperative  HENT:     Head: Normocephalic and atraumatic.  Eyes:     General:        Right eye: No discharge.        Left eye: No discharge.     Conjunctiva/sclera: Conjunctivae normal.  Neck:     Thyroid: No thyromegaly.  Cardiovascular:     Rate and Rhythm: Normal rate and regular rhythm.     Pulses: Normal pulses.     Heart sounds: Normal heart sounds. No murmur heard. Pulmonary:     Effort: Pulmonary effort is normal. No respiratory distress.     Breath sounds: Normal breath sounds. No wheezing or rales.  Musculoskeletal:        General: Normal range of motion.     Cervical back: Normal range of motion and neck supple.     Right lower  leg: Edema (pitting +2-3 bilateral no erythema non tender) present.     Left lower leg: Edema present.     Comments: L knee has some mild bulkiness effusion, crepitus  Lymphadenopathy:     Cervical: No cervical adenopathy.  Skin:    General: Skin is warm and dry.     Findings: No erythema or rash.  Neurological:     Mental Status: He is alert and oriented to person, place, and time. Mental status is at baseline.  Psychiatric:        Behavior: Behavior normal.     Comments: Well groomed, good eye contact, normal speech and thoughts    Results for orders placed or performed in visit on 10/20/21  CUP PACEART REMOTE DEVICE CHECK  Result Value Ref Range   Pulse Generator Manufacturer MERM    Date Time Interrogation Session [id number]    Pulse Gen Model LNQ22 LINQ II    Pulse Gen Serial Number [id number] [place] [facility] Name [facility]    Implantable Pulse Generator Type ICM/ILR    Implantable Pulse Generator Implant Date [id number]       Assessment & Plan:   Problem List Items Addressed This Visit     Venous insufficiency of both  lower extremities - Primary   Relevant Medications   furosemide (LASIX) 20 MG tablet   Other Relevant Orders   Ambulatory referral to Vascular Surgery    Multifactorial Lower ext edema [place] to venous insufficiency and also low albumin / protein on last lab 04/2021  No other evidence of liver damage, we discussed nutrition importance and fluid balance.  Will refer to Vascular for further consultation  [facility] at [place],  [place]  [address] Main: [phone]  Start trial on diuertic - Take Fluid pill, furosemide lasix '20mg'$  daily as needed for swelling, will inc urination to get rid of swelling  Note for L Knee pain they plan to see his Orthopedic in near future.  Orders Placed This Encounter  Procedures   Ambulatory referral to Vascular Surgery    Referral Priority:   Routine    Referral Type:   Surgical    Referral Reason:   Specialty Services Required    Requested Specialty:   Vascular Surgery    Number of Visits Requested:   1     Meds ordered this encounter  Medications   furosemide (LASIX) 20 MG tablet    Sig: Take 1 tablet (20 mg total) by mouth daily as needed for edema or fluid.    Dispense:  30 tablet    Refill:  0      Follow up plan: Return if symptoms worsen or fail to improve.   Nobie Putnam, [facility] 11/14/2021, 11:43 AM

## 2021-11-14 NOTE — Patient Instructions (Addendum)
Thank you for coming to the office today.  [facility] at [place],  [place]  [address] Get Driving Directions Main: [phone]  Take Fluid pill, furosemide lasix '20mg'$  daily as needed for swelling, will inc urination to get rid of swelling  Also discussed today likely causes with leaky veins and also with lower protein in the blood that can explain why the fluid shifts and causes swelling. That however is difficult to treat.  Goal to maintain nutrition with adequate protein.   Please schedule a Follow-up Appointment to: Return if symptoms worsen or fail to improve.  If you have any other questions or concerns, please feel free to call the office or send a message through [place]. You may also schedule an earlier appointment if necessary.  Additionally, you may be receiving a survey about your experience at our office within a few days to 1 week by e-mail or mail. We value your feedback.  Nobie Putnam, DO [place]

## 2021-11-20 LAB — CUP PACEART REMOTE DEVICE CHECK
Date Time Interrogation Session: 20230812003555
Implantable Pulse Generator Implant Date: 20220922

## 2021-11-21 NOTE — Progress Notes (Signed)
Carelink Summary Report / Loop Recorder 

## 2021-11-24 ENCOUNTER — Ambulatory Visit (INDEPENDENT_AMBULATORY_CARE_PROVIDER_SITE_OTHER): Payer: No Typology Code available for payment source

## 2021-11-24 DIAGNOSIS — I4819 Other persistent atrial fibrillation: Secondary | ICD-10-CM | POA: Diagnosis not present

## 2021-12-03 ENCOUNTER — Ambulatory Visit (INDEPENDENT_AMBULATORY_CARE_PROVIDER_SITE_OTHER): Payer: Medicare Other | Admitting: Orthopaedic Surgery

## 2021-12-03 ENCOUNTER — Ambulatory Visit (INDEPENDENT_AMBULATORY_CARE_PROVIDER_SITE_OTHER): Payer: Medicare Other

## 2021-12-03 DIAGNOSIS — M25562 Pain in left knee: Secondary | ICD-10-CM

## 2021-12-03 DIAGNOSIS — M25462 Effusion, left knee: Secondary | ICD-10-CM | POA: Diagnosis not present

## 2021-12-03 DIAGNOSIS — G8929 Other chronic pain: Secondary | ICD-10-CM | POA: Diagnosis not present

## 2021-12-03 DIAGNOSIS — M1712 Unilateral primary osteoarthritis, left knee: Secondary | ICD-10-CM

## 2021-12-03 MED ORDER — LIDOCAINE HCL 1 % IJ SOLN
3.0000 mL | INTRAMUSCULAR | Status: AC | PRN
  Administered 2021-12-03: 3 mL

## 2021-12-03 MED ORDER — METHYLPREDNISOLONE ACETATE 40 MG/ML IJ SUSP
40.0000 mg | INTRAMUSCULAR | Status: AC | PRN
  Administered 2021-12-03: 40 mg via INTRA_ARTICULAR

## 2021-12-03 NOTE — Progress Notes (Signed)
Office Visit Note   Patient: Elijah Martin Sr.           Date of Birth: October 10, 1934           MRN: [id number] Visit Date: 12/03/2021              Requested by: Olin Hauser, DO [address] [place],  [place] [id number] PCP: Olin Hauser, DO   Assessment & Plan: Visit Diagnoses:  1. Chronic pain of left knee   2. Effusion, left knee   3. Unilateral primary osteoarthritis, left knee     Plan: I did explain that he has osteoarthritis of the left knee.  I went over the x-rays with him.  I did recommend a steroid injection since he has not had that before and he agreed to this and tolerated well.  I was only able to aspirate about 10 cc of fluid from his knee.  This seems to be consistent with osteoarthritis.  All question concerns were answered addressed.  Another step in the future would be considering a hyaluronic acid injection.  They will call us if that is the case.  Follow-up is as needed.  Follow-Up Instructions: Return if symptoms worsen or fail to improve.   Orders:  Orders Placed This Encounter  Procedures   Large Joint Inj   XR Knee 1-2 Views Left   No orders of the defined types were placed in this encounter.     Procedures: Large Joint Inj: L knee on 12/03/2021 10:39 AM Indications: diagnostic evaluation and pain Details: 22 G 1.5 in needle, superolateral approach  Arthrogram: No  Medications: 3 mL lidocaine 1 %; 40 mg methylPREDNISolone acetate 40 MG/ML Outcome: tolerated well, no immediate complications Procedure, treatment alternatives, risks and benefits explained, specific risks discussed. Consent was given by the patient. Immediately prior to procedure a time out was called to verify the correct patient, procedure, equipment, support staff and site/side marked as required. Patient was prepped and draped in the usual sterile fashion.       Clinical Data: No additional findings.   Subjective: Chief Complaint  Patient presents with    Left Knee - Pain  The patient comes in today with left knee pain has been hurting for many years now.  He does ambulate using cane and he points to the medial aspect of his left knee as the source of his pain.Marland Kitchen  He has not ever had a steroid injection in his left knee.  He is never had knee surgery.  It does swell and he has a constant ache on the medial aspect of his knee.  He is not a diabetic.  He is slow to mobilize.  He denies any specific injury.  HPI  Review of Systems There is no fever, chills, nausea, vomiting  Objective: Vital Signs: There were no vitals taken for this visit.  Physical Exam He is alert and orient x3 and in no acute distress Ortho Exam Examination of his left knee shows varus malalignment that is correctable.  There is a mild effusion.  He has good range of motion of his knee but patellofemoral crepitation.  All the pain seems to be over the medial joint line.  The knee is ligamentously stable. Specialty Comments:  No specialty comments available.  Imaging: XR Knee 1-2 Views Left  Result Date: 12/03/2021 2 views of the left knee show tricompartment arthritis with mainly bone-on-bone wear the medial compartment with significant narrowing of the compartment and varus  malalignment.  There is only slight narrowing of the patellofemoral joint.    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    Dementia without behavioral disturbance, psychotic disturbance, mood disturbance, or anxiety ([place])    Pneumonia due to COVID-19 virus    Syncope and collapse 03/31/2018   Left-sided headache  01/25/2017   OSA (obstructive sleep apnea) 12/24/2016   Diarrhea 12/24/2016   Squamous cell carcinoma 09/07/2016   Cavitating mass of lung 04/17/2016   Gastritis 04/17/2016   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 03/2018 Echo: EF 60-65%, GrI DD; b. 03/2021 Echo: EF 55-60%, no rwma, mild LVH, nl RV fxn, mild-mod LA dil.   Dyslipidemia    GERD (gastroesophageal reflux disease)    Glaucoma    History of stress test    a. 08/2020 ETT: Max HR 125 (91%). 4.6 METS. ECG w/ isolated PVCs, intermittent Mobitz I & II.   Hyperlipidemia    Hypertension    Insomnia    Mobitz type 1 second degree atrioventricular block    Mobitz type II atrioventricular block    Myasthenia gravis ([place]) 09/08/2012   Obesity    Ocular myasthenia gravis ([place])    Persistent atrial fibrillation (HCC)    a. CHA2DS2VASc = 3.   Pre-syncope    a. 08/2020 Zio: NSVT, Mobitz I, 2 episodes of high degree AVB during sleep.   Prostate cancer ([place])     Family History  Problem Relation Age of Onset   Heart attack Father     Past Surgical History:  Procedure Laterality Date   CARDIOVERSION N/A 05/14/2021   Procedure: CARDIOVERSION;  Surgeon: Kate Sable, MD;  Location: [facility] ORS;  Service: Cardiovascular;  Laterality: N/A;   CATARACT EXTRACTION Bilateral    CHOLECYSTECTOMY     TRANSURETHRAL RESECTION OF PROSTATE     Social History   Occupational History    Comment: retired  Tobacco Use   Smoking status: Former    Types: Cigarettes     Quit date: 04/06/1970    Years since quitting: 51.6   Smokeless tobacco: Former  Scientific laboratory technician Use: Never used  Substance and Sexual Activity   Alcohol use: Yes    Alcohol/week: 0.0 standard drinks of alcohol    Comment: Consumes alcohol on occasion   Drug use: No   Sexual activity: Not on file

## 2021-12-19 ENCOUNTER — Ambulatory Visit: Payer: Medicare Other

## 2021-12-21 NOTE — Progress Notes (Signed)
Carelink Summary Report / Loop Recorder 

## 2021-12-23 ENCOUNTER — Ambulatory Visit: Payer: Medicare Other

## 2021-12-25 ENCOUNTER — Telehealth: Payer: Self-pay | Admitting: Cardiology

## 2021-12-25 NOTE — Telephone Encounter (Signed)
Faxed via Epic

## 2021-12-25 NOTE — Telephone Encounter (Signed)
Would like for pt's 11/24/21 Pacer Check report to be sent via fax. Please advise

## 2022-02-02 ENCOUNTER — Ambulatory Visit: Payer: No Typology Code available for payment source

## 2022-02-04 LAB — CUP PACEART REMOTE DEVICE CHECK
Date Time Interrogation Session: 20231031121744
Implantable Pulse Generator Implant Date: 20220922
Zone Setting Status: 755011
Zone Setting Status: 755011
Zone Setting Status: 755011
Zone Setting Status: 755011

## 2022-02-10 ENCOUNTER — Telehealth: Payer: Self-pay

## 2022-02-10 NOTE — Telephone Encounter (Signed)
Received the following transmission from [employer]:  ILR pause alert logged 02/09/22 @ 1223, 4 sec. Hx of nocturnal pauses. Routing due to time of occurrence. Known persistent AF, rate control strategy (followed by AF clinic) [place]- [facility]qiuis - JJB  Patient had a 4 sec pause during the middle of the day.  Spoke with wife who confirmed that patient was sleeping during that time.  Reviewed with Dr. Lovena Le in office today as Dr. Quentin Ore is out of office.  No changes at this time, will forward to Dr. Quentin Ore as Juluis Rainier when he returns. Note: patient has persistent AF, atrial burden remains the same, patient is on Eliquis.

## 2022-03-10 ENCOUNTER — Ambulatory Visit (INDEPENDENT_AMBULATORY_CARE_PROVIDER_SITE_OTHER): Payer: Medicare Other

## 2022-03-10 DIAGNOSIS — Z0000 Encounter for general adult medical examination without abnormal findings: Secondary | ICD-10-CM

## 2022-03-10 NOTE — Progress Notes (Signed)
I connected with  [name] & Georgiana Shore on 03/10/22 by a audio enabled telemedicine application and verified that I am speaking with the correct person using two identifiers.  Patient Location: Home  Provider Location: Office/Clinic  I discussed the limitations of evaluation and management by telemedicine. The patient expressed understanding and agreed to proceed.   Subjective:   Elijah Bubolz Sr. is a 87 y.o. male who presents for Medicare Annual/Subsequent preventive examination.  Review of Systems    Per HPI unless specifically indicated below.  Cardiac Risk Factors include: advanced age (>28mn, >>4women);male gender, essential hypertension, and hyperlipidemia.           Objective:        11/14/2021   11:23 AM 10/22/2021   11:20 AM 09/15/2021    3:22 PM  Vitals with BMI  Height '5\' 8"'$  '5\' 8"'$  '5\' 9"'$   Weight 194 lbs 10 oz 190 lbs 195 lbs 6 oz  BMI 29.6 [phone] Systolic [id number] Diastolic 57 56 80  Pulse 55 55 64    There were no vitals filed for this visit. There is no height or weight on file to calculate BMI.     04/24/2021   10:41 AM 03/24/2021    2:59 PM 12/17/2020   11:00 AM 01/11/2019    2:02 PM 03/31/2018    4:00 PM 01/12/2017   10:00 PM 01/12/2017   10:03 AM  Advanced Directives  Does Patient Have a Medical Advance Directive? Yes Yes Yes No Yes Yes No  Type of Advance Directive Living will Living will;Healthcare Power of [place]  Living [place]  Does patient want to make changes to medical advance directive?  No - Patient declined   No - Patient declined No - Patient declined   Copy of [place]?  No - copy requested No - copy requested        Current Medications (verified) Outpatient Encounter Medications as of 03/10/2022  Medication Sig   apixaban (ELIQUIS) 5 MG TABS tablet Take 1 tablet (5 mg total) by mouth 2 (two) times daily.    donepezil (ARICEPT) 10 MG tablet Take 10 mg by mouth at bedtime.   furosemide (LASIX) 20 MG tablet Take 1 tablet (20 mg total) by mouth daily as needed for edema or fluid.   lactose free nutrition (BOOST) LIQD Take 237 mLs by mouth 2 (two) times daily.   lidocaine (LIDODERM) 5 % Place 2 patches onto the skin daily as needed (pain). Remove & Discard patch within 12 hours or as directed by MD   memantine (NAMENDA) 10 MG tablet Take 20 mg by mouth at bedtime.   methocarbamol (ROBAXIN) 500 MG tablet TAKE ONE TABLET BY MOUTH THREE TIMES A DAY AS NEEDED FOR MUSCLE TENSION HEADACHES DURING THE DAY; MAY START WITH HALF TABS   omeprazole (PRILOSEC) 40 MG capsule Take 1 capsule (40 mg total) by mouth 2 (two) times daily before a meal.   predniSONE (DELTASONE) 1 MG tablet Take 2 mg by mouth daily with breakfast. (Take with '5mg'$  tablet to equal '7mg'$  total)   predniSONE (DELTASONE) 5 MG tablet Take 5 mg by mouth daily. (Take with '2mg'$  tablets to equal '7mg'$  total)   pyridostigmine (MESTINON) 60 MG tablet Take 60 mg by mouth 2 (two) times daily.   No facility-administered encounter medications on file as of 03/10/2022.    Allergies (verified) Pravastatin  and Simvastatin   History: Past Medical History:  Diagnosis Date   CKD (chronic kidney disease), stage II    Diastolic dysfunction    a. 03/2018 Echo: EF 60-65%, GrI DD; b. 03/2021 Echo: EF 55-60%, no rwma, mild LVH, nl RV fxn, mild-mod LA dil.   Dyslipidemia    GERD (gastroesophageal reflux disease)    Glaucoma    History of stress test    a. 08/2020 ETT: Max HR 125 (91%). 4.6 METS. ECG w/ isolated PVCs, intermittent Mobitz I & II.   Hyperlipidemia    Hypertension    Insomnia    Mobitz type 1 second degree atrioventricular block    Mobitz type II atrioventricular block    Myasthenia gravis ([place]) 09/08/2012   Obesity    Ocular myasthenia gravis ([place])    Persistent atrial fibrillation (HCC)    a. CHA2DS2VASc = 3.   Pre-syncope    a. 08/2020 Zio:  NSVT, Mobitz I, 2 episodes of high degree AVB during sleep.   Prostate cancer [facility])    Past Surgical History:  Procedure Laterality Date   CARDIOVERSION N/A 05/14/2021   Procedure: CARDIOVERSION;  Surgeon: Kate Sable, MD;  Location: [facility] ORS;  Service: Cardiovascular;  Laterality: N/A;   CATARACT EXTRACTION Bilateral    CHOLECYSTECTOMY     TRANSURETHRAL RESECTION OF PROSTATE     Family History  Problem Relation Age of Onset   Heart attack Father    Social History   Socioeconomic History   Marital status: Married    Spouse name: Crystal   Number of children: 3   Years of education: 16   Highest education level: Not on file  Occupational History    Comment: retired  Tobacco Use   Smoking status: Former    Types: Cigarettes    Quit date: 04/06/1970    Years since quitting: 51.9   Smokeless tobacco: Former  Scientific laboratory technician Use: Never used  Substance and Sexual Activity   Alcohol use: Yes    Alcohol/week: 0.0 standard drinks of alcohol    Comment: Consumes alcohol on occasion   Drug use: No   Sexual activity: Not on file  Other Topics Concern   Not on file  Social History Narrative   Patient lives at home with his wife Veterinary surgeon)   Retired - [employer]   [place]   Right handed.   Caffeine- four cups daily.            Social Determinants of Health   Financial Resource Strain: Low Risk  (03/10/2022)   Overall Financial Resource Strain (CARDIA)    Difficulty of Paying Living Expenses: Not hard at all  Food Insecurity: No Food Insecurity (03/10/2022)   Hunger Vital Sign    Worried About Running Out of Food in the Last Year: Never true    Ran Out of Food in the Last Year: Never true  Transportation Needs: No Transportation Needs (03/10/2022)   PRAPARE - Hydrologist (Medical): No    Lack of Transportation (Non-Medical): No  Physical Activity: Inactive (03/10/2022)   Exercise Vital Sign    Days of Exercise per Week: 0 days     Minutes of Exercise per Session: 0 min  Stress: No Stress Concern Present (03/10/2022)   [place]    Feeling of Stress : Not at all  Social Connections: Moderately Isolated (03/10/2022)   Social Connection and Isolation Panel [NHANES]  Frequency of Communication with Friends and Family: More than three times a week    Frequency of Social Gatherings with Friends and Family: Once a week    Attends Religious Services: Never    Marine scientist or Organizations: No    Attends Music therapist: Never    Marital Status: Married    Tobacco Counseling Counseling given: Not Answered   Clinical Intake:  Pre-visit preparation completed: No  Pain : No/denies pain     Nutritional Status: BMI 25 -29 Overweight Nutritional Risks: None Diabetes: No  How often do you need to have someone help you when you read instructions, pamphlets, or other written materials from your doctor or pharmacy?: 4 - Often  Diabetic?No      Information entered by :: Donnie Mesa, CMA   Activities of Daily Living    03/10/2022   10:36 AM 05/14/2021    7:05 AM  In your present state of health, do you have any difficulty performing the following activities:  Hearing? 0 0  Vision? 1 0  Comment 05/2022 [facility]   Difficulty concentrating or making decisions? 1 0  Walking or climbing stairs? 1 0  Dressing or bathing? 0 0  Doing errands, shopping? 0     Patient Care Team: Olin Hauser, DO as PCP - General (Family Medicine) Vickie Epley, MD as PCP - Electrophysiology (Cardiology) Vickie Epley, MD as PCP - Cardiology (Cardiology)  Indicate any recent Medical Services you may have received from other than [facility] providers in the past year (date may be approximate). The pt was seen at [facility] on 05/14/21, for a Cardioversion.     Assessment:   This is a routine  wellness examination for Elijah Jackson.  Hearing/Vision screen Denies any hearing issues. Denies any changes with his vision. Annual eye Exam, [facility].  Dietary issues and exercise activities discussed: Current Exercise Habits: The patient does not participate in regular exercise at present, Exercise limited by: None identified   Goals Addressed   None    Depression Screen    03/10/2022   10:35 AM 06/27/2021    9:52 AM 12/17/2020   11:01 AM 12/13/2019   11:32 AM 04/04/2018    3:00 PM 09/07/2016    2:52 PM  PHQ 2/9 Scores  PHQ - 2 Score 0 0 0 0 0 0  PHQ- 9 Score  0        Fall Risk    03/10/2022   10:36 AM 06/27/2021    9:52 AM 04/22/2021    1:59 PM 12/17/2020   11:01 AM 12/13/2019   11:32 AM  Fall Risk   Falls in the past year? 1 0 1 0 0  Number falls in past yr: 1 0 1  0  Injury with Fall? 1 0 0  0  Risk for fall due to : Impaired balance/gait History of fall(s) History of fall(s);Impaired balance/gait;Impaired mobility Medication side effect   Follow up Falls evaluation completed Falls evaluation completed Falls evaluation completed Falls evaluation completed;Education provided;Falls prevention discussed Falls evaluation completed    FALL RISK PREVENTION PERTAINING TO THE HOME:  Any stairs in or around the home? Yes  If so, are there any without handrails? No  Home free of loose throw rugs in walkways, pet beds, electrical cords, etc? Yes  Adequate lighting in your home to reduce risk of falls? Yes   ASSISTIVE DEVICES UTILIZED TO PREVENT FALLS:  Life alert? No  Use of a cane, walker or w/c? Yes  Grab bars in the bathroom? Yes  Shower chair or bench in shower? Yes  Elevated toilet seat or a handicapped toilet? Yes   TIMED UP AND GO:  Was the test performed?  unable to perform, virtual telephone visit  .  Cognitive Function:        03/10/2022   10:37 AM 12/17/2020   11:02 AM  6CIT Screen  What Year? 4 points 0 points  What month? 3 points 0 points  What  time? 0 points 0 points  Count back from 20 0 points 2 points  Months in reverse 0 points 0 points  Repeat phrase 10 points 10 points  Total Score 17 points 12 points    Immunizations Immunization History  Administered Date(s) Administered   Influenza Split 12/10/2016   Influenza, High Dose Seasonal PF 01/08/2015, 01/27/2016, 02/13/2016, 01/25/2019   Influenza, Seasonal, Injecte, Preservative Fre 02/25/2009, 01/01/2010, 01/11/2012, 04/13/2013, 05/02/2014   Influenza-Unspecified 01/05/2008, 12/31/2017, 12/06/2019   PFIZER(Purple Top)SARS-COV-2 Vaccination 05/20/2019, 06/14/2019, 01/03/2020   Pneumococcal Conjugate-13 10/09/2013   Pneumococcal Polysaccharide-23 09/04/2005, 11/11/2016   Td 05/21/2014, 01/11/2019   Tdap 10/09/2012, 10/21/2012    TDAP status: Up to date  Influenza Vaccine: Up to date   Pneumococcal vaccine status: Up to date  Covid-19 vaccine status: Information provided on how to obtain vaccines.   Qualifies for Shingles Vaccine? Yes   Zostavax completed No   Shingrix Completed?: No.    Education has been provided regarding the importance of this vaccine. Patient has been advised to call insurance company to determine out of pocket expense if they have not yet received this vaccine. Advised may also receive vaccine at local pharmacy or Health Dept. Verbalized acceptance and understanding.  Screening Tests Health Maintenance  Topic Date Due   Zoster Vaccines- Shingrix (1 of 2) Never done   INFLUENZA VACCINE  11/04/2021   COVID-19 Vaccine (4 - 2023-24 season) 12/05/2021   Medicare Annual Wellness (AWV)  03/11/2023   DTaP/Tdap/Td (5 - Td or Tdap) 01/10/2029   Pneumonia Vaccine 57+ Years old  Completed   HPV VACCINES  Aged Out    Health Maintenance  Health Maintenance Due  Topic Date Due   Zoster Vaccines- Shingrix (1 of 2) Never done   INFLUENZA VACCINE  11/04/2021   COVID-19 Vaccine (4 - 2023-24 season) 12/05/2021    Colorectal cancer screening: No  longer required.   Lung Cancer Screening: (Low Dose CT Chest recommended if Age 56-80 years, 30 pack-year currently smoking OR have quit w/in 15years.) does not qualify.   Lung Cancer Screening Referral: not applicable   Additional Screening:  Hepatitis C Screening: does not qualify;  Vision Screening: Recommended annual ophthalmology exams for early detection of glaucoma and other disorders of the eye. Is the patient up to date with their annual eye exam?  Yes  Who is the provider or what is the name of the office in which the patient attends annual eye exams? [facility]  If pt is not established with a provider, would they like to be referred to a provider to establish care? No .   Dental Screening: Recommended annual dental exams for proper oral hygiene  Community Resource Referral / Chronic Care Management: CRR required this visit?  No   CCM required this visit?  No      Plan:     I have personally reviewed and noted the following in the patient's chart:   Medical and social  history Use of alcohol, tobacco or illicit drugs  Current medications and supplements including opioid prescriptions. Patient is not currently taking opioid prescriptions. Functional ability and status Nutritional status Physical activity Advanced directives List of other physicians Hospitalizations, surgeries, and ER visits in previous 12 months Vitals Screenings to include cognitive, depression, and falls Referrals and appointments  In addition, I have reviewed and discussed with patient certain preventive protocols, quality metrics, and best practice recommendations. A written personalized care plan for preventive services as well as general preventive health recommendations were provided to patient.    Elijah Jackson , Thank you for taking time to come for your Medicare Wellness Visit. I appreciate your ongoing commitment to your health goals. Please review the following plan we  discussed and let me know if I can assist you in the future.   These are the goals we discussed:  Goals      Patient Stated     12/17/2020, no goals        This is a list of the screening recommended for you and due dates:  Health Maintenance  Topic Date Due   Zoster (Shingles) Vaccine (1 of 2) Never done   Flu Shot  11/04/2021   COVID-19 Vaccine (4 - 2023-24 season) 12/05/2021   Medicare Annual Wellness Visit  03/11/2023   DTaP/Tdap/Td vaccine (5 - Td or Tdap) 01/10/2029   Pneumonia Vaccine  Completed   HPV Vaccine  Aged Out     [place], [place]   03/10/2022   Nurse Notes: Approximately 30 minute Non-Face -To-Face Medicare Wellness Visit

## 2022-03-10 NOTE — Patient Instructions (Signed)
Health Maintenance, Male Adopting a healthy lifestyle and getting preventive care are important in promoting health and wellness. Ask your health care provider about: The right schedule for you to have regular tests and exams. Things you can do on your own to prevent diseases and keep yourself healthy. What should I know about diet, weight, and exercise? Eat a healthy diet  Eat a diet that includes plenty of vegetables, fruits, low-fat dairy products, and lean protein. Do not eat a lot of foods that are high in solid fats, added sugars, or sodium. Maintain a healthy weight Body mass index (BMI) is a measurement that can be used to identify possible weight problems. It estimates body fat based on height and weight. Your health care provider can help determine your BMI and help you achieve or maintain a healthy weight. Get regular exercise Get regular exercise. This is one of the most important things you can do for your health. Most adults should: Exercise for at least 150 minutes each week. The exercise should increase your heart rate and make you sweat (moderate-intensity exercise). Do strengthening exercises at least twice a week. This is in addition to the moderate-intensity exercise. Spend less time sitting. Even light physical activity can be beneficial. Watch cholesterol and blood lipids Have your blood tested for lipids and cholesterol at 85 years of age, then have this test every 5 years. You may need to have your cholesterol levels checked more often if: Your lipid or cholesterol levels are high. You are older than 85 years of age. You are at high risk for heart disease. What should I know about cancer screening? Many types of cancers can be detected early and may often be prevented. Depending on your health history and family history, you may need to have cancer screening at various ages. This may include screening for: Colorectal cancer. Prostate cancer. Skin cancer. Lung  cancer. What should I know about heart disease, diabetes, and high blood pressure? Blood pressure and heart disease High blood pressure causes heart disease and increases the risk of stroke. This is more likely to develop in people who have high blood pressure readings or are overweight. Talk with your health care provider about your target blood pressure readings. Have your blood pressure checked: Every 3-5 years if you are 18-39 years of age. Every year if you are 40 years old or older. If you are between the ages of 65 and 75 and are a current or former smoker, ask your health care provider if you should have a one-time screening for abdominal aortic aneurysm (AAA). Diabetes Have regular diabetes screenings. This checks your fasting blood sugar level. Have the screening done: Once every three years after age 45 if you are at a normal weight and have a low risk for diabetes. More often and at a younger age if you are overweight or have a high risk for diabetes. What should I know about preventing infection? Hepatitis B If you have a higher risk for hepatitis B, you should be screened for this virus. Talk with your health care provider to find out if you are at risk for hepatitis B infection. Hepatitis C Blood testing is recommended for: Everyone born from 1945 through 1965. Anyone with known risk factors for hepatitis C. Sexually transmitted infections (STIs) You should be screened each year for STIs, including gonorrhea and chlamydia, if: You are sexually active and are younger than 85 years of age. You are older than 85 years of age and your   health care provider tells you that you are at risk for this type of infection. Your sexual activity has changed since you were last screened, and you are at increased risk for chlamydia or gonorrhea. Ask your health care provider if you are at risk. Ask your health care provider about whether you are at high risk for HIV. Your health care provider  may recommend a prescription medicine to help prevent HIV infection. If you choose to take medicine to prevent HIV, you should first get tested for HIV. You should then be tested every 3 months for as long as you are taking the medicine. Follow these instructions at home: Alcohol use Do not drink alcohol if your health care provider tells you not to drink. If you drink alcohol: Limit how much you have to 0-2 drinks a day. Know how much alcohol is in your drink. In the [place] one drink equals one 12 oz bottle of beer (355 mL), one 5 oz glass of wine (148 mL), or one 1 oz glass of hard liquor (44 mL). Lifestyle Do not use any products that contain nicotine or tobacco. These products include cigarettes, chewing tobacco, and vaping devices, such as e-cigarettes. If you need help quitting, ask your health care provider. Do not use street drugs. Do not share needles. Ask your health care provider for help if you need support or information about quitting drugs. General instructions Schedule regular health, dental, and eye exams. Stay current with your vaccines. Tell your health care provider if: You often feel depressed. You have ever been abused or do not feel safe at home. Summary Adopting a healthy lifestyle and getting preventive care are important in promoting health and wellness. Follow your health care provider's instructions about healthy diet, exercising, and getting tested or screened for diseases. Follow your health care provider's instructions on monitoring your cholesterol and blood pressure. This information is not intended to replace advice given to you by your health care provider. Make sure you discuss any questions you have with your health care provider. Document Revised: 08/12/2020 Document Reviewed: 08/12/2020 Elsevier Patient Education  2023 Elsevier Inc.  

## 2022-03-24 ENCOUNTER — Telehealth: Payer: Self-pay

## 2022-03-24 NOTE — Telephone Encounter (Signed)
Alert received from [employer]:  ILR alert for pause at 2135, 4 sec. Patient with hx of nocturnal pauses with longest documented 5 sec.   Outreach made to Pt's wife.  Per wife Pt sleeps frequently throughout the day and he was sleeping at time of this pause.  Per wife she is unable to give a sleep schedule for the Pt because he is asleep so often.  She is ok with calling to determine if Pt was awake or asleep when pauses are found.  Advised to call if any concerns.

## 2022-03-31 ENCOUNTER — Telehealth: Payer: Self-pay

## 2022-03-31 NOTE — Telephone Encounter (Signed)
Received the following transmission alert:  ILR alert for pause, 4 sec at 1439. Hx of pauses (nocturnal) with longest 5 sec. Presenting AF with rates 40-50's, onset 01/28/22 (persistent). Histogram shows HR's <50 since at least October (avg). [place]- Eliquis. Routing for further review- JJB  Spoke with wife.  She states that during the 4 second episode on 12/23 patient was likely awake, but he didn't report anything out of the ordinary.  He has been feeling fine, no reports of dizziness, lightheadedness or near syncopal events.  Will forward to Dr. Quentin Ore and have highlighted average low v-rates below in histogram.

## 2022-04-02 NOTE — Telephone Encounter (Signed)
Reviewed with Dr. Quentin Ore on 04/01/22.  He is aware of frequent 4 sec or less pauses and that all are not during the night. Patient does often doze during the day. He is asymptomatic. Will continue to monitor, nothing further at this time.

## 2022-04-10 ENCOUNTER — Ambulatory Visit: Payer: Medicare Other | Admitting: Family Medicine

## 2022-04-10 ENCOUNTER — Encounter: Payer: Self-pay | Admitting: Family Medicine

## 2022-04-10 VITALS — BP 122/68 | HR 45 | Ht 68.0 in | Wt 185.0 lb

## 2022-04-10 DIAGNOSIS — M159 Polyosteoarthritis, unspecified: Secondary | ICD-10-CM | POA: Diagnosis not present

## 2022-04-10 NOTE — Progress Notes (Signed)
Subjective:    Patient ID: [name], male    DOB: 09/08/1934, 88 y.o.   MRN: [id number]  Rathana Viveros Sr. is a 88 y.o. male presenting on 04/10/2022 for disability paperwork   HPI  Osteoarthritis multiple joints History of degenerative arthritis in several joints including knees, back, hips. He uses cane for ambulation. He has episodic flares joint pain that limit his walking and mobility. He will need renewal on handicap placard today      03/10/2022   10:35 AM 06/27/2021    9:52 AM 12/17/2020   11:01 AM  Depression screen PHQ 2/9  Decreased Interest 0 0 0  Down, Depressed, Hopeless 0 0 0  PHQ - 2 Score 0 0 0  Altered sleeping  0   Tired, decreased energy  0   Change in appetite  0   Feeling bad or failure about yourself   0   Trouble concentrating  0   Moving slowly or fidgety/restless  0   Suicidal thoughts  0   PHQ-9 Score  0   Difficult doing work/chores  Not difficult at all     Social History   Tobacco Use   Smoking status: Former    Types: Cigarettes    Quit date: 04/06/1970    Years since quitting: 52.0   Smokeless tobacco: Former  Scientific laboratory technician Use: Never used  Substance Use Topics   Alcohol use: Yes    Alcohol/week: 0.0 standard drinks of alcohol    Comment: Consumes alcohol on occasion   Drug use: No    Review of Systems Per HPI unless specifically indicated above     Objective:    BP 122/68   Pulse (!) 45   Ht '5\' 8"'$  (1.727 m)   Wt 185 lb (83.9 kg)   SpO2 98%   BMI 28.13 kg/m   Wt Readings from Last 3 Encounters:  04/10/22 185 lb (83.9 kg)  11/14/21 194 lb 9.6 oz (88.3 kg)  10/22/21 190 lb (86.2 kg)    Physical Exam Vitals and nursing note reviewed.  Constitutional:      General: He is not in acute distress.    Appearance: Normal appearance. He is well-developed. He is not diaphoretic.     Comments: Well-appearing, comfortable, cooperative  HENT:     Head: Normocephalic and atraumatic.  Eyes:     General:         Right eye: No discharge.        Left eye: No discharge.     Conjunctiva/sclera: Conjunctivae normal.  Cardiovascular:     Rate and Rhythm: Normal rate.  Pulmonary:     Effort: Pulmonary effort is normal.  Musculoskeletal:     Comments: Using cane. Able to stand from seated position with some minor assistance. Reduced ROM forward flex and hip flex  Skin:    General: Skin is warm and dry.     Findings: No erythema or rash.  Neurological:     Mental Status: He is alert and oriented to person, place, and time.  Psychiatric:        Mood and Affect: Mood normal.        Behavior: Behavior normal.        Thought Content: Thought content normal.     Comments: Well groomed, good eye contact, normal speech and thoughts    Results for orders placed or performed in visit on 02/02/22  CUP [place] CHECK  Result Value Ref  Range   Date Time Interrogation Session [phone]    Pulse Generator Manufacturer MERM    Pulse Gen Model [id number] Lysbeth Galas Gen Serial Number [id number]    Clinic Name [facility]    Implantable Pulse Generator Type ICM/ILR    Implantable Pulse Generator Implant Date [id number]    Zone Setting Status 573220    Zone Setting Status 755011    Zone Setting Status Inactive    Zone Setting Status 755011    Zone Setting Status 755011    Battery Status OK       Assessment & Plan:   Problem List Items Addressed This Visit   None Visit Diagnoses     Primary osteoarthritis involving multiple joints    -  Primary       OA/DJD Multiple joints Limits his mobility Uses cane for assistance with ambulation Continue current treatment plan. No changes. Completed Handicap placard form today  No orders of the defined types were placed in this encounter.     Follow up plan: Return if symptoms worsen or fail to improve.  Nobie Putnam, [facility] 04/10/2022, 2:29 PM

## 2022-04-10 NOTE — Patient Instructions (Signed)
° °  Please schedule a Follow-up Appointment to: No follow-ups on file. ° °If you have any other questions or concerns, please feel free to call the office or send a message through MyChart. You may also schedule an earlier appointment if necessary. ° °Additionally, you may be receiving a survey about your experience at our office within a few days to 1 week by e-mail or mail. We value your feedback. ° °Venetia Prewitt, DO °[facility], [facility] °

## 2022-04-13 ENCOUNTER — Ambulatory Visit (INDEPENDENT_AMBULATORY_CARE_PROVIDER_SITE_OTHER): Payer: No Typology Code available for payment source

## 2022-04-13 DIAGNOSIS — I4819 Other persistent atrial fibrillation: Secondary | ICD-10-CM

## 2022-04-14 LAB — CUP PACEART REMOTE DEVICE CHECK
Date Time Interrogation Session: 20240109090710
Implantable Pulse Generator Implant Date: 20220922

## 2022-05-14 ENCOUNTER — Encounter (HOSPITAL_COMMUNITY): Payer: Self-pay | Admitting: *Deleted

## 2022-05-15 NOTE — Progress Notes (Signed)
Carelink Summary Report / Loop Recorder 

## 2022-05-18 ENCOUNTER — Ambulatory Visit: Payer: No Typology Code available for payment source

## 2022-05-18 DIAGNOSIS — R55 Syncope and collapse: Secondary | ICD-10-CM | POA: Diagnosis not present

## 2022-05-19 LAB — CUP PACEART REMOTE DEVICE CHECK
Date Time Interrogation Session: 20240212103632
Implantable Pulse Generator Implant Date: 20220922

## 2022-06-22 ENCOUNTER — Ambulatory Visit (INDEPENDENT_AMBULATORY_CARE_PROVIDER_SITE_OTHER): Payer: No Typology Code available for payment source

## 2022-06-22 DIAGNOSIS — R55 Syncope and collapse: Secondary | ICD-10-CM | POA: Diagnosis not present

## 2022-06-23 LAB — CUP PACEART REMOTE DEVICE CHECK
Date Time Interrogation Session: 20240317231343
Implantable Pulse Generator Implant Date: 20220922

## 2022-06-30 NOTE — Progress Notes (Signed)
Carelink Summary Report / Loop Recorder 

## 2022-07-06 ENCOUNTER — Telehealth: Payer: Self-pay

## 2022-07-06 NOTE — Telephone Encounter (Signed)
Following alert received from [employer] received for 1 new pause 3/30 @ 09:48, 5sec in duration, not a conversion pause - route to triage high alert per protocol.  Spoke to patients wife -on [place], advised patient was asleep during the time of pause. Patient also has felt well and no complaints. Advised continue to monitor. Voiced understanding.

## 2022-07-27 ENCOUNTER — Ambulatory Visit: Payer: No Typology Code available for payment source

## 2022-07-27 DIAGNOSIS — R55 Syncope and collapse: Secondary | ICD-10-CM | POA: Diagnosis not present

## 2022-07-27 LAB — CUP PACEART REMOTE DEVICE CHECK
Date Time Interrogation Session: 20240419230554
Implantable Pulse Generator Implant Date: 20220922

## 2022-07-31 NOTE — Progress Notes (Signed)
Carelink Summary Report / Loop Recorder 

## 2022-08-26 ENCOUNTER — Ambulatory Visit (INDEPENDENT_AMBULATORY_CARE_PROVIDER_SITE_OTHER): Payer: No Typology Code available for payment source

## 2022-08-26 DIAGNOSIS — R55 Syncope and collapse: Secondary | ICD-10-CM

## 2022-08-27 LAB — CUP PACEART REMOTE DEVICE CHECK
Date Time Interrogation Session: 20240522230525
Implantable Pulse Generator Implant Date: 20220922

## 2022-09-01 ENCOUNTER — Emergency Department: Payer: No Typology Code available for payment source

## 2022-09-01 ENCOUNTER — Other Ambulatory Visit: Payer: Self-pay

## 2022-09-01 ENCOUNTER — Emergency Department
Admission: EM | Admit: 2022-09-01 | Discharge: 2022-09-02 | Disposition: A | Discharge status: Home / Self Care | Payer: No Typology Code available for payment source | Attending: Emergency Medicine | Admitting: Emergency Medicine

## 2022-09-01 ENCOUNTER — Encounter: Payer: Self-pay | Admitting: Emergency Medicine

## 2022-09-01 DIAGNOSIS — I129 Hypertensive chronic kidney disease with stage 1 through stage 4 chronic kidney disease, or unspecified chronic kidney disease: Secondary | ICD-10-CM | POA: Diagnosis not present

## 2022-09-01 DIAGNOSIS — F0390 Unspecified dementia without behavioral disturbance: Secondary | ICD-10-CM | POA: Diagnosis not present

## 2022-09-01 DIAGNOSIS — E860 Dehydration: Secondary | ICD-10-CM | POA: Diagnosis not present

## 2022-09-01 DIAGNOSIS — N189 Chronic kidney disease, unspecified: Secondary | ICD-10-CM | POA: Diagnosis not present

## 2022-09-01 DIAGNOSIS — R531 Weakness: Secondary | ICD-10-CM | POA: Diagnosis present

## 2022-09-01 LAB — CBC
HCT: 46.1 % (ref 39.0–52.0)
Hemoglobin: 15.2 g/dL (ref 13.0–17.0)
MCH: 30.6 pg (ref 26.0–34.0)
MCHC: 33 g/dL (ref 30.0–36.0)
MCV: 92.9 fL (ref 80.0–100.0)
Platelets: 201 10*3/uL (ref 150–400)
RBC: 4.96 MIL/uL (ref 4.22–5.81)
RDW: 13.9 % (ref 11.5–15.5)
WBC: 8 10*3/uL (ref 4.0–10.5)
nRBC: 0 % (ref 0.0–0.2)

## 2022-09-01 LAB — BASIC METABOLIC PANEL
Anion gap: 12 (ref 5–15)
BUN: 21 mg/dL (ref 8–23)
CO2: 23 mmol/L (ref 22–32)
Calcium: 9.1 mg/dL (ref 8.9–10.3)
Chloride: 100 mmol/L (ref 98–111)
Creatinine, Ser: 1.45 mg/dL — ABNORMAL HIGH (ref 0.61–1.24)
GFR, Estimated: 47 mL/min — ABNORMAL LOW (ref >60)
Glucose, Bld: 121 mg/dL — ABNORMAL HIGH (ref 70–99)
Potassium: 4.3 mmol/L (ref 3.5–5.1)
Sodium: 135 mmol/L (ref 135–145)

## 2022-09-01 LAB — TROPONIN I (HIGH SENSITIVITY): Troponin I (High Sensitivity): 27 ng/L — ABNORMAL HIGH (ref <18)

## 2022-09-01 MED ORDER — ONDANSETRON HCL 4 MG/2ML IJ SOLN
4.0000 mg | Freq: Once | INTRAMUSCULAR | Status: AC
  Administered 2022-09-02: 4 mg via INTRAVENOUS
  Filled 2022-09-01: qty 2

## 2022-09-01 MED ORDER — LACTATED RINGERS IV BOLUS
1000.0000 mL | Freq: Once | INTRAVENOUS | Status: AC
  Administered 2022-09-02: 1000 mL via INTRAVENOUS

## 2022-09-01 MED ORDER — PANTOPRAZOLE SODIUM 40 MG IV SOLR
40.0000 mg | Freq: Once | INTRAVENOUS | Status: AC
  Administered 2022-09-02: 40 mg via INTRAVENOUS
  Filled 2022-09-01: qty 10

## 2022-09-01 NOTE — ED Triage Notes (Signed)
Patient to ED via POV for generalized weakness x1 week. Wife reports patient not wanting to taken any medications because he thinks it is "making him cripple." Hx of dementia. Not wanting to eat or drink much at home. Wife reports fall at home last PM. Larey Seat out of bed- on blood thinners (when taking).

## 2022-09-01 NOTE — Progress Notes (Signed)
Carelink Summary Report / Loop Recorder 

## 2022-09-01 NOTE — ED Provider Notes (Signed)
[facility] Provider Note    Event Date/Time   First MD Initiated Contact with Patient 09/01/22 2325     (approximate)   History   Chief Complaint: Weakness   HPI  Elijah Stansbery Sr. is a 88 y.o. male with a past history of GERD, hypertension, CKD, dementia, myasthenia gravis who was brought to the ED due to generalized weakness for the past week, decreased oral intake, not wanting to take his medications.  Tends to eat scrambled eggs in the morning, drinks only milk throughout the day and refuses water.  Wife is concerned he is not eating enough.  No fever, no pain.  He had a minor fall at home where he slid out of bed to the floor.  No head injury     Physical Exam   Triage Vital Signs: ED Triage Vitals  Enc Vitals Group     BP 09/01/22 1656 120/76     Pulse Rate 09/01/22 1656 88     Resp 09/01/22 1656 16     Temp 09/01/22 1656 98.1 F (36.7 C)     Temp Source 09/01/22 1656 Oral     SpO2 09/01/22 1656 97 %     Weight 09/01/22 1656 165 lb (74.8 kg)     Height 09/01/22 1656 5\' 8"  (1.727 m)     Head Circumference --      Peak Flow --      Pain Score 09/01/22 1701 0     Pain Loc --      Pain Edu? --      Excl. in GC? --     Most recent vital signs: Vitals:   09/02/22 0400 09/02/22 0424  BP: 110/60 131/70  Pulse:  73  Resp:  20  Temp:    SpO2:  98%    General: Awake, no distress.  CV:  Good peripheral perfusion.  Atrial fibrillation, rate of about 60 Resp:  Normal effort.  Clear to auscultation bilaterally Abd:  No distention.  Soft nontender Other:  Dry mucous membranes.  No signs of trauma.   ED Results / Procedures / Treatments   Labs (all labs ordered are listed, but only abnormal results are displayed) Labs Reviewed  BASIC METABOLIC PANEL - Abnormal; Notable for the following components:      Result Value   Glucose, Bld 121 (*)    Creatinine, Ser 1.45 (*)    GFR, Estimated 47 (*)    All other components within normal  limits  URINALYSIS, ROUTINE W REFLEX MICROSCOPIC - Abnormal; Notable for the following components:   Color, Urine YELLOW (*)    APPearance HAZY (*)    Hgb urine dipstick MODERATE (*)    Ketones, ur 20 (*)    All other components within normal limits  HEPATIC FUNCTION PANEL - Abnormal; Notable for the following components:   Total Bilirubin 1.4 (*)    Indirect Bilirubin 1.2 (*)    All other components within normal limits  TROPONIN I (HIGH SENSITIVITY) - Abnormal; Notable for the following components:   Troponin I (High Sensitivity) 27 (*)    All other components within normal limits  CBC  LIPASE, BLOOD  CBG MONITORING, ED     EKG Interpreted by me Atrial fibrillation rate of 77.  Right axis, poor R wave progression.  Normal ST segments and T waves.  No ischemic changes.   RADIOLOGY Chest x-ray interpreted by me, appears unremarkable.  Radiology report reviewed.  CT head unremarkable  PROCEDURES:  Procedures   MEDICATIONS ORDERED IN ED: Medications  lactated ringers bolus 1,000 mL (0 mLs Intravenous Stopped 09/02/22 0055)  ondansetron (ZOFRAN) injection 4 mg (4 mg Intravenous Given 09/02/22 0002)  pantoprazole (PROTONIX) injection 40 mg (40 mg Intravenous Given 09/02/22 0002)     IMPRESSION / MDM / ASSESSMENT AND PLAN / ED COURSE  I reviewed the triage vital signs and the nursing notes.  DDx: Dehydration, electrolyte abnormality, anemia, intracranial mass, intracranial hemorrhage, pneumonia, UTI, advanced dementia  Patient's presentation is most consistent with acute presentation with potential threat to life or bodily function.  Patient presents with decreased oral intake, and perceived generalized weakness.  At baseline patient does minimal ambulation according to his wife, mostly just remains in bed throughout the day.  No focal symptoms or exam findings.  Workup is unremarkable.  Patient given IV fluids.   Clinical Course as of 09/02/22 0513  Wed Sep 02, 2022   1610 Orthostatics normal, confirmed by myself personally.  Patient was able to stand for several minutes with me and walked to the toilet and voided spontaneously and walked back to the bed.  Denies any dizziness.  Tolerating oral fluids. [PS]    Clinical Course User Index [PS] Sharman Cheek, MD     FINAL CLINICAL IMPRESSION(S) / ED DIAGNOSES   Final diagnoses:  Dehydration  Chronic dementia (HCC)     Rx / DC Orders   ED Discharge Orders     None        Note:  This document was prepared using Dragon voice recognition software and may include unintentional dictation errors.   Sharman Cheek, MD 09/02/22 [phone]

## 2022-09-02 LAB — URINALYSIS, ROUTINE W REFLEX MICROSCOPIC
Bacteria, UA: NONE SEEN
Bilirubin Urine: NEGATIVE
Glucose, UA: NEGATIVE mg/dL
Ketones, ur: 20 mg/dL — AB
Leukocytes,Ua: NEGATIVE
Nitrite: NEGATIVE
Protein, ur: NEGATIVE mg/dL
RBC / HPF: >50 RBC/hpf (ref 0–5)
Specific Gravity, Urine: 1.021 (ref 1.005–1.030)
Squamous Epithelial / HPF: NONE SEEN /HPF (ref 0–5)
pH: 5 (ref 5.0–8.0)

## 2022-09-02 LAB — HEPATIC FUNCTION PANEL
ALT: 21 U/L (ref 0–44)
AST: 26 U/L (ref 15–41)
Albumin: 4.1 g/dL (ref 3.5–5.0)
Alkaline Phosphatase: 57 U/L (ref 38–126)
Bilirubin, Direct: 0.2 mg/dL (ref 0.0–0.2)
Indirect Bilirubin: 1.2 mg/dL — ABNORMAL HIGH (ref 0.3–0.9)
Total Bilirubin: 1.4 mg/dL — ABNORMAL HIGH (ref 0.3–1.2)
Total Protein: 7 g/dL (ref 6.5–8.1)

## 2022-09-02 LAB — LIPASE, BLOOD: Lipase: 30 U/L (ref 11–51)

## 2022-09-02 NOTE — ED Notes (Signed)
Pt able to tolerate water.

## 2022-09-02 NOTE — ED Notes (Signed)
I&O cath performed by this RN with the assistance of the patients spouse, EDT Research officer, trade union), and Security. Pt was making threatening remarks which is what prompted security's presence. This RN able to obtain specimen which was labeled and sent to the lab.

## 2022-09-03 ENCOUNTER — Emergency Department: Payer: No Typology Code available for payment source

## 2022-09-03 ENCOUNTER — Emergency Department
Admission: EM | Admit: 2022-09-03 | Discharge: 2022-10-05 | Disposition: E | Payer: No Typology Code available for payment source | Attending: Emergency Medicine | Admitting: Emergency Medicine

## 2022-09-03 ENCOUNTER — Encounter: Payer: Self-pay | Admitting: Emergency Medicine

## 2022-09-03 ENCOUNTER — Other Ambulatory Visit: Payer: Self-pay

## 2022-09-03 ENCOUNTER — Telehealth: Payer: Self-pay

## 2022-09-03 DIAGNOSIS — I4891 Unspecified atrial fibrillation: Secondary | ICD-10-CM | POA: Diagnosis not present

## 2022-09-03 DIAGNOSIS — R531 Weakness: Secondary | ICD-10-CM | POA: Diagnosis not present

## 2022-09-03 DIAGNOSIS — W1811XA Fall from or off toilet without subsequent striking against object, initial encounter: Secondary | ICD-10-CM | POA: Insufficient documentation

## 2022-09-03 DIAGNOSIS — R4182 Altered mental status, unspecified: Secondary | ICD-10-CM | POA: Diagnosis present

## 2022-09-03 DIAGNOSIS — F0390 Unspecified dementia without behavioral disturbance: Secondary | ICD-10-CM | POA: Insufficient documentation

## 2022-09-03 DIAGNOSIS — Z7901 Long term (current) use of anticoagulants: Secondary | ICD-10-CM | POA: Insufficient documentation

## 2022-09-03 DIAGNOSIS — R109 Unspecified abdominal pain: Secondary | ICD-10-CM | POA: Diagnosis not present

## 2022-09-03 DIAGNOSIS — I10 Essential (primary) hypertension: Secondary | ICD-10-CM | POA: Insufficient documentation

## 2022-09-03 LAB — CBC WITH DIFFERENTIAL/PLATELET
Abs Immature Granulocytes: 0.03 10*3/uL (ref 0.00–0.07)
Basophils Absolute: 0.1 10*3/uL (ref 0.0–0.1)
Basophils Relative: 1 %
Eosinophils Absolute: 0.1 10*3/uL (ref 0.0–0.5)
Eosinophils Relative: 1 %
HCT: 35.4 % — ABNORMAL LOW (ref 39.0–52.0)
Hemoglobin: 11.9 g/dL — ABNORMAL LOW (ref 13.0–17.0)
Immature Granulocytes: 0 %
Lymphocytes Relative: 33 %
Lymphs Abs: 3.2 10*3/uL (ref 0.7–4.0)
MCH: 31.3 pg (ref 26.0–34.0)
MCHC: 33.6 g/dL (ref 30.0–36.0)
MCV: 93.2 fL (ref 80.0–100.0)
Monocytes Absolute: 0.9 10*3/uL (ref 0.1–1.0)
Monocytes Relative: 9 %
Neutro Abs: 5.5 10*3/uL (ref 1.7–7.7)
Neutrophils Relative %: 56 %
Platelets: 190 10*3/uL (ref 150–400)
RBC: 3.8 MIL/uL — ABNORMAL LOW (ref 4.22–5.81)
RDW: 13.4 % (ref 11.5–15.5)
WBC: 9.7 10*3/uL (ref 4.0–10.5)
nRBC: 0 % (ref 0.0–0.2)

## 2022-09-03 LAB — COMPREHENSIVE METABOLIC PANEL
ALT: 14 U/L (ref 0–44)
AST: 23 U/L (ref 15–41)
Albumin: 3.2 g/dL — ABNORMAL LOW (ref 3.5–5.0)
Alkaline Phosphatase: 46 U/L (ref 38–126)
Anion gap: 11 (ref 5–15)
BUN: 58 mg/dL — ABNORMAL HIGH (ref 8–23)
CO2: 20 mmol/L — ABNORMAL LOW (ref 22–32)
Calcium: 8.7 mg/dL — ABNORMAL LOW (ref 8.9–10.3)
Chloride: 105 mmol/L (ref 98–111)
Creatinine, Ser: 1.43 mg/dL — ABNORMAL HIGH (ref 0.61–1.24)
GFR, Estimated: 48 mL/min — ABNORMAL LOW (ref >60)
Glucose, Bld: 124 mg/dL — ABNORMAL HIGH (ref 70–99)
Potassium: 3.8 mmol/L (ref 3.5–5.1)
Sodium: 136 mmol/L (ref 135–145)
Total Bilirubin: 0.6 mg/dL (ref 0.3–1.2)
Total Protein: 5.8 g/dL — ABNORMAL LOW (ref 6.5–8.1)

## 2022-09-03 MED ORDER — IOHEXOL 300 MG/ML  SOLN
100.0000 mL | Freq: Once | INTRAMUSCULAR | Status: AC | PRN
  Administered 2022-09-03: 100 mL via INTRAVENOUS

## 2022-09-03 NOTE — Transitions of Care (Post Inpatient/ED Visit) (Signed)
   09/03/2022  Name: Elijah Fontaine Sr. MRN: [id number] DOB: 1934/06/21  Today's TOC FU Call Status: Today's TOC FU Call Status:: Unsuccessul Call (1st Attempt) Unsuccessful Call (1st Attempt) Date: 09/03/22 (Called back and received voicemail.  Left message) TOC FU Call Complete Date: 09/03/22 (Patient wife could not talk at this time.  Patient had fallen again and they were trying to get him up.  She asked to call her back in 1 hour.)  Attempted to reach the patient regarding the most recent Inpatient/ED visit.  Follow Up Plan: Additional outreach attempts will be made to reach the patient to complete the Transitions of Care (Post Inpatient/ED visit) call.   Oneal Grout, [facility]) [facility] [phone]

## 2022-09-03 NOTE — ED Triage Notes (Addendum)
EMS responded to call at 1530 for fall. EMS called back for fall off toilet and when he found he was altered compared to previous . CBG 122. Speech is jarbled and not able to say anything that makes sense. Pt is reported to have had 2 falls today. EMS unsure if hit head or on blood thinners.  Eliquis found in bag. Pt in afib. Pt also on steroids for myasthenia gravis.  Per wife his baseline as of a few days ago is walks slowly with cane. Was weakening and was seen here a few days and was seen here possible dehydration. No UTI. Pt was discharged home and was still not walking per baseline and sleeping a lot. Pt woke up this morning and had loose BM. He slipped out of bed and EMS came and put him back in bed earlier today. He got up and went to bathroom later on and wife heard him get out of bed. He was found to leaning forward on toilet but had not fallen. Wife then called 911. Pt moves all extremities without deficits. Speech is incoherent but is able to yell stop and fight against staff.

## 2022-09-03 NOTE — ED Provider Notes (Signed)
[facility] Provider Note    Event Date/Time   First MD Initiated Contact with Patient 09/04/2022 2230     (approximate)   History   Fall   HPI  Elijah Antczak Sr. is a 88 y.o. male with history of GERD, HTN, dementia, myasthenia gravis, A-fib on Eliquis presenting to the emergency department for evaluation of altered mental status.  Around 330pm today, EMS was called out to the patient's home for a fall.  They helped him back into bed.  Unknown head strike.  Around 1030pm, patient was found hunched over on the toilet, no fall noted.  Wife called 911.  Patient does have a history of dementia, wife reports that at baseline he can answer some basic questions.  He did arrive as a code stroke activation given change in mental status  Patient was signed in our ER 2 days ago for generalized weakness and decreased oral intake.  He was thought to be possibly dehydrated with findings related to his chronic dementia and was discharged.     Physical Exam   Triage Vital Signs: ED Triage Vitals [08/10/2022 2225]  Enc Vitals Group     BP      Pulse      Resp      Temp      Temp src      SpO2      Weight      Height      Head Circumference      Peak Flow      Pain Score 0     Pain Loc      Pain Edu?      Excl. in GC?     Most recent vital signs: Vitals:   08/29/2022 2300  BP: 103/66  Pulse: 75  Resp: (!) 21  SpO2: 96%   General: Awake, regards me but is nonverbal CV:  Regular rate, good peripheral perfusion.  Resp:  Lungs clear, unlabored respirations.  Abd:  Soft, nondistended, does appear to have some mild generalized discomfort without focality with palpation Neuro:  Symmetric facial movement, speaking, not following commands, but withdrawing and localizing in all extremities, pupils equal and reactive with normal extraocular movements   ED Results / Procedures / Treatments   Labs (all labs ordered are listed, but only abnormal results are  displayed) Labs Reviewed  CBC WITH DIFFERENTIAL/PLATELET - Abnormal; Notable for the following components:      Result Value   RBC 3.80 (*)    Hemoglobin 11.9 (*)    HCT 35.4 (*)    All other components within normal limits  COMPREHENSIVE METABOLIC PANEL - Abnormal; Notable for the following components:   CO2 20 (*)    Glucose, Bld 124 (*)    BUN 58 (*)    Creatinine, Ser 1.43 (*)    Calcium 8.7 (*)    Total Protein 5.8 (*)    Albumin 3.2 (*)    GFR, Estimated 48 (*)    All other components within normal limits  TROPONIN I (HIGH SENSITIVITY) - Abnormal; Notable for the following components:   Troponin I (High Sensitivity) 36 (*)    All other components within normal limits  URINALYSIS, W/ REFLEX TO CULTURE (INFECTION SUSPECTED)     EKG EKG independently reviewed interpreted by myself (ER attending) demonstrates:    RADIOLOGY Imaging independently reviewed and interpreted by myself demonstrates:    PROCEDURES:  Critical Care performed: No  Procedures   MEDICATIONS ORDERED IN ED:  Medications  iohexol (OMNIPAQUE) 300 MG/ML solution 100 mL (100 mLs Intravenous Contrast Given 08/08/2022 2338)     IMPRESSION / MDM / ASSESSMENT AND PLAN / ED COURSE  I reviewed the triage vital signs and the nursing notes.  Differential diagnosis includes, but is not limited to, decompensation of underlying dementia, intracranial bleed, acute intra-abdominal process, anemia, electrolyte abnormality  Patient's presentation is most consistent with acute presentation with potential threat to life or bodily function.  88 year old male presenting with worsened mental status in the setting of dementia with recent falls.  No focal deficits with unclear last known well, no indication for code stroke activation.  Will obtain labs, urine, CT imaging to further evaluate.  Signed out to oncoming provider at 2300 pending completion of workup, reevaluation, and disposition.     FINAL CLINICAL  IMPRESSION(S) / ED DIAGNOSES   Final diagnoses:  Altered mental status, unspecified altered mental status type     Rx / DC Orders   ED Discharge Orders     None        Note:  This document was prepared using Dragon voice recognition software and may include unintentional dictation errors.   Trinna Post, MD 09/04/22 [phone]

## 2022-09-03 NOTE — Transitions of Care (Post Inpatient/ED Visit) (Signed)
   09/03/2022  Name: Elijah Landavazo Sr. MRN: [id number] DOB: 02-Oct-1934  Today's TOC FU Call Status: Today's TOC FU Call Status:: Successful TOC FU Call Competed TOC FU Call Complete Date: 09/03/22 (Patient wife could not talk at this time.  Patient had fallen again and they were trying to get him up.  She asked to call her back in 1 hour.)  Transition Care Management Follow-up Telephone Call Date of Discharge: 09/02/22 Discharge Facility: [facility]) Type of Discharge: Emergency Department How have you been since you were released from the hospital?: Worse   Elijah Jackson, [facility]) Elijah Jackson [facility] [phone]

## 2022-09-03 NOTE — ED Notes (Signed)
Patient transported to CT 

## 2022-09-03 NOTE — ED Notes (Signed)
Code [place] called in the field by EMS

## 2022-09-03 NOTE — Progress Notes (Signed)
Chap responded to Code Stroke, Pt in the room with medical team evaluating him at the moment. No family. Elijah Jackson is available as needed.   09/03/22 2200  Spiritual Encounters  Type of Visit Initial  Care provided to: Patient  Referral source Code page  Reason for visit Code  OnCall Visit Yes  Spiritual Framework  Presenting Themes Significant life change  Interventions  Spiritual Care Interventions Made Compassionate presence;Mindfulness intervention  Intervention Outcomes  Outcomes Connection to values and goals of care;Reduced isolation  Spiritual Care Plan  Spiritual Care Issues Still Outstanding No further spiritual care needs at this time (see row info)

## 2022-09-04 LAB — URINALYSIS, W/ REFLEX TO CULTURE (INFECTION SUSPECTED)
Bacteria, UA: NONE SEEN
Bilirubin Urine: NEGATIVE
Glucose, UA: NEGATIVE mg/dL
Hgb urine dipstick: NEGATIVE
Ketones, ur: 5 mg/dL — AB
Leukocytes,Ua: NEGATIVE
Nitrite: NEGATIVE
Protein, ur: NEGATIVE mg/dL
Specific Gravity, Urine: 1.034 — ABNORMAL HIGH (ref 1.005–1.030)
Squamous Epithelial / HPF: NONE SEEN /HPF (ref 0–5)
WBC, UA: NONE SEEN WBC/hpf (ref 0–5)
pH: 5 (ref 5.0–8.0)

## 2022-09-04 LAB — TROPONIN I (HIGH SENSITIVITY): Troponin I (High Sensitivity): 36 ng/L — ABNORMAL HIGH (ref <18)

## 2022-09-04 MED ORDER — PYRIDOSTIGMINE BROMIDE 60 MG PO TABS
60.0000 mg | ORAL_TABLET | Freq: Two times a day (BID) | ORAL | Status: DC
  Administered 2022-09-04 – 2022-09-05 (×4): 60 mg via ORAL
  Filled 2022-09-04 (×5): qty 1

## 2022-09-04 MED ORDER — DONEPEZIL HCL 5 MG PO TABS
10.0000 mg | ORAL_TABLET | Freq: Every day | ORAL | Status: DC
  Administered 2022-09-04 – 2022-09-05 (×2): 10 mg via ORAL
  Filled 2022-09-04 (×2): qty 2

## 2022-09-04 MED ORDER — PREDNISONE 10 MG PO TABS
5.0000 mg | ORAL_TABLET | Freq: Every day | ORAL | Status: DC
  Administered 2022-09-04 – 2022-09-05 (×2): 5 mg via ORAL
  Filled 2022-09-04 (×2): qty 1

## 2022-09-04 MED ORDER — MEMANTINE HCL 5 MG PO TABS
20.0000 mg | ORAL_TABLET | Freq: Every day | ORAL | Status: DC
  Administered 2022-09-04 – 2022-09-05 (×2): 20 mg via ORAL
  Filled 2022-09-04 (×2): qty 4

## 2022-09-04 MED ORDER — PREDNISONE 1 MG PO TABS
2.0000 mg | ORAL_TABLET | Freq: Every day | ORAL | Status: DC
  Administered 2022-09-04 – 2022-09-05 (×2): 2 mg via ORAL
  Filled 2022-09-04 (×3): qty 2

## 2022-09-04 MED ORDER — APIXABAN 5 MG PO TABS
5.0000 mg | ORAL_TABLET | Freq: Two times a day (BID) | ORAL | Status: DC
  Administered 2022-09-04 – 2022-09-05 (×4): 5 mg via ORAL
  Filled 2022-09-04 (×4): qty 1

## 2022-09-04 MED ORDER — BOOST PO LIQD
237.0000 mL | Freq: Two times a day (BID) | ORAL | Status: DC
  Filled 2022-09-04: qty 237

## 2022-09-04 MED ORDER — PANTOPRAZOLE SODIUM 40 MG PO TBEC
40.0000 mg | DELAYED_RELEASE_TABLET | Freq: Every day | ORAL | Status: DC
  Administered 2022-09-04 – 2022-09-05 (×2): 40 mg via ORAL
  Filled 2022-09-04 (×2): qty 1

## 2022-09-04 NOTE — ED Notes (Signed)
Wife given gold ring with jem that was found on floor by this RN. Ring in patient belongings cup

## 2022-09-04 NOTE — NC FL2 (Signed)
[place] MEDICAID FL2 LEVEL OF CARE FORM     IDENTIFICATION  Patient Name: Elijah Davidheiser Sr. Birthdate: 03-09-1935 Sex: male Admission Date (Current Location): 08/28/2022  [place] and [place] Number:  Chiropodist and Address:  [facility], [address] [place], [place] [address]      Provider Number: [id number]  Attending Physician Name and Address:  No att. 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) 04/22/2021   CAP (community acquired pneumonia) 04/05/2021   Stage 3a chronic kidney disease (HCC)    Orthostatic hypotension    AF (paroxysmal atrial fibrillation) (HCC)    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    Dementia without behavioral disturbance, psychotic disturbance, mood disturbance, or anxiety (HCC)    Pneumonia due to COVID-19 virus    Syncope and collapse 03/31/2018   Left-sided headache 01/25/2017   OSA (obstructive sleep apnea) 12/24/2016   Diarrhea 12/24/2016   Squamous cell carcinoma 09/07/2016   Cavitating mass of lung 04/17/2016   Gastritis 04/17/2016   Recurrent pulmonary emboli (HCC) 04/15/2016   Essential hypertension 04/14/2016   HLD (hyperlipidemia) 04/14/2016   GERD (gastroesophageal reflux disease) 04/14/2016   Community acquired pneumonia 04/14/2016   Hypokalemia 04/14/2016   Weakness 04/14/2016   Postcholecystectomy diarrhea 09/14/2013   Dizziness and giddiness 08/02/2013   Chronic cholecystitis 01/25/2013   Myasthenia gravis (HCC) 09/08/2012    Orientation RESPIRATION  BLADDER Height & Weight     Self  Normal Incontinent, External catheter Weight:   Height:     BEHAVIORAL SYMPTOMS/MOOD NEUROLOGICAL BOWEL NUTRITION STATUS      Continent Diet (see discharge summary)  AMBULATORY STATUS COMMUNICATION OF NEEDS Skin   Limited Assist Verbally Normal                       Personal Care Assistance Level of Assistance  Bathing, Feeding, Dressing, Total care Bathing Assistance: Limited assistance Feeding assistance: Limited assistance Dressing Assistance: Limited assistance Total Care Assistance: Limited assistance   Functional Limitations Info  Sight, Hearing, Speech Sight Info: Adequate Hearing Info: Adequate Speech Info: Adequate    SPECIAL CARE FACTORS FREQUENCY  PT (By licensed PT), OT (By licensed OT)     PT Frequency: min 4x weekly OT Frequency: min 4x weekly            Contractures Contractures Info: Not present    Additional Factors Info  Allergies, Code Status Code Status Info: full Allergies Info: Pravastatin  Simvastatin           Current Medications (09/04/2022):  This is the current hospital active medication list No current facility-administered medications for this encounter.   Current Outpatient Medications  Medication Sig Dispense Refill   donepezil (ARICEPT) 10 MG tablet Take 10 mg by mouth at bedtime.     memantine (NAMENDA) 10 MG tablet Take 20 mg by mouth at bedtime.     methocarbamol (ROBAXIN) 500 MG tablet TAKE ONE TABLET BY MOUTH THREE TIMES A DAY AS NEEDED FOR MUSCLE TENSION HEADACHES DURING THE DAY; MAY START WITH HALF TABS  omeprazole (PRILOSEC) 40 MG capsule Take 1 capsule (40 mg total) by mouth 2 (two) times daily before a meal. 60 capsule 0   pyridostigmine (MESTINON) 60 MG tablet Take 60 mg by mouth 2 (two) times daily.     apixaban (ELIQUIS) 5 MG TABS tablet Take 1 tablet (5 mg total) by mouth 2 (two) times daily. 60 tablet 0   furosemide (LASIX) 20 MG tablet Take 1 tablet (20 mg total) by mouth  daily as needed for edema or fluid. (Patient not taking: Reported on 09/04/2022) 30 tablet 0   lactose free nutrition (BOOST) LIQD Take 237 mLs by mouth 2 (two) times daily.     lidocaine (LIDODERM) 5 % Place 2 patches onto the skin daily as needed (pain). Remove & Discard patch within 12 hours or as directed by MD     predniSONE (DELTASONE) 1 MG tablet Take 2 mg by mouth daily with breakfast. (Take with 5mg  tablet to equal 7mg  total)     predniSONE (DELTASONE) 5 MG tablet Take 5 mg by mouth daily. (Take with 2mg  tablets to equal 7mg  total)       Discharge Medications: Please see discharge summary for a list of discharge medications.  Relevant Imaging Results:  Relevant Lab Results:   Additional Information SSN: [id number]  Darolyn Rua, LCSW

## 2022-09-04 NOTE — Evaluation (Addendum)
Physical Therapy Evaluation Patient Details Name: Elijah Egert Sr. MRN: [id number] DOB: January 09, 1935 Today's Date: 09/04/2022  History of Present Illness  Elijah Mickley Sr. is a 88 y.o. male with history of GERD, HTN, dementia, myasthenia gravis, A-fib on Eliquis presenting to the emergency department for evaluation of altered mental status. Addendum: MD assessment includes AMS and recent falls.  Clinical Impression  Pt was found lying in bed upon arrival. He demonstrated unwillingness or inability to provide history when asked, so his wife answered most questions regarding home setup/PLOF. Pt required mod assistance in order to go from supine<>sit, as well as mod assistance to perform sit<>stand. Upon standing pt required use of bilateral UE's on RW to maintain balance. Pt struggled to perform standing marches likely due to BLE strength weakness. Pt was instructed to attempt taking steps but was only able to take 1-2 very small steps forward and backward. He demonstrated a heightened fear of falling while standing/ambulating. His wife describes that his functional independence has declined as of the last few weeks. Pt will benefit from continued PT services upon discharge to safely address deficits listed in patient problem list for decreased caregiver assistance and eventual return to PLOF.      Recommendations for follow up therapy are one component of a multi-disciplinary discharge planning process, led by the attending physician.  Recommendations may be updated based on patient status, additional functional criteria and insurance authorization.  Follow Up Recommendations Can patient physically be transported by private vehicle: No     Assistance Recommended at Discharge Frequent or constant Supervision/Assistance  Patient can return home with the following  A lot of help with walking and/or transfers    Equipment Recommendations  Addendum: TBD at next venue of care  Recommendations for  Other Services       Functional Status Assessment Patient has had a recent decline in their functional status and/or demonstrates limited ability to make significant improvements in function in a reasonable and predictable amount of time     Precautions / Restrictions Precautions Precautions: Fall Restrictions Weight Bearing Restrictions: No      Mobility  Bed Mobility Overal bed mobility: Needs Assistance Bed Mobility: Sit to Supine       Sit to supine: Mod assist   General bed mobility comments: Pt required PT assistance in order to get legs fully on bed, as well assist x2 to get up to [facility]; heavy use of bedrails to roll and come to sitting EOB.    Transfers Overall transfer level: Needs assistance Equipment used: Rolling walker (2 wheels) Transfers: Sit to/from Stand Sit to Stand: Mod assist           General transfer comment: Pt required use of hands and help from PT to attain standing position on higher elevated ED bed    Ambulation/Gait Ambulation/Gait assistance: Mod assist, Min assist, +2 physical assistance   Assistive device: Rolling walker (2 wheels) Gait Pattern/deviations: Shuffle       General Gait Details: Pt able to take 1-2 very small, shuffling, effortful steps at EOB with +2 min/mod assist for stability  Stairs            Wheelchair Mobility    Modified Rankin (Stroke Patients Only)       Balance Overall balance assessment: Needs assistance Sitting-balance support: No upper extremity supported Sitting balance-Leahy Scale: Fair     Standing balance support: Bilateral upper extremity supported Standing balance-Leahy Scale: Poor  Pertinent Vitals/Pain Pain Assessment Pain Assessment: No/denies pain    Home Living Family/patient expects to be discharged to:: Private residence Living Arrangements: Spouse/significant other;Other (Comment) (Wife and wife's sister) Available Help at  Discharge: Family Type of Home: House Home Access: Stairs to enter Entrance Stairs-Rails: Can reach both Entrance Stairs-Number of Steps: 4   Home Layout: One level Home Equipment: Cane - single Librarian, academic (2 wheels);Shower seat Additional Comments: Pt at risk for more falls at home; recommending SNF/rehab    Prior Function Prior Level of Function : Independent/Modified Independent             Mobility Comments: Independent with ADL's and mobility at home until the last few weeks       Hand Dominance   Dominant Hand: Right    Extremity/Trunk Assessment        Lower Extremity Assessment Lower Extremity Assessment: Generalized weakness       Communication   Communication: No difficulties (Pt unwilling/unable to respond to most questions; could follow intermittent one-step commands)  Cognition Arousal/Alertness: Lethargic (Hard to determine if decreased alertness was due to dementia or other reason) Behavior During Therapy: Agitated Overall Cognitive Status: History of cognitive impairments - at baseline                                 General Comments: Wife present to help answer questions        General Comments      Exercises     Assessment/Plan    PT Assessment Patient needs continued PT services  PT Problem List Decreased strength;Decreased activity tolerance;Decreased balance;Decreased mobility;Decreased safety awareness;Decreased cognition       PT Treatment Interventions Gait training;Balance training;DME instruction;Stair training;Functional mobility training;Therapeutic activities;Therapeutic exercise;Patient/family education    PT Goals (Current goals can be found in the Care Plan section)  Acute Rehab PT Goals Patient Stated Goal: "Increase strength" PT Goal Formulation: With patient/family Time For Goal Achievement: 09/17/22 Potential to Achieve Goals: Fair    Frequency Min 2X/week     Co-evaluation                AM-PAC PT "6 Clicks" Mobility  Outcome Measure Help needed turning from your back to your side while in a flat bed without using bedrails?: A Lot Help needed moving from lying on your back to sitting on the side of a flat bed without using bedrails?: A Lot Help needed moving to and from a bed to a chair (including a wheelchair)?: A Lot Help needed standing up from a chair using your arms (e.g., wheelchair or bedside chair)?: A Lot Help needed to walk in hospital room?: A Lot Help needed climbing 3-5 steps with a railing? : A Lot 6 Click Score: 12    End of Session Equipment Utilized During Treatment: Gait belt Activity Tolerance: Patient tolerated treatment well;Patient limited by lethargy (Pt had poor safety awareness due to AMS) Patient left: in bed;with call bell/phone within reach;with bed alarm set;with family/visitor present Nurse Communication: Mobility status PT Visit Diagnosis: Unsteadiness on feet (R26.81);Repeated falls (R29.6);Muscle weakness (generalized) (M62.81);Other abnormalities of gait and mobility (R26.89)    Time: 1610-9604 PT Time Calculation (min) (ACUTE ONLY): 29 min   Charges:   PT Evaluation $PT Eval Low Complexity: 1 Low          Timothy Crutchfield, SPT   Timothy Crutchfield 09/04/2022, 12:15 PM  This entire patient treatment session was directly  supervised by this clinician.  The note, response to treatment and overall treatment plan has been reviewed and this clinician agrees with the information provided.  Ovidio Hanger PT, DPT 09/04/22, 1:51 PM

## 2022-09-04 NOTE — ED Notes (Signed)
Lab called to add on trop 

## 2022-09-04 NOTE — ED Notes (Signed)
Patient given two warm blankets   

## 2022-09-04 NOTE — ED Notes (Signed)
Patient cleaned, new brief and pad placed. Patient pulled up in bed.

## 2022-09-04 NOTE — ED Notes (Signed)
PT sleeping

## 2022-09-04 NOTE — ED Notes (Signed)
Patient moved to hospital bed. Patients brief changed and cleaned. Patient fed chocolate ice cream. Purwick placed on patient.

## 2022-09-04 NOTE — ED Notes (Signed)
Strawberry yogurt ordered for patient

## 2022-09-04 NOTE — ED Notes (Signed)
Secretary asked to order hospital bed for patient

## 2022-09-04 NOTE — ED Notes (Signed)
Pt had removed condom cath and urinated in bed. Changed and cleaned. Brief applied.

## 2022-09-04 NOTE — ED Provider Notes (Addendum)
-----------------------------------------   1:02 AM on 09/04/2022 -----------------------------------------   CT Head/Cervical Spine/Abd/Pel: No ICH, no acute osseous injury, no intra-abdominal abnormalities Troponin stable from prior visit.  I had an extended discussion with the patient and his family members and expressed my concern of repeat fall should he go home tonight.  He does not meet criteria for medical admission; however, I did offer social work consult for evaluation of home health needs versus rehab/SNF, etc.  They agreed to this option.  Will consult social work and physical therapy for evaluation and disposition.  Will ask pharmacy tech to reconcile patient's home medications.    ----------------------------------------- 6:21 AM on 09/04/2022 -----------------------------------------   Home medications have not yet been reconciled by pharmacy.  Care be transferred for the oncoming provider at change of shift who will place orders for home medications once they have been verified.   Irean Hong, MD 09/04/22 [phone]

## 2022-09-04 NOTE — TOC Initial Note (Signed)
Transition of Care (TOC) - Initial/Assessment Note    Patient Details  Name: Elijah Jackson. MRN: [id number] Date of Birth: Dec 29, 1934  Transition of Care [facility]) CM/SW Contact:    Darolyn Rua, LCSW Phone Number: 09/04/2022, 11:36 AM  Clinical Narrative:                  Patient and spouse agreeable to SNF PT recommendations, preference for [facility] if possible. CSW has sent out referrals pending bed offers at this time.    Expected Discharge Plan: Skilled Nursing Facility Barriers to Discharge: Continued Medical Work up   Patient Goals and CMS Choice Patient states their goals for this hospitalization and ongoing recovery are:: to go home CMS Medicare.gov Compare Post Acute Care list provided to:: Patient Represenative (must comment) (spouse crystal) Choice offered to / list presented to : Spouse      Expected Discharge Plan and Services       Living arrangements for the past 2 months: Single Family Home                                      Prior Living Arrangements/Services Living arrangements for the past 2 months: Single Family Home Lives with:: Spouse                   Activities of Daily Living      Permission Sought/Granted                  Emotional Assessment Appearance:: Appears stated age Attitude/Demeanor/Rapport: Gracious Affect (typically observed): Calm Orientation: : Oriented to Self, Oriented to Place, Oriented to  Time, Oriented to Situation Alcohol / Substance Use: Not Applicable Psych Involvement: No (comment)  Admission diagnosis:  Code stroke Patient Active Problem List   Diagnosis Date Noted   Unilateral primary osteoarthritis, left knee 12/03/2021   Venous insufficiency of both lower extremities 09/15/2021   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) 04/22/2021   CAP (community acquired pneumonia) 04/05/2021   Stage 3a chronic kidney disease (HCC)    Orthostatic hypotension    AF  (paroxysmal atrial fibrillation) (HCC)    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    Dementia without behavioral disturbance, psychotic disturbance, mood disturbance, or anxiety (HCC)    Pneumonia due to COVID-19 virus    Syncope and collapse 03/31/2018   Left-sided headache 01/25/2017   OSA (obstructive sleep apnea) 12/24/2016   Diarrhea 12/24/2016   Squamous cell carcinoma 09/07/2016   Cavitating mass of lung 04/17/2016   Gastritis 04/17/2016   Recurrent pulmonary emboli (HCC) 04/15/2016   Essential hypertension 04/14/2016   HLD (hyperlipidemia) 04/14/2016   GERD (gastroesophageal reflux disease) 04/14/2016   Community acquired pneumonia 04/14/2016   Hypokalemia 04/14/2016   Weakness 04/14/2016   Postcholecystectomy diarrhea 09/14/2013   Dizziness and giddiness 08/02/2013   Chronic cholecystitis 01/25/2013   Myasthenia gravis (HCC) 09/08/2012   PCP:  Smitty Cords, DO Pharmacy:   [facility] - [place], [place] - [address] [place] [address] Phone: [phone] Fax: [phone]     Social Determinants of Health (SDOH) Social History: SDOH Screenings   Food Insecurity: No Food Insecurity (03/10/2022)  Housing: Low Risk  (03/10/2022)  Transportation Needs: No Transportation Needs (03/10/2022)  Utilities: Not At Risk (03/10/2022)  Alcohol Screen: Low Risk  (03/10/2022)  Depression (PHQ2-9): Low Risk  (03/10/2022)  Financial Resource Strain: Low Risk  (03/10/2022)  Physical Activity: Inactive (03/10/2022)  Social Connections: Moderately Isolated (03/10/2022)  Stress: No Stress Concern Present (03/10/2022)  Tobacco Use: Medium Risk (08/20/2022)   SDOH Interventions:     Readmission Risk Interventions     No data to display

## 2022-09-05 MED ORDER — OXYCODONE-ACETAMINOPHEN 5-325 MG PO TABS
1.0000 | ORAL_TABLET | Freq: Once | ORAL | Status: AC
  Administered 2022-09-05: 1 via ORAL
  Filled 2022-09-05: qty 1

## 2022-09-05 MED ORDER — IBUPROFEN 400 MG PO TABS
400.0000 mg | ORAL_TABLET | Freq: Once | ORAL | Status: AC
  Administered 2022-09-05: 400 mg via ORAL
  Filled 2022-09-05: qty 1

## 2022-09-05 MED ORDER — TRAZODONE HCL 50 MG PO TABS
50.0000 mg | ORAL_TABLET | Freq: Every day | ORAL | Status: DC
  Administered 2022-09-05 (×2): 50 mg via ORAL
  Filled 2022-09-05 (×2): qty 1

## 2022-09-05 MED ORDER — LACTATED RINGERS IV BOLUS
1000.0000 mL | Freq: Once | INTRAVENOUS | Status: AC
  Administered 2022-09-05: 1000 mL via INTRAVENOUS

## 2022-09-05 MED ORDER — LACTATED RINGERS IV SOLN: INTRAVENOUS | Status: DC

## 2022-09-05 NOTE — TOC Progression Note (Signed)
Transition of Care (TOC) - Progression Note    Patient Details  Name: Elijah Comunale Sr. MRN: [id number] Date of Birth: [birth year]  Transition of Care [facility]) CM/SW Contact  Susa Simmonds, [place] Phone Number: 09/05/2022, 11:08 AM  Clinical Narrative: CSW spoke with patients wife, Elijah Jackson to see if patient had any other insurance than the [place]. Patients wife stated that patient has Medicare and an united healthcare supplement. CSW contacted patients nurse to have registration add the insurance to patients chart. CSW informed patients wife that if patient has Medicare A/B that CSW cannot use that insurance for rehab. Patient will need a 3 night inpatient qualifying stay or a recent admission in the last 30 days. Patients wife voiced understanding and was fine using patients VA insurance. CSW reviewed patients chart and patient doesn't have a recent admission. Patients wife stated he has only been at [facility] and no other in the last 30 days. Patient is also not [facility].  Registration will confirm what secondary insurance patient has.   CSW contacted [place]. CSW was told by the AOD that there is no one on the weekends to review SNF referrals. CSW was told to call the point of contact for SNF referrals at the [place] on Monday.   [facility] contacted [facility] who [facility]. Tammy believes the [place] healthcare is the [employer] and not the supplement. [facility] does not take [employer]. Insurance verification is pending at this time.     Expected Discharge Plan: Skilled Nursing Facility Barriers to Discharge: Continued Medical Work up  Expected Discharge Plan and Services       Living arrangements for the past 2 months: Single Family Home                                       Social Determinants of Health (SDOH) Interventions SDOH Screenings   Food Insecurity: No Food Insecurity (03/10/2022)  Housing: Low Risk  (03/10/2022)  Transportation  Needs: No Transportation Needs (03/10/2022)  Utilities: Not At Risk (03/10/2022)  Alcohol Screen: Low Risk  (03/10/2022)  Depression (PHQ2-9): Low Risk  (03/10/2022)  Financial Resource Strain: Low Risk  (03/10/2022)  Physical Activity: Inactive (03/10/2022)  Social Connections: Moderately Isolated (03/10/2022)  Stress: No Stress Concern Present (03/10/2022)  Tobacco Use: Medium Risk (08/26/2022)    Readmission Risk Interventions     No data to display

## 2022-09-05 DEATH — deceased

## 2022-09-06 ENCOUNTER — Emergency Department: Payer: No Typology Code available for payment source

## 2022-09-06 ENCOUNTER — Other Ambulatory Visit: Payer: Self-pay

## 2022-09-06 LAB — BLOOD GAS, ARTERIAL
Acid-base deficit: 24.1 mmol/L — ABNORMAL HIGH (ref 0.0–2.0)
Bicarbonate: 7.3 mmol/L — ABNORMAL LOW (ref 20.0–28.0)
FIO2: 100 %
MECHVT: 500 mL
Mechanical Rate: 20
O2 Saturation: 98.9 %
PEEP: 5 cmH2O
Patient temperature: 37
pCO2 arterial: 33 mmHg (ref 32–48)
pH, Arterial: 6.95 — CL (ref 7.35–7.45)
pO2, Arterial: 418 mmHg — ABNORMAL HIGH (ref 83–108)

## 2022-09-06 LAB — CBG MONITORING, ED: Glucose-Capillary: 168 mg/dL — ABNORMAL HIGH (ref 70–99)

## 2022-09-06 MED ORDER — ETOMIDATE 2 MG/ML IV SOLN
INTRAVENOUS | Status: AC | PRN
  Administered 2022-09-06: 10 mg via INTRAVENOUS

## 2022-09-06 MED ORDER — ROCURONIUM BROMIDE 50 MG/5ML IV SOLN
INTRAVENOUS | Status: AC | PRN
  Administered 2022-09-06: 110 mg via INTRAVENOUS

## 2022-09-06 MED ORDER — ROCURONIUM BROMIDE 10 MG/ML (PF) SYRINGE: 110.0000 mg | PREFILLED_SYRINGE | Freq: Once | INTRAVENOUS | Status: DC

## 2022-09-06 MED ORDER — SODIUM CHLORIDE 0.9 % IV BOLUS: 1000.0000 mL | Freq: Once | INTRAVENOUS | Status: DC

## 2022-09-06 MED ORDER — ETOMIDATE 2 MG/ML IV SOLN
INTRAVENOUS | Status: AC
  Filled 2022-09-06: qty 10

## 2022-09-06 MED ORDER — ROCURONIUM BROMIDE 10 MG/ML (PF) SYRINGE
PREFILLED_SYRINGE | INTRAVENOUS | Status: AC
  Filled 2022-09-06: qty 10

## 2022-09-06 MED ORDER — MIDAZOLAM-SODIUM CHLORIDE 100-0.9 MG/100ML-% IV SOLN
INTRAVENOUS | Status: AC
  Filled 2022-09-06: qty 100

## 2022-09-06 MED ORDER — SODIUM CHLORIDE 0.9 % IV SOLN: 250.0000 mL | INTRAVENOUS | Status: DC

## 2022-09-06 MED ORDER — MIDAZOLAM-SODIUM CHLORIDE 100-0.9 MG/100ML-% IV SOLN: 0.5000 mg/h | INTRAVENOUS | Status: DC

## 2022-09-06 MED ORDER — NOREPINEPHRINE 4 MG/250ML-% IV SOLN
2.0000 ug/min | INTRAVENOUS | Status: DC
  Administered 2022-09-06: 10 ug/min via INTRAVENOUS

## 2022-09-06 MED ORDER — FENTANYL 2500MCG IN NS 250ML (10MCG/ML) PREMIX INFUSION: 0.0000 ug/h | INTRAVENOUS | Status: DC

## 2022-09-06 MED ORDER — NOREPINEPHRINE 4 MG/250ML-% IV SOLN
INTRAVENOUS | Status: AC
  Filled 2022-09-06: qty 250

## 2022-09-06 MED ORDER — FENTANYL 2500MCG IN NS 250ML (10MCG/ML) PREMIX INFUSION
INTRAVENOUS | Status: AC
  Filled 2022-09-06: qty 250

## 2022-09-06 MED ORDER — PHENYLEPHRINE HCL-NACL 20-0.9 MG/250ML-% IV SOLN
25.0000 ug/min | INTRAVENOUS | Status: DC
  Filled 2022-09-06: qty 250

## 2022-09-06 MED ORDER — EPINEPHRINE 1 MG/10ML IJ SOSY
PREFILLED_SYRINGE | INTRAMUSCULAR | Status: AC | PRN
  Administered 2022-09-06: 1 mg via INTRAVENOUS

## 2022-09-06 MED ORDER — DOPAMINE-DEXTROSE 3.2-5 MG/ML-% IV SOLN
INTRAVENOUS | Status: AC
  Administered 2022-09-06: 10 ug/kg/min via INTRAVENOUS
  Filled 2022-09-06: qty 250

## 2022-09-06 MED ORDER — DOPAMINE-DEXTROSE 3.2-5 MG/ML-% IV SOLN: 0.0000 ug/kg/min | INTRAVENOUS | Status: DC

## 2022-09-06 MED ORDER — NOREPINEPHRINE 4 MG/250ML-% IV SOLN
INTRAVENOUS | Status: AC | PRN
  Administered 2022-09-06: 15 ug/min via INTRAVENOUS

## 2022-09-06 MED ORDER — SODIUM CHLORIDE 0.9 % IV BOLUS
500.0000 mL | Freq: Once | INTRAVENOUS | Status: AC
  Administered 2022-09-06: 500 mL via INTRAVENOUS

## 2022-09-06 MED ORDER — ENSURE ENLIVE PO LIQD: 237.0000 mL | Freq: Two times a day (BID) | ORAL | Status: DC

## 2022-09-06 MED ORDER — ETOMIDATE 2 MG/ML IV SOLN
15.0000 mg | Freq: Once | INTRAVENOUS | Status: DC
  Filled 2022-09-06: qty 10

## 2022-09-07 ENCOUNTER — Telehealth: Payer: Self-pay | Admitting: Cardiology

## 2022-09-07 NOTE — Telephone Encounter (Signed)
Pt's wife would like a callback regarding what to do with pt's device and loop recorder being that he has passed. Please advise

## 2022-09-08 ENCOUNTER — Telehealth: Payer: Self-pay

## 2022-09-08 NOTE — Telephone Encounter (Signed)
I gave the patient wife my deepest condolences. I ordered a return kit. She should receive it in 7-10 business days.

## 2022-09-08 NOTE — Telephone Encounter (Signed)
Copied from CRM [id number]. Topic: General - Other >> Sep 08, 2022  8:18 AM Everette C wrote: Reason for CRM: Dee with [facility] has called to request completion of the patient's death certificate   NCDAVE ID [id number]  Please contact further if needed

## 2022-09-08 NOTE — Telephone Encounter (Signed)
Completed [place] DAVE Death Certificate  Saralyn Pilar, DO [facility] 09/08/2022, 10:47 AM

## 2022-09-10 MED FILL — Medication: Qty: 1 | Status: AC

## 2022-09-16 NOTE — Addendum Note (Signed)
Addended by: Geralyn Flash D on: 09/16/2022 12:44 PM   Modules accepted: Level of Service

## 2022-09-16 NOTE — Progress Notes (Signed)
Carelink Summary Report / Loop Recorder 

## 2022-10-05 NOTE — ED Provider Notes (Signed)
Vitals:   09/21/2022 1105 09/05/2022 1110  BP: (!) 103/27 (!) 72/39  Pulse: 100   Resp:    Temp: (!) 94.8 F (34.9 C) (!) 94.9 F (34.9 C)  SpO2: 100%     Patient's blood pressure downtrending further.  He is currently maxed out on 2 pressors.  Third pressor support and additional fluid ordered.  Patient condition clearly decompensating despite ICU level care in the ED.   Sharyn Creamer, MD 10/04/2022 1131

## 2022-10-05 NOTE — ED Provider Notes (Signed)
CRITICAL CARE Performed by: Sharyn Creamer   Total critical care time: 40 minutes  Critical care time was exclusive of separately billable procedures and treating other patients.  Critical care was necessary to treat or prevent imminent or life-threatening deterioration.  Critical care was time spent personally by me on the following activities: development of treatment plan with patient and/or surrogate as well as nursing, discussions with consultants, evaluation of patient's response to treatment, examination of patient, obtaining history from patient or surrogate, ordering and performing treatments and interventions, ordering and review of laboratory studies, ordering and review of radiographic studies, pulse oximetry and re-evaluation of patient's condition.  Procedure Name: Intubation Date/Time: 09/08/2022 10:27 AM  Performed by: Sharyn Creamer, MDPre-anesthesia Checklist: Patient identified, Patient being monitored, Emergency Drugs available, Timeout performed and Suction available Oxygen Delivery Method: Non-rebreather mask Preoxygenation: Pre-oxygenation with 100% oxygen Induction Type: Rapid sequence Ventilation: Mask ventilation without difficulty Laryngoscope Size: Glidescope and 3 Grade View: Grade II Tube type: Subglottic suction tube Tube size: 7.5 mm Number of attempts: 1 Placement Confirmation: ETT inserted through vocal cords under direct vision, CO2 detector and Breath sounds checked- equal and bilateral Secured at: 25 cm Tube secured with: ETT holder Dental Injury: Teeth and Oropharynx as per pre-operative assessment  Comments: Notable white frothy secretions noted from trachea required suctioning.  No complication during intubation.  Good end-tidal waveform on waveform capnography as well as proceeding with color change capnography.    CPR  Date/Time: 09/19/2022 10:28 AM  Performed by: Sharyn Creamer, MD Authorized by: Sharyn Creamer, MD  CPR Procedure Details:      Amount  of time prior to administration of ACLS/BLS (minutes):  0   ACLS/BLS initiated by EMS: No     CPR/ACLS performed in the ED: Yes     Duration of CPR (minutes):  4   Outcome: ROSC obtained    CPR performed via ACLS guidelines under my direct supervision.  See RN documentation for details including defibrillator use, medications, doses and timing. Comments:     Approximately 5 minutes postintubation the patient was noted to have hypotension, nursing uptitrating Levophed and providing fluid bolus.  However the patient then had a brief PEA arrest.  Approximately 4 minutes of compressions, 1 rhythm check with PEA followed by pulse present.  Ongoing critical care including pressor support, titration of medications, fluid bolusing and further workup ongoing at this time.     Sharyn Creamer, MD 10/03/2022 1029

## 2022-10-05 NOTE — ED Notes (Signed)
Pt family at bedside and requested RN believing pt had passed. This RN checked pt carotid, femoral and radial with no pulse present. Pt is comfort care.  Pt pronounced death at this time of 12:30.   Will inform MD Quale and RN Roselyn Meier called for family

## 2022-10-05 NOTE — ED Notes (Signed)
Pt in room, resting comfortably with eyes closed at this time.  Call light in reach.  Male pure wick in place.  Pt's brief checked and is clean and dry at this time.

## 2022-10-05 NOTE — ED Provider Notes (Signed)
Patient noted to have decreased responsiveness and increased work of breathing by nurse.  I saw and evaluated the patient, and he appears to have central rhonchi with unresponsiveness.  He does not react to loud verbal or touch stimuli.  He presently reports to have increasing lethargy during yesterday's shift and today is to the point of severe unresponsiveness.  He is tachypneic.  I called and I spoke with the patient's daughter Trula Ore.  Affirmed his goals of care are to be full code.  And updated her on worsening situation.  Chest x-ray interpreted by me at the bedside, I do not see evidence of acute process that would lead to severe increased work of breathing at this time.  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  Will proceed with RSI intubation given worsening status unresponsiveness as well as tachypnea and evidence of distress.  Glucose is normal.  Further workup including CBC EKG metabolic panel troponin BNP are ordered and pending.  CT head PENDING (too unstable to endure CT as of 1130AM) Postintubation chest x-ra  Dr. Cipriano Bunker (ICU) at bedside as of 1130AM   Sharyn Creamer, MD 09/17/2022 1205

## 2022-10-05 NOTE — ED Provider Notes (Signed)
Discussed with daughter Foye Clock.  ICU physician had spoken with her, she is the power of attorney and this is verified with the patient's wife who is also at the bedside.  Both the patient's healthcare power of attorney as well as his wife at the bedside are advising that they wish for withdrawal of care and to be made comfort measures.  I discussed this with his healthcare power of attorney, Trula Ore.  She is understanding and agreeable with taking him off the ventilator stopping all life support medications, performing no CPR, and allowing the patient to to pass in peace.  Wife at the bedside also in agreement.  At this time plan terminal extubation.  Patient POA Foye Clock and wife) also had same conversation with Dr. Karna Christmas  DNR/DNI moving to COMFORT   Sharyn Creamer, MD 09/30/2022 1151

## 2022-10-05 NOTE — Progress Notes (Signed)
Responded to page for pt passing. Met with family at bedside. Pt wife, daughter, sister in law and ex wife present. Offered prayer over pt and family. Offered compassionate presence and listening ear. Around if further needs arise.

## 2022-10-05 NOTE — ED Provider Notes (Signed)
Blood gas called verbally via respiratory therapy to me.  Patient has a severe metabolic with some associated respiratory acidosis.  Severe metabolic acidosis is noted though.  At this point the patient has had cardiac arrest, decompensation question if there could be proceeding cause for his severe acidosis.  At this juncture, patient tidal volume 500 rate of 20, respirating well on a reducing FiO2 based on blood gas.  Awaiting admission to ICU level service the patient clearly critical  Patient's wife at the bedside updated on change in status and critical nature   Sharyn Creamer, MD 09/05/2022 1108

## 2022-10-05 NOTE — ED Notes (Signed)
Report given to Kate RN

## 2022-10-05 NOTE — IPAL (Signed)
GOALS OF CARE FAMILY CONFERENCE   Current clinical status, hospital findings and medical plan was reviewed with family.   I spoke with POA daughter Belenda Cruise from [place]  Updated and notified of patients ongoing immediate critical medical problems.   Patient remains unresponsive acutely comatose    Patient is unable to breathe independently, unable to protect airway and unable to mobilize secretions.    Explained to family course of therapy and the modalities   Patient with Progressive multiorgan failure with high probability of a very minimal chance of meaningful recovery despite aggressive and optimal medical therapy.   Family is appreciative of care and relate understanding that patient is severely critically ill with anticipation of passing away during this hospitalization.   They have consented and agreed to DNR/DNI COMFORT MEASURES Code status AND WANT PATIENT EXTUBATED TO ALLOW TO PASS NATURALLY  Family are satisfied with Plan of action and management. All questions answered  Additional Critical Care time 35 mins    Vida Rigger, M.D.  Pulmonary & Critical Care Medicine  [facility]

## 2022-10-05 NOTE — ED Provider Notes (Signed)
Time of death 12:30 PM.  Pronounced by nurse.  I have also examined the patient at of his of 12:38 PM, he is currently asystolic on leads.  There is no spontaneous respirations for over 30 seconds, no heart tones for over 30 seconds.  His pupils are nonresponsive to corneal reflex, they are not responsive to light.  Patient has no spontaneous movements or respirations.  At this time the patient has died (time of death 1230p)  Patient's family at the bedside.  They advised most recent primary care is Dr. Michail Sermon.  He was scheduled to see a new primary care at the [facility], this Friday but has not yet done so.  At present time his doctor is Dr. Adrian Blackwater  Discussed case with ME by RN Jae Dire) and patient declined by ME. Anticipate move to [facility] Has PCP (Dr. Althea Charon), defer to PCP for death certificate completion     Sharyn Creamer, MD 09/11/2022 1323

## 2022-10-05 DEATH — deceased
